# Patient Record
Sex: Female | Born: 1940 | Race: White | Hispanic: No | Smoking: Former smoker
Health system: Southern US, Community
[De-identification: ages and names within clinical notes are randomized; demographics above are authoritative.]

## PROBLEM LIST (undated history)

## (undated) DIAGNOSIS — E119 Type 2 diabetes mellitus without complications: Secondary | ICD-10-CM

## (undated) DIAGNOSIS — S4290XA Fracture of unspecified shoulder girdle, part unspecified, initial encounter for closed fracture: Secondary | ICD-10-CM

## (undated) DIAGNOSIS — G20A1 Parkinson's disease without dyskinesia, without mention of fluctuations: Secondary | ICD-10-CM

## (undated) DIAGNOSIS — J189 Pneumonia, unspecified organism: Secondary | ICD-10-CM

## (undated) DIAGNOSIS — D649 Anemia, unspecified: Secondary | ICD-10-CM

## (undated) DIAGNOSIS — N3281 Overactive bladder: Secondary | ICD-10-CM

## (undated) DIAGNOSIS — F32A Depression, unspecified: Secondary | ICD-10-CM

## (undated) DIAGNOSIS — R29898 Other symptoms and signs involving the musculoskeletal system: Secondary | ICD-10-CM

## (undated) DIAGNOSIS — G20 Parkinson's disease: Secondary | ICD-10-CM

## (undated) DIAGNOSIS — F329 Major depressive disorder, single episode, unspecified: Secondary | ICD-10-CM

## (undated) DIAGNOSIS — E785 Hyperlipidemia, unspecified: Secondary | ICD-10-CM

## (undated) DIAGNOSIS — R2681 Unsteadiness on feet: Secondary | ICD-10-CM

## (undated) DIAGNOSIS — E049 Nontoxic goiter, unspecified: Secondary | ICD-10-CM

## (undated) DIAGNOSIS — U071 COVID-19: Secondary | ICD-10-CM

## (undated) DIAGNOSIS — M1990 Unspecified osteoarthritis, unspecified site: Secondary | ICD-10-CM

## (undated) HISTORY — DX: Parkinson's disease without dyskinesia, without mention of fluctuations: G20.A1

## (undated) HISTORY — DX: Overactive bladder: N32.81

## (undated) HISTORY — DX: Parkinson's disease: G20

## (undated) HISTORY — PX: NECK SURGERY: SHX720

## (undated) HISTORY — DX: Unsteadiness on feet: R26.81

## (undated) HISTORY — DX: Nontoxic goiter, unspecified: E04.9

## (undated) HISTORY — DX: COVID-19: U07.1

## (undated) HISTORY — PX: BLADDER REPAIR: SHX76

## (undated) HISTORY — DX: Depression, unspecified: F32.A

## (undated) HISTORY — DX: Fracture of unspecified shoulder girdle, part unspecified, initial encounter for closed fracture: S42.90XA

## (undated) HISTORY — DX: Major depressive disorder, single episode, unspecified: F32.9

## (undated) HISTORY — DX: Hyperlipidemia, unspecified: E78.5

## (undated) HISTORY — PX: ABDOMINAL HYSTERECTOMY: SHX81

---

## 1978-12-10 HISTORY — PX: ABDOMINAL HYSTERECTOMY: SHX81

## 1978-12-10 HISTORY — PX: BLADDER REPAIR: SHX76

## 2017-03-15 LAB — HM MAMMOGRAPHY

## 2018-11-21 ENCOUNTER — Other Ambulatory Visit: Payer: Self-pay

## 2018-11-21 ENCOUNTER — Emergency Department
Admission: EM | Admit: 2018-11-21 | Discharge: 2018-11-21 | Disposition: A | Discharge status: Home / Self Care | Payer: Medicare Other | Attending: Emergency Medicine | Admitting: Emergency Medicine

## 2018-11-21 ENCOUNTER — Emergency Department: Payer: Medicare Other

## 2018-11-21 DIAGNOSIS — E119 Type 2 diabetes mellitus without complications: Secondary | ICD-10-CM | POA: Diagnosis not present

## 2018-11-21 DIAGNOSIS — Y9389 Activity, other specified: Secondary | ICD-10-CM | POA: Insufficient documentation

## 2018-11-21 DIAGNOSIS — F17210 Nicotine dependence, cigarettes, uncomplicated: Secondary | ICD-10-CM | POA: Insufficient documentation

## 2018-11-21 DIAGNOSIS — S42202A Unspecified fracture of upper end of left humerus, initial encounter for closed fracture: Secondary | ICD-10-CM | POA: Diagnosis not present

## 2018-11-21 DIAGNOSIS — W01198A Fall on same level from slipping, tripping and stumbling with subsequent striking against other object, initial encounter: Secondary | ICD-10-CM | POA: Diagnosis not present

## 2018-11-21 DIAGNOSIS — Y92002 Bathroom of unspecified non-institutional (private) residence single-family (private) house as the place of occurrence of the external cause: Secondary | ICD-10-CM | POA: Diagnosis not present

## 2018-11-21 DIAGNOSIS — Y999 Unspecified external cause status: Secondary | ICD-10-CM | POA: Diagnosis not present

## 2018-11-21 DIAGNOSIS — W19XXXA Unspecified fall, initial encounter: Secondary | ICD-10-CM

## 2018-11-21 DIAGNOSIS — S4992XA Unspecified injury of left shoulder and upper arm, initial encounter: Secondary | ICD-10-CM | POA: Diagnosis present

## 2018-11-21 HISTORY — DX: Unspecified osteoarthritis, unspecified site: M19.90

## 2018-11-21 HISTORY — DX: Type 2 diabetes mellitus without complications: E11.9

## 2018-11-21 LAB — CBC WITH DIFFERENTIAL/PLATELET
Abs Immature Granulocytes: 0.03 10*3/uL (ref 0.00–0.07)
Basophils Absolute: 0 10*3/uL (ref 0.0–0.1)
Basophils Relative: 1 %
Eosinophils Absolute: 0 10*3/uL (ref 0.0–0.5)
Eosinophils Relative: 0 %
HCT: 39.8 % (ref 36.0–46.0)
Hemoglobin: 12.9 g/dL (ref 12.0–15.0)
Immature Granulocytes: 1 %
Lymphocytes Relative: 10 %
Lymphs Abs: 0.7 10*3/uL (ref 0.7–4.0)
MCH: 27.6 pg (ref 26.0–34.0)
MCHC: 32.4 g/dL (ref 30.0–36.0)
MCV: 85 fL (ref 80.0–100.0)
Monocytes Absolute: 0.3 10*3/uL (ref 0.1–1.0)
Monocytes Relative: 4 %
Neutro Abs: 5.4 10*3/uL (ref 1.7–7.7)
Neutrophils Relative %: 84 %
Platelets: 130 10*3/uL — ABNORMAL LOW (ref 150–400)
RBC: 4.68 MIL/uL (ref 3.87–5.11)
RDW: 14.7 % (ref 11.5–15.5)
WBC: 6.4 10*3/uL (ref 4.0–10.5)
nRBC: 0 % (ref 0.0–0.2)

## 2018-11-21 LAB — BASIC METABOLIC PANEL
Anion gap: 8 (ref 5–15)
BUN: 12 mg/dL (ref 8–23)
CO2: 25 mmol/L (ref 22–32)
Calcium: 9 mg/dL (ref 8.9–10.3)
Chloride: 104 mmol/L (ref 98–111)
Creatinine, Ser: 0.59 mg/dL (ref 0.44–1.00)
GFR calc Af Amer: >60 mL/min (ref >60)
GFR calc non Af Amer: >60 mL/min (ref >60)
Glucose, Bld: 146 mg/dL — ABNORMAL HIGH (ref 70–99)
Potassium: 4 mmol/L (ref 3.5–5.1)
Sodium: 137 mmol/L (ref 135–145)

## 2018-11-21 LAB — CK: Total CK: 49 U/L (ref 38–234)

## 2018-11-21 LAB — TROPONIN I: Troponin I: <0.03 ng/mL (ref <0.03)

## 2018-11-21 MED ORDER — MORPHINE SULFATE (PF) 4 MG/ML IV SOLN
4.0000 mg | Freq: Once | INTRAVENOUS | Status: AC
  Administered 2018-11-21: 4 mg via INTRAVENOUS
  Filled 2018-11-21: qty 1

## 2018-11-21 MED ORDER — OXYCODONE-ACETAMINOPHEN 5-325 MG PO TABS: 1.0000 | ORAL_TABLET | ORAL | 0 refills | Status: DC | PRN

## 2018-11-21 MED ORDER — DOCUSATE SODIUM 100 MG PO CAPS: 100.0000 mg | ORAL_CAPSULE | Freq: Every day | ORAL | 2 refills | Status: DC | PRN

## 2018-11-21 NOTE — Discharge Instructions (Addendum)
You have a fracture of your humerus bone, wear the splint and follow-up please with orthopedic surgeon first thing on Monday.  Take the pain medications as needed.  Do not drink or drive on pain medications be where pain medications can make you woozy.  We have offered to try to help establish home health care for you but you would prefer not to have that at this time which is certainly your choice.  We would recommend that you stay with family.  Please return to the emergency room for any new or worrisome symptoms.  Follow closely with primary care as well if you do not have one please call for the primary care number listed above

## 2018-11-21 NOTE — ED Triage Notes (Signed)
Pt from via ems for fall. Pt states she has problem walking like she is "running forward" and this morning "couldn't stop" herself.  Pain in left shoulder, 50 mcg fentanyl from ems. No obvious deformity per EMS. Pt aox4.  Pt denies blood thinner/hitting head during fall.

## 2018-11-21 NOTE — ED Notes (Signed)
Patient transported to X-ray 

## 2018-11-21 NOTE — ED Provider Notes (Addendum)
[facility] Emergency Department Provider Note  ____________________________________________   I have reviewed the triage vital signs and the nursing notes. Where available I have reviewed prior notes and, if possible and indicated, outside hospital notes.    HISTORY  Chief Complaint Fall    HPI Whitney [name] is a 77 y.o. female who presents today complaining of having fallen at home.  She has she states poor balance at baseline, she tripped and fell landing on her left shoulder.  She did not hit her head she did not pass out this was not a syncopal event she has no chest pain or shortness of breath no nausea no vomiting.  She has trouble with generalized weakness and lives by herself and was able to get off the floor, she was on the floor around 3 AM.  She denies any other injury, aside from her left shoulder which is painful.  Patient has received fentanyl from EMS.  No other complaints.  The pain is worse when she moves her arm, worse when she touches it nothing else makes it better or worse no other alleviating or aggravating factors no prior treatment no other complaints    Past Medical History:  Diagnosis Date  . Arthritis   . Diabetes mellitus without complication (HCC)     There are no active problems to display for this patient.   Past Surgical History:  Procedure Laterality Date  . ABDOMINAL HYSTERECTOMY    . NECK SURGERY      Prior to Admission medications   Not on File    Allergies Patient has no known allergies.  No family history on file.  Social History Social History   Tobacco Use  . Smoking status: Current Some Day Smoker    Packs/day: 0.10    Types: Cigarettes  . Smokeless tobacco: Never Used  Substance Use Topics  . Alcohol use: Never    Frequency: Never  . Drug use: Never    Review of Systems Constitutional: No fever/chills Eyes: No visual changes. ENT: No sore throat. No stiff neck no neck pain Cardiovascular:  Denies chest pain. Respiratory: Denies shortness of breath. Gastrointestinal:   no vomiting.  No diarrhea.  No constipation. Genitourinary: Negative for dysuria. Musculoskeletal: Negative lower extremity swelling Skin: Negative for rash. Neurological: Negative for severe headaches, focal weakness or numbness.   ____________________________________________   PHYSICAL EXAM:  VITAL SIGNS: ED Triage Vitals  Enc Vitals Group     BP 11/21/18 0948 (!) 152/96     Pulse Rate 11/21/18 0948 85     Resp 11/21/18 0948 20     Temp 11/21/18 0940 98 F (36.7 C)     Temp Source 11/21/18 0940 Oral     SpO2 11/21/18 0937 95 %     Weight 11/21/18 0940 165 lb (74.8 kg)     Height 11/21/18 0940 5\' 2"  (1.575 m)     Head Circumference --      Peak Flow --      Pain Score 11/21/18 0940 8     Pain Loc --      Pain Edu? --      Excl. in GC? --     Constitutional: Alert and oriented. Well appearing and in no acute distress. Eyes: Conjunctivae are normal Head: Atraumatic HEENT: No congestion/rhinnorhea. Mucous membranes are moist.  Oropharynx non-erythematous Neck:   Nontender with no meningismus, no masses, no stridor Cardiovascular: Normal rate, regular rhythm. Grossly normal heart sounds.  Good peripheral circulation. Respiratory:  Normal respiratory effort.  No retractions. Lungs CTAB. Abdominal: Soft and nontender. No distention. No guarding no rebound Back:  There is no focal tenderness or step off.  there is no midline tenderness there are no lesions noted. there is no CVA tenderness Musculoskeletal: No lower extremity tenderness, no deformity noted, to the left elbow or wrist or shoulder however it is tender to palpate on the humeral head.  There is no evidence of dislocation.  I can range it but it hurts.  She has good pulses compartments are soft, I do not detect any clavicular pain.  However, the pain that she describes is very reproducible with palpation.  No joint effusions, no DVT signs  strong distal pulses no edema Neurologic:  Normal speech and language. No gross focal neurologic deficits are appreciated.  Skin:  Skin is warm, dry and intact. No rash noted. Psychiatric: Mood and affect are normal. Speech and behavior are normal.  ____________________________________________   LABS (all labs ordered are listed, but only abnormal results are displayed)  Labs Reviewed  BASIC METABOLIC PANEL  CBC WITH DIFFERENTIAL/PLATELET  TROPONIN I  CK    Pertinent labs  results that were available during my care of the patient were reviewed by me and considered in my medical decision making (see chart for details). ____________________________________________  EKG  I personally interpreted any EKGs ordered by me or triage Sinus rhythm rate 77 bpm no acute ST elevation or depression, no no acute ischemic changes ____________________________________________  RADIOLOGY  Pertinent labs & imaging results that were available during my care of the patient were reviewed by me and considered in my medical decision making (see chart for details). If possible, patient and/or family made aware of any abnormal findings.  No results found. ____________________________________________    PROCEDURES  Procedure(s) performed: None  Procedures  Critical Care performed: None  ____________________________________________   INITIAL IMPRESSION / ASSESSMENT AND PLAN / ED COURSE  Pertinent labs & imaging results that were available during my care of the patient were reviewed by me and considered in my medical decision making (see chart for details).  Patient here with a non-syncopal fall with shoulder pain thereafter because she was on the ground for a while we will check a total CK, do not think this versus referred cardiac pain, and certainly is quite reproducible and associated with a joint we will obtain a x-ray of the shoulder and EKG as a precaution.  We will give her pain  medications, patient is in no acute distress otherwise and at her baseline according to family.  There is no indication that she is hit her head 7 hours ago, there is no evidence that she has a neck injury, I will avoid CT scan in this patient who is not on blood thinners  ----------------------------------------- 11:16 AM on 11/21/2018 -----------------------------------------  Patient is feeling much better after pain medication, she does have a proximal humerus fracture which is not likely going to require surgical intervention, will have her follow-up with orthopedic surgery, we are placing her in a sling.  No other injury noted no evidence of rhabdomyolysis, patient awake and alert.    ____________________________________________   FINAL CLINICAL IMPRESSION(S) / ED DIAGNOSES  Final diagnoses:  None      This chart was dictated using voice recognition software.  Despite best efforts to proofread,  errors can occur which can change meaning.      Jeanmarie PlantMcShane, Perris Conwell A, MD 11/21/18 1014    Jeanmarie PlantMcShane, Jolynne Spurgin A, MD 11/21/18  1117  

## 2019-01-13 ENCOUNTER — Encounter: Payer: Self-pay | Admitting: Family Medicine

## 2019-01-13 ENCOUNTER — Ambulatory Visit (INDEPENDENT_AMBULATORY_CARE_PROVIDER_SITE_OTHER): Payer: Medicare Other | Admitting: Family Medicine

## 2019-01-13 VITALS — BP 130/85 | HR 81 | Temp 98.2°F | Resp 16 | Ht 59.5 in | Wt 165.2 lb

## 2019-01-13 DIAGNOSIS — N3281 Overactive bladder: Secondary | ICD-10-CM | POA: Diagnosis not present

## 2019-01-13 DIAGNOSIS — M7989 Other specified soft tissue disorders: Secondary | ICD-10-CM | POA: Diagnosis not present

## 2019-01-13 DIAGNOSIS — E1169 Type 2 diabetes mellitus with other specified complication: Secondary | ICD-10-CM | POA: Insufficient documentation

## 2019-01-13 DIAGNOSIS — E669 Obesity, unspecified: Secondary | ICD-10-CM | POA: Insufficient documentation

## 2019-01-13 DIAGNOSIS — Z5181 Encounter for therapeutic drug level monitoring: Secondary | ICD-10-CM

## 2019-01-13 DIAGNOSIS — R269 Unspecified abnormalities of gait and mobility: Secondary | ICD-10-CM

## 2019-01-13 DIAGNOSIS — E049 Nontoxic goiter, unspecified: Secondary | ICD-10-CM | POA: Diagnosis not present

## 2019-01-13 DIAGNOSIS — H5789 Other specified disorders of eye and adnexa: Secondary | ICD-10-CM

## 2019-01-13 DIAGNOSIS — H00012 Hordeolum externum right lower eyelid: Secondary | ICD-10-CM

## 2019-01-13 DIAGNOSIS — R350 Frequency of micturition: Secondary | ICD-10-CM

## 2019-01-13 MED ORDER — BUPROPION HCL ER (XL) 150 MG PO TB24: 150.0000 mg | ORAL_TABLET | Freq: Every day | ORAL | 1 refills | Status: DC

## 2019-01-13 NOTE — Assessment & Plan Note (Addendum)
I am here to help her lose in the future, but explained not our focus today on first visit

## 2019-01-13 NOTE — Assessment & Plan Note (Signed)
Foot exam by MD today; check A1c, urine microalb:Cr; refer to eye doctor

## 2019-01-13 NOTE — Patient Instructions (Addendum)
We'll get an ultrasound, we'll get labs We'll have you see the bladder specialist and goiter specialist If you have not heard anything from my staff in a week about any orders/referrals/studies from today, please contact us here to follow-up [phone]  Check out the information at [email or web address] entitled "Nutrition for Weight Loss: What You Need to Know about Fad Diets" Try to lose between 1-2 pounds per week by taking in fewer calories and burning off more calories You can succeed by limiting portions, limiting foods dense in calories and fat, becoming more active, and drinking 8 glasses of water a day (64 ounces) Don't skip meals, especially breakfast, as skipping meals may alter your metabolism Do not use over-the-counter weight loss pills or gimmicks that claim rapid weight loss A healthy BMI (or body mass index) is between 18.5 and 24.9 You can calculate your ideal BMI at the NIH website JobE[employer]ics.hu   Obesity, Adult Obesity is the condition of having too much total body fat. Being overweight or obese means that your weight is greater than what is considered healthy for your body size. Obesity is determined by a measurement called BMI. BMI is an estimate of body fat and is calculated from height and weight. For adults, a BMI of 30 or higher is considered obese. Obesity can eventually lead to other health concerns and major illnesses, including:  Stroke.  Coronary artery disease (CAD).  Type 2 diabetes.  Some types of cancer, including cancers of the colon, breast, uterus, and gallbladder.  Osteoarthritis.  High blood pressure (hypertension).  High cholesterol.  Sleep apnea.  Gallbladder stones.  Infertility problems. What are the causes? The main cause of obesity is taking in (consuming) more calories than your body uses for energy. Other factors that contribute to this condition may include:  Being born with  genes that make you more likely to become obese.  Having a medical condition that causes obesity. These conditions include: ? Hypothyroidism. ? Polycystic ovarian syndrome (PCOS). ? Binge-eating disorder. ? Cushing syndrome.  Taking certain medicines, such as steroids, antidepressants, and seizure medicines.  Not being physically active (sedentary lifestyle).  Living where there are limited places to exercise safely or buy healthy foods.  Not getting enough sleep. What increases the risk? The following factors may increase your risk of this condition:  Having a family history of obesity.  Being a woman of African-American descent.  Being a man of Hispanic descent. What are the signs or symptoms? Having excessive body fat is the main symptom of this condition. How is this diagnosed? This condition may be diagnosed based on:  Your symptoms.  Your medical history.  A physical exam. Your health care provider may measure: ? Your BMI. If you are an adult with a BMI between 25 and less than 30, you are considered overweight. If you are an adult with a BMI of 30 or higher, you are considered obese. ? The distances around your hips and your waist (circumferences). These may be compared to each other to help diagnose your condition. ? Your skinfold thickness. Your health care provider may gently pinch a fold of your skin and measure it. How is this treated? Treatment for this condition often includes changing your lifestyle. Treatment may include some or all of the following:  Dietary changes. Work with your health care provider and a dietitian to set a weight-loss goal that is healthy and reasonable for you. Dietary changes may include eating: ? Smaller portions. A portion size is the  amount of a particular food that is healthy for you to eat at one time. This varies from person to person. ? Low-calorie or low-fat options. ? More whole grains, fruits, and vegetables.  Regular  physical activity. This may include aerobic activity (cardio) and strength training.  Medicine to help you lose weight. Your health care provider may prescribe medicine if you are unable to lose 1 pound a week after 6 weeks of eating more healthily and doing more physical activity.  Surgery. Surgical options may include gastric banding and gastric bypass. Surgery may be done if: ? Other treatments have not helped to improve your condition. ? You have a BMI of 40 or higher. ? You have life-threatening health problems related to obesity. Follow these instructions at home:  Eating and drinking   Follow recommendations from your health care provider about what you eat and drink. Your health care provider may advise you to: ? Limit fast foods, sweets, and processed snack foods. ? Choose low-fat options, such as low-fat milk instead of whole milk. ? Eat 5 or more servings of fruits or vegetables every day. ? Eat at home more often. This gives you more control over what you eat. ? Choose healthy foods when you eat out. ? Learn what a healthy portion size is. ? Keep low-fat snacks on hand. ? Avoid sugary drinks, such as soda, fruit juice, iced tea sweetened with sugar, and flavored milk. ? Eat a healthy breakfast.  Drink enough water to keep your urine clear or pale yellow.  Do not go without eating for long periods of time (do not fast) or follow a fad diet. Fasting and fad diets can be unhealthy and even dangerous. Physical Activity  Exercise regularly, as told by your health care provider. Ask your health care provider what types of exercise are safe for you and how often you should exercise.  Warm up and stretch before being active.  Cool down and stretch after being active.  Rest between periods of activity. Lifestyle  Limit the time that you spend in front of your TV, computer, or video game system.  Find ways to reward yourself that do not involve food.  Limit alcohol intake  to no more than 1 drink a day for nonpregnant women and 2 drinks a day for men. One drink equals 12 oz of beer, 5 oz of wine, or 1 oz of hard liquor. General instructions  Keep a weight loss journal to keep track of the food you eat and how much you exercise you get.  Take over-the-counter and prescription medicines only as told by your health care provider.  Take vitamins and supplements only as told by your health care provider.  Consider joining a support group. Your health care provider may be able to recommend a support group.  Keep all follow-up visits as told by your health care provider. This is important. Contact a health care provider if:  You are unable to meet your weight loss goal after 6 weeks of dietary and lifestyle changes. This information is not intended to replace advice given to you by your health care provider. Make sure you discuss any questions you have with your health care provider. Document Released: 01/03/2005 Document Revised: 04/30/2016 Document Reviewed: 09/14/2015 Elsevier Interactive Patient Education  2019 Elsevier Inc.  Preventing Unhealthy Kinder Morgan EnergyWeight Gain, Adult Staying at a healthy weight is important to your overall health. When fat builds up in your body, you may become overweight or obese. Being overweight or  obese increases your risk of developing certain health problems, such as heart disease, diabetes, sleeping problems, joint problems, and some types of cancer. Unhealthy weight gain is often the result of making unhealthy food choices or not getting enough exercise. You can make changes to your lifestyle to prevent obesity and stay as healthy as possible. What nutrition changes can be made?   Eat only as much as your body needs. To do this: ? Pay attention to signs that you are hungry or full. Stop eating as soon as you feel full. ? If you feel hungry, try drinking water first before eating. Drink enough water so your urine is clear or pale  yellow. ? Eat smaller portions. Pay attention to portion sizes when eating out. ? Look at serving sizes on food labels. Most foods contain more than one serving per container. ? Eat the recommended number of calories for your gender and activity level. For most active people, a daily total of 2,000 calories is appropriate. If you are trying to lose weight or are not very active, you may need to eat fewer calories. Talk with your health care provider or a diet and nutrition specialist (dietitian) about how many calories you need each day.  Choose healthy foods, such as: ? Fruits and vegetables. At each meal, try to fill at least half of your plate with fruits and vegetables. ? Whole grains, such as whole-wheat bread, brown rice, and quinoa. ? Lean meats, such as chicken or fish. ? Other healthy proteins, such as beans, eggs, or tofu. ? Healthy fats, such as nuts, seeds, fatty fish, and olive oil. ? Low-fat or fat-free dairy products.  Check food labels, and avoid food and drinks that: ? Are high in calories. ? Have added sugar. ? Are high in sodium. ? Have saturated fats or trans fats.  Cook foods in healthier ways, such as by baking, broiling, or grilling.  Make a meal plan for the week, and shop with a grocery list to help you stay on track with your purchases. Try to avoid going to the grocery store when you are hungry.  When grocery shopping, try to shop around the outside of the store first, where the fresh foods are. Doing this helps you to avoid prepackaged foods, which can be high in sugar, salt (sodium), and fat. What lifestyle changes can be made?   Exercise for 30 or more minutes on 5 or more days each week. Exercising may include brisk walking, yard work, biking, running, swimming, and team sports like basketball and soccer. Ask your health care provider which exercises are safe for you.  Do muscle-strengthening activities, such as lifting weights or using resistance bands, on  2 or more days a week.  Do not use any products that contain nicotine or tobacco, such as cigarettes and e-cigarettes. If you need help quitting, ask your health care provider.  Limit alcohol intake to no more than 1 drink a day for nonpregnant women and 2 drinks a day for men. One drink equals 12 oz of beer, 5 oz of wine, or 1 oz of hard liquor.  Try to get 7-9 hours of sleep each night. What other changes can be made?  Keep a food and activity journal to keep track of: ? What you ate and how many calories you had. Remember to count the calories in sauces, dressings, and side dishes. ? Whether you were active, and what exercises you did. ? Your calorie, weight, and activity goals.  Check your weight regularly. Track any changes. If you notice you have gained weight, make changes to your diet or activity routine.  Avoid taking weight-loss medicines or supplements. Talk to your health care provider before starting any new medicine or supplement.  Talk to your health care provider before trying any new diet or exercise plan. Why are these changes important? Eating healthy, staying active, and having healthy habits can help you to prevent obesity. Those changes also:  Help you manage stress and emotions.  Help you connect with friends and family.  Improve your self-esteem.  Improve your sleep.  Prevent long-term health problems. What can happen if changes are not made? Being obese or overweight can cause you to develop joint or bone problems, which can make it hard for you to stay active or do activities you enjoy. Being obese or overweight also puts stress on your heart and lungs and can lead to health problems like diabetes, heart disease, and some cancers. Where to find more information Talk with your health care provider or a dietitian about healthy eating and healthy lifestyle choices. You may also find information from:  U.[place]. Department of Agriculture, MyPlate:  https://ball-collins.biz/  American Heart Association: www.heart.org  Centers for Disease Control and Prevention: FootballExhibition.com.br Summary  Staying at a healthy weight is important to your overall health. It helps you to prevent certain diseases and health problems, such as heart disease, diabetes, joint problems, sleep disorders, and some types of cancer.  Being obese or overweight can cause you to develop joint or bone problems, which can make it hard for you to stay active or do activities you enjoy.  You can prevent unhealthy weight gain by eating a healthy diet, exercising regularly, not smoking, limiting alcohol, and getting enough sleep.  Talk with your health care provider or a dietitian for guidance about healthy eating and healthy lifestyle choices. This information is not intended to replace advice given to you by your health care provider. Make sure you discuss any questions you have with your health care provider. Document Released: 11/27/2016 Document Revised: 09/06/2017 Document Reviewed: 01/02/2017 Elsevier Interactive Patient Education  2019 [employer].

## 2019-01-13 NOTE — Progress Notes (Signed)
BP 130/85   Pulse 81   Temp 98.2 F (36.8 C) (Oral)   Resp 16   Ht 4' 11.5" (1.511 m)   Wt 165 lb 3.2 oz (74.9 kg)   SpO2 99%   BMI 32.81 kg/m    Subjective:    Patient ID: Whitney [name], female    DOB: August 17, 1941, 78 y.o.   MRN: [id number]  HPI: Whitney [name] is a 78 y.o. female  Chief Complaint  Patient presents with  . New Patient (Initial Visit)  . Medication Refill  . Urinary Frequency    states current urine pill not working    HPI Patient is here to establish care; she moved here recently from [facility]; I do not have her outside records; her sister is my patient  Type 2 diabetes; controlled; had it for 15 years; father and sisters with diabetes Neuropathy; is not aware of any kidney damage or eye damage from her diabetes "Slobber from goiter" but no dry mouth; on metformin  Goiter; saw specialist, was going to do surgery but husband was sick at the time; no pain; slobbering some; some trouble swallowing; no weight gain, no trouble with BM; no blood in the urine; colonoscopy UTD  Overactive bladder; has not been to urologist; urinary frequency and incontinence; not sure the medicine is helping  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 Ernest Pine  Depression screen [facility] 2/9 01/13/2019  Decreased Interest 3  Down, Depressed, Hopeless 2  PHQ - 2 Score 5  Altered sleeping 0  Tired, decreased energy 3    Change in appetite 0  Feeling bad or failure about yourself  0  Trouble concentrating 0  Moving slowly or fidgety/restless 0  Suicidal thoughts 0  PHQ-9 Score 8  Difficult doing work/chores Not difficult at all   Fall Risk  01/13/2019  Falls in the past year? 1  Number falls in past yr: 1  Injury with Fall? 1    Relevant past medical, surgical, family and social history reviewed Past Medical History:  Diagnosis Date  . Arthritis   . Depression   . Diabetes mellitus without complication (HCC)   . Goiter   . Overactive bladder   . Unsteady gait    Past Surgical History:  Procedure Laterality Date  . ABDOMINAL HYSTERECTOMY    . BLADDER REPAIR    . NECK SURGERY     Family History  Problem Relation Age of Onset  . Stroke Mother   . Hypertension Mother   . Diabetes Father   . Heart disease Father   . Lung cancer Father   . Colon cancer Sister   . Diabetes Sister   . Kidney disease Daughter   . Kidney cancer Daughter   . Pulmonary embolism Sister   . Diabetes Sister   . Kidney disease Sister   . Heart disease Sister   .  Atrial fibrillation Sister   . Diabetes Brother   . Heart disease Brother   . AAA (abdominal aortic aneurysm) Brother   . Cancer Brother        skin   Social History   Tobacco Use  . Smoking status: Current Some Day Smoker    Types: Cigarettes  . Smokeless tobacco: Never Used  . Tobacco comment: less than 1 a day  Substance Use Topics  . Alcohol use: Never    Frequency: Never  . Drug use: Never     Office Visit from 01/13/2019 in [facility]  AUDIT-C Score  0      Interim medical history since last visit reviewed. Allergies and medications reviewed  Review of Systems  Constitutional: Negative for unexpected weight change.  Gastrointestinal: Negative for blood in stool.  Genitourinary: Negative for hematuria.       Strong urine odor; incontinence   Per HPI unless specifically indicated above     Objective:     BP 130/85   Pulse 81   Temp 98.2 F (36.8 C) (Oral)   Resp 16   Ht 4' 11.5" (1.511 m)   Wt 165 lb 3.2 oz (74.9 kg)   SpO2 99%   BMI 32.81 kg/m   Wt Readings from Last 3 Encounters:  01/13/19 165 lb 3.2 oz (74.9 kg)  11/21/18 165 lb (74.8 kg)    Physical Exam Constitutional:      General: She is not in acute distress.    Appearance: She is well-developed. She is obese. She is not diaphoretic.  HENT:     Head: Normocephalic and atraumatic.  Eyes:     General: No scleral icterus. Neck:     Thyroid: Thyromegaly (extensive, left side) present.   Cardiovascular:     Rate and Rhythm: Normal rate and regular rhythm.     Heart sounds: Normal heart sounds. No murmur.  Pulmonary:     Effort: Pulmonary effort is normal. No respiratory distress.     Breath sounds: Normal breath sounds. No wheezing.  Abdominal:     General: Bowel sounds are normal. There is no distension.     Palpations: Abdomen is soft.  Musculoskeletal:     Left upper arm: She exhibits edema (mild). She exhibits no deformity.  Feet:     Right foot:     Protective Sensation: 5 sites tested. 3 sites sensed.     Left foot:     Protective Sensation: 5 sites tested. 4 sites sensed.  Skin:    General: Skin is warm and dry.     Coloration: Skin is not pale.          Comments: Soft mass upper LEFT side back, smaller than examiner's palm  Neurological:     Mental Status: She is alert.  Psychiatric:        Behavior: Behavior normal.        Thought Content: Thought content normal.        Judgment: Judgment normal.     Comments: Good eye contact with examiner but little spontaneous facial expression     Results for orders placed or performed during the hospital encounter of 11/21/18  Basic metabolic panel  Result Value Ref Range   Sodium 137 135 - 145 mmol/L   Potassium 4.0 3.5 - 5.1 mmol/L   Chloride 104 98 - 111 mmol/L   CO2 25 22 - 32 mmol/L   Glucose, Bld 146 (H) 70 - 99 mg/dL  BUN 12 8 - 23 mg/dL    Creatinine, Ser 9.810.59 0.44 - 1.00 mg/dL   Calcium 9.0 8.9 - 19.110.3 mg/dL   GFR calc non Af Amer >60 >60 mL/min   GFR calc Af Amer >60 >60 mL/min   Anion gap 8 5 - 15  CBC with Differential  Result Value Ref Range   WBC 6.4 4.0 - 10.5 K/uL   RBC 4.68 3.87 - 5.11 MIL/uL   Hemoglobin 12.9 12.0 - 15.0 g/dL   HCT 47.839.8 29.536.0 - 62.146.0 %   MCV 85.0 80.0 - 100.0 fL   MCH 27.6 26.0 - 34.0 pg   MCHC 32.4 30.0 - 36.0 g/dL   RDW 30.814.7 65.711.5 - 84.615.5 %   Platelets 130 (L) 150 - 400 K/uL   nRBC 0.0 0.0 - 0.2 %   Neutrophils Relative % 84 %   Neutro Abs 5.4 1.7 - 7.7 K/uL   Lymphocytes Relative 10 %   Lymphs Abs 0.7 0.7 - 4.0 K/uL   Monocytes Relative 4 %   Monocytes Absolute 0.3 0.1 - 1.0 K/uL   Eosinophils Relative 0 %   Eosinophils Absolute 0.0 0.0 - 0.5 K/uL   Basophils Relative 1 %   Basophils Absolute 0.0 0.0 - 0.1 K/uL   Smear Review HIDE    Immature Granulocytes 1 %   Abs Immature Granulocytes 0.03 0.00 - 0.07 K/uL  Troponin I - Once  Result Value Ref Range   Troponin I <0.03 <0.03 ng/mL  CK  Result Value Ref Range   Total CK 49 38 - 234 U/L      Assessment & Plan:   Problem List Items Addressed This Visit      Endocrine   Goiter (Chronic)    Refer to ENT      Relevant Orders   Ambulatory referral to ENT   TSH   Diabetes mellitus type 2 in obese (HCC) (Chronic)    Foot exam by MD today; check A1c, urine microalb:Cr; refer to eye doctor      Relevant Orders   Ambulatory referral to Podiatry   Lipid panel   Hemoglobin A1c   COMPLETE METABOLIC PANEL WITH GFR   Microalbumin / creatinine urine ratio   Ambulatory referral to Ophthalmology     Genitourinary   Overactive bladder - Primary    Refer to urologist; check urine today; not sure if spinal cord involvement      Relevant Orders   Ambulatory referral to Urology     Other   Obesity (BMI 30.0-34.9) (Chronic)    I am here to help her lose in the future, but explained not our focus today on first visit       Medication monitoring encounter    Check liver and kidneys      Relevant Orders   COMPLETE METABOLIC PANEL WITH GFR   Gait abnormality    Check B12, order MRI lumbar spine to r/o spinal stenosis; they don't want to go now, after arm gets better      Relevant Orders   Vitamin B12    Other Visit Diagnoses    Soft tissue mass       probably a lipoma; getting US   Relevant Orders   US Soft Tissue Head/Neck   Urinary frequency       Relevant Orders   Urinalysis w microscopic + reflex cultur   Hordeolum externum of right lower eyelid       Eye irritation  Relevant Orders   Ambulatory referral to Ophthalmology       Follow up plan: Return in about 1 month (around 02/11/2019) for follow-up visit with Dr. Sherie DonLada: medicare wellness when [facility]  An after-visit summary was printed and given to the patient at check-out.  Please see the patient instructions which may contain other information and recommendations beyond what is mentioned above in the assessment and plan.  Meds ordered this encounter  Medications  . buPROPion (WELLBUTRIN XL) 150 MG 24 hr tablet    Sig: Take 1 tablet (150 mg total) by mouth daily.    Dispense:  30 tablet    Refill:  1    Orders Placed This Encounter  Procedures  . US Soft Tissue Head/Neck  . Vitamin B12  . Lipid panel  . Hemoglobin A1c  . COMPLETE METABOLIC PANEL WITH GFR  . Microalbumin / creatinine urine ratio  . TSH  . Urinalysis w microscopic + reflex cultur  . Ambulatory referral to Urology  . Ambulatory referral to Podiatry  . Ambulatory referral to ENT  . Ambulatory referral to Ophthalmology

## 2019-01-13 NOTE — Assessment & Plan Note (Signed)
Refer to urologist; check urine today; not sure if spinal cord involvement

## 2019-01-13 NOTE — Assessment & Plan Note (Signed)
Check liver and kidneys 

## 2019-01-13 NOTE — Assessment & Plan Note (Signed)
Refer to ENT

## 2019-01-13 NOTE — Assessment & Plan Note (Signed)
Check B12, order MRI lumbar spine to r/o spinal stenosis; they don't want to go now, after arm gets better

## 2019-01-14 ENCOUNTER — Other Ambulatory Visit: Payer: Self-pay | Admitting: Family Medicine

## 2019-01-14 DIAGNOSIS — E782 Mixed hyperlipidemia: Secondary | ICD-10-CM

## 2019-01-14 MED ORDER — ATORVASTATIN CALCIUM 40 MG PO TABS: 40.0000 mg | ORAL_TABLET | Freq: Every day | ORAL | 1 refills | Status: DC

## 2019-01-14 NOTE — Progress Notes (Signed)
Start high intensity statin Check lipids in 6 weeks

## 2019-01-14 NOTE — Progress Notes (Signed)
Whitney [name], please let the patient know that her vitamin B12 is normal Her cholesterol is very high and her risk of having a heart attack is almost 50% in the next 10 years, so we really need to bring her cholesterol down; start 40 mg atorvastatin and recheck lipids in 6 weeks (I'll order) Diabetes is controlled; work on weight loss to help keep this from progressively getting worse Urine culture pending  The 10-year ASCVD risk score Denman George DC Montez Hageman., et al., 2013) is: 46.6%   Values used to calculate the score:     Age: 78 years     Sex: Female     Is Non-Hispanic African American: No     Diabetic: Yes     Tobacco smoker: Yes     Systolic Blood Pressure: 130 mmHg     Is BP treated: No     HDL Cholesterol: 65 mg/dL     Total Cholesterol: 209 mg/dL

## 2019-01-15 DIAGNOSIS — E785 Hyperlipidemia, unspecified: Secondary | ICD-10-CM | POA: Insufficient documentation

## 2019-01-15 DIAGNOSIS — G20 Parkinson's disease: Secondary | ICD-10-CM | POA: Insufficient documentation

## 2019-01-15 DIAGNOSIS — R5383 Other fatigue: Secondary | ICD-10-CM | POA: Insufficient documentation

## 2019-01-15 LAB — LIPID PANEL
Cholesterol: 209 mg/dL — ABNORMAL HIGH (ref <200)
HDL: 65 mg/dL (ref >=50)
LDL Cholesterol (Calc): 123 mg/dL (calc) — ABNORMAL HIGH
Non-HDL Cholesterol (Calc): 144 mg/dL (calc) — ABNORMAL HIGH (ref <130)
Total CHOL/HDL Ratio: 3.2 (calc) (ref <5.0)
Triglycerides: 104 mg/dL (ref <150)

## 2019-01-15 LAB — COMPLETE METABOLIC PANEL WITH GFR
AG Ratio: 1.5 (calc) (ref 1.0–2.5)
ALT: 25 U/L (ref 6–29)
AST: 28 U/L (ref 10–35)
Albumin: 4 g/dL (ref 3.6–5.1)
Alkaline phosphatase (APISO): 111 U/L (ref 37–153)
BUN: 14 mg/dL (ref 7–25)
CO2: 30 mmol/L (ref 20–32)
Calcium: 9.3 mg/dL (ref 8.6–10.4)
Chloride: 103 mmol/L (ref 98–110)
Creat: 0.64 mg/dL (ref 0.60–0.93)
GFR, Est African American: 100 mL/min/{1.73_m2} (ref >=60)
GFR, Est Non African American: 86 mL/min/{1.73_m2} (ref >=60)
Globulin: 2.7 g/dL (calc) (ref 1.9–3.7)
Glucose, Bld: 146 mg/dL — ABNORMAL HIGH (ref 65–99)
Potassium: 4.1 mmol/L (ref 3.5–5.3)
Sodium: 141 mmol/L (ref 135–146)
Total Bilirubin: 0.8 mg/dL (ref 0.2–1.2)
Total Protein: 6.7 g/dL (ref 6.1–8.1)

## 2019-01-15 LAB — URINE CULTURE
MICRO NUMBER:: 154042
Result:: NO GROWTH
SPECIMEN QUALITY:: ADEQUATE

## 2019-01-15 LAB — MICROALBUMIN / CREATININE URINE RATIO
Creatinine, Urine: 99 mg/dL (ref 20–275)
Microalb Creat Ratio: 22 mcg/mg creat (ref <30)
Microalb, Ur: 2.2 mg/dL

## 2019-01-15 LAB — URINALYSIS W MICROSCOPIC + REFLEX CULTURE
Bacteria, UA: NONE SEEN /HPF
Bilirubin Urine: NEGATIVE
Glucose, UA: NEGATIVE
Hgb urine dipstick: NEGATIVE
Hyaline Cast: NONE SEEN /LPF
Ketones, ur: NEGATIVE
Nitrites, Initial: NEGATIVE
Protein, ur: NEGATIVE
Specific Gravity, Urine: 1.018 (ref 1.001–1.03)
pH: <=5 (ref 5.0–8.0)

## 2019-01-15 LAB — HEMOGLOBIN A1C
Hgb A1c MFr Bld: 6.7 % of total Hgb — ABNORMAL HIGH (ref <5.7)
Mean Plasma Glucose: 146 (calc)
eAG (mmol/L): 8.1 (calc)

## 2019-01-15 LAB — CULTURE INDICATED

## 2019-01-15 LAB — VITAMIN B12: Vitamin B-12: 674 pg/mL (ref 200–1100)

## 2019-01-15 LAB — TSH: TSH: 1.27 mIU/L (ref 0.40–4.50)

## 2019-01-15 NOTE — Progress Notes (Signed)
Whitney [name], please let the patient know that her urine culture did not grow out anything; please see all of the other results as well

## 2019-01-19 ENCOUNTER — Telehealth: Payer: Self-pay | Admitting: Family Medicine

## 2019-01-19 NOTE — Telephone Encounter (Signed)
Pt. Given results and instructions. Verbalizes understanding.Will start medication and return in 6 weeks for lab work.

## 2019-01-20 ENCOUNTER — Ambulatory Visit
Admission: RE | Admit: 2019-01-20 | Discharge: 2019-01-20 | Disposition: A | Discharge status: Home / Self Care | Payer: Medicare Other | Source: Ambulatory Visit | Attending: Family Medicine | Admitting: Family Medicine

## 2019-01-20 DIAGNOSIS — M7989 Other specified soft tissue disorders: Secondary | ICD-10-CM

## 2019-01-21 ENCOUNTER — Encounter: Payer: Self-pay | Admitting: Family Medicine

## 2019-01-21 DIAGNOSIS — R222 Localized swelling, mass and lump, trunk: Secondary | ICD-10-CM | POA: Insufficient documentation

## 2019-01-21 NOTE — Progress Notes (Signed)
Whitney [name], please let the patient know that the ultrasound did not show any obvious cyst; the muscles in this area though look thickened; the next step is to get an MRI of the area to look closer; if she doesn't have a contraindiction, please ORDER MRI with contrast, soft tissue back

## 2019-01-29 ENCOUNTER — Other Ambulatory Visit: Payer: Self-pay | Admitting: Otolaryngology

## 2019-01-29 ENCOUNTER — Other Ambulatory Visit (HOSPITAL_COMMUNITY): Payer: Self-pay | Admitting: Otolaryngology

## 2019-01-29 DIAGNOSIS — E049 Nontoxic goiter, unspecified: Secondary | ICD-10-CM

## 2019-02-03 ENCOUNTER — Ambulatory Visit
Admission: RE | Admit: 2019-02-03 | Discharge: 2019-02-03 | Disposition: A | Discharge status: Home / Self Care | Payer: Medicare Other | Source: Ambulatory Visit | Attending: Otolaryngology | Admitting: Otolaryngology

## 2019-02-03 DIAGNOSIS — E049 Nontoxic goiter, unspecified: Secondary | ICD-10-CM | POA: Diagnosis not present

## 2019-02-06 ENCOUNTER — Other Ambulatory Visit: Payer: Self-pay | Admitting: Otolaryngology

## 2019-02-06 DIAGNOSIS — R221 Localized swelling, mass and lump, neck: Secondary | ICD-10-CM

## 2019-02-10 ENCOUNTER — Ambulatory Visit (INDEPENDENT_AMBULATORY_CARE_PROVIDER_SITE_OTHER): Payer: Medicare Other | Admitting: Family Medicine

## 2019-02-10 ENCOUNTER — Encounter: Payer: Self-pay | Admitting: Family Medicine

## 2019-02-10 VITALS — BP 118/70 | HR 71 | Temp 98.1°F | Ht 60.0 in | Wt 165.6 lb

## 2019-02-10 DIAGNOSIS — R269 Unspecified abnormalities of gait and mobility: Secondary | ICD-10-CM

## 2019-02-10 DIAGNOSIS — E1169 Type 2 diabetes mellitus with other specified complication: Secondary | ICD-10-CM

## 2019-02-10 DIAGNOSIS — R2232 Localized swelling, mass and lump, left upper limb: Secondary | ICD-10-CM | POA: Diagnosis not present

## 2019-02-10 DIAGNOSIS — E669 Obesity, unspecified: Secondary | ICD-10-CM

## 2019-02-10 DIAGNOSIS — R2681 Unsteadiness on feet: Secondary | ICD-10-CM

## 2019-02-10 DIAGNOSIS — E66811 Obesity, class 1: Secondary | ICD-10-CM

## 2019-02-10 DIAGNOSIS — R29898 Other symptoms and signs involving the musculoskeletal system: Secondary | ICD-10-CM

## 2019-02-10 DIAGNOSIS — E782 Mixed hyperlipidemia: Secondary | ICD-10-CM

## 2019-02-10 DIAGNOSIS — G20A1 Parkinson's disease without dyskinesia, without mention of fluctuations: Secondary | ICD-10-CM

## 2019-02-10 DIAGNOSIS — R222 Localized swelling, mass and lump, trunk: Secondary | ICD-10-CM

## 2019-02-10 DIAGNOSIS — E049 Nontoxic goiter, unspecified: Secondary | ICD-10-CM

## 2019-02-10 DIAGNOSIS — G20 Parkinson's disease: Secondary | ICD-10-CM

## 2019-02-10 DIAGNOSIS — Z720 Tobacco use: Secondary | ICD-10-CM

## 2019-02-10 MED ORDER — ASPIRIN EC 81 MG PO TBEC: 81.0000 mg | DELAYED_RELEASE_TABLET | Freq: Every day | ORAL | Status: DC

## 2019-02-10 NOTE — Progress Notes (Signed)
BP 118/70   Pulse 71   Temp 98.1 F (36.7 C)   Ht 5' (1.524 m)   Wt 165 lb 9.6 oz (75.1 kg)   SpO2 96%   BMI 32.34 kg/m    Subjective:    Patient ID: Whitney [name], female    DOB: [birth year], 78 y.o.   MRN: [id number]  HPI: Whitney [name] is a 78 y.o. female  Chief Complaint  Patient presents with  . Follow-up    HPI Here for f/u; she established care with me at the last visit in February  Type 2 diabetes; she is supposed to be seeing podiatrist but has not heard anything yet (referral entered Feb 4th); she has numbness in both feet, started on the left foot 6 months ago and now involving the right; not checking feet; has a hand mirror that she could use She checks FSBS once in a while, all in the 120s She is going to see eye doctor very soon; she will schedule appt she says; referral entered Feb 4th Lab Results  Component Value Date   HGBA1C 6.7 (H) 01/13/2019    Bladder issue; going to see urologist soon; urine has an odor; last urine showed no bacteria and no growth on culture  Obesity; limited mobility; seeing physical therapy, before moving here for arm, not leg; using walker  High cholesterol; on statin; limiting fried foods; cereal with whole milk; will not drink skim but willing to switch 2%; not much cheese; had a Whopper with cheese the other day; does eat bologna, used to eat daily  She has an US of the thyroid was done on Feb 25th and she is scheduled for FNA biopsy with her Ear Nose Throat doctor soon  She had an US of the mass on her upper back/shoulder on February 11th; an MRI was recommended by radiology; the patient says she has not heard anything about the MRI yet  Depression screen [facility] 2/9 02/10/2019 01/13/2019  Decreased Interest 2 3  Down, Depressed, Hopeless 1 2  PHQ - 2 Score 3 5  Altered sleeping 1 0  Tired, decreased energy 1 3  Change in appetite 0 0  Feeling bad or failure about yourself  1 0  Trouble concentrating 0 0  Moving slowly or  fidgety/restless 0 0  Suicidal thoughts 0 0  PHQ-9 Score 6 8  Difficult doing work/chores Somewhat difficult Not difficult at all   Fall Risk  02/10/2019 01/13/2019  Falls in the past year? 1 1  Number falls in past yr: 1 1  Injury with Fall? 1 1    Relevant past medical, surgical, family and social history reviewed Past Medical History:  Diagnosis Date  . Arthritis   . Depression   . Diabetes mellitus without complication (HCC)   . Goiter   . Overactive bladder   . Unsteady gait    Past Surgical History:  Procedure Laterality Date  . ABDOMINAL HYSTERECTOMY    . BLADDER REPAIR    . NECK SURGERY     Family History  Problem Relation Age of Onset  . Stroke Mother   . Hypertension Mother   . Diabetes Father   . Heart disease Father   . Lung cancer Father   . Colon cancer Sister   . Diabetes Sister   . Kidney disease Daughter   . Kidney cancer Daughter   . Pulmonary embolism Sister   . Diabetes Sister   . Kidney disease Sister   . Heart disease  Sister   . Atrial fibrillation Sister   . Diabetes Brother   . Heart disease Brother   . AAA (abdominal aortic aneurysm) Brother   . Cancer Brother        skin   Social History   Tobacco Use  . Smoking status: Current Some Day Smoker    Types: Cigarettes  . Smokeless tobacco: Never Used  . Tobacco comment: less than 1 a day  Substance Use Topics  . Alcohol use: Never    Frequency: Never  . Drug use: Never     Office Visit from 02/10/2019 in [facility]  AUDIT-C Score  0      Interim medical history since last visit reviewed. Allergies and medications reviewed  Review of Systems Per HPI unless specifically indicated above     Objective:    BP 118/70   Pulse 71   Temp 98.1 F (36.7 C)   Ht 5' (1.524 m)   Wt 165 lb 9.6 oz (75.1 kg)   SpO2 96%   BMI 32.34 kg/m   Wt Readings from Last 3 Encounters:  02/10/19 165 lb 9.6 oz (75.1 kg)  01/13/19 165 lb 3.2 oz (74.9 kg)  11/21/18 165 lb  (74.8 kg)    Physical Exam Constitutional:      General: She is not in acute distress.    Appearance: She is well-developed. She is not diaphoretic.  HENT:     Head: Normocephalic and atraumatic.  Eyes:     General: No scleral icterus. Neck:     Thyroid: No thyromegaly.  Cardiovascular:     Rate and Rhythm: Normal rate and regular rhythm.     Heart sounds: Normal heart sounds. No murmur.  Pulmonary:     Effort: Pulmonary effort is normal. No respiratory distress.     Breath sounds: Normal breath sounds. No wheezing.  Abdominal:     General: Bowel sounds are normal. There is no distension.     Palpations: Abdomen is soft.  Musculoskeletal:       Back:     Comments: Visible and palpable soft tissue mass over the upper LEFT side back; about 8-10 cm across in greatest dimension (estimated); soft; nontender; no overlying skin changes  Skin:    General: Skin is warm and dry.     Coloration: Skin is not pale.  Neurological:     Mental Status: She is alert.     Comments: Left weakness, 4+/5 on the LEFT lower extremity; 5/5 on the right; limited facial expression, not quite "masked" yet; paucity of spontaneous movement  Psychiatric:        Mood and Affect: Mood is not anxious or depressed.        Behavior: Behavior normal.        Thought Content: Thought content normal.        Judgment: Judgment normal.     Results for orders placed or performed in visit on 01/15/19  HM MAMMOGRAPHY  Result Value Ref Range   HM Mammogram 0-4 Bi-Rad 0-4 Bi-Rad, Self Reported Normal      Assessment & Plan:   Problem List Items Addressed This Visit      Endocrine   Goiter (Chronic)    Due for FNA biopsy with Ear Nose Throat soon (Dr. Willeen Cass)      Diabetes mellitus type 2 in obese (HCC) (Chronic)    Last A1c one month ago showed adequate control (6.7); encouraged healthy eating, weight loss; eye exam due and  patient will contact ophtho to schedule the appt, referral placed Feb 4th; foot exam  by MD again today; since not on insulin, appropriate to check FSBS only if desired or symptomatic based on [facility] Best Practices      Relevant Medications   aspirin EC 81 MG tablet     Nervous and Auditory   Parkinson disease (HCC)    Chronic, stable; however patient is not seeing a neurologist and I think this would be of benefit; referral placed      Relevant Orders   Ambulatory referral to Neurology     Other   Tobacco abuse    I advised patient to quit smoking; see AVS      Obesity (BMI 30.0-34.9) (Chronic)    Encouraged modest weight loss      Mass on back    MRI ordered to further evaluate the visible and palpable mass on her back; closest appropriate order was neck soft tissue, could not find trunk soft tissue; notation in order of exact location and order can be changed to another IMG code if radiology staff suggests another more appropriate; see pictorial documentation in body of note for location      Hyperlipidemia    Praise given for steps to improving her diet (limiting bologna, e.g.); continue statin; goal LDL less than 70      Relevant Medications   aspirin EC 81 MG tablet   Gait abnormality    Refer to physical therapy; I'm going to put in a referral to neurology as well      Relevant Orders   Ambulatory referral to Neurology    Other Visit Diagnoses    Leg weakness, bilateral    -  Primary   Relevant Orders   Ambulatory referral to Physical Therapy   Ambulatory referral to Neurology   Gait instability       Relevant Orders   Ambulatory referral to Physical Therapy   Ambulatory referral to Neurology   Localized swelling, mass, or lump of left upper extremity       Relevant Orders   MR NECK SOFT TISSUE W CONTRAST       Follow up plan: Return in about 1 month (around 03/13/2019).  An after-visit summary was printed and given to the patient at check-out.  Please see the patient instructions which may contain other information and recommendations  beyond what is mentioned above in the assessment and plan.  Meds ordered this encounter  Medications  . aspirin EC 81 MG tablet    Sig: Take 1 tablet (81 mg total) by mouth daily. (take at least one hour prior to any ibuprofen)    Orders Placed This Encounter  Procedures  . MR NECK SOFT TISSUE W CONTRAST  . Ambulatory referral to Physical Therapy  . Ambulatory referral to Neurology

## 2019-02-10 NOTE — Patient Instructions (Addendum)
Return on or just after March 18th for non-fasting labs for your cholesterol  We'll get an MRI of the thoracic region  I do encourage you to quit smoking Call [phone] to sign up for smoking cessation classes You can call 1-800-QUIT-NOW to talk with a smoking cessation coach  Please do see your eye doctor regularly, and have your eyes examined every year (or more often per his or her recommendation) Check your feet every night and let me know right away of any sores, infections, numbness, etc. Try to limit sweets, white bread, white rice, white potatoes It is okay with me for you to not check your fingerstick blood sugars unless you are interested and feel it would be helpful for you   Coping with Quitting Smoking  Quitting smoking is a physical and mental challenge. You will face cravings, withdrawal symptoms, and temptation. Before quitting, work with your health care provider to make a plan that can help you cope. Preparation can help you quit and keep you from giving in. How can I cope with cravings? Cravings usually last for 5-10 minutes. If you get through it, the craving will pass. Consider taking the following actions to help you cope with cravings:  Keep your mouth busy: ? Chew sugar-free gum. ? Suck on hard candies or a straw. ? Brush your teeth.  Keep your hands and body busy: ? Immediately change to a different activity when you feel a craving. ? Squeeze or play with a ball. ? Do an activity or a hobby, like making bead jewelry, practicing needlepoint, or working with wood. ? Mix up your normal routine. ? Take a short exercise break. Go for a quick walk or run up and down stairs. ? Spend time in public places where smoking is not allowed.  Focus on doing something kind or helpful for someone else.  Call a friend or family member to talk during a craving.  Join a support group.  Call a quit line, such as 1-800-QUIT-NOW.  Talk with your health care provider  about medicines that might help you cope with cravings and make quitting easier for you. How can I deal with withdrawal symptoms? Your body may experience negative effects as it tries to get used to not having nicotine in the system. These effects are called withdrawal symptoms. They may include:  Feeling hungrier than normal.  Trouble concentrating.  Irritability.  Trouble sleeping.  Feeling depressed.  Restlessness and agitation.  Craving a cigarette. To manage withdrawal symptoms:  Avoid places, people, and activities that trigger your cravings.  Remember why you want to quit.  Get plenty of sleep.  Avoid coffee and other caffeinated drinks. These may worsen some of your symptoms. How can I handle social situations? Social situations can be difficult when you are quitting smoking, especially in the first few weeks. To manage this, you can:  Avoid parties, bars, and other social situations where people might be smoking.  Avoid alcohol.  Leave right away if you have the urge to smoke.  Explain to your family and friends that you are quitting smoking. Ask for understanding and support.  Plan activities with friends or family where smoking is not an option. What are some ways I can cope with stress? Wanting to smoke may cause stress, and stress can make you want to smoke. Find ways to manage your stress. Relaxation techniques can help. For example:  Breathe slowly and deeply, in through your nose and out through your mouth.  Listen  to soothing, relaxing music.  Talk with a family member or friend about your stress.  Light a candle.  Soak in a bath or take a shower.  Think about a peaceful place. What are some ways I can prevent weight gain? Be aware that many people gain weight after they quit smoking. However, not everyone does. To keep from gaining weight, have a plan in place before you quit and stick to the plan after you quit. Your plan should  include:  Having healthy snacks. When you have a craving, it may help to: ? Eat plain popcorn, crunchy carrots, celery, or other cut vegetables. ? Chew sugar-free gum.  Changing how you eat: ? Eat small portion sizes at meals. ? Eat 4-6 small meals throughout the day instead of 1-2 large meals a day. ? Be mindful when you eat. Do not watch television or do other things that might distract you as you eat.  Exercising regularly: ? Make time to exercise each day. If you do not have time for a long workout, do short bouts of exercise for 5-10 minutes several times a day. ? Do some form of strengthening exercise, like weight lifting, and some form of aerobic exercise, like running or swimming.  Drinking plenty of water or other low-calorie or no-calorie drinks. Drink 6-8 glasses of water daily, or as much as instructed by your health care provider. Summary  Quitting smoking is a physical and mental challenge. You will face cravings, withdrawal symptoms, and temptation to smoke again. Preparation can help you as you go through these challenges.  You can cope with cravings by keeping your mouth busy (such as by chewing gum), keeping your body and hands busy, and making calls to family, friends, or a helpline for people who want to quit smoking.  You can cope with withdrawal symptoms by avoiding places where people smoke, avoiding drinks with caffeine, and getting plenty of rest.  Ask your health care provider about the different ways to prevent weight gain, avoid stress, and handle social situations. This information is not intended to replace advice given to you by your health care provider. Make sure you discuss any questions you have with your health care provider. Document Released: 11/23/2016 Document Revised: 11/23/2016 Document Reviewed: 11/23/2016 Elsevier Interactive Patient Education  2019 [employer].

## 2019-02-12 ENCOUNTER — Telehealth: Payer: Self-pay | Admitting: Family Medicine

## 2019-02-12 DIAGNOSIS — Z720 Tobacco use: Secondary | ICD-10-CM | POA: Insufficient documentation

## 2019-02-12 NOTE — Assessment & Plan Note (Addendum)
Chronic, stable; however patient is not seeing a neurologist and I think this would be of benefit; referral placed

## 2019-02-12 NOTE — Assessment & Plan Note (Signed)
Encouraged modest weight loss 

## 2019-02-12 NOTE — Assessment & Plan Note (Addendum)
MRI ordered to further evaluate the visible and palpable mass on her back; closest appropriate order was neck soft tissue, could not find trunk soft tissue; notation in order of exact location and order can be changed to another IMG code if radiology staff suggests another more appropriate; see pictorial documentation in body of note for location

## 2019-02-12 NOTE — Telephone Encounter (Signed)
Left detailed voicemail

## 2019-02-12 NOTE — Assessment & Plan Note (Signed)
Last A1c one month ago showed adequate control (6.7); encouraged healthy eating, weight loss; eye exam due and patient will contact ophtho to schedule the appt, referral placed Feb 4th; foot exam by MD again today; since not on insulin, appropriate to check FSBS only if desired or symptomatic based on [facility] Best Practices

## 2019-02-12 NOTE — Assessment & Plan Note (Signed)
Praise given for steps to improving her diet (limiting bologna, e.g.); continue statin; goal LDL less than 70

## 2019-02-12 NOTE — Assessment & Plan Note (Signed)
Due for FNA biopsy with Ear Nose Throat soon (Dr. Willeen Cass)

## 2019-02-12 NOTE — Telephone Encounter (Signed)
Please let the patient know that I've entered a referral for her to see a neurologist We're still getting to know each other and as I reviewed her chart, I don't see that she is established with a neurologist I've put that referral in and she should expect a call about an appointment soon I've also asked that they evaluate her gait issue and leg weakness

## 2019-02-12 NOTE — Assessment & Plan Note (Signed)
Refer to physical therapy; I'm going to put in a referral to neurology as well

## 2019-02-12 NOTE — Assessment & Plan Note (Signed)
I advised patient to quit smoking; see AVS

## 2019-02-13 ENCOUNTER — Ambulatory Visit: Payer: Medicare Other | Admitting: Urology

## 2019-02-16 ENCOUNTER — Ambulatory Visit
Admission: RE | Admit: 2019-02-16 | Discharge: 2019-02-16 | Disposition: A | Discharge status: Home / Self Care | Payer: Medicare Other | Source: Ambulatory Visit | Attending: Otolaryngology | Admitting: Otolaryngology

## 2019-02-16 ENCOUNTER — Other Ambulatory Visit: Payer: Self-pay

## 2019-02-16 DIAGNOSIS — E041 Nontoxic single thyroid nodule: Secondary | ICD-10-CM | POA: Diagnosis not present

## 2019-02-16 DIAGNOSIS — R221 Localized swelling, mass and lump, neck: Secondary | ICD-10-CM | POA: Insufficient documentation

## 2019-02-16 MED ORDER — HYDROCODONE-ACETAMINOPHEN 5-325 MG PO TABS: 1.0000 | ORAL_TABLET | ORAL | Status: DC | PRN

## 2019-02-16 NOTE — Procedures (Signed)
  Procedure: [place] FNA L thyroid nodule x7 25g EBL:   minimal Complications:  none immediate  See full dictation in [employer].  Thora Lance MD Main # [phone] Pager  [phone]

## 2019-02-16 NOTE — Discharge Instructions (Signed)
Post Operative Instructions for Thyroid Biopsy  1. Keep pressure bandage over site of biopsy for 3-4 hours after leaving office.  2. Normal activity is permitted as long as it does not involve anything strenuous (i.e., jogging, heavy lifting, etc.).  3. Some minor pain may be experienced when the local anesthesia wears off, and should be relieved with Tylenol, Advil, or ibuprofen.  4. You may experience some bruising and/or swelling at the site of the biopsy.  5. If you should develop severe pain, difficulty swallowing or difficulty breathing, please call the office or [facility] at [phone].  6. Apply ice pack for 20 minutes this evening.      

## 2019-02-17 ENCOUNTER — Telehealth: Payer: Self-pay | Admitting: Family Medicine

## 2019-02-17 DIAGNOSIS — R2232 Localized swelling, mass and lump, left upper limb: Secondary | ICD-10-CM

## 2019-02-17 LAB — CYTOLOGY - NON PAP

## 2019-02-17 NOTE — Telephone Encounter (Signed)
It will not let me discontinue because it says already scheduled

## 2019-02-17 NOTE — Telephone Encounter (Signed)
New order signed Please cancel last one

## 2019-02-17 NOTE — Telephone Encounter (Signed)
Copied from CRM [id number]. Topic: General - Other >> Feb 17, 2019 10:58 AM Maia Petties wrote: Reason for CRM: Clydie Braun called stating order is incorrect. MR NECK SOFT TISSUE W CONTRAST needs to be changed.  This should be with and without contrast.

## 2019-02-23 ENCOUNTER — Ambulatory Visit: Admission: RE | Admit: 2019-02-23 | Payer: Medicare Other | Source: Ambulatory Visit

## 2019-02-25 ENCOUNTER — Ambulatory Visit (INDEPENDENT_AMBULATORY_CARE_PROVIDER_SITE_OTHER): Payer: Medicare Other | Admitting: Urology

## 2019-02-25 ENCOUNTER — Other Ambulatory Visit: Payer: Self-pay

## 2019-02-25 ENCOUNTER — Encounter: Payer: Self-pay | Admitting: Urology

## 2019-02-25 VITALS — BP 131/77 | HR 92 | Ht 60.0 in | Wt 163.0 lb

## 2019-02-25 DIAGNOSIS — N3281 Overactive bladder: Secondary | ICD-10-CM

## 2019-02-25 DIAGNOSIS — E782 Mixed hyperlipidemia: Secondary | ICD-10-CM

## 2019-02-25 DIAGNOSIS — N3941 Urge incontinence: Secondary | ICD-10-CM | POA: Diagnosis not present

## 2019-02-25 LAB — BLADDER SCAN AMB NON-IMAGING

## 2019-02-25 MED ORDER — SOLIFENACIN SUCCINATE 5 MG PO TABS: 5.0000 mg | ORAL_TABLET | Freq: Every day | ORAL | 11 refills | Status: DC

## 2019-02-25 NOTE — Progress Notes (Signed)
02/25/2019 9:59 AM   Whitney [name] 12-17-1940 [id number]  Referring provider: Kerman Passey, MD [address] [place], [place] [address]  Chief Complaint  Patient presents with  . Overactive bladder    HPI:  Whitney [name] was referred for urinary frequency and urgency.  She has urgency with running water and standing. She has some urge incontinence. She doesn't need pads every day. She has been on Myrbetriq 50 mg. She voids with a good stream. No gross hematuria. She had a cystotomy in 1980 during Hx that was repaired. Bowels are regular.   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  She has had some recent gait abnormalities and Dr. Sherie Don considered MRI lumbar spine but pt derferred as she's dealing with a left shoulder/arm issue.    Her urinalysis was normal last month.  Her postvoid today is 0.   PMH: Past Medical History:  Diagnosis Date  . Arthritis   . Depression   . Diabetes mellitus without complication (HCC)   . Goiter   . Overactive bladder   . Unsteady gait     Surgical History: Past Surgical History:  Procedure Laterality Date  . ABDOMINAL HYSTERECTOMY    . BLADDER REPAIR    . NECK SURGERY      Home Medications:  Allergies as of 02/25/2019   No Known Allergies     Medication List       Accurate as of February 25, 2019  9:59 AM. Always use your most recent med list.        aspirin EC 81 MG tablet Take 1 tablet (81 mg total) by mouth daily. (take at least one hour prior to any ibuprofen)   atorvastatin 40 MG tablet Commonly known as:  LIPITOR Take 1 tablet (40 mg total) by mouth at bedtime.   buPROPion 150 MG 24 hr tablet Commonly known as:  WELLBUTRIN XL Take 1 tablet (150 mg total) by mouth daily.   diphenhydrAMINE 25 MG tablet Commonly known as:  BENADRYL Take 25 mg by mouth every 12 (twelve) hours.   ibuprofen 200 MG tablet Commonly known as:  ADVIL,MOTRIN Take 200 mg by mouth every 6 (six) hours as  needed.   metFORMIN 500 MG 24 hr tablet Commonly known as:  GLUCOPHAGE-XR Take 1,000 mg by mouth 2 (two) times daily.   Myrbetriq 50 MG Tb24 tablet Generic drug:  mirabegron ER Take 50 mg by mouth daily.   vitamin B-12 100 MCG tablet Commonly known as:  CYANOCOBALAMIN Take 100 mcg by mouth daily.       Allergies: No Known Allergies  Family History: Family History  Problem Relation Age of Onset  . Stroke Mother   . Hypertension Mother   . Diabetes Father   . Heart disease Father   . Lung cancer Father   . Colon cancer Sister   . Diabetes Sister   . Kidney disease Daughter   . Kidney cancer Daughter   . Pulmonary embolism Sister   . Diabetes Sister   . Kidney disease Sister   . Heart disease Sister   . Atrial fibrillation Sister   . Diabetes Brother   . Heart disease Brother   . AAA (abdominal aortic aneurysm) Brother   . Cancer Brother        skin    Social History:  reports that she has been smoking cigarettes. She has never used smokeless tobacco. She reports that she does not drink alcohol or use drugs.  ROS: UROLOGY Frequent Urination?: Yes Hard to postpone urination?: Yes Burning/pain with urination?: No Get up at night to urinate?: Yes Leakage of urine?: No Urine stream starts and stops?: No Trouble starting stream?: No Do you have to strain to urinate?: No Blood in urine?: No Urinary tract infection?: No Sexually transmitted disease?: No Injury to kidneys or bladder?: No Painful intercourse?: No Weak stream?: No Currently pregnant?: No Vaginal bleeding?: No Last menstrual period?: n  Gastrointestinal Nausea?: No Vomiting?: No Indigestion/heartburn?: No Diarrhea?: No Constipation?: No  Constitutional Fever: No Night sweats?: No Weight loss?: No Fatigue?: Yes  Skin Skin rash/lesions?: No Itching?: No  Eyes Blurred vision?: No Double vision?: No  Ears/Nose/Throat Sore throat?: No Sinus problems?: No  Hematologic/Lymphatic  Swollen glands?: No Easy bruising?: No  Cardiovascular Leg swelling?: No Chest pain?: No  Respiratory Cough?: No Shortness of breath?: No  Endocrine Excessive thirst?: No  Musculoskeletal Back pain?: No Joint pain?: No  Neurological Headaches?: No Dizziness?: No  Psychologic Depression?: No Anxiety?: No  Physical Exam: BP 131/77   Pulse 92   Ht 5' (1.524 m)   Wt 73.9 kg   BMI 31.83 kg/m   Constitutional:  Alert and oriented, No acute distress. HEENT: [place] AT, moist mucus membranes.  Trachea midline, no masses. Cardiovascular: No clubbing, cyanosis, or edema. Respiratory: Normal respiratory effort, no increased work of breathing. GI: Abdomen is soft, nontender, nondistended, no abdominal masses GU: No CVA tenderness Skin: No rashes, bruises or suspicious lesions. Neurologic: Grossly intact, no focal deficits, moving all 4 extremities. Psychiatric: Normal mood and affect.  Laboratory Data: Lab Results  Component Value Date   WBC 6.4 11/21/2018   HGB 12.9 11/21/2018   HCT 39.8 11/21/2018   MCV 85.0 11/21/2018   PLT 130 (L) 11/21/2018    Lab Results  Component Value Date   CREATININE 0.64 01/13/2019    No results found for: PSA  No results found for: TESTOSTERONE  Lab Results  Component Value Date   HGBA1C 6.7 (H) 01/13/2019    Urinalysis    Component Value Date/Time   COLORURINE YELLOW 01/13/2019 1213   APPEARANCEUR CLEAR 01/13/2019 1213   LABSPEC 1.018 01/13/2019 1213   PHURINE < OR = 5.0 01/13/2019 1213   GLUCOSEU NEGATIVE 01/13/2019 1213   HGBUR NEGATIVE 01/13/2019 1213   KETONESUR NEGATIVE 01/13/2019 1213   PROTEINUR NEGATIVE 01/13/2019 1213    Lab Results  Component Value Date   BACTERIA NONE SEEN 01/13/2019    Pertinent Imaging: n/a No results found for this or any previous visit. No results found for this or any previous visit. No results found for this or any previous visit. No results found for this or any previous visit.  No results found for this or any previous visit. No results found for this or any previous visit. No results found for this or any previous visit. No results found for this or any previous visit.  Assessment & Plan:    1. Overactive bladder Her UA is clear. No worrisome symptoms and PVR nl. Discussed some options like adding anticholinergic to Myrbetriq, PT and PTNS. She will start solifenacin or equivalent.  - Urinalysis, Complete - Bladder Scan (Post Void Residual) in office   No follow-ups on file.  Jerilee Field, MD  [facility] [address], [address] [place], [place] [address] [phone]

## 2019-02-25 NOTE — Patient Instructions (Signed)

## 2019-02-26 ENCOUNTER — Other Ambulatory Visit: Payer: Self-pay | Admitting: Family Medicine

## 2019-02-26 LAB — LIPID PANEL
Cholesterol: 132 mg/dL (ref <200)
HDL: 59 mg/dL (ref >=50)
LDL Cholesterol (Calc): 54 mg/dL (calc)
Non-HDL Cholesterol (Calc): 73 mg/dL (calc) (ref <130)
Total CHOL/HDL Ratio: 2.2 (calc) (ref <5.0)
Triglycerides: 102 mg/dL (ref <150)

## 2019-02-26 MED ORDER — ATORVASTATIN CALCIUM 40 MG PO TABS: 40.0000 mg | ORAL_TABLET | Freq: Every day | ORAL | 1 refills | Status: DC

## 2019-02-26 NOTE — Progress Notes (Signed)
Whitney [name], please let the patient know that she has really brought her cholesterol down! We are so happy with her results. Her total cholesterol came down 77 points, and her LDL cholesterol came down 69 points. That is great! Please encourage her to keep up whatever healthy eating habits she is doing, try to lose weight, and we'll continue the medicine. I just sent refills to [facility]. Thank you

## 2019-03-05 ENCOUNTER — Telehealth: Payer: Self-pay | Admitting: Family Medicine

## 2019-03-05 NOTE — Telephone Encounter (Signed)
Copied from CRM [id number]. Topic: General - Other >> Mar 05, 2019 11:02 AM Fanny Bien wrote: Reason for CRM: Kathlene November calling with [facility] called and stated that he has sent over 2 faxes regarding the patients continual need for nursing and PT. He is asking that we look at the fax. Please advise

## 2019-03-05 NOTE — Telephone Encounter (Signed)
Please approve ongoing PT and nursing care until patient is able to see Dr. Sherie Don for follow up.

## 2019-03-06 ENCOUNTER — Telehealth: Payer: Self-pay

## 2019-03-06 NOTE — Telephone Encounter (Signed)
Faxes were received. Glennon Hamilton with [facility] back and per Maurice Small, NP, verbal orders were given. Kathlene November stated that he was unable to accept verbal orders but would have someone from [facility] call back to accept.  Copied from CRM [id number]. Topic: General - Other >> Mar 05, 2019 11:02 AM Fanny Bien wrote: Reason for CRM: Kathlene November calling with [facility] called and stated that he has sent over 2 faxes regarding the patients continual need for nursing and PT. He is asking that we look at the fax. Please advise

## 2019-03-11 ENCOUNTER — Ambulatory Visit: Admit: 2019-03-11 | Admitting: Otolaryngology

## 2019-03-11 SURGERY — THYROIDECTOMY
Anesthesia: General | Site: Neck | Laterality: Left

## 2019-03-12 ENCOUNTER — Encounter: Payer: Self-pay | Admitting: Family Medicine

## 2019-03-12 ENCOUNTER — Ambulatory Visit (INDEPENDENT_AMBULATORY_CARE_PROVIDER_SITE_OTHER): Payer: Medicare Other | Admitting: Family Medicine

## 2019-03-12 ENCOUNTER — Ambulatory Visit: Payer: Medicare Other

## 2019-03-12 DIAGNOSIS — N3281 Overactive bladder: Secondary | ICD-10-CM

## 2019-03-12 DIAGNOSIS — E1169 Type 2 diabetes mellitus with other specified complication: Secondary | ICD-10-CM

## 2019-03-12 DIAGNOSIS — E669 Obesity, unspecified: Secondary | ICD-10-CM

## 2019-03-12 DIAGNOSIS — E782 Mixed hyperlipidemia: Secondary | ICD-10-CM

## 2019-03-12 DIAGNOSIS — Z720 Tobacco use: Secondary | ICD-10-CM

## 2019-03-12 DIAGNOSIS — G20 Parkinson's disease: Secondary | ICD-10-CM

## 2019-03-12 DIAGNOSIS — F17210 Nicotine dependence, cigarettes, uncomplicated: Secondary | ICD-10-CM

## 2019-03-12 MED ORDER — BUPROPION HCL ER (XL) 150 MG PO TB24: 150.0000 mg | ORAL_TABLET | Freq: Every day | ORAL | 1 refills | Status: DC

## 2019-03-12 NOTE — Assessment & Plan Note (Signed)
Managed now by neurologist; continue carbidopa/levadopa

## 2019-03-12 NOTE — Assessment & Plan Note (Signed)
She will call her eye doctor to schedule an appt; continu emetofrmin

## 2019-03-12 NOTE — Assessment & Plan Note (Signed)
So glad she is doing so much better with medicine from the urologist

## 2019-03-12 NOTE — Progress Notes (Signed)
There were no vitals taken for this visit.   Subjective:    Patient ID: Whitney [name], female    DOB: Sep 09, 1941, 78 y.o.   MRN: [id number]  HPI: Whitney [name] is a 78 y.o. female  Chief Complaint  Patient presents with  . Follow-up  . Medication Refill    HPI Virtual Visit via Telephone Note   I connected with Whitney [name] on March 12, 2019 at 10:53 am EDT by telephone and verified that I am speaking with the correct person using two identifiers.   Staff discussed the limitations, risks, security, and privacy concerns of performing an evaluation and management service by telephone and the availability of in-person appointments. Staff discussed with the patient that he/she may be responsible for charges related to this service. The patient expressed understanding and agreed to proceed.   Additional participants: none  Patient location: home Provider location: home  Call started: 10:53 am Call terminated: 11:08 am Total length of call: 15 minutes  She saw the urologist and he started her on a new pill and she is sleeping through the night; that medicine is working great; "it's unreal"; she is sleeping much better  She saw the orthopaedist; they put a shot in the left shoulder and she could tell a difference, feels like a new woman  High cholesterol; on statin; no side effects; not eating a lot; cooking for herself now; she used to go out a lot and get fast food, but now cooking at home; meat and two vegetables, cereal in the morning or two small pancakes  Lab Results  Component Value Date   CHOL 132 02/25/2019   HDL 59 02/25/2019   LDLCALC 54 02/25/2019   TRIG 102 02/25/2019   CHOLHDL 2.2 02/25/2019    Diabetes; taking metformin; she drinks mostly water; no added sugar; not checking sugars; eye exam was postponed because of family member visiting; some blurred vision on the left; she will call to schedule eye visit  Lab Results  Component Value Date   HGBA1C 6.7  (H) 01/13/2019   Tobacco abuse; only one cigarette all week  Obesity; she weighs 163 pounds at home  Parkinson's disease; just saw neurologist and doing better on new medicine, Carbidopa/levodopa  Depression screen [facility] 2/9 03/12/2019 02/10/2019 01/13/2019  Decreased Interest 0 2 3  Down, Depressed, Hopeless 0 1 2  PHQ - 2 Score 0 3 5  Altered sleeping 0 1 0  Tired, decreased energy 0 1 3  Change in appetite 0 0 0  Feeling bad or failure about yourself  0 1 0  Trouble concentrating 0 0 0  Moving slowly or fidgety/restless 0 0 0  Suicidal thoughts 0 0 0  PHQ-9 Score 0 6 8  Difficult doing work/chores Not difficult at all Somewhat difficult Not difficult at all   Fall Risk  03/12/2019 02/10/2019 01/13/2019  Falls in the past year? 1 1 1   Number falls in past yr: 0 1 1  Injury with Fall? 0 1 1    Relevant past medical, surgical, family and social history reviewed Past Medical History:  Diagnosis Date  . Arthritis   . Depression   . Diabetes mellitus without complication (HCC)   . Goiter   . Overactive bladder   . Unsteady gait    Past Surgical History:  Procedure Laterality Date  . ABDOMINAL HYSTERECTOMY    . BLADDER REPAIR    . NECK SURGERY     Family History  Problem Relation Age  of Onset  . Stroke Mother   . Hypertension Mother   . Diabetes Father   . Heart disease Father   . Lung cancer Father   . Colon cancer Sister   . Diabetes Sister   . Kidney disease Daughter   . Kidney cancer Daughter   . Pulmonary embolism Sister   . Diabetes Sister   . Kidney disease Sister   . Heart disease Sister   . Atrial fibrillation Sister   . Diabetes Brother   . Heart disease Brother   . AAA (abdominal aortic aneurysm) Brother   . Cancer Brother        skin   Social History   Tobacco Use  . Smoking status: Current Some Day Smoker    Types: Cigarettes  . Smokeless tobacco: Never Used  . Tobacco comment: less than 1 a day  Substance Use Topics  . Alcohol use: Never     Frequency: Never  . Drug use: Never     Office Visit from 03/12/2019 in [facility]  AUDIT-C Score  0      Interim medical history since last visit reviewed. Allergies and medications reviewed  Review of Systems Per HPI unless specifically indicated above     Objective:    There were no vitals taken for this visit.  Wt Readings from Last 3 Encounters:  02/25/19 163 lb (73.9 kg)  02/10/19 165 lb 9.6 oz (75.1 kg)  01/13/19 165 lb 3.2 oz (74.9 kg)    Physical Exam Pulmonary:     Effort: No respiratory distress.  Neurological:     Mental Status: She is alert.     Cranial Nerves: No dysarthria.  Psychiatric:        Speech: Speech is not rapid and pressured, delayed or slurred.     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)    She will call her eye doctor to schedule an appt; continu emetofrmin        Nervous and Auditory   Parkinson disease (HCC)    Managed now by neurologist; continue carbidopa/levadopa      Relevant Medications   carbidopa-levodopa (SINEMET) 25-100 MG tablet     Genitourinary   Overactive bladder    So glad she is doing so much better with medicine from the urologist        Other   Tobacco abuse    So proud of her for cutting back; encouragement given      Hyperlipidemia    Glad she is cooking at home; no side effects from statin          Follow up plan: Return in about 4 months (around 07/15/2019) for follow-up visit with Dr. Sherie Don on or after.  An after-visit summary was printed and given to the patient at  check-out.  Please see the patient instructions which may contain other information and recommendations beyond what is mentioned above in the assessment and plan.  Meds ordered this  encounter  Medications  . buPROPion (WELLBUTRIN XL) 150 MG 24 hr tablet    Sig: Take 1 tablet (150 mg total) by mouth daily.    Dispense:  90 tablet    Refill:  1    No orders of the defined types were placed in this encounter.

## 2019-03-12 NOTE — Assessment & Plan Note (Signed)
Glad she is cooking at home; no side effects from statin

## 2019-03-12 NOTE — Assessment & Plan Note (Signed)
So proud of her for cutting back; encouragement given

## 2019-03-31 ENCOUNTER — Telehealth: Payer: Self-pay | Admitting: Family Medicine

## 2019-03-31 ENCOUNTER — Other Ambulatory Visit: Payer: Self-pay | Admitting: Family Medicine

## 2019-03-31 NOTE — Telephone Encounter (Signed)
I do not prescribe her Myrbetriq She needs to contact her prescriber for this please

## 2019-03-31 NOTE — Telephone Encounter (Signed)
Pt.notified

## 2019-03-31 NOTE — Telephone Encounter (Signed)
Patient called and left a voicemail for her medication Myrbetriq to be refilled. Per chart it states the patient was informed of denial at 1:44pm today. Please call patient to explain the confusion to her thank you

## 2019-04-01 ENCOUNTER — Other Ambulatory Visit: Payer: Self-pay

## 2019-04-01 NOTE — Telephone Encounter (Signed)
Pt stated her previous primary doctor was taking care of her myrbetriq and wanted to know if you can take care of it she she has moved here and you are her primary doctor. Please advise and notify Asher Muir as I will be out of the office until next week.

## 2019-04-01 NOTE — Telephone Encounter (Signed)
Pt notified of refusal reason per Dr. Sherie Don, message sent to Dr. Sherie Don with patients questions.

## 2019-04-01 NOTE — Telephone Encounter (Signed)
She just saw urologist, Dr. Mena Goes for overactive bladder I'm going to ask her to please get this medicine from her specialist It's always safest to have one provider prescribing in case he changes a dose or stops and starts something else I'll forward this to urologist

## 2019-04-02 MED ORDER — MIRABEGRON ER 50 MG PO TB24: 50.0000 mg | ORAL_TABLET | Freq: Every day | ORAL | 3 refills | Status: DC

## 2019-04-20 ENCOUNTER — Other Ambulatory Visit: Payer: Self-pay | Admitting: Nurse Practitioner

## 2019-04-20 ENCOUNTER — Ambulatory Visit: Admitting: Diagnostic Neuroimaging

## 2019-04-22 ENCOUNTER — Telehealth: Payer: Self-pay | Admitting: Urology

## 2019-04-22 DIAGNOSIS — N3941 Urge incontinence: Secondary | ICD-10-CM

## 2019-04-22 DIAGNOSIS — N3281 Overactive bladder: Secondary | ICD-10-CM

## 2019-04-22 MED ORDER — MIRABEGRON ER 50 MG PO TB24: 50.0000 mg | ORAL_TABLET | Freq: Every day | ORAL | 3 refills | Status: DC

## 2019-04-22 NOTE — Telephone Encounter (Signed)
Called pt no answer. LM informing pt that she should be taking myrbetriq in addition to vesicare. RX sent to [facility] for 90 day supply. Advised pt to call back for questions or concerns.

## 2019-04-22 NOTE — Telephone Encounter (Signed)
Patient called the office today to inquire if she needs to take the Vesicare and the Myrbetriq prescribed by Dr. Mena Goes.   If she is to continue to take the [facility], she needs [place] to cancel the prescription with [facility] and send the prescription to the [facility] in [place] for a 90 day supply.

## 2019-05-08 ENCOUNTER — Inpatient Hospital Stay: Admission: RE | Admit: 2019-05-08 | Source: Ambulatory Visit

## 2019-05-13 ENCOUNTER — Ambulatory Visit: Admit: 2019-05-13 | Admitting: Otolaryngology

## 2019-05-13 SURGERY — THYROIDECTOMY
Anesthesia: General | Laterality: Left

## 2019-05-22 ENCOUNTER — Other Ambulatory Visit: Payer: Self-pay | Admitting: Neurology

## 2019-05-22 DIAGNOSIS — R269 Unspecified abnormalities of gait and mobility: Secondary | ICD-10-CM

## 2019-05-26 ENCOUNTER — Ambulatory Visit (INDEPENDENT_AMBULATORY_CARE_PROVIDER_SITE_OTHER): Payer: Medicare Other

## 2019-05-26 VITALS — Ht 60.0 in | Wt 165.0 lb

## 2019-05-26 DIAGNOSIS — Z0000 Encounter for general adult medical examination without abnormal findings: Secondary | ICD-10-CM | POA: Diagnosis not present

## 2019-05-26 NOTE — Patient Instructions (Signed)
[name] , Thank you for taking time to come for your Medicare Wellness Visit. I appreciate your ongoing commitment to your health goals. Please review the following plan we discussed and let me know if I can assist you in the future.   Screening recommendations/referrals: Colonoscopy: no longer required Mammogram: postponed Bone Density: postponed Recommended yearly ophthalmology/optometry visit for glaucoma screening and checkup Recommended yearly dental visit for hygiene and checkup  Vaccinations: Influenza vaccine: postponed Pneumococcal vaccine: postponed Tdap vaccine: due - please contact us if you get a cut or scrape Shingles vaccine: Shingrix discussed. Please contact your pharmacy for coverage information.   Advanced directives: Advance directive discussed with you today. I have mailed a copy for you to complete at home and have notarized. Once this is complete please bring a copy in to our office so we can scan it into your chart.  Conditions/risks identified: recommend completing diabetic eye exam  Next appointment: Please follow up in one year for your Medicare Annual Wellness visit.    Preventive Care 78 Years and Older, Female Preventive care refers to lifestyle choices and visits with your health care provider that can promote health and wellness. What does preventive care include?  A yearly physical exam. This is also called an annual well check.  Dental exams once or twice a year.  Routine eye exams. Ask your health care provider how often you should have your eyes checked.  Personal lifestyle choices, including:  Daily care of your teeth and gums.  Regular physical activity.  Eating a healthy diet.  Avoiding tobacco and drug use.  Limiting alcohol use.  Practicing safe sex.  Taking low-dose aspirin every day.  Taking vitamin and mineral supplements as recommended by your health care provider. What happens during an annual well check? The services  and screenings done by your health care provider during your annual well check will depend on your age, overall health, lifestyle risk factors, and family history of disease. Counseling  Your health care provider may ask you questions about your:  Alcohol use.  Tobacco use.  Drug use.  Emotional well-being.  Home and relationship well-being.  Sexual activity.  Eating habits.  History of falls.  Memory and ability to understand (cognition).  Work and work Statistician.  Reproductive health. Screening  You may have the following tests or measurements:  Height, weight, and BMI.  Blood pressure.  Lipid and cholesterol levels. These may be checked every 5 years, or more frequently if you are over 76 years old.  Skin check.  Lung cancer screening. You may have this screening every year starting at age 20 if you have a 30-pack-year history of smoking and currently smoke or have quit within the past 15 years.  Fecal occult blood test (FOBT) of the stool. You may have this test every year starting at age 68.  Flexible sigmoidoscopy or colonoscopy. You may have a sigmoidoscopy every 5 years or a colonoscopy every 10 years starting at age 19.  Hepatitis C blood test.  Hepatitis B blood test.  Sexually transmitted disease (STD) testing.  Diabetes screening. This is done by checking your blood sugar (glucose) after you have not eaten for a while (fasting). You may have this done every 1-3 years.  Bone density scan. This is done to screen for osteoporosis. You may have this done starting at age 16.  Mammogram. This may be done every 1-2 years. Talk to your health care provider about how often you should have regular mammograms.  Talk with your health care provider about your test results, treatment options, and if necessary, the need for more tests. Vaccines  Your health care provider may recommend certain vaccines, such as:  Influenza vaccine. This is recommended every year.   Tetanus, diphtheria, and acellular pertussis (Tdap, Td) vaccine. You may need a Td booster every 10 years.  Zoster vaccine. You may need this after age 47.  Pneumococcal 13-valent conjugate (PCV13) vaccine. One dose is recommended after age 45.  Pneumococcal polysaccharide (PPSV23) vaccine. One dose is recommended after age 56. Talk to your health care provider about which screenings and vaccines you need and how often you need them. This information is not intended to replace advice given to you by your health care provider. Make sure you discuss any questions you have with your health care provider. Document Released: 12/23/2015 Document Revised: 08/15/2016 Document Reviewed: 09/27/2015 Elsevier Interactive Patient Education  2017 [place] Prevention in the Home Falls can cause injuries. They can happen to people of all ages. There are many things you can do to make your home safe and to help prevent falls. What can I do on the outside of my home?  Regularly fix the edges of walkways and driveways and fix any cracks.  Remove anything that might make you trip as you walk through a door, such as a raised step or threshold.  Trim any bushes or trees on the path to your home.  Use bright outdoor lighting.  Clear any walking paths of anything that might make someone trip, such as rocks or tools.  Regularly check to see if handrails are loose or broken. Make sure that both sides of any steps have handrails.  Any raised decks and porches should have guardrails on the edges.  Have any leaves, snow, or ice cleared regularly.  Use sand or salt on walking paths during winter.  Clean up any spills in your garage right away. This includes oil or grease spills. What can I do in the bathroom?  Use night lights.  Install grab bars by the toilet and in the tub and shower. Do not use towel bars as grab bars.  Use non-skid mats or decals in the tub or shower.  If you need to  sit down in the shower, use a plastic, non-slip stool.  Keep the floor dry. Clean up any water that spills on the floor as soon as it happens.  Remove soap buildup in the tub or shower regularly.  Attach bath mats securely with double-sided non-slip rug tape.  Do not have throw rugs and other things on the floor that can make you trip. What can I do in the bedroom?  Use night lights.  Make sure that you have a light by your bed that is easy to reach.  Do not use any sheets or blankets that are too big for your bed. They should not hang down onto the floor.  Have a firm chair that has side arms. You can use this for support while you get dressed.  Do not have throw rugs and other things on the floor that can make you trip. What can I do in the kitchen?  Clean up any spills right away.  Avoid walking on wet floors.  Keep items that you use a lot in easy-to-reach places.  If you need to reach something above you, use a strong step stool that has a grab bar.  Keep electrical cords out of the way.  Do not use floor polish or wax that makes floors slippery. If you must use wax, use non-skid floor wax.  Do not have throw rugs and other things on the floor that can make you trip. What can I do with my stairs?  Do not leave any items on the stairs.  Make sure that there are handrails on both sides of the stairs and use them. Fix handrails that are broken or loose. Make sure that handrails are as long as the stairways.  Check any carpeting to make sure that it is firmly attached to the stairs. Fix any carpet that is loose or worn.  Avoid having throw rugs at the top or bottom of the stairs. If you do have throw rugs, attach them to the floor with carpet tape.  Make sure that you have a light switch at the top of the stairs and the bottom of the stairs. If you do not have them, ask someone to add them for you. What else can I do to help prevent falls?  Wear shoes that:  Do not  have high heels.  Have rubber bottoms.  Are comfortable and fit you well.  Are closed at the toe. Do not wear sandals.  If you use a stepladder:  Make sure that it is fully opened. Do not climb a closed stepladder.  Make sure that both sides of the stepladder are locked into place.  Ask someone to hold it for you, if possible.  Clearly mark and make sure that you can see:  Any grab bars or handrails.  First and last steps.  Where the edge of each step is.  Use tools that help you move around (mobility aids) if they are needed. These include:  Canes.  Walkers.  Scooters.  Crutches.  Turn on the lights when you go into a dark area. Replace any light bulbs as soon as they burn out.  Set up your furniture so you have a clear path. Avoid moving your furniture around.  If any of your floors are uneven, fix them.  If there are any pets around you, be aware of where they are.  Review your medicines with your doctor. Some medicines can make you feel dizzy. This can increase your chance of falling. Ask your doctor what other things that you can do to help prevent falls. This information is not intended to replace advice given to you by your health care provider. Make sure you discuss any questions you have with your health care provider. Document Released: 09/22/2009 Document Revised: 05/03/2016 Document Reviewed: 12/31/2014 Elsevier Interactive Patient Education  2017 [employer].

## 2019-05-26 NOTE — Progress Notes (Signed)
Subjective:   Whitney DavidsonMarjorie [name] is a 78 y.o. female who presents for an Initial Medicare Annual Wellness Visit.  Virtual Visit via Telephone Note  I connected with Whitney DavidsonMarjorie [name] on 05/26/19 at 10:20 AM EDT by telephone and verified that I am speaking with the correct person using two identifiers.  Medicare Annual Wellness visit completed telephonically due to Covid-19 pandemic.   Location: Patient: home Provider: office   I discussed the limitations, risks, security and privacy concerns of performing an evaluation and management service by telephone and the availability of in person appointments. The patient expressed understanding and agreed to proceed.  Some vital signs may be absent or patient reported. Pt states she does not have an at home blood pressure monitor.    Reather LittlerKasey Shivangi Lutz, LPN  Review of Systems     Cardiac Risk Factors include: diabetes mellitus;hypertension;dyslipidemia;obesity (BMI >30kg/m2);advanced age (>6755men, 53>65 women);smoking/ tobacco exposure     Objective:    Today's Vitals   05/26/19 1021  Weight: 165 lb (74.8 kg)  Height: 5' (1.524 m)   Body mass index is 32.22 kg/m.  Advanced Directives 05/26/2019 11/21/2018  Does Patient Have a Medical Advance Directive? No Yes  Type of Advance Directive - Living will;Healthcare Power of Attorney  Copy of Healthcare Power of Attorney in Chart? - No - copy requested  Would patient like information on creating a medical advance directive? Yes (MAU/Ambulatory/Procedural Areas - Information given) -    Current Medications (verified) Outpatient Encounter Medications as of 05/26/2019  Medication Sig  . aspirin EC 81 MG tablet Take 1 tablet (81 mg total) by mouth daily. (take at least one hour prior to any ibuprofen)  . atorvastatin (LIPITOR) 40 MG tablet Take 1 tablet (40 mg total) by mouth at bedtime.  Marland Kitchen. buPROPion (WELLBUTRIN XL) 150 MG 24 hr tablet Take 1 tablet (150 mg total) by mouth daily.  . carbidopa-levodopa  (SINEMET IR) 25-100 MG tablet Take 1.5 pills three times a day for one week then increase to 2 pills three times a day  . metFORMIN (GLUCOPHAGE-XR) 500 MG 24 hr tablet Take 1,000 mg by mouth 2 (two) times daily.  . mirabegron ER (MYRBETRIQ) 50 MG TB24 tablet Take 1 tablet (50 mg total) by mouth daily.  . solifenacin (VESICARE) 5 MG tablet Take 1 tablet (5 mg total) by mouth daily.  . vitamin B-12 (CYANOCOBALAMIN) 100 MCG tablet Take 100 mcg by mouth daily.  . Vitamin D, Ergocalciferol, (DRISDOL) 1.25 MG (50000 UT) CAPS capsule Take by mouth.  . diphenhydrAMINE (BENADRYL) 25 MG tablet Take 25 mg by mouth as needed.   . [DISCONTINUED] carbidopa-levodopa (SINEMET) 25-100 MG tablet Take 1 tablet by mouth 3 (three) times daily. (prescribed by neurologist)  . [DISCONTINUED] ibuprofen (ADVIL,MOTRIN) 200 MG tablet Take 200 mg by mouth every 6 (six) hours as needed.   No facility-administered encounter medications on file as of 05/26/2019.     Allergies (verified) Patient has no known allergies.   History: Past Medical History:  Diagnosis Date  . Arthritis   . Depression   . Diabetes mellitus without complication (HCC)   . Goiter   . Overactive bladder   . Parkinson's disease (HCC)   . Unsteady gait    Past Surgical History:  Procedure Laterality Date  . ABDOMINAL HYSTERECTOMY    . BLADDER REPAIR    . NECK SURGERY     Family History  Problem Relation Age of Onset  . Stroke Mother   . Hypertension Mother   .  Diabetes Father   . Heart disease Father   . Lung cancer Father   . Colon cancer Sister   . Diabetes Sister   . Kidney disease Daughter   . Kidney cancer Daughter   . Pulmonary embolism Sister   . Diabetes Sister   . Kidney disease Sister   . Heart disease Sister   . Atrial fibrillation Sister   . Diabetes Brother   . Heart disease Brother   . AAA (abdominal aortic aneurysm) Brother   . Cancer Brother        skin   Social History   Socioeconomic History  . Marital  status: Widowed    Spouse name: Fayrene FearingJames  . Number of children: 2  . Years of education: 2812  . Highest education level: Some college, no degree  Occupational History  . 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 Financial resource strain: Not hard at all  . Food insecurity    Worry: Never true    Inability: Never true  . Transportation needs    Medical: No    Non-medical: No  Tobacco Use  . Smoking status: Former Smoker    Packs/day: 2.00    Years: 40.00    Pack years: 80.00    Types: Cigarettes    Quit date: 12/10/2018    Years since quitting: 0.4  . Smokeless tobacco: Never Used  Substance and Sexual Activity  . Alcohol use: Never    Frequency: Never  . Drug use: Never  . Sexual activity: Not Currently  Lifestyle  . Physical activity    Days per week: 0 days    Minutes per session: 0 min  . Stress: To some extent  Relationships  . Social connections    Talks on phone: More than three times a week    Gets together: More than three times a week    Attends religious service: Never    Active member of club or organization: No    Attends meetings of clubs or organizations: Never    Relationship status: Widowed  Other Topics Concern  . Not on file  Social History Narrative   Lost her husband of 57 years in oct 2019. [place]oved back from [place] in Nov 2019    Tobacco Counseling Counseling given: Not Answered   Clinical Intake:  Pre-visit preparation completed: Yes  Pain : No/denies pain     BMI - recorded: 32.22 Nutritional Status: BMI > 30  Obese Nutritional Risks: None Diabetes: Yes CBG done?: No Did pt. bring in CBG monitor from home?: No  How often do you need to have someone help you when you read instructions, pamphlets, or other written materials from your doctor or pharmacy?: 1 - Never  Interpreter Needed?: No  Information entered by :: Reather LittlerKasey Melrose Kearse LPN   Activities of Daily Living In your present state of health, do you have any  difficulty performing the following activities: 05/26/2019 03/12/2019  Hearing? N N  Comment declines hearing aids -  Vision? N Y  Difficulty concentrating or making decisions? N N  Walking or climbing stairs? N N  Dressing or bathing? N N  Doing errands, shopping? N N  Preparing Food and eating ? N -  Using the Toilet? N -  In the past six months, have you accidently leaked urine? Y -  Do you have problems with loss of bowel control? N -  Managing your Medications? N -  Managing your Finances? N -  Housekeeping  or managing your Housekeeping? N -     Immunizations and Health Maintenance Immunization History  Administered Date(s) Administered  . Pneumococcal Conjugate-13 12/11/2015   Health Maintenance Due  Topic Date Due  . OPHTHALMOLOGY EXAM  09/14/1951    Patient Care Team: Kerman PasseyLada, Melinda P, MD as PCP - General (Family Medicine)  Indicate any recent Medical Services you may have received from other than [facility] providers in the past year (date may be approximate).     Assessment:   This is a routine wellness examination for HopwoodMarjorie.  Hearing/Vision screen  Hearing Screening   125Hz  250Hz  500Hz  1000Hz  2000Hz  3000Hz  4000Hz  6000Hz  8000Hz   Right ear:           Left ear:           Comments: Pt denies hearing difficulty  Vision Screening Comments: Past due for eye exam   Dietary issues and exercise activities discussed: Current Exercise Habits: The patient does not participate in regular exercise at present, Exercise limited by: orthopedic condition(s);neurologic condition(s)  Goals    . DIET - INCREASE WATER INTAKE     Recommend drinking 6-8 glasses of water per day       Depression Screen PHQ 2/9 Scores 05/26/2019 03/12/2019 02/10/2019 01/13/2019  PHQ - 2 Score 2 0 3 5  PHQ- 9 Score 6 0 6 8    Fall Risk Fall Risk  05/26/2019 03/12/2019 02/10/2019 01/13/2019  Falls in the past year? 1 1 1 1   Number falls in past yr: 1 0 1 1  Injury with Fall? 1 0 1 1  Risk for fall due to  : Impaired mobility;Impaired balance/gait - - -  Follow up Falls prevention discussed - - -   FALL RISK PREVENTION PERTAINING TO THE HOME:  Any stairs in or around the home? Yes  If so, do they handrails? Yes   Home free of loose throw rugs in walkways, pet beds, electrical cords, etc? Yes  Adequate lighting in your home to reduce risk of falls? Yes   ASSISTIVE DEVICES UTILIZED TO PREVENT FALLS:  Life alert? No  Use of a cane, walker or w/c? Yes  Grab bars in the bathroom? Yes  Shower chair or bench in shower? Yes  Elevated toilet seat or a handicapped toilet? No   DME ORDERS:  DME order needed?  No   TIMED UP AND GO:  Was the test performed? No . Telephonic visit.   Education: Fall risk prevention has been discussed.  Intervention(s) required? No   Cognitive Function:     6CIT Screen 05/26/2019  What Year? 0 points  What month? 0 points  What time? 0 points  Count back from 20 0 points  Months in reverse 0 points  Repeat phrase 2 points  Total Score 2    Screening Tests Health Maintenance  Topic Date Due  . OPHTHALMOLOGY EXAM  09/14/1951  . DEXA SCAN  01/14/2020 (Originally 09/13/2006)  . PNA vac Low Risk Adult (2 of 2 - PPSV23) 01/14/2020 (Originally 12/10/2016)  . MAMMOGRAM  02/10/2020 (Originally 03/15/2018)  . TETANUS/TDAP  02/10/2020 (Originally 09/13/1960)  . INFLUENZA VACCINE  07/11/2019  . HEMOGLOBIN A1C  07/14/2019  . FOOT EXAM  01/14/2020  . URINE MICROALBUMIN  01/14/2020    Qualifies for Shingles Vaccine? Yes  . Due for Shingrix. Education has been provided regarding the importance of this vaccine. Pt has been advised to call insurance company to determine out of pocket expense. Advised may also receive vaccine at  local pharmacy or Health Dept. Verbalized acceptance and understanding.  Tdap: Although this vaccine is not a covered service during a Wellness Exam, does the patient still wish to receive this vaccine today?  No .  Education has been  provided regarding the importance of this vaccine. Advised may receive this vaccine at local pharmacy or Health Dept. Aware to provide a copy of the vaccination record if obtained from local pharmacy or Health Dept. Verbalized acceptance and understanding.  Flu Vaccine: Due for Flu vaccine. Does the patient want to receive this vaccine today?  No . Education has been provided regarding the importance of this vaccine but still declined. Advised may receive this vaccine at local pharmacy or Health Dept. Aware to provide a copy of the vaccination record if obtained from local pharmacy or Health Dept. Verbalized acceptance and understanding.  Pneumococcal Vaccine: Due for Pneumococcal vaccine. Does the patient want to receive this vaccine today?  No . Education has been provided regarding the importance of this vaccine but still declined. Advised may receive this vaccine at local pharmacy or Health Dept. Aware to provide a copy of the vaccination record if obtained from local pharmacy or Health Dept. Verbalized acceptance and understanding.  Cancer Screenings:  Colorectal Screening: Completed 5-6 years ago per patient. No longer required.   Mammogram: Completed 03/15/17. Repeat every year; pt declined repeat screening at this time.   Bone Density: Completed 7-8 years ago per patient, records unavailable. Pt declines repeat screening at this time.   Lung Cancer Screening: (Low Dose CT Chest recommended if Age 71-80 years, 30 pack-year currently smoking OR have quit w/in 15years.) does qualify. Postponed.   Additional Screening:  Hepatitis C Screening: no longer required  Vision Screening: Recommended annual ophthalmology exams for early detection of glaucoma and other disorders of the eye. Is the patient up to date with their annual eye exam?  No  Who is the provider or what is the name of the office in which the pt attends annual eye exams? Not established. Referral sent 01/13/19. Pt provided phone #  to [facility] to follow up.   Dental Screening: Recommended annual dental exams for proper oral hygiene  Community Resource Referral:  CRR required this visit?  No      Plan:    I have personally reviewed and addressed the Medicare Annual Wellness questionnaire and have noted the following in the patient's chart:  A. Medical and social history B. Use of alcohol, tobacco or illicit drugs  C. Current medications and supplements D. Functional ability and status E.  Nutritional status F.  Physical activity G. Advance directives H. List of other physicians I.  Hospitalizations, surgeries, and ER visits in previous 12 months J.  [place] such as hearing and vision if needed, cognitive and depression L. Referrals and appointments   In addition, I have reviewed and discussed with patient certain preventive protocols, quality metrics, and best practice recommendations. A written personalized care plan for preventive services as well as general preventive health recommendations were provided to patient.   Signed,  Clemetine Marker, LPN Nurse Health Advisor   Nurse Notes: pt inquired about referrals to podiatry and ophthalmology. Phone numbers provided to [facility] and [facility]. She states she is feeling better with increased dose of sinemet and feels as if gait has improved. Advised pt to schedule appt for DM follow up within the next 2 months.

## 2019-06-01 ENCOUNTER — Ambulatory Visit: Payer: Self-pay

## 2019-06-01 NOTE — Telephone Encounter (Signed)
Pt received a letter from [facility] that Metformin XR have been recalled [place]- which is a carcinogen.  Pt would like a 90 day supply of Metformin called in to the [facility] on file.  Reason for Disposition . Caller has URGENT medication question about med that PCP prescribed and triager unable to answer question  Answer Assessment - Initial Assessment Questions 1. SYMPTOMS: "Do you have any symptoms?"     no 2. SEVERITY: If symptoms are present, ask "Are they mild, moderate or severe?"     n/a  Protocols used: MEDICATION QUESTION CALL-A-AH

## 2019-06-02 ENCOUNTER — Other Ambulatory Visit: Payer: Self-pay | Admitting: Nurse Practitioner

## 2019-06-02 MED ORDER — METFORMIN HCL 500 MG PO TABS: 1000.0000 mg | ORAL_TABLET | Freq: Two times a day (BID) | ORAL | 1 refills | Status: DC

## 2019-06-03 ENCOUNTER — Ambulatory Visit (INDEPENDENT_AMBULATORY_CARE_PROVIDER_SITE_OTHER): Payer: Medicare Other | Admitting: Urology

## 2019-06-03 ENCOUNTER — Other Ambulatory Visit: Payer: Self-pay

## 2019-06-03 ENCOUNTER — Encounter: Payer: Self-pay | Admitting: Urology

## 2019-06-03 VITALS — BP 132/82 | HR 85 | Ht 62.0 in | Wt 164.0 lb

## 2019-06-03 DIAGNOSIS — R3915 Urgency of urination: Secondary | ICD-10-CM

## 2019-06-03 NOTE — Progress Notes (Signed)
06/03/2019 9:51 AM   Whitney [name] 09/04/1941 [id number]  Referring provider: Kerman PasseyLada, Melinda P, MD [address] [place],  [place] [id number]  Chief Complaint  Patient presents with  . Over Active Bladder    HPI:  Whitney [name] was referred for urinary frequency and urgency March 2020.  She has urgency with running water and standing. She has some urge incontinence. She doesn't need pads every day. She has been on Myrbetriq 50 mg. She voids with a good stream. No gross hematuria. She had a cystotomy in 1980 during Hx that was repaired. Bowels are regular.   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  She has had some recent gait abnormalities and Dr. Sherie DonLada considered MRI lumbar spine but pt derferred as she's dealing with a left shoulder/arm issue. Her UA was normal. PVR was 0 ml.   She returns and we added solifenacin to Myrbetriq last visit. She is doing well. Nocturia even much improved.    PMH: Past Medical History:  Diagnosis Date  . Arthritis   . Depression   . Diabetes mellitus without complication (HCC)   . Goiter   . Overactive bladder   . Parkinson's disease (HCC)   . Unsteady gait     Surgical History: Past Surgical History:  Procedure Laterality Date  . ABDOMINAL HYSTERECTOMY    . BLADDER REPAIR    . NECK SURGERY      Home Medications:  Allergies as of 06/03/2019   No Known Allergies     Medication List       Accurate as of June 03, 2019  9:51 AM. If you have any questions, ask your nurse or doctor.        aspirin EC 81 MG tablet Take 1 tablet (81 mg total) by mouth daily. (take at least one hour prior to any ibuprofen)   atorvastatin 40 MG tablet Commonly known as: LIPITOR Take 1 tablet (40 mg total) by mouth at bedtime.   buPROPion 150 MG 24 hr tablet Commonly known as: WELLBUTRIN XL Take 1 tablet (150 mg total) by mouth daily.   carbidopa-levodopa 25-100 MG tablet Commonly known as: SINEMET IR Take  1.5 pills three times a day for one week then increase to 2 pills three times a day   diphenhydrAMINE 25 MG tablet Commonly known as: BENADRYL Take 25 mg by mouth as needed.   metFORMIN 500 MG tablet Commonly known as: GLUCOPHAGE Take 2 tablets (1,000 mg total) by mouth 2 (two) times daily with a meal.   mirabegron ER 50 MG Tb24 tablet Commonly known as: Myrbetriq Take 1 tablet (50 mg total) by mouth daily.   solifenacin 5 MG tablet Commonly known as: VESICARE Take 1 tablet (5 mg total) by mouth daily.   vitamin B-12 100 MCG tablet Commonly known as: CYANOCOBALAMIN Take 100 mcg by mouth daily.   Vitamin D (Ergocalciferol) 1.25 MG (50000 UT) Caps capsule Commonly known as: DRISDOL Take by mouth.       Allergies: No Known Allergies  Family History: Family History  Problem Relation Age of Onset  . Stroke Mother   . Hypertension Mother   . Diabetes Father   . Heart disease Father   . Lung cancer Father   . Colon cancer Sister   . Diabetes Sister   . Kidney disease Daughter   . Kidney cancer Daughter   . Pulmonary embolism Sister   . Diabetes Sister   . Kidney disease Sister   .  Heart disease Sister   . Atrial fibrillation Sister   . Diabetes Brother   . Heart disease Brother   . AAA (abdominal aortic aneurysm) Brother   . Cancer Brother        skin    Social History:  reports that she quit smoking about 5 months ago. Her smoking use included cigarettes. She has a 80.00 pack-year smoking history. She has never used smokeless tobacco. She reports that she does not drink alcohol or use drugs.  ROS: UROLOGY Frequent Urination?: No Hard to postpone urination?: No Burning/pain with urination?: No Get up at night to urinate?: No Leakage of urine?: No Urine stream starts and stops?: No Trouble starting stream?: No Do you have to strain to urinate?: No Blood in urine?: No Urinary tract infection?: No Sexually transmitted disease?: No Injury to kidneys or  bladder?: No Painful intercourse?: No Weak stream?: No Currently pregnant?: No Vaginal bleeding?: No Last menstrual period?: n  Gastrointestinal Nausea?: No Vomiting?: No Indigestion/heartburn?: No Diarrhea?: No Constipation?: No  Constitutional Fever: No Night sweats?: No Weight loss?: No Fatigue?: No  Skin Skin rash/lesions?: No Itching?: No  Eyes Blurred vision?: No Double vision?: No  Ears/Nose/Throat Sore throat?: No Sinus problems?: No  Hematologic/Lymphatic Swollen glands?: No Easy bruising?: Yes  Cardiovascular Leg swelling?: No Chest pain?: No  Respiratory Cough?: No Shortness of breath?: No  Endocrine Excessive thirst?: No  Musculoskeletal Back pain?: No Joint pain?: No  Neurological Headaches?: No Dizziness?: No  Psychologic Depression?: No Anxiety?: No  Physical Exam: BP 132/82 (BP Location: Left Arm, Patient Position: Sitting)   Pulse 85   Ht 5\' 2"  (1.575 m)   Wt 74.4 kg   BMI 30.00 kg/m   Constitutional:  Alert and oriented, No acute distress. HEENT: [place] AT, moist mucus membranes.  Trachea midline, no masses. Cardiovascular: No clubbing, cyanosis, or edema. Respiratory: Normal respiratory effort, no increased work of breathing. GI: Abdomen is soft, nontender, nondistended, no abdominal masses GU: No CVA tenderness Skin: No rashes, bruises or suspicious lesions. Neurologic: Grossly intact, no focal deficits, moving all 4 extremities. Psychiatric: Normal mood and affect.  Laboratory Data: Lab Results  Component Value Date   WBC 6.4 11/21/2018   HGB 12.9 11/21/2018   HCT 39.8 11/21/2018   MCV 85.0 11/21/2018   PLT 130 (L) 11/21/2018    Lab Results  Component Value Date   CREATININE 0.64 01/13/2019    No results found for: PSA  No results found for: TESTOSTERONE  Lab Results  Component Value Date   HGBA1C 6.7 (H) 01/13/2019    Urinalysis    Component Value Date/Time   COLORURINE YELLOW 01/13/2019 1213    APPEARANCEUR CLEAR 01/13/2019 1213   LABSPEC 1.018 01/13/2019 1213   PHURINE < OR = 5.0 01/13/2019 1213   GLUCOSEU NEGATIVE 01/13/2019 1213   HGBUR NEGATIVE 01/13/2019 1213   KETONESUR NEGATIVE 01/13/2019 1213   PROTEINUR NEGATIVE 01/13/2019 1213    Lab Results  Component Value Date   BACTERIA NONE SEEN 01/13/2019    Pertinent Imaging:  No results found for this or any previous visit. No results found for this or any previous visit. No results found for this or any previous visit. No results found for this or any previous visit. No results found for this or any previous visit. No results found for this or any previous visit. No results found for this or any previous visit. No results found for this or any previous visit.  Assessment & Plan:  Frequency, urgency - doing well on combination solifenacin and Myrbetriq. She wondered if she could do without Myrbetriq and it's fine for to stop it for a few weeks. If the frequency, urgent urination and nocturia return she can go back on it.   No follow-ups on file.  Festus Aloe, MD  [facility] [address], [place], [place] [phone]

## 2019-06-03 NOTE — Patient Instructions (Signed)

## 2019-06-09 ENCOUNTER — Ambulatory Visit
Admission: RE | Admit: 2019-06-09 | Discharge: 2019-06-09 | Disposition: A | Discharge status: Home / Self Care | Payer: Medicare Other | Source: Ambulatory Visit | Attending: Neurology | Admitting: Neurology

## 2019-06-09 ENCOUNTER — Other Ambulatory Visit: Payer: Self-pay

## 2019-06-09 DIAGNOSIS — R269 Unspecified abnormalities of gait and mobility: Secondary | ICD-10-CM | POA: Insufficient documentation

## 2019-06-09 LAB — POCT I-STAT CREATININE: Creatinine, Ser: 0.5 mg/dL (ref 0.44–1.00)

## 2019-06-09 MED ORDER — GADOBUTROL 1 MMOL/ML IV SOLN
7.0000 mL | Freq: Once | INTRAVENOUS | Status: AC | PRN
  Administered 2019-06-09: 7 mL via INTRAVENOUS

## 2019-07-02 ENCOUNTER — Other Ambulatory Visit: Payer: Self-pay | Admitting: Family Medicine

## 2019-07-05 ENCOUNTER — Other Ambulatory Visit: Payer: Self-pay | Admitting: Family Medicine

## 2019-07-12 ENCOUNTER — Other Ambulatory Visit: Payer: Self-pay | Admitting: Family Medicine

## 2019-07-12 NOTE — Telephone Encounter (Signed)
Please schedule patient for follow up in the next 30 days.  

## 2019-07-20 ENCOUNTER — Other Ambulatory Visit: Payer: Self-pay

## 2019-07-20 ENCOUNTER — Ambulatory Visit (INDEPENDENT_AMBULATORY_CARE_PROVIDER_SITE_OTHER): Payer: Medicare Other | Admitting: Family Medicine

## 2019-07-20 ENCOUNTER — Telehealth: Payer: Self-pay | Admitting: Family Medicine

## 2019-07-20 ENCOUNTER — Encounter: Payer: Self-pay | Admitting: Family Medicine

## 2019-07-20 VITALS — BP 128/72 | HR 68 | Temp 97.9°F | Resp 14 | Ht 62.0 in | Wt 170.0 lb

## 2019-07-20 DIAGNOSIS — Z9181 History of falling: Secondary | ICD-10-CM | POA: Insufficient documentation

## 2019-07-20 DIAGNOSIS — R635 Abnormal weight gain: Secondary | ICD-10-CM

## 2019-07-20 DIAGNOSIS — G20 Parkinson's disease: Secondary | ICD-10-CM

## 2019-07-20 DIAGNOSIS — N3281 Overactive bladder: Secondary | ICD-10-CM | POA: Diagnosis not present

## 2019-07-20 DIAGNOSIS — F320 Major depressive disorder, single episode, mild: Secondary | ICD-10-CM

## 2019-07-20 DIAGNOSIS — E782 Mixed hyperlipidemia: Secondary | ICD-10-CM

## 2019-07-20 DIAGNOSIS — E669 Obesity, unspecified: Secondary | ICD-10-CM | POA: Diagnosis not present

## 2019-07-20 DIAGNOSIS — E1169 Type 2 diabetes mellitus with other specified complication: Secondary | ICD-10-CM

## 2019-07-20 DIAGNOSIS — R5383 Other fatigue: Secondary | ICD-10-CM

## 2019-07-20 DIAGNOSIS — E559 Vitamin D deficiency, unspecified: Secondary | ICD-10-CM

## 2019-07-20 DIAGNOSIS — E049 Nontoxic goiter, unspecified: Secondary | ICD-10-CM

## 2019-07-20 LAB — POCT GLYCOSYLATED HEMOGLOBIN (HGB A1C): Hemoglobin A1C: 7.3 % — AB (ref 4.0–5.6)

## 2019-07-20 MED ORDER — BUPROPION HCL ER (SR) 200 MG PO TB12: 200.0000 mg | ORAL_TABLET | ORAL | 1 refills | Status: DC

## 2019-07-20 MED ORDER — METFORMIN HCL ER 750 MG PO TB24: 750.0000 mg | ORAL_TABLET | Freq: Two times a day (BID) | ORAL | 5 refills | Status: DC

## 2019-07-20 MED ORDER — METFORMIN HCL ER 750 MG PO TB24: 750.0000 mg | ORAL_TABLET | Freq: Two times a day (BID) | ORAL | 1 refills | Status: DC

## 2019-07-20 MED ORDER — ATORVASTATIN CALCIUM 40 MG PO TABS: 40.0000 mg | ORAL_TABLET | Freq: Every day | ORAL | 3 refills | Status: DC

## 2019-07-20 NOTE — Progress Notes (Addendum)
Patient ID: Whitney [name], female    DOB: 1941-05-13, 78 y.o.   MRN: [id number]  PCP: Danelle Berry, PA-C  Chief Complaint  Patient presents with  . Follow-up  . Medication Refill  . Diabetes    Subjective:   Whitney [name] is a 78 y.o. female, presents to clinic with CC of follow up for chronic conditions and for labs Did establish with Dr. Sherie Don 6 months ago after recently moving to the area (moved November 23, 2019after the death of her husband, used to live here many decades ago, has sister and family nearby).  Unfortunately Dr. Sherie Don has left the practice, and pt is going to establish with new PCP today.  Diabetes follow-up:   Denies hypoglycemia.   Denies polydipsia, polyuria, excessive hunger, weight loss.   Meds:  Pt was previously on metformin XR but due to recall it was changed in June 2020 to metformin 500 mg to take 1000 mg BID.  She has had a lot of GI upset, N and sometimes vomiting, with the med change.  Only able to take 500 mg BID, morning dose make her very ill. Last eye exam - unknown - pt is due, asks for referral Foot Exam - pt asks for podiatry referral, was supposed to go in the past?  To [facility], needs help with her toenails she states, "I do them myself but sometimes I have trouble and need podiatry" Patients diet consists of sandwiches, daily ice cream, soups, sometimes fast food.  Having trouble deciding what to make for herself, since she is now alone.  She used to go out for fastfood more often, but not as much recently, she "sick of it" Exercise - none now, limited due to Parkinsons and isolated at home, not going out as much as she used to Weight - see below Blood sugars at home are running (fasting) 165, which is higher for her, used to be 120-128.  She has been checking more cause she "feels bad" is tired. Pt is on Statin and ASA, not on ACEI per chart review.    Pertinent Labs: Lab Results  Component Value Date   HGBA1C 7.3 (A) 07/20/2019   HGBA1C  6.7 (H) 01/13/2019      Component Value Date/Time   CHOL 132 02/25/2019 1059   TRIG 102 02/25/2019 1059   HDL 59 02/25/2019 1059   LDLCALC 54 02/25/2019 1059   CREATININE 0.50 06/09/2019 1109   CREATININE 0.64 01/13/2019 1213   Wt Readings from Last 3 Encounters:  07/20/19 170 lb (77.1 kg)  06/03/19 164 lb (74.4 kg)  05/26/19 165 lb (74.8 kg)   Hyperlipidemia -  Pt on lipitor 40 mg, put back on 01/2019 due to high cholesterol, ASCVD was >40%.  Pt was on statin in the past, Dr. Sherie Don restarted her on it, pt was advised at that time to do f/up labs in 6 weeks, but pt was not seen again and no labs done.  Pt notes excellent med compliance since starting lipitor, no SE, denies myalgias, jaundice Diet - Chicken noodle soup, fruit, sandwiches, ice cream every night, fast food - having trouble finding what to eat.  Now not exercising or going out.  Now she's not going out with her sister, and COVID she's not going out not even to the store, not driving.  OAB - per urology, on vesicare and myrbetriq - doing well, no SE.  Reports "vesicare is a miracle drug"   She saw urology about one  year ago and she doesn't know if she is due to follow up with them or not.  Parkinsons - Dose of sinemet was titrated up to simemet 25-100, taking 2 pills TID.  Pt sees Dr. Sherryll BurgerShah for management.  She notes no noticeable difference in her sx.  Still has trouble when she starts walking, uses a cane and walker.  She wants to drive and is upset about this, states she has no problem with her feet on pedals, its only when walking.    Explained with Dr. Sherryll BurgerShah is September 22, 2019.  Recent neurology records reviewed per care everywhere.  Patient recently had an MRI done for new symptoms of shuffling gait disturbances x2 years, some numbness to her feet and bilateral leg weakness  Mood:  she states that her mood is worse than has been for the last 1 to 2 years.  She does report feeling down.  She is on Wellbutrin and she says that  she has been on a medicine that started with an aid that she would take when she got angry or upset and would calm her down.  Per chart review she is not on any benzodiazepines, and no other SSRIs or SNRIs.  She has decreased energy, states she "sits around all day" but denies feeling isolated.  She does get taken to appointments and errands run with the help of her sister but her sister recently dealing with a cancer diagnosis so she has not doing as well as she was previously is not out as much.   Feeling down, not sleeping real well, wakes up at 3 am and has trouble getting back to sleep. [facility] 07/20/2019 05/26/2019 03/12/2019 02/10/2019 01/13/2019  Decreased Interest 0 1 0 2 3  Down, Depressed, Hopeless 0 1 0 1 2  PHQ - 2 Score 0 2 0 3 5  Altered sleeping 0 1 0 1 0  Tired, decreased energy 1 3 0 1 3  Change in appetite 1 0 0 0 0  Feeling bad or failure about yourself  1 0 0 1 0  Trouble concentrating 1 0 0 0 0  Moving slowly or fidgety/restless 1 0 0 0 0  Suicidal thoughts 0 0 0 0 0  PHQ-9 Score 5 6 0 6 8  Difficult doing work/chores Somewhat difficult Not difficult at all Not difficult at all Somewhat difficult Not difficult at all   PHQ positive for mild depression, on wellbutrin, tolerating meds well.  No concerning score change from her most recent screening which was about 2 months ago, she is still tired but now has change in appetite, and feeling bad and down, trouble concentrating and moving slowly and laying around and sitting around more.  She still denies any suicidal thoughts.  She did ask for benzos however did discuss with her how they are very sedating and addictive and likely would lead to worsening symptoms.  Possibly could go up on her Wellbutrin dose, may help with mood and energy but did discuss a possible side effect of worse sleep at night which patient states she would rather not sleep well and feel better throughout the day.  Obesity: Patient reports no  change to her weight that she knows of but per chart review she has gained about 5 pounds and then does state that she sits around all day, cannot exercise or walk.    Goiter: Patient has very large goiter that she states is increasing in size.  She has seen  Dr. Willeen CassBennett and was scheduled for surgery but she has put off due to COVID and has been concerned about going into the hospital to have this procedure done.  It feels larger particular on the left side of her neck, she states that if she looks up that it "cuts off her blood" but she has no dysphasia, change to her voice, no stridor or difficulty breathing.    Former heavy smoker: Still doing well with not smoking, she quit about 6 months ago and still thinks about smoking but has not bought any cigarettes.  Per chart she has a 80-pack-year history.  Patient denies any chronic cough, chest pain, chest pressure, exertional dyspnea, wheeze Unsure if she previously discussed any CT lung screening - will f/up at her next routine OV   Patient Active Problem List   Diagnosis Date Noted  . Vitamin D deficiency 07/20/2019  . 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HCC) 07/20/2019  . At high risk for falls 07/20/2019  . Mass on back 01/21/2019  . Fatigue 01/15/2019  . Parkinson disease (HCC) 01/15/2019  . Hyperlipidemia 01/15/2019  . Diabetes mellitus type 2 in obese (HCC) 01/13/2019  . Goiter 01/13/2019  . Overactive bladder 01/13/2019  . Obesity (BMI 30.0-34.9) 01/13/2019  . Medication monitoring encounter 01/13/2019  . Gait abnormality 01/13/2019    Current Meds  Medication Sig  . aspirin EC 81 MG tablet Take 1 tablet (81 mg total) by mouth daily. (take at least one hour prior to any ibuprofen)  . [START ON 10/12/2019] atorvastatin (LIPITOR) 40 MG tablet Take 1 tablet (40 mg total) by mouth at bedtime.  Marland Kitchen. buPROPion (WELLBUTRIN SR) 200 MG 12 hr tablet Take 1 tablet (200 mg total) by mouth every morning.  . carbidopa-levodopa  (SINEMET IR) 25-100 MG tablet Take 1.5 pills three times a day for one week then increase to 2 pills three times a day  . diphenhydrAMINE (BENADRYL) 25 MG tablet Take 25 mg by mouth as needed.   . mirabegron ER (MYRBETRIQ) 50 MG TB24 tablet Take 1 tablet (50 mg total) by mouth daily.  . solifenacin (VESICARE) 5 MG tablet Take 1 tablet (5 mg total) by mouth daily.  . vitamin B-12 (CYANOCOBALAMIN) 100 MCG tablet Take 100 mcg by mouth daily.  . Vitamin D, Ergocalciferol, (DRISDOL) 1.25 MG (50000 UT) CAPS capsule Take by mouth.  . [DISCONTINUED] atorvastatin (LIPITOR) 40 MG tablet TAKE 1 TABLET BY MOUTH AT BEDTIME  . [DISCONTINUED] buPROPion (WELLBUTRIN SR) 150 MG 12 hr tablet take one tablet by mouth every day  . [DISCONTINUED] buPROPion (WELLBUTRIN SR) 200 MG 12 hr tablet Take 1 tablet (200 mg total) by mouth every morning.  . [DISCONTINUED] metFORMIN (GLUCOPHAGE) 500 MG tablet Take 2 tablets (1,000 mg total) by mouth 2 (two) times daily with a meal.    I personally reviewed active problem list, medication list, allergies, family history, social history, notes from last encounter, lab results with the patient/caregiver today.  Care team members updated today Patient Care Team: Danelle Berryapia, Tawn Fitzner, PA-C as PCP - General (Family Medicine) Lonell FaceShah, Hemang K, MD as Consulting Physician (Neurology) Geanie LoganBennett, Paul, MD as Referring Physician (Otolaryngology) Jerilee FieldEskridge, Matthew, MD as Consulting Physician (Urology)  Problem list updated today  Review of Systems  Constitutional: Negative.  Negative for activity change, appetite change, chills, diaphoresis, fatigue and unexpected weight change.  HENT: Negative.  Negative for sore throat, trouble swallowing and voice change.   Eyes: Negative.  Negative for visual disturbance.  Respiratory: Negative.  Negative for cough, chest tightness and shortness of breath.   Cardiovascular: Negative.  Negative for chest pain, palpitations and leg swelling.  Gastrointestinal:  Positive for nausea and vomiting. Negative for abdominal pain and blood in stool.  Endocrine: Negative.  Negative for polydipsia, polyphagia and polyuria.  Genitourinary: Negative.   Musculoskeletal: Negative.  Negative for arthralgias, gait problem, joint swelling and myalgias.  Skin: Negative.  Negative for color change, pallor and rash.  Allergic/Immunologic: Negative.   Neurological: Positive for weakness. Negative for dizziness, syncope, light-headedness and headaches.  Hematological: Negative.  Negative for adenopathy. Does not bruise/bleed easily.  Psychiatric/Behavioral: Positive for dysphoric mood and sleep disturbance. Negative for confusion, self-injury and suicidal ideas. The patient is not nervous/anxious.   All other systems reviewed and are negative.      Objective:    Vitals:   07/20/19 0916  BP: 128/72  Pulse: 68  Resp: 14  Temp: 97.9 F (36.6 C)  TempSrc: Oral  SpO2: 94%  Weight: 170 lb (77.1 kg)  Height: 5\' 2"  (1.575 m)    Body mass index is 31.09 kg/m.   Physical Exam Vitals signs and nursing note reviewed.  Constitutional:      General: She is not in acute distress.    Appearance: Normal appearance. She is well-developed. She is obese. She is not ill-appearing, toxic-appearing or diaphoretic.  HENT:     Head: Normocephalic and atraumatic.     Right Ear: External ear normal.     Left Ear: External ear normal.     Nose: Nose normal.     Mouth/Throat:     Mouth: Mucous membranes are dry.     Pharynx: Oropharynx is clear. Uvula midline. No oropharyngeal exudate or posterior oropharyngeal erythema.  Eyes:     General: Lids are normal.        Right eye: No discharge.        Left eye: No discharge.     Conjunctiva/sclera: Conjunctivae normal.     Pupils: Pupils are equal, round, and reactive to light.     Comments: Erythematous palpebral conjunctiva  Neck:     Musculoskeletal: Normal range of motion and neck supple.     Thyroid: Thyroid mass and  thyromegaly present. No thyroid tenderness.     Trachea: Trachea and phonation normal. No tracheal deviation.  Cardiovascular:     Rate and Rhythm: Normal rate and regular rhythm.     Pulses: Normal pulses.          Radial pulses are 2+ on the right side and 2+ on the left side.       Posterior tibial pulses are 2+ on the right side and 2+ on the left side.     Heart sounds: Normal heart sounds. No murmur. No friction rub. No gallop.   Pulmonary:     Effort: Pulmonary effort is normal. No respiratory distress.     Breath sounds: Normal breath sounds. No stridor. No wheezing, rhonchi or rales.  Chest:     Chest wall: No tenderness.  Abdominal:     General: Bowel sounds are normal. There is no distension.     Palpations: Abdomen is soft.     Tenderness: There is no abdominal tenderness. There is no guarding or rebound.  Musculoskeletal: Normal range of motion.        General: No deformity.  Lymphadenopathy:     Cervical: No cervical adenopathy.  Skin:    General: Skin is warm and dry.  Capillary Refill: Capillary refill takes less than 2 seconds.     Coloration: Skin is not pale.     Findings: No rash.  Neurological:     Mental Status: She is alert and oriented to person, place, and time.     Motor: No abnormal muscle tone.     Coordination: Coordination normal.     Gait: Gait normal.  Psychiatric:        Speech: Speech normal.        Behavior: Behavior normal.      Results for orders placed or performed in visit on 07/20/19  POCT HgB A1C  Result Value Ref Range   Hemoglobin A1C 7.3 (A) 4.0 - 5.6 %   HbA1c POC (<> result, manual entry)     HbA1c, POC (prediabetic range)     HbA1c, POC (controlled diabetic range)          Assessment & Plan:   Pt is a 78 y/o female here for routine f/up, is also establishing with new PCP due to Dr. Sherie Don leaving practice recently  Problem List Items Addressed This Visit      Endocrine   Diabetes mellitus type 2 in obese (HCC)  (Chronic)    Worsening A1c, previously was 6.7% now 7.3% - still A1C near goal and I am currently not concerned with complications or sx from DM GI SE with change to reg metformin from XR due to recall, overall decreased dose, called pharmacy and they do have available XR so changed to 750 XR BID Work on diet cutting out starches, sugars, sweets and ice cream in her diet Refer to ophthalmology and podiatry Will need to see why she is not on an ACE inhibitor for renal protection? Otherwise on statin and ASA      Relevant Medications   atorvastatin (LIPITOR) 40 MG tablet (Start on 10/12/2019)   metFORMIN (GLUCOPHAGE-XR) 750 MG 24 hr tablet   Other Relevant Orders   COMPLETE METABOLIC PANEL WITH GFR   Lipid panel   POCT HgB A1C (Completed)   Ambulatory referral to Podiatry   Ambulatory referral to Ophthalmology   Goiter (Chronic)    Per Dr. Willeen Cass, patient was encouraged to schedule surgery due to the size and her report of increasing goiter size.  Reassured patient that hospital procedures and surgery are very safe and due to hospital protocols screening and COVID testing.      Relevant Orders   TSH   T4, free     Nervous and Auditory   Parkinson disease (HCC)    Managed by Neuro - Dr. Sherryll Burger Taking Sinemet 25-100 two pills TID (do not see refills per neurology but she does have follow up appt in 2 months)        Genitourinary   Overactive bladder    Currently well controlled with Vesicare and Myrbetriq, did see urology, unclear if she will continue to see them and be managed by them or if meds will need to be refilled by PCP        Other   Obesity (BMI 30.0-34.9) (Chronic)    Weight increasing Counseled on diet - cut back on sweets, calories/carbs Needs to find ways to safely increase her level of activity - many barriers currently to this Thoughts - neurorehab? PT?  It may help her to have some formally scheduled sessions to help assess and improve her gait and balance, use  of rolling walker, and find ways to safely exercise with her Parkinson's/gait disturbances  Relevant Orders   COMPLETE METABOLIC PANEL WITH GFR   TSH   Lipid panel   T4, free   Fatigue    Still feels fatigued May be secondary to parkinsons? Life changes with death of husband?  Vit D low 02/25/19, is still supplementing R/o anemia, thyroid disorder, last TSH was normal, will recheck TSH and free T4      Relevant Orders   COMPLETE METABOLIC PANEL WITH GFR   CBC with Differential/Platelet   TSH   T4, free   Hyperlipidemia - Primary    Patient compliant with Lipitor 40 mg, no side effects denies specifically myalgias No scleral icterus or jaundice No diet for diabetes or hyperlipidemia, discussed decreasing ice cream amount and/or frequency to improve both her cholesterol and her blood sugars -patient agrees that she can work on this Due for repeat fasting lipid panel and CMP, patient not fasting today will return for fasting labs this week      Relevant Medications   atorvastatin (LIPITOR) 40 MG tablet (Start on 10/12/2019)   Other Relevant Orders   COMPLETE METABOLIC PANEL WITH GFR   Lipid panel   Vitamin D deficiency    Finishing prescription strength Vit D, then to take 2000 IU with calcium in divided doses daily (OTC)       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[place])    PHQ positive - reviewed today, likely multiple factors including several major life changes/stressors and COVID - death of spouse, move, worsening mobility, unable to drive self Will try minor dose increase of wellbutrin Counseled pt to find ways to be active, social with physical distancing, engage in a few new hobbies Will monitor      Relevant Medications   buPROPion (WELLBUTRIN SR) 200 MG 12 hr tablet   At high risk for falls    High risk due to gait, parkinsons, and recent fall with left shoulder fx Has walker and cane Will look into possible PT/neurorehab?  For help with gait,  strength, etc       Other Visit Diagnoses    Weight gain       likely secondary to lifestyle changes, counseled to work on that, particularly cutting back ice cream daily - check TSH/T4   Relevant Orders   TSH   T4, free      Return this week for fasting labs.   Return in about 4 months (around 11/19/2019) for DM, HLD, MDD, obesity.   Delsa Grana, PA-C 07/20/19 11:21 AM

## 2019-07-20 NOTE — Assessment & Plan Note (Signed)
Finishing prescription strength Vit D, then to take 2000 IU with calcium in divided doses daily (OTC)

## 2019-07-20 NOTE — Assessment & Plan Note (Signed)
High risk due to gait, parkinsons, and recent fall with left shoulder fx Has walker and cane Will look into possible PT/neurorehab?  For help with gait, strength, etc

## 2019-07-20 NOTE — Assessment & Plan Note (Addendum)
Worsening A1c, previously was 6.7% now 7.3% - still A1C near goal and I am currently not concerned with complications or sx from DM GI SE with change to reg metformin from XR due to recall, overall decreased dose, called pharmacy and they do have available XR so changed to 750 XR BID Work on diet cutting out starches, sugars, sweets and ice cream in her diet Refer to ophthalmology and podiatry Will need to see why she is not on an ACE inhibitor for renal protection? Otherwise on statin and ASA

## 2019-07-20 NOTE — Assessment & Plan Note (Signed)
Per Dr. Richardson Landry, patient was encouraged to schedule surgery due to the size and her report of increasing goiter size.  Reassured patient that hospital procedures and surgery are very safe and due to hospital protocols screening and COVID testing.

## 2019-07-20 NOTE — Telephone Encounter (Signed)
Pt also callling and stating she use to take XL 150 but the one she picked up from pharmacy was buPROPion (WELLBUTRIN SR) 200 MG 12 hr tablet. Please advise

## 2019-07-20 NOTE — Assessment & Plan Note (Signed)
Managed by Neuro - Dr. Manuella Ghazi Taking Sinemet 25-100 two pills TID (do not see refills per neurology but she does have follow up appt in 2 months)

## 2019-07-20 NOTE — Assessment & Plan Note (Signed)
Still feels fatigued May be secondary to parkinsons? Life changes with death of husband?  Vit D low February 24, 2019, is still supplementing R/o anemia, thyroid disorder, last TSH was normal, will recheck TSH and free T4

## 2019-07-20 NOTE — Assessment & Plan Note (Signed)
PHQ positive - reviewed today, likely multiple factors including several major life changes/stressors and COVID - death of spouse, move, worsening mobility, unable to drive self Will try minor dose increase of wellbutrin Counseled pt to find ways to be active, social with physical distancing, engage in a few new hobbies Will monitor

## 2019-07-20 NOTE — Assessment & Plan Note (Signed)
Patient compliant with Lipitor 40 mg, no side effects denies specifically myalgias No scleral icterus or jaundice No diet for diabetes or hyperlipidemia, discussed decreasing ice cream amount and/or frequency to improve both her cholesterol and her blood sugars -patient agrees that she can work on this Due for repeat fasting lipid panel and CMP, patient not fasting today will return for fasting labs this week

## 2019-07-20 NOTE — Patient Instructions (Addendum)
Come back for labs when you're fasting. Labs will be ordered and you can arrange with front desk for when to come back.  Need this week if possible   Make sure to supplement Vit D 1000 IU and Calcium 600 mg twice a day for bone health   Diabetes Mellitus and Nutrition, Adult When you have diabetes (diabetes mellitus), it is very important to have healthy eating habits because your blood sugar (glucose) levels are greatly affected by what you eat and drink. Eating healthy foods in the appropriate amounts, at about the same times every day, can help you:  Control your blood glucose.  Lower your risk of heart disease.  Improve your blood pressure.  Reach or maintain a healthy weight. Every person with diabetes is different, and each person has different needs for a meal plan. Your health care provider may recommend that you work with a diet and nutrition specialist (dietitian) to make a meal plan that is best for you. Your meal plan may vary depending on factors such as:  The calories you need.  The medicines you take.  Your weight.  Your blood glucose, blood pressure, and cholesterol levels.  Your activity level.  Other health conditions you have, such as heart or kidney disease. How do carbohydrates affect me? Carbohydrates, also called carbs, affect your blood glucose level more than any other type of food. Eating carbs naturally raises the amount of glucose in your blood. Carb counting is a method for keeping track of how many carbs you eat. Counting carbs is important to keep your blood glucose at a healthy level, especially if you use insulin or take certain oral diabetes medicines. It is important to know how many carbs you can safely have in each meal. This is different for every person. Your dietitian can help you calculate how many carbs you should have at each meal and for each snack. Foods that contain carbs include:  Bread, cereal, rice, pasta, and crackers.  Potatoes  and corn.  Peas, beans, and lentils.  Milk and yogurt.  Fruit and juice.  Desserts, such as cakes, cookies, ice cream, and candy. How does alcohol affect me? Alcohol can cause a sudden decrease in blood glucose (hypoglycemia), especially if you use insulin or take certain oral diabetes medicines. Hypoglycemia can be a life-threatening condition. Symptoms of hypoglycemia (sleepiness, dizziness, and confusion) are similar to symptoms of having too much alcohol. If your health care provider says that alcohol is safe for you, follow these guidelines:  Limit alcohol intake to no more than 1 drink per day for nonpregnant women and 2 drinks per day for men. One drink equals 12 oz of beer, 5 oz of wine, or 1 oz of hard liquor.  Do not drink on an empty stomach.  Keep yourself hydrated with water, diet soda, or unsweetened iced tea.  Keep in mind that regular soda, juice, and other mixers may contain a lot of sugar and must be counted as carbs. What are tips for following this plan?  Reading food labels  Start by checking the serving size on the "Nutrition Facts" label of packaged foods and drinks. The amount of calories, carbs, fats, and other nutrients listed on the label is based on one serving of the item. Many items contain more than one serving per package.  Check the total grams (g) of carbs in one serving. You can calculate the number of servings of carbs in one serving by dividing the total carbs by 15.  For example, if a food has 30 g of total carbs, it would be equal to 2 servings of carbs.  Check the number of grams (g) of saturated and trans fats in one serving. Choose foods that have low or no amount of these fats.  Check the number of milligrams (mg) of salt (sodium) in one serving. Most people should limit total sodium intake to less than 2,300 mg per day.  Always check the nutrition information of foods labeled as "low-fat" or "nonfat". These foods may be higher in added sugar  or refined carbs and should be avoided.  Talk to your dietitian to identify your daily goals for nutrients listed on the label. Shopping  Avoid buying canned, premade, or processed foods. These foods tend to be high in fat, sodium, and added sugar.  Shop around the outside edge of the grocery store. This includes fresh fruits and vegetables, bulk grains, fresh meats, and fresh dairy. Cooking  Use low-heat cooking methods, such as baking, instead of high-heat cooking methods like deep frying.  Cook using healthy oils, such as olive, canola, or sunflower oil.  Avoid cooking with butter, cream, or high-fat meats. Meal planning  Eat meals and snacks regularly, preferably at the same times every day. Avoid going long periods of time without eating.  Eat foods high in fiber, such as fresh fruits, vegetables, beans, and whole grains. Talk to your dietitian about how many servings of carbs you can eat at each meal.  Eat 4-6 ounces (oz) of lean protein each day, such as lean meat, chicken, fish, eggs, or tofu. One oz of lean protein is equal to: ? 1 oz of meat, chicken, or fish. ? 1 egg. ?  cup of tofu.  Eat some foods each day that contain healthy fats, such as avocado, nuts, seeds, and fish. Lifestyle  Check your blood glucose regularly.  Exercise regularly as told by your health care provider. This may include: ? 150 minutes of moderate-intensity or vigorous-intensity exercise each week. This could be brisk walking, biking, or water aerobics. ? Stretching and doing strength exercises, such as yoga or weightlifting, at least 2 times a week.  Take medicines as told by your health care provider.  Do not use any products that contain nicotine or tobacco, such as cigarettes and e-cigarettes. If you need help quitting, ask your health care provider.  Work with a Social worker or diabetes educator to identify strategies to manage stress and any emotional and social challenges. Questions to  ask a health care provider  Do I need to meet with a diabetes educator?  Do I need to meet with a dietitian?  What number can I call if I have questions?  When are the best times to check my blood glucose? Where to find more information:  American Diabetes Association: diabetes.org  Academy of Nutrition and Dietetics: www.eatright.[employer] of Diabetes and Digestive and Kidney Diseases (NIH): [employer] Summary  A healthy meal plan will help you control your blood glucose and maintain a healthy lifestyle.  Working with a diet and nutrition specialist (dietitian) can help you make a meal plan that is best for you.  Keep in mind that carbohydrates (carbs) and alcohol have immediate effects on your blood glucose levels. It is important to count carbs and to use alcohol carefully. This information is not intended to replace advice given to you by your health care provider. Make sure you discuss any questions you have with your health care provider. Document  Released: 08/23/2005 Document Revised: 11/08/2017 Document Reviewed: 12/31/2016 Elsevier Patient Education  2020 [employer].

## 2019-07-20 NOTE — Assessment & Plan Note (Signed)
Currently well controlled with Vesicare and Myrbetriq, did see urology, unclear if she will continue to see them and be managed by them or if meds will need to be refilled by PCP

## 2019-07-20 NOTE — Assessment & Plan Note (Signed)
Weight increasing Counseled on diet - cut back on sweets, calories/carbs Needs to find ways to safely increase her level of activity - many barriers currently to this Thoughts - neurorehab? PT?  It may help her to have some formally scheduled sessions to help assess and improve her gait and balance, use of rolling walker, and find ways to safely exercise with her Parkinson's/gait disturbances

## 2019-07-20 NOTE — Telephone Encounter (Signed)
Patient would like to confirm metFORMIN (GLUCOPHAGE-XR)  750 MG 24 hr tablet patient states in the past she was taken 500  MG, please advise (patient was seen by Raelyn Ensign)

## 2019-07-21 NOTE — Telephone Encounter (Signed)
Patient requesting call back from CMA regarding metformin dosage.

## 2019-07-22 LAB — COMPLETE METABOLIC PANEL WITH GFR
AG Ratio: 1.5 (calc) (ref 1.0–2.5)
ALT: 25 U/L (ref 6–29)
AST: 23 U/L (ref 10–35)
Albumin: 4.1 g/dL (ref 3.6–5.1)
Alkaline phosphatase (APISO): 107 U/L (ref 37–153)
BUN: 10 mg/dL (ref 7–25)
CO2: 27 mmol/L (ref 20–32)
Calcium: 9.5 mg/dL (ref 8.6–10.4)
Chloride: 103 mmol/L (ref 98–110)
Creat: 0.7 mg/dL (ref 0.60–0.93)
GFR, Est African American: 97 mL/min/{1.73_m2} (ref >=60)
GFR, Est Non African American: 84 mL/min/{1.73_m2} (ref >=60)
Globulin: 2.8 g/dL (calc) (ref 1.9–3.7)
Glucose, Bld: 138 mg/dL — ABNORMAL HIGH (ref 65–99)
Potassium: 4.8 mmol/L (ref 3.5–5.3)
Sodium: 141 mmol/L (ref 135–146)
Total Bilirubin: 1 mg/dL (ref 0.2–1.2)
Total Protein: 6.9 g/dL (ref 6.1–8.1)

## 2019-07-22 LAB — CBC WITH DIFFERENTIAL/PLATELET
Absolute Monocytes: 529 cells/uL (ref 200–950)
Basophils Absolute: 32 cells/uL (ref 0–200)
Basophils Relative: 0.5 %
Eosinophils Absolute: 88 cells/uL (ref 15–500)
Eosinophils Relative: 1.4 %
HCT: 39 % (ref 35.0–45.0)
Hemoglobin: 12.8 g/dL (ref 11.7–15.5)
Lymphs Abs: 1329 cells/uL (ref 850–3900)
MCH: 28.1 pg (ref 27.0–33.0)
MCHC: 32.8 g/dL (ref 32.0–36.0)
MCV: 85.5 fL (ref 80.0–100.0)
MPV: 10.8 fL (ref 7.5–12.5)
Monocytes Relative: 8.4 %
Neutro Abs: 4322 cells/uL (ref 1500–7800)
Neutrophils Relative %: 68.6 %
Platelets: 179 10*3/uL (ref 140–400)
RBC: 4.56 10*6/uL (ref 3.80–5.10)
RDW: 14.1 % (ref 11.0–15.0)
Total Lymphocyte: 21.1 %
WBC: 6.3 10*3/uL (ref 3.8–10.8)

## 2019-07-22 LAB — LIPID PANEL
Cholesterol: 128 mg/dL (ref <200)
HDL: 54 mg/dL (ref >=50)
LDL Cholesterol (Calc): 58 mg/dL (calc)
Non-HDL Cholesterol (Calc): 74 mg/dL (calc) (ref <130)
Total CHOL/HDL Ratio: 2.4 (calc) (ref <5.0)
Triglycerides: 75 mg/dL (ref <150)

## 2019-07-22 LAB — TSH: TSH: 1.39 mIU/L (ref 0.40–4.50)

## 2019-07-22 LAB — T4, FREE: Free T4: 1.2 ng/dL (ref 0.8–1.8)

## 2019-07-22 NOTE — Telephone Encounter (Signed)
Spoke to [place] PA and she changed dosage of medication and patient stated she understood the new directions

## 2019-07-22 NOTE — Telephone Encounter (Signed)
Need dosage changes

## 2019-07-24 ENCOUNTER — Other Ambulatory Visit: Payer: Self-pay | Admitting: Family Medicine

## 2019-07-24 DIAGNOSIS — G20 Parkinson's disease: Secondary | ICD-10-CM

## 2019-07-24 DIAGNOSIS — R269 Unspecified abnormalities of gait and mobility: Secondary | ICD-10-CM

## 2019-07-24 DIAGNOSIS — Z9181 History of falling: Secondary | ICD-10-CM

## 2019-07-25 LAB — URINALYSIS W MICROSCOPIC + REFLEX CULTURE
Bacteria, UA: NONE SEEN /HPF
Bilirubin Urine: NEGATIVE
Glucose, UA: NEGATIVE
Hgb urine dipstick: NEGATIVE
Hyaline Cast: NONE SEEN /LPF
Ketones, ur: NEGATIVE
Nitrites, Initial: NEGATIVE
Protein, ur: NEGATIVE
RBC / HPF: NONE SEEN /HPF (ref 0–2)
Specific Gravity, Urine: 1.019 (ref 1.001–1.03)
Squamous Epithelial / HPF: NONE SEEN /HPF (ref <=5)
pH: <=5 (ref 5.0–8.0)

## 2019-07-25 LAB — URINE CULTURE
MICRO NUMBER:: 768086
SPECIMEN QUALITY:: ADEQUATE

## 2019-07-25 LAB — CULTURE INDICATED

## 2019-08-12 ENCOUNTER — Ambulatory Visit: Payer: Medicare Other | Attending: Family Medicine | Admitting: Physical Therapy

## 2019-08-12 ENCOUNTER — Encounter: Payer: Self-pay | Admitting: Physical Therapy

## 2019-08-12 ENCOUNTER — Other Ambulatory Visit: Payer: Self-pay

## 2019-08-12 DIAGNOSIS — M6281 Muscle weakness (generalized): Secondary | ICD-10-CM

## 2019-08-12 DIAGNOSIS — R2681 Unsteadiness on feet: Secondary | ICD-10-CM | POA: Diagnosis present

## 2019-08-12 NOTE — Therapy (Signed)
[facility] MAIN [facility] SERVICES [address] [place], [place], [address] Phone: [phone]   Fax:  [phone]  Physical Therapy Evaluation  Patient Details  Name: Whitney [name] MRN: [id number] Date of Birth: March 25, 1941 Referring Provider (PT): Danelle Berry PA   Encounter Date: 08/12/2019  PT End of Session - 08/12/19 1206    Visit Number  1    Number of Visits  17    Date for PT Re-Evaluation  10/07/19    PT Start Time  1001    PT Stop Time  1100    PT Time Calculation (min)  59 min    Equipment Utilized During Treatment  Gait belt    Activity Tolerance  Patient tolerated treatment well    Behavior During Therapy  [facility] for tasks assessed/performed       Past Medical History:  Diagnosis Date  . Arthritis   . Depression   . Diabetes mellitus without complication (HCC)   . Goiter   . Overactive bladder   . Parkinson's disease (HCC)   . Unsteady gait     Past Surgical History:  Procedure Laterality Date  . ABDOMINAL HYSTERECTOMY    . BLADDER REPAIR    . NECK SURGERY      There were no vitals filed for this visit.   Subjective Assessment - 08/12/19 1001    Subjective  "I just can't walk well."    Pertinent History  78 yo Female reports having difficulty walking with increased shuffling feet especially in crowded environments over last 2 years. She reports approximately 3 falls in last 6 months. She reports when she was leaning over she just kept going and fell forward; PMH significant for neuropathy with numbness in plantar surface of feet to toes (over last year); She has also been diagnosed with Parkinson's Disease although some physicians say that she doesn't have it. She has been taking Sinemet with no change in symptoms (4 months); She is following up with neurologist in 3 months;    Limitations  Standing;Walking    How long can you sit comfortably?  NA    How long can you stand comfortably?  Patient often leans forward on  counter/buggy with prolonged standing due to fatigue; denies any back pain;    How long can you walk comfortably?  about 200-300 feet; has difficulty changing surfaces/narrow environments    Diagnostic tests  MRI in June 2020- no acute abnormality, mild-mod chronic small vessel disease noted;    Patient Stated Goals  be able to walk better; improve speed/distance; reduce fall risk;    Currently in Pain?  No/denies    Multiple Pain Sites  No         [facility] PT Assessment - 08/12/19 0001      Assessment   Medical Diagnosis  Gait abnormality/Parkinson's Disease    Referring Provider (PT)  Danelle Berry PA    Onset Date/Surgical Date  --   2 years   Hand Dominance  Left    Next MD Visit  October 2020    Prior Therapy  denies any PT for this condition;       Precautions   Precautions  Fall      Restrictions   Weight Bearing Restrictions  No      Balance Screen   Has the patient fallen in the past 6 months  Yes    How many times?  3    Has the patient had a decrease in  activity level because of a fear of falling?   Yes    Is the patient reluctant to leave their home because of a fear of falling?   Yes      Home Environment   Additional Comments  Lives in single story home with 4 steps to enter with one rail assist (negotiates one step at a time); lives alone; mod I for self care ADLs; does do light cooking; not cleaning home;       Prior Function   Level of Independence  Independent with basic ADLs    Vocation  Retired    Leisure  nothing      MicrobiologistCognition   Memory  --   age related changes     Observation/Other Assessments   Activities of Balance Confidence Scale (ABC Scale)   21.25%      Sensation   Light Touch  Appears Intact   slight numbness plantar surface of foot   Proprioception  Appears Intact      Coordination   Gross Motor Movements are Fluid and Coordinated  Yes    Fine Motor Movements are Fluid and Coordinated  Yes    Finger Nose Finger Test  accurate  bilaterally      Posture/Postural Control   Posture Comments  sits with mild slumped posture      AROM   Overall AROM Comments  [facility]      Strength   Right Hip Flexion  4/5    Right Hip Extension  3/5    Right Hip ABduction  4/5    Left Hip Flexion  4/5    Left Hip Extension  3/5    Left Hip ABduction  4/5    Right Knee Flexion  4+/5    Right Knee Extension  5/5    Left Knee Flexion  4+/5    Left Knee Extension  5/5    Right Ankle Dorsiflexion  5/5    Left Ankle Dorsiflexion  5/5      Transfers   Comments  able to transfer sit<>Stand from chair without pushing on arm rests; however she does have difficulty from low seated surfaces      Ambulation/Gait   Gait Comments  ambulates with reciprocal gait pattern, decreased heel strike, narrow base of support; exhibits decreased foot clearance and increased shuffled steps with dual tasks or narrow/crowded environment;       Standardized Balance Assessment   Five times sit to stand comments   14.76 sec without HHA (<15 sec indicates low fall risk)    10 Meter Walk  0.83 m/s without AD (limited community ambulator)      Timed Up and Go Test   Normal TUG (seconds)  19.5    Manual TUG (seconds)  17.6    Cognitive TUG (seconds)  23.85    TUG Comments  all without AD, Increased risk for falls      Functional Gait  Assessment   Gait Level Surface  Walks 20 ft in less than 7 sec but greater than 5.5 sec, uses assistive device, slower speed, mild gait deviations, or deviates 6-10 in outside of the 12 in walkway width.    Change in Gait Speed  Able to smoothly change walking speed without loss of balance or gait deviation. Deviate no more than 6 in outside of the 12 in walkway width.    Gait with Horizontal Head Turns  Performs head turns smoothly with slight change in gait velocity (  eg, minor disruption to smooth gait path), deviates 6-10 in outside 12 in walkway width, or uses an assistive device.    Gait with Vertical Head  Turns  Performs task with slight change in gait velocity (eg, minor disruption to smooth gait path), deviates 6 - 10 in outside 12 in walkway width or uses assistive device    Gait and Pivot Turn  Turns slowly, requires verbal cueing, or requires several small steps to catch balance following turn and stop    Step Over Obstacle  Is able to step over one shoe box (4.5 in total height) without changing gait speed. No evidence of imbalance.    Gait with Narrow Base of Support  Ambulates 4-7 steps.    Gait with Eyes Closed  Walks 20 ft, slow speed, abnormal gait pattern, evidence for imbalance, deviates 10-15 in outside 12 in walkway width. Requires more than 9 sec to ambulate 20 ft.    Ambulating Backwards  Cannot walk 20 ft without assistance, severe gait deviations or imbalance, deviates greater than 15 in outside 12 in walkway width or will not attempt task.    Steps  Two feet to a stair, must use rail.    Total Score  15    FGA comment:  high fall risk                Objective measurements completed on examination: See above findings.   TREATMENT: Initiated HEP: Pt tied red band around BLE: Hip abduction x10 bilaterally; Hip extension x10 bilaterally Hip flexion x10 bilaterally; Patient required min-moderate verbal/tactile cues for correct exercise technique including cues to avoid trunk lean and improve weight shift for better hip strengthening; Provided written HEP for better adherence; Patient able to remove band independently;            PT Education - 08/12/19 1205    Education Details  HEP/plan of care    Person(s) Educated  Patient    Methods  Explanation;Verbal cues;Handout    Comprehension  Verbalized understanding;Returned demonstration;Verbal cues required;Need further instruction       PT Short Term Goals - 08/12/19 1211      PT SHORT TERM GOAL #1   Title  Patient will be adherent to HEP at least 3x a week to improve functional strength and balance  for better safety at home.    Time  4    Period  Weeks    Status  New    Target Date  09/09/19      PT SHORT TERM GOAL #2   Title  Patient will increase BLE gross strength to 4+/5 as to improve functional strength for independent gait, increased standing tolerance and increased ADL ability.    Time  4    Period  Weeks    Status  New    Target Date  09/09/19        PT Long Term Goals - 08/12/19 1211      PT LONG TERM GOAL #1   Title  Patient will increase Functional Gait Assessment score to >20/30 as to reduce fall risk and improve dynamic gait safety with community ambulation.    Time  8    Period  Weeks    Status  New    Target Date  10/07/19      PT LONG TERM GOAL #2   Title  Patient will increase 10 meter walk test to >1.45m/s as to improve gait speed for better community ambulation and to reduce  fall risk.    Time  8    Period  Weeks    Status  New    Target Date  10/07/19      PT LONG TERM GOAL #3   Title  Patient will reduce timed up and go to <11 seconds to reduce fall risk and demonstrate improved transfer/gait ability.    Time  8    Period  Weeks    Status  New    Target Date  10/07/19      PT LONG TERM GOAL #4   Title  Patient will ascend/descend 4 stairs with 1 rail assist independently without loss of balance to improve ability to get in/out of home.    Time  8    Period  Weeks    Status  New    Target Date  10/07/19             Plan - 08/12/19 1206    Clinical Impression Statement  78 yo Female reports gait abnormality over last several years. Patient was diagnosed with parkinson's disease by neurologist and was given sinemet for treatment. She denies any improvement with medication. Patient is able to walk without assistive device for short distances with decreased step length, slower gait speed and decreased foot clearance. She exhibits significant change in gait pattern with short shuffled steps when turning, walking in narrow environment, or with  dual task. Patient tested as a high fall risk. She does exhibit slight weakness in BLE. She would benefit from skilled PT intervention to improve balance and gait safety.    Personal Factors and Comorbidities  Age;Comorbidity 3+;Time since onset of injury/illness/exacerbation;Transportation    Comorbidities  Parkinson's, diabetes, depression, arthritis    Examination-Activity Limitations  Locomotion Level;Stairs;Stand;Transfers    Examination-Participation Restrictions  Cleaning;Community Activity;Driving;Shop;Volunteer;Yard Work    Conservation officer, historic buildingstability/Clinical Decision Making  Evolving/Moderate complexity    Clinical Decision Making  Moderate    Rehab Potential  Fair    PT Frequency  2x / week    PT Duration  8 weeks    PT Treatment/Interventions  Moist Heat;Cryotherapy;Gait training;Stair training;Functional mobility training;Therapeutic activities;Therapeutic exercise;Balance training;Neuromuscular re-education;Patient/family education;Passive range of motion;Energy conservation    PT Next Visit Plan  work on balance/HEP    PT Home Exercise Plan  initiated    Consulted and Agree with Plan of Care  Patient       Patient will benefit from skilled therapeutic intervention in order to improve the following deficits and impairments:  Abnormal gait, Decreased balance, Decreased endurance, Decreased mobility, Difficulty walking, Decreased strength, Decreased safety awareness, Decreased activity tolerance  Visit Diagnosis: Muscle weakness (generalized)  Unsteadiness on feet     Problem List Patient Active Problem List   Diagnosis Date Noted  . Vitamin D deficiency 07/20/2019  . 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HCC) 07/20/2019  . At high risk for falls 07/20/2019  . Mass on back 01/21/2019  . Fatigue 01/15/2019  . Parkinson disease (HCC) 01/15/2019  . Hyperlipidemia 01/15/2019  . Diabetes mellitus type 2 in obese (HCC) 01/13/2019  . Goiter 01/13/2019  . Overactive bladder  01/13/2019  . Obesity (BMI 30.0-34.9) 01/13/2019  . Medication monitoring encounter 01/13/2019  . Gait abnormality 01/13/2019    Jemimah Cressy PT, DPT 08/12/2019, 12:13 PM  [facility] [address] [place], [place], [address] Phone: [phone]   Fax:  [phone]  Name: Whitney DavidsonMarjorie [name] MRN: [id number] Date of Birth: 12/09/1941

## 2019-08-12 NOTE — Patient Instructions (Signed)
Balance, Proprioception: Hip Abduction With Tubing   With tubing attached to both ankles, Standing holding onto counter, kick one leg out to side and then Return.  Repeat _10___ times  On each side.  Do ___2_ sessions per day.  http://cc.exer.[email or web address]    Balance, Proprioception: Hip Extension With Tubing   With tubing tied around both legs, holding onto kitchen counter, swing leg back. Return. Repeat _10___ times . Do __2__ sessions per day.  http://cc.exer.[email or web address]   Copyright  VHI. All rights reserved.  Balance, Proprioception: Hip Flexion With Tubing   With tubing attached to both ankles, swing leg forward. Return. Repeat _10___ times. Do __2__ sessions per day.  http://cc.exer.[email or web address]   Copyright  VHI. All rights reserved.  Band Walk: Side Stepping   Tie band around legs, AROUND ANKLES. Step _10__ feet to one side, then step back to start. Repeat _2-3__ feet per session. Note: Small towel between band and skin eases rubbing.  http://plyo.exer.[email or web address]   Copyright  VHI. All rights reserved.    

## 2019-08-19 ENCOUNTER — Other Ambulatory Visit: Payer: Self-pay

## 2019-08-19 DIAGNOSIS — N3941 Urge incontinence: Secondary | ICD-10-CM

## 2019-08-19 DIAGNOSIS — N3281 Overactive bladder: Secondary | ICD-10-CM

## 2019-08-19 MED ORDER — SOLIFENACIN SUCCINATE 5 MG PO TABS: 5.0000 mg | ORAL_TABLET | Freq: Every day | ORAL | 11 refills | Status: DC

## 2019-08-21 ENCOUNTER — Other Ambulatory Visit: Payer: Self-pay

## 2019-08-21 ENCOUNTER — Ambulatory Visit: Payer: Medicare Other

## 2019-08-21 DIAGNOSIS — M6281 Muscle weakness (generalized): Secondary | ICD-10-CM | POA: Diagnosis not present

## 2019-08-21 DIAGNOSIS — R2681 Unsteadiness on feet: Secondary | ICD-10-CM

## 2019-08-21 NOTE — Therapy (Signed)
[facility] [address] [place], [place], [address] Phone: [phone]   Fax:  [phone]  Physical Therapy Treatment  Patient Details  Name: Whitney DavidsonMarjorie [name] MRN: [id number] Date of Birth: 01/09/1941 Referring Provider (PT): Danelle Berryapia, Leisa PA   Encounter Date: 08/21/2019  PT End of Session - 08/21/19 1128    Visit Number  2    Number of Visits  17    Date for PT Re-Evaluation  10/07/19    PT Start Time  1032    PT Stop Time  1114    PT Time Calculation (min)  42 min    Equipment Utilized During Treatment  Gait belt    Activity Tolerance  Patient tolerated treatment well    Behavior During Therapy  [facility] for tasks assessed/performed       Past Medical History:  Diagnosis Date  . Arthritis   . Depression   . Diabetes mellitus without complication (HCC)   . Goiter   . Overactive bladder   . Parkinson's disease (HCC)   . Unsteady gait     Past Surgical History:  Procedure Laterality Date  . ABDOMINAL HYSTERECTOMY    . BLADDER REPAIR    . NECK SURGERY      There were no vitals filed for this visit.  Subjective Assessment - 08/21/19 1128    Subjective  Patient reports compliance with HEP, no falls or LOB since last session. Is going on a vacation in two weeks and wants to be able to walk without losing her balance.    Pertinent History  78 yo Female reports having difficulty walking with increased shuffling feet especially in crowded environments over last 2 years. She reports approximately 3 falls in last 6 months. She reports when she was leaning over she just kept going and fell forward; PMH significant for neuropathy with numbness in plantar surface of feet to toes (over last year); She has also been diagnosed with Parkinson's Disease although some physicians say that she doesn't have it. She has been taking Sinemet with no change in symptoms (4 months); She is following up with neurologist in 3 months;    Limitations  Standing;Walking    How long can you sit comfortably?  NA    How long can you stand comfortably?  Patient often leans forward on counter/buggy with prolonged standing due to fatigue; denies any back pain;    How long can you walk comfortably?  about 200-300 feet; has difficulty changing surfaces/narrow environments    Diagnostic tests  MRI in June 2020- no acute abnormality, mild-mod chronic small vessel disease noted;    Patient Stated Goals  be able to walk better; improve speed/distance; reduce fall risk;    Currently in Pain?  No/denies          Treatment:  Neuro Re-ed: Patient requires CGA during all standing interventions due to limited stability and patient fear of LOB. Cueing for reduction of UE support required as well as postural alignment for optimal stability within COM  In // bars  airex pad: horizontal head turns 30 seconds x 2 trials  airex pad one foot on 6" step : more challenged with RLE back  hedgehog taps with PT cuieng leg and color to tap; challenging for cross body tap however remained upright with occasional UE support. Challenged with differentiating between L and RLE occasionally.   Step over orange hurdle; one leg at a time 10x each LE: reduction of UE support  to no UE support however requires additional encouragement to reduce velocity of movement for improved stability.   Side step over orange hurdle, one leg at a time 12x each LE, reducing UE support to SUE support  Speed ladder: one foot in each box to increase step length and promote visual cueing for foot clearance x 12 length of // bars with increasing step length and foot clearance with reduction of UE support each trial indicating carryover and learning.   rocker step df/pf to amplify initial contact and preswing phases of ambulation x 10 each LE, progress to perform 3 then step perform 3 then step x 4 lengths of // bars. Ambulated 2 length of // bars with no episodes of shuffling  indicating carryover of task  Reaching inside/outside BOS tapping balloon without LOB for coordination and reaction timing x 2 minutes  In hallway: Ambulate with horizontal head turns reading playing cards on wall, excessive shuffling requiring use of CGA and one episode of near LOB 86 ft  Ambulate with focus on heel strike and hip flexion for improved foot clearance, cueing of overexaggeration resulted in improved gait mechanics x 86 ft x 2 trials.                      PT Education - 08/21/19 1128    Education Details  exercise technique, stability, gait mechanics    Person(s) Educated  Patient    Methods  Explanation;Demonstration;Tactile cues;Verbal cues    Comprehension  Verbalized understanding;Returned demonstration;Verbal cues required;Tactile cues required       PT Short Term Goals - 08/12/19 1211      PT SHORT TERM GOAL #1   Title  Patient will be adherent to HEP at least 3x a week to improve functional strength and balance for better safety at home.    Time  4    Period  Weeks    Status  New    Target Date  09/09/19      PT SHORT TERM GOAL #2   Title  Patient will increase BLE gross strength to 4+/5 as to improve functional strength for independent gait, increased standing tolerance and increased ADL ability.    Time  4    Period  Weeks    Status  New    Target Date  09/09/19        PT Long Term Goals - 08/12/19 1211      PT LONG TERM GOAL #1   Title  Patient will increase Functional Gait Assessment score to >20/30 as to reduce fall risk and improve dynamic gait safety with community ambulation.    Time  8    Period  Weeks    Status  New    Target Date  10/07/19      PT LONG TERM GOAL #2   Title  Patient will increase 10 meter walk test to >1.27m/s as to improve gait speed for better community ambulation and to reduce fall risk.    Time  8    Period  Weeks    Status  New    Target Date  10/07/19      PT LONG TERM GOAL #3   Title   Patient will reduce timed up and go to <11 seconds to reduce fall risk and demonstrate improved transfer/gait ability.    Time  8    Period  Weeks    Status  New    Target Date  10/07/19  PT LONG TERM GOAL #4   Title  Patient will ascend/descend 4 stairs with 1 rail assist independently without loss of balance to improve ability to get in/out of home.    Time  8    Period  Weeks    Status  New    Target Date  10/07/19            Plan - 08/21/19 1130    Clinical Impression Statement  Patient presents to physical therapy with good motivation. She demonstrates carryover from interventions in // bars to her gait mechanics with decreased episodes of shuffling within the session.Turning is challenging to patient and results in return to shuffle step pattern. She would benefit from skilled PT intervention to improve balance and gait safety.    Personal Factors and Comorbidities  Age;Comorbidity 3+;Time since onset of injury/illness/exacerbation;Transportation    Comorbidities  Parkinson's, diabetes, depression, arthritis    Examination-Activity Limitations  Locomotion Level;Stairs;Stand;Transfers    Examination-Participation Restrictions  Cleaning;Community Activity;Driving;Shop;Volunteer;Yard Work    Conservation officer, historic buildings  Evolving/Moderate complexity    Rehab Potential  Fair    PT Frequency  2x / week    PT Duration  8 weeks    PT Treatment/Interventions  Moist Heat;Cryotherapy;Gait training;Stair training;Functional mobility training;Therapeutic activities;Therapeutic exercise;Balance training;Neuromuscular re-education;Patient/family education;Passive range of motion;Energy conservation    PT Next Visit Plan  work on balance/HEP    PT Home Exercise Plan  initiated    Consulted and Agree with Plan of Care  Patient       Patient will benefit from skilled therapeutic intervention in order to improve the following deficits and impairments:  Abnormal gait, Decreased  balance, Decreased endurance, Decreased mobility, Difficulty walking, Decreased strength, Decreased safety awareness, Decreased activity tolerance  Visit Diagnosis: Muscle weakness (generalized)  Unsteadiness on feet     Problem List Patient Active Problem List   Diagnosis Date Noted  . Vitamin D deficiency 07/20/2019  . 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HCC) 07/20/2019  . At high risk for falls 07/20/2019  . Mass on back 01/21/2019  . Fatigue 01/15/2019  . Parkinson disease (HCC) 01/15/2019  . Hyperlipidemia 01/15/2019  . Diabetes mellitus type 2 in obese (HCC) 01/13/2019  . Goiter 01/13/2019  . Overactive bladder 01/13/2019  . Obesity (BMI 30.0-34.9) 01/13/2019  . Medication monitoring encounter 01/13/2019  . Gait abnormality 01/13/2019   Precious Bard, PT, DPT   08/21/2019, 11:31 AM  [facility] MAIN [facility] SERVICES [address] [place], [place], [address] Phone: [phone]   Fax:  [phone]  Name: Whitney [name] MRN: [id number] Date of Birth: [birth year]

## 2019-08-26 ENCOUNTER — Encounter: Payer: Self-pay | Admitting: Physical Therapy

## 2019-08-26 ENCOUNTER — Ambulatory Visit: Payer: Medicare Other | Admitting: Physical Therapy

## 2019-08-26 ENCOUNTER — Other Ambulatory Visit: Payer: Self-pay

## 2019-08-26 DIAGNOSIS — M6281 Muscle weakness (generalized): Secondary | ICD-10-CM | POA: Diagnosis not present

## 2019-08-26 DIAGNOSIS — R2681 Unsteadiness on feet: Secondary | ICD-10-CM

## 2019-08-26 NOTE — Patient Instructions (Signed)
Access Code: [email or web address]  URL: [email or web address]  Date: 08/26/2019  Prepared by: Blanche East   Exercises  Backward Walking with Counter Support - 5 reps - 2 sets - 1x daily - 7x weekly  Standing Quarter Turn with Counter Support - 10 reps - 2 sets - 1x daily - 7x weekly

## 2019-08-26 NOTE — Therapy (Signed)
[facility] [address] [place], [place], [address] Phone: [phone]   Fax:  [phone]  Physical Therapy Treatment  Patient Details  Name: Whitney DavidsonMarjorie [name] MRN: [id number] Date of Birth: 01/31/1941 Referring Provider (PT): Danelle Berryapia, Leisa PA   Encounter Date: 08/26/2019  PT End of Session - 08/26/19 1025    Visit Number  3    Number of Visits  17    Date for PT Re-Evaluation  10/07/19    PT Start Time  1018    PT Stop Time  1100    PT Time Calculation (min)  42 min    Equipment Utilized During Treatment  Gait belt    Activity Tolerance  Patient tolerated treatment well    Behavior During Therapy  [facility] for tasks assessed/performed       Past Medical History:  Diagnosis Date  . Arthritis   . Depression   . Diabetes mellitus without complication (HCC)   . Goiter   . Overactive bladder   . Parkinson's disease (HCC)   . Unsteady gait     Past Surgical History:  Procedure Laterality Date  . ABDOMINAL HYSTERECTOMY    . BLADDER REPAIR    . NECK SURGERY      There were no vitals filed for this visit.  Subjective Assessment - 08/26/19 1023    Subjective  Patient reports doing well; She reports walking better and has been able to do her HEP daily; She reports intermittent knee pain (left). Reports that she fell last week while negotiating stairs at entrance of house, denies that she hurt herself/received an injury.    Pertinent History  78 yo Female reports having difficulty walking with increased shuffling feet especially in crowded environments over last 2 years. She reports approximately 3 falls in last 6 months. She reports when she was leaning over she just kept going and fell forward; PMH significant for neuropathy with numbness in plantar surface of feet to toes (over last year); She has also been diagnosed with Parkinson's Disease although some physicians say that she doesn't have it. She has been taking Sinemet  with no change in symptoms (4 months); She is following up with neurologist in 3 months;    Limitations  Standing;Walking    How long can you sit comfortably?  NA    How long can you stand comfortably?  Patient often leans forward on counter/buggy with prolonged standing due to fatigue; denies any back pain;    How long can you walk comfortably?  about 200-300 feet; has difficulty changing surfaces/narrow environments    Diagnostic tests  MRI in June 2020- no acute abnormality, mild-mod chronic small vessel disease noted;    Patient Stated Goals  be able to walk better; improve speed/distance; reduce fall risk;    Currently in Pain?  Yes    Pain Score  2     Pain Location  Knee    Pain Orientation  Left    Pain Descriptors / Indicators  Aching;Sore    Pain Type  Acute pain    Pain Onset  In the past 7 days    Pain Frequency  Intermittent    Aggravating Factors   standing/walking    Pain Relieving Factors  rest    Effect of Pain on Daily Activities  decreased activity tolerance;    Multiple Pain Sites  No         TREATMENT: NMR:  Patient instructed in advanced balance exercise  Standing in parallel bars:  Standing on airex foam: -Feet together unsupported 30 sec hold with minimal sway noted; -feet together head turns side/side x10 reps each direction with min VCs for objects to look at for visual tracking; close supervision required;  -Alternate march with 0 rail assist with min Vcs to increase hip flexion and slow down LE movement for better challenge to SLS x1 min, CGA to close supervision required;   Patient ascend/descend 4 steps with B rail assist x3 sets , forward non-reciprocal, progressing to negotiating 4 steps with step-to gait pattern without rail assist x2 sets; Patient does require CGA especially when descending steps with cues for weight shift and sequencing for better step safety; instructed patient to utilize step-to gait pattern for descending steps rather than  step-through to foster greater support and minimize fall risk;   Instructed patient in multiple directional stepping: 4 square stepping in various directions x2-3 min with 2 feet in each square, CGA required with cues for foot placement and sequencing; Patient able to take a good step length length when stepping to side or forward but does have difficulty with stepping backwards or with diagonal stepping; Progressed to 90 degree turning with 4 square stepping in various directions forward/backward x10 turns progressing distance to bigger 90 degrees turns with less shuffled steps;  Standing in kitchen:  90 degree turning at kitchen counter with cues to increase step length for better turning negotiation; Initially patient exhibits shuffled steps with turning requiring min VCs to increase step length for better turn negotiation; patient able to exhibit improved foot clearance and step length following cues/instruction;  Side stepping along counter x10 feet x2 rep each direction  Forward/backward walking x10 feet x5 rep with cues to increase step length for reciprocal walking especially when going backwards; patient has more difficulty with RLE SLS, causing a step-to gait pattern when walking backwards, corrected with min VC;  Standing on firm surface: SLS on firm surface with 1 rail assist x30 sec each LE with cues for weight shift and erect posture for better stance control;    Gait outside in hallway: Forward walking with head turns  Side/side calling out cards x80 feet x4 laps with mod VCs to increase step length and improve gait speed to challenge dual task; Also required cues for better heel strike at initial contact to improve foot clearance and reduce fall risk;    Exercise: Side stepping x  8  Steps each LE (6 inch step) with cues to increase step length for better foot clearance; Utilizes bilateral hand hold assist on rail for safety, CGA required for safety;  Pt also required min VCs for  sequencing as at one point she exhibited a mis step crossing feet to step up;  Forward step up from 6 inch step with 2 rail assist x15 reps each LE with cues for forward weight shift and to slow down step up for better strengthening and motor control; Patient does fatigue with increased repetition;  Progressed to forward step up without rail assist x6 reps with CGA for safety with good sequencing and positioning;     Response to treatment:  Patient performed well with therapy, noticeable differences in gait pattern and step negotiation by the end of the session. Patient continues to have difficulty with dual-task ambulation, notably head turns, cognitive tasks, and speaking. Shuffle-gait becomes more prominent when environment is "closing in" on patient, such as end of hallway during laps, eases with verbal cuing and demonstration of heel-strike/roll  step. Patient claims she feels more confident with her walking, stair negotiation, and activities that involve turns/pivots. Patient would benefit from continued skilled PT to improve LE strength and endurance, facilitate functional gait pattern, and decrease fall risk with ADLs and IADLs.                  PT Education - 08/26/19 1024    Education Details  exercise technique/balance; HEP    Person(s) Educated  Patient    Methods  Explanation;Verbal cues    Comprehension  Verbalized understanding;Returned demonstration;Verbal cues required;Need further instruction       PT Short Term Goals - 08/12/19 1211      PT SHORT TERM GOAL #1   Title  Patient will be adherent to HEP at least 3x a week to improve functional strength and balance for better safety at home.    Time  4    Period  Weeks    Status  New    Target Date  09/09/19      PT SHORT TERM GOAL #2   Title  Patient will increase BLE gross strength to 4+/5 as to improve functional strength for independent gait, increased standing tolerance and increased ADL ability.    Time   4    Period  Weeks    Status  New    Target Date  09/09/19        PT Long Term Goals - 08/12/19 1211      PT LONG TERM GOAL #1   Title  Patient will increase Functional Gait Assessment score to >20/30 as to reduce fall risk and improve dynamic gait safety with community ambulation.    Time  8    Period  Weeks    Status  New    Target Date  10/07/19      PT LONG TERM GOAL #2   Title  Patient will increase 10 meter walk test to >1.48m/s as to improve gait speed for better community ambulation and to reduce fall risk.    Time  8    Period  Weeks    Status  New    Target Date  10/07/19      PT LONG TERM GOAL #3   Title  Patient will reduce timed up and go to <11 seconds to reduce fall risk and demonstrate improved transfer/gait ability.    Time  8    Period  Weeks    Status  New    Target Date  10/07/19      PT LONG TERM GOAL #4   Title  Patient will ascend/descend 4 stairs with 1 rail assist independently without loss of balance to improve ability to get in/out of home.    Time  8    Period  Weeks    Status  New    Target Date  10/07/19            Plan - 08/26/19 1125    Clinical Impression Statement  Patient presents to physical therapy with good motivation. Initially demonstrated concordant signs/symptoms of loss of balance and shuffling gait that improved with exercises that challenged stepping strategies, stair negotiation, and dual-tasking.  Patient would benefit from continued skilled PT to improve LE strength and endurance, facilitate functional gait pattern, and decrease fall risk with ADLs and IADLs.    Personal Factors and Comorbidities  Age;Comorbidity 3+;Time since onset of injury/illness/exacerbation;Transportation    Comorbidities  Parkinson's, diabetes, depression, arthritis    Examination-Activity Limitations  Locomotion Level;Stairs;Stand;Transfers    Examination-Participation Restrictions  Cleaning;Community Activity;Driving;Shop;Volunteer;Yard Work     Conservation officer, historic buildingstability/Clinical Decision Making  Evolving/Moderate complexity    Rehab Potential  Fair    PT Frequency  2x / week    PT Duration  8 weeks    PT Treatment/Interventions  Moist Heat;Cryotherapy;Gait training;Stair training;Functional mobility training;Therapeutic activities;Therapeutic exercise;Balance training;Neuromuscular re-education;Patient/family education;Passive range of motion;Energy conservation    PT Next Visit Plan  work on balance/HEP    PT Home Exercise Plan  initiated    Consulted and Agree with Plan of Care  Patient       Patient will benefit from skilled therapeutic intervention in order to improve the following deficits and impairments:  Abnormal gait, Decreased balance, Decreased endurance, Decreased mobility, Difficulty walking, Decreased strength, Decreased safety awareness, Decreased activity tolerance  Visit Diagnosis: Muscle weakness (generalized)  Unsteadiness on feet     Problem List Patient Active Problem List   Diagnosis Date Noted  . Vitamin D deficiency 07/20/2019  . 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HCC) 07/20/2019  . At high risk for falls 07/20/2019  . Mass on back 01/21/2019  . Fatigue 01/15/2019  . Parkinson disease (HCC) 01/15/2019  . Hyperlipidemia 01/15/2019  . Diabetes mellitus type 2 in obese (HCC) 01/13/2019  . Goiter 01/13/2019  . Overactive bladder 01/13/2019  . Obesity (BMI 30.0-34.9) 01/13/2019  . Medication monitoring encounter 01/13/2019  . Gait abnormality 01/13/2019    , PT, DPT 08/26/2019, 1:14 PM  [facility] [address] [place], [place], [address] Phone: [phone]   Fax:  [phone]  Name: Whitney DavidsonMarjorie [name] MRN: [id number] Date of Birth: 11/10/1941

## 2019-08-27 ENCOUNTER — Ambulatory Visit: Payer: Self-pay | Admitting: Family Medicine

## 2019-08-27 NOTE — Telephone Encounter (Signed)
I would decrease metformin and have her call us back if nausea is still bad.

## 2019-08-27 NOTE — Telephone Encounter (Signed)
Pt reports her metformin and Wellbutrin dosages where increased 07/20/2019 "And I have been nauseated ever since. " States only nauseated in mornings, until after lunch. Also reports vomiting "About 2 times a week since then."  Reports she went back on her Metformin dose of 500 mg last week. Is continuing the Wellbutrin dose as ordered.  Reports BS has been running 128-130 past 2 weeks; this AM 146 but checked after eating. Denies any other symptoms, states is staying hydrated , denies diarrhea. Pt questioning of nausea is from wellbutrin dose increase. Also questioning why metformin was increased.   Please advise: [phone]  Reason for Disposition . [1] Caller has NON-URGENT medication question about med that PCP prescribed AND [2] triager unable to answer question  Answer Assessment - Initial Assessment Questions 1.   NAME of MEDICATION: "What medicine are you calling about?"     Metformin and Wellbutrin. 2.   QUESTION: "What is your question?"     Causing vomiting with increased dosages 3.   PRESCRIBING HCP: "Who prescribed it?" Reason: if prescribed by specialist, call should be referred to that group.      4. SYMPTOMS: "Do you have any symptoms?"    Vomiting 2 x weekly 5. SEVERITY: If symptoms are present, ask "Are they mild, moderate or severe?"    Nausea mild 6.  PREGNANCY:  "Is there any chance that you are pregnant?" "When was your last menstrual period?"     *No Answer*  Protocols used: MEDICATION QUESTION CALL-A-AH

## 2019-08-28 ENCOUNTER — Ambulatory Visit: Payer: Medicare Other

## 2019-08-28 ENCOUNTER — Telehealth: Payer: Self-pay

## 2019-08-28 ENCOUNTER — Other Ambulatory Visit: Payer: Self-pay

## 2019-08-28 DIAGNOSIS — R2681 Unsteadiness on feet: Secondary | ICD-10-CM

## 2019-08-28 DIAGNOSIS — M6281 Muscle weakness (generalized): Secondary | ICD-10-CM

## 2019-08-28 MED ORDER — BUPROPION HCL ER (XL) 150 MG PO TB24: 150.0000 mg | ORAL_TABLET | Freq: Every day | ORAL | 1 refills | Status: DC

## 2019-08-28 NOTE — Telephone Encounter (Signed)
Pt states she does not think it is coming from the metformin.  She states her Wellbutrin was doubled and she would like to cut it back down?

## 2019-08-28 NOTE — Addendum Note (Signed)
Addended by: Delsa Grana on: 08/28/2019 04:50 PM   Modules accepted: Orders

## 2019-08-28 NOTE — Telephone Encounter (Signed)
Lft vm 

## 2019-08-28 NOTE — Telephone Encounter (Signed)
Copied from [place] [phone]. Topic: General - Other >> Aug 28, 2019  1:13 PM Leward Quan A wrote: Reason for CRM: Patient called to speak to the nurse whom called her on 06/26/2019 states that she did not take the buPROPion (WELLBUTRIN SR) 200 MG 12 hr tablet this morning because she think that is what is making her sick. She states that she is going out of town on Sunday so asking for a call back at Ph#  [phone]

## 2019-08-28 NOTE — Therapy (Signed)
[facility] [place] [address] [place], [place], [address] Phone: [phone]   Fax:  [phone]  Physical Therapy Treatment  Patient Details  Name: Whitney [name] MRN: [id number] Date of Birth: 08/14/1941 Referring Provider (PT): Danelle Berryapia, Leisa PA   Encounter Date: 08/28/2019  PT End of Session - 08/28/19 1043    Visit Number  4    Number of Visits  17    Date for PT Re-Evaluation  10/07/19    PT Start Time  0958    PT Stop Time  1042    PT Time Calculation (min)  44 min    Equipment Utilized During Treatment  Gait belt    Activity Tolerance  Patient tolerated treatment well    Behavior During Therapy  [facility] for tasks assessed/performed       Past Medical History:  Diagnosis Date  . Arthritis   . Depression   . Diabetes mellitus without complication (HCC)   . Goiter   . Overactive bladder   . Parkinson's disease (HCC)   . Unsteady gait     Past Surgical History:  Procedure Laterality Date  . ABDOMINAL HYSTERECTOMY    . BLADDER REPAIR    . NECK SURGERY      There were no vitals filed for this visit.  Subjective Assessment - 08/28/19 1042    Subjective  Patient reports she hasn't had any falls since her last session. Is leaving Sunday for a short trip. Has been doing her HEP.    Pertinent History  78 yo Female reports having difficulty walking with increased shuffling feet especially in crowded environments over last 2 years. She reports approximately 3 falls in last 6 months. She reports when she was leaning over she just kept going and fell forward; PMH significant for neuropathy with numbness in plantar surface of feet to toes (over last year); She has also been diagnosed with Parkinson's Disease although some physicians say that she doesn't have it. She has been taking Sinemet with no change in symptoms (4 months); She is following up with neurologist in 3 months;    Limitations  Standing;Walking    How long can you  sit comfortably?  NA    How long can you stand comfortably?  Patient often leans forward on counter/buggy with prolonged standing due to fatigue; denies any back pain;    How long can you walk comfortably?  about 200-300 feet; has difficulty changing surfaces/narrow environments    Diagnostic tests  MRI in June 2020- no acute abnormality, mild-mod chronic small vessel disease noted;    Patient Stated Goals  be able to walk better; improve speed/distance; reduce fall risk;    Currently in Pain?  Yes    Pain Score  2     Pain Location  Knee    Pain Orientation  Left    Pain Descriptors / Indicators  Aching    Pain Type  Acute pain    Pain Onset  In the past 7 days    Pain Frequency  Intermittent        Treatment:  Neuro Re-ed: Patient requires CGA during all standing interventions due to limited stability and patient fear of LOB. Cueing for reduction of UE support required as well as postural alignment for optimal stability within COM  In // bars  airex pad one foot on 6" step : more challenged with RLE back  Step over orange hurdle; one leg at a time 10x each  LE: reduction of UE support to no UE support however requires additional encouragement to reduce velocity of movement for improved stability.   Side step over orange hurdle, one leg at a time 12x each LE, reducing UE support to SUE support  6" step eccentric step down cueing for feet width for stability; 10x each LE, requires BUE support ; very fatiguing.   Speed ladder: one foot in each box to increase step length and promote visual cueing for foot clearance x 12 length of // bars with increasing step length and foot clearance with reduction of UE support each trial indicating carryover and learning.   rocker step df/pf to amplify initial contact and preswing phases of ambulation x 10 each LE, progress to perform 3 then step perform 3 then step x 4 lengths of // bars. Ambulated 2 length of // bars with no episodes of  shuffling indicating carryover of task  Reaching inside/outside BOS tapping balloon without LOB for coordination and reaction timing x 2 minutes  Single knee to ball for hip flexion; cueing for decreased velocity 10x each LE, 2 sets; BUE support  Single knee to railing with 3 second eccentric/concentreic phase with SUE support 10x each LE, very challenging.   [place]vine in // bars BUE support max verbal and visual cueing (demonstration) required, very challenging with coordination/sequencing 4x length of // bars  In hallway:  Ambulate with focus on heel strike and hip flexion for improved foot clearance, cueing of overexaggeration resulted in improved gait mechanics x 86 ft x 4 trials.     TherEx: cueing for sequence and body mechanics for proper muscle activation  Adduction ball squeezes 10x 3 second holds  2lb ankle weights  - marching seated 12x each LE; cueing for feet position for neutral alignment/muscle recruitment  -standing hip extension/step back 10x each LE, BUE support for upright posture  -hip abduction/side step in standing, 10x each LE, BUE-SUE support   -seated LAQ 10x each LE.       Pt educated throughout session about proper posture and technique with exercises. Improved exercise technique, movement at target joints, use of target muscles after min to mod verbal, visual, tactile cues.                           PT Education - 08/28/19 1043    Education Details  exercise technique, gait stability, body mechanics    Person(s) Educated  Patient    Methods  Explanation;Demonstration;Tactile cues;Verbal cues    Comprehension  Verbalized understanding;Returned demonstration;Verbal cues required;Tactile cues required       PT Short Term Goals - 08/12/19 1211      PT SHORT TERM GOAL #1   Title  Patient will be adherent to HEP at least 3x a week to improve functional strength and balance for better safety at home.    Time  4    Period   Weeks    Status  New    Target Date  09/09/19      PT SHORT TERM GOAL #2   Title  Patient will increase BLE gross strength to 4+/5 as to improve functional strength for independent gait, increased standing tolerance and increased ADL ability.    Time  4    Period  Weeks    Status  New    Target Date  09/09/19        PT Long Term Goals - 08/12/19 1211  PT LONG TERM GOAL #1   Title  Patient will increase Functional Gait Assessment score to >20/30 as to reduce fall risk and improve dynamic gait safety with community ambulation.    Time  8    Period  Weeks    Status  New    Target Date  10/07/19      PT LONG TERM GOAL #2   Title  Patient will increase 10 meter walk test to >1.68m/s as to improve gait speed for better community ambulation and to reduce fall risk.    Time  8    Period  Weeks    Status  New    Target Date  10/07/19      PT LONG TERM GOAL #3   Title  Patient will reduce timed up and go to <11 seconds to reduce fall risk and demonstrate improved transfer/gait ability.    Time  8    Period  Weeks    Status  New    Target Date  10/07/19      PT LONG TERM GOAL #4   Title  Patient will ascend/descend 4 stairs with 1 rail assist independently without loss of balance to improve ability to get in/out of home.    Time  8    Period  Weeks    Status  New    Target Date  10/07/19            Plan - 08/28/19 1044    Clinical Impression Statement  Patient presents with good motivation to decrease her shuffle step during ambulation due to upcoming trip to [facility]. Patient fatigues quickly with standing interventions resulting in regression to shuffle pattern of ambulation when tired. Focus on strengthening and stability performed with patient demonstrating good carryover for short term ambulation prior to fatigue. Patient would benefit from continued skilled PT to improve LE strength and endurance, facilitate functional gait pattern, and decrease fall risk with ADLs  and IADLs.    Personal Factors and Comorbidities  Age;Comorbidity 3+;Time since onset of injury/illness/exacerbation;Transportation    Comorbidities  Parkinson's, diabetes, depression, arthritis    Examination-Activity Limitations  Locomotion Level;Stairs;Stand;Transfers    Examination-Participation Restrictions  Cleaning;Community Activity;Driving;Shop;Volunteer;Yard Work    Conservation officer, historic buildings  Evolving/Moderate complexity    Rehab Potential  Fair    PT Frequency  2x / week    PT Duration  8 weeks    PT Treatment/Interventions  Moist Heat;Cryotherapy;Gait training;Stair training;Functional mobility training;Therapeutic activities;Therapeutic exercise;Balance training;Neuromuscular re-education;Patient/family education;Passive range of motion;Energy conservation    PT Next Visit Plan  work on balance/HEP    PT Home Exercise Plan  initiated    Consulted and Agree with Plan of Care  Patient       Patient will benefit from skilled therapeutic intervention in order to improve the following deficits and impairments:  Abnormal gait, Decreased balance, Decreased endurance, Decreased mobility, Difficulty walking, Decreased strength, Decreased safety awareness, Decreased activity tolerance  Visit Diagnosis: Muscle weakness (generalized)  Unsteadiness on feet     Problem List Patient Active Problem List   Diagnosis Date Noted  . Vitamin D deficiency 07/20/2019  . 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HCC) 07/20/2019  . At high risk for falls 07/20/2019  . Mass on back 01/21/2019  . Fatigue 01/15/2019  . Parkinson disease (HCC) 01/15/2019  . Hyperlipidemia 01/15/2019  . Diabetes mellitus type 2 in obese (HCC) 01/13/2019  . Goiter 01/13/2019  . Overactive bladder 01/13/2019  . Obesity (BMI 30.0-34.9) 01/13/2019  .  Medication monitoring encounter 01/13/2019  . Gait abnormality 01/13/2019   Precious BardMarina Carmel Garfield, PT, DPT   08/28/2019, 10:45 AM  [facility] [address] [place], [place], [address] Phone: [phone]   Fax:  [phone]  Name: Whitney [name] MRN: [id number] Date of Birth: 10/27/1941

## 2019-08-31 ENCOUNTER — Telehealth: Payer: Self-pay

## 2019-08-31 MED ORDER — METFORMIN HCL ER 500 MG PO TB24: 500.0000 mg | ORAL_TABLET | Freq: Two times a day (BID) | ORAL | 2 refills | Status: DC

## 2019-08-31 NOTE — Telephone Encounter (Signed)
Copied from [place] [id number]. Topic: Quick Communication - See Telephone Encounter >> Aug 28, 2019  6:29 PM Blase Mess A wrote: CRM for notification. See Telephone encounter for: 08/28/19.  Patient is calling York Cerise back regarding the wellbutrin. Please advise

## 2019-08-31 NOTE — Telephone Encounter (Signed)
Patient want to know if you could send in the Metformin 500 mg to her pharmacy also since you were backing both of them down.

## 2019-09-01 ENCOUNTER — Ambulatory Visit: Admitting: Physical Therapy

## 2019-09-04 ENCOUNTER — Other Ambulatory Visit: Payer: Self-pay

## 2019-09-04 ENCOUNTER — Ambulatory Visit: Payer: Medicare Other

## 2019-09-04 VITALS — BP 131/67 | HR 84

## 2019-09-04 DIAGNOSIS — M6281 Muscle weakness (generalized): Secondary | ICD-10-CM | POA: Diagnosis not present

## 2019-09-04 DIAGNOSIS — R2681 Unsteadiness on feet: Secondary | ICD-10-CM

## 2019-09-04 NOTE — Therapy (Signed)
[facility] MAIN [facility] SERVICES [address] [place], [place], [address] Phone: [phone]   Fax:  [phone]  Physical Therapy Treatment  Patient Details  Name: Whitney [name] MRN: [id number] Date of Birth: [birth year] Referring Provider (PT): Danelle Berry PA   Encounter Date: 09/04/2019  PT End of Session - 09/04/19 0959    Visit Number  5    Number of Visits  17    Date for PT Re-Evaluation  10/07/19    PT Start Time  1000    PT Stop Time  1045    PT Time Calculation (min)  45 min    Equipment Utilized During Treatment  Gait belt    Activity Tolerance  Patient tolerated treatment well    Behavior During Therapy  [facility] for tasks assessed/performed       Past Medical History:  Diagnosis Date  . Arthritis   . Depression   . Diabetes mellitus without complication (HCC)   . Goiter   . Overactive bladder   . Parkinson's disease (HCC)   . Unsteady gait     Past Surgical History:  Procedure Laterality Date  . ABDOMINAL HYSTERECTOMY    . BLADDER REPAIR    . NECK SURGERY      Vitals:   09/04/19 1004  BP: 131/67  Pulse: 84  SpO2: 97%    Subjective Assessment - 09/04/19 1011    Subjective  Patient reports she had a fall in the bathroom while she was in [facility] last Monday night.  Her R hand is bruised and swollen over her 4th and 5th metacarpals.  Additonally, she hit her head and nose when she fell and is tender to palpation over her nose and on the L side of her forehead.    Pertinent History  78 yo Female reports having difficulty walking with increased shuffling feet especially in crowded environments over last 2 years. She reports approximately 3 falls in last 6 months. She reports when she was leaning over she just kept going and fell forward; PMH significant for neuropathy with numbness in plantar surface of feet to toes (over last year); She has also been diagnosed with Parkinson's Disease although some physicians say that she  doesn't have it. She has been taking Sinemet with no change in symptoms (4 months); She is following up with neurologist in 3 months;    Limitations  Standing;Walking    How long can you sit comfortably?  NA    How long can you stand comfortably?  Patient often leans forward on counter/buggy with prolonged standing due to fatigue; denies any back pain;    How long can you walk comfortably?  about 200-300 feet; has difficulty changing surfaces/narrow environments    Diagnostic tests  MRI in June 2020- no acute abnormality, mild-mod chronic small vessel disease noted;    Patient Stated Goals  be able to walk better; improve speed/distance; reduce fall risk;    Currently in Pain?  Yes    Pain Score  8     Pain Location  Hand    Pain Orientation  Right    Pain Descriptors / Indicators  Sharp    Pain Type  Acute pain    Pain Onset  In the past 7 days    Pain Frequency  Constant    Multiple Pain Sites  --       Treatment:  Neuro Re-ed: Patient requires CGA during all standing interventions due to limited stability and patient  fear of LOB. Cueing for reduction of UE support required as well as postural alignment for optimal stability within COM  NuStep warm up during history; LE only 5 min L1  In // bars  airex pad alternating step ups onto 6" step x10 bilaterally; intermittent use of SUE throughout and a few instances of decreased foot clearance.  CGA throughout  Step over orange hurdle; one leg at a time 2x10x each LE: more difficulty in R SLS; CGA throughout with intermittent SUE  Side step over orange hurdle, one leg at a time 12x each LE, reducing BUE support to SUE support  6" step eccentric step down cueing for feet width for stability; 10x each LE, requires BUE support; more difficulty with descent from RLE  Speed ladder: forward and retro gait with one foot in each boxto increase step length and promote visual cueing for foot clearance x5 length of // bars in each  direction.  In doorway:  x10 passes through doorway threshold with cues to take big steps to assist with freezing episodes.  Demonstrated less freezing as reps progressed.    In hallway:  Ambulate with focus on big steps and big arm swing with walking pole, cueing of overexaggeration resulted in improved gait mechanics 75'x6     Pt educated throughout session about proper posture and technique with exercises. Improved exercise technique, movement at target joints, use of target muscles after min to mod verbal, visual, tactile cues.    Pt demonstrates excellent motivation throughout today's session.  She demonstrates more difficulty with exercises involving R single leg stance, requiring up to minA for steadying for LOBs in // bars.  She mentioned that she has freezing episodes with crossing thresholds, so she was cued to internally tell herself to take a big step as she crossed the threshold to assist with the freezing.  She demonstrated improvement with increased reps, but would benefit from additional cueing and training for freezing episodes.  Additionally, she responded well to the use of the walking poles to assist with reciprocal arm swing and verbal cueing to take big steps with improvement noted in her heel strike, stride length, and gait speed. She denies any changes in her vision since her fall and denies being on a blood thinner. The bridge of her nose and her 4th and 5th MCP joints are slightly painful. Pt encouraged to notify her PCP and/or go to an urgent care for imaging.  Patient would benefit from continued skilled PT to improve LE strength and endurance, facilitate functional gait pattern, and decrease fall risk with ADLs and IADLs.                PT Short Term Goals - 08/12/19 1211      PT SHORT TERM GOAL #1   Title  Patient will be adherent to HEP at least 3x a week to improve functional strength and balance for better safety at home.    Time  4    Period   Weeks    Status  New    Target Date  09/09/19      PT SHORT TERM GOAL #2   Title  Patient will increase BLE gross strength to 4+/5 as to improve functional strength for independent gait, increased standing tolerance and increased ADL ability.    Time  4    Period  Weeks    Status  New    Target Date  09/09/19        PT Long Term  Goals - 08/12/19 1211      PT LONG TERM GOAL #1   Title  Patient will increase Functional Gait Assessment score to >20/30 as to reduce fall risk and improve dynamic gait safety with community ambulation.    Time  8    Period  Weeks    Status  New    Target Date  10/07/19      PT LONG TERM GOAL #2   Title  Patient will increase 10 meter walk test to >1.42m/s as to improve gait speed for better community ambulation and to reduce fall risk.    Time  8    Period  Weeks    Status  New    Target Date  10/07/19      PT LONG TERM GOAL #3   Title  Patient will reduce timed up and go to <11 seconds to reduce fall risk and demonstrate improved transfer/gait ability.    Time  8    Period  Weeks    Status  New    Target Date  10/07/19      PT LONG TERM GOAL #4   Title  Patient will ascend/descend 4 stairs with 1 rail assist independently without loss of balance to improve ability to get in/out of home.    Time  8    Period  Weeks    Status  New    Target Date  10/07/19            Plan - 09/04/19 1109    Clinical Impression Statement  Pt demonstrates excellent motivation throughout today's session.  She demonstrates more difficulty with exercises involving R single leg stance, requiring up to minA for steadying for LOBs in // bars.  She mentioned that she has freezing episodes with crossing thresholds, so she was cued to internally tell herself to take a big step as she crossed the threshold to assist with the freezing.  She demonstrated improvement with increased reps, but would benefit from additional cueing and training for freezing episodes.   Additionally, she responded well to the use of the walking poles to assist with reciprocal arm swing and verbal cueing to take big steps with improvement noted in her heel strike, stride length, and gait speed. She denies any changes in her vision since her fall and denies being on a blood thinner. The bridge of her nose and her 4th and 5th MCP joints are slightly painful. Pt encouraged to notify her PCP and/or go to an urgent care for imaging.  Patient would benefit from continued skilled PT to improve LE strength and endurance, facilitate functional gait pattern, and decrease fall risk with ADLs and IADLs.    Personal Factors and Comorbidities  Age;Comorbidity 3+;Time since onset of injury/illness/exacerbation;Transportation    Comorbidities  Parkinson's, diabetes, depression, arthritis    Examination-Activity Limitations  Locomotion Level;Stairs;Stand;Transfers    Examination-Participation Restrictions  Cleaning;Community Activity;Driving;Shop;Volunteer;Yard Work    Merchant navy officer  Evolving/Moderate complexity    Rehab Potential  Fair    PT Frequency  2x / week    PT Duration  8 weeks    PT Treatment/Interventions  Moist Heat;Cryotherapy;Gait training;Stair training;Functional mobility training;Therapeutic activities;Therapeutic exercise;Balance training;Neuromuscular re-education;Patient/family education;Passive range of motion;Energy conservation    PT Next Visit Plan  work on balance/HEP; freezing and FOG episode cueing    PT Home Exercise Plan  initiated    Consulted and Agree with Plan of Care  Patient       Patient will benefit  from skilled therapeutic intervention in order to improve the following deficits and impairments:  Abnormal gait, Decreased balance, Decreased endurance, Decreased mobility, Difficulty walking, Decreased strength, Decreased safety awareness, Decreased activity tolerance  Visit Diagnosis: Muscle weakness (generalized)  Unsteadiness on  feet     Problem List Patient Active Problem List   Diagnosis Date Noted  . Vitamin D deficiency 07/20/2019  . 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HCC) 07/20/2019  . At high risk for falls 07/20/2019  . Mass on back 01/21/2019  . Fatigue 01/15/2019  . Parkinson disease (HCC) 01/15/2019  . Hyperlipidemia 01/15/2019  . Diabetes mellitus type 2 in obese (HCC) 01/13/2019  . Goiter 01/13/2019  . Overactive bladder 01/13/2019  . Obesity (BMI 30.0-34.9) 01/13/2019  . Medication monitoring encounter 01/13/2019  . Gait abnormality 01/13/2019    This entire session was performed under direct supervision and direction of a licensed therapist/therapist assistant . I have personally read, edited and approve of the note as written.   Ronie Spiesracy Verlinda Slotnick, SPT Lynnea MaizesJason D Huprich PT, DPT, GCS  Huprich,Jason 09/04/2019, 12:26 PM  [facility] [address] [place], [place], [address] Phone: [phone]   Fax:  [phone]  Name: Whitney [name] MRN: [id number] Date of Birth: 07/09/1941

## 2019-09-08 ENCOUNTER — Ambulatory Visit: Admitting: Physical Therapy

## 2019-09-08 LAB — HM DIABETES EYE EXAM

## 2019-09-09 ENCOUNTER — Ambulatory Visit: Payer: Medicare Other | Admitting: Physical Therapy

## 2019-09-09 ENCOUNTER — Other Ambulatory Visit: Payer: Self-pay

## 2019-09-09 ENCOUNTER — Encounter: Payer: Self-pay | Admitting: Physical Therapy

## 2019-09-09 DIAGNOSIS — R2681 Unsteadiness on feet: Secondary | ICD-10-CM

## 2019-09-09 DIAGNOSIS — M6281 Muscle weakness (generalized): Secondary | ICD-10-CM | POA: Diagnosis not present

## 2019-09-09 NOTE — Therapy (Addendum)
[facility] MAIN [facility] [address] [place], [place], [address] Phone: [phone]   Fax:  [phone]  Physical Therapy Treatment  Patient Details  Name: Whitney DavidsonMarjorie [name] MRN: [id number] Date of Birth: 08/03/1941 Referring Provider (PT): Danelle Berryapia, Leisa PA   Encounter Date: 09/09/2019  PT End of Session - 09/09/19 1112    Visit Number  6    Number of Visits  17    Date for PT Re-Evaluation  10/07/19    PT Start Time  1103    PT Stop Time  1145    PT Time Calculation (min)  42 min    Equipment Utilized During Treatment  Gait belt    Activity Tolerance  Patient tolerated treatment well    Behavior During Therapy  [facility]WFL for tasks assessed/performed       Past Medical History:  Diagnosis Date  . Arthritis   . Depression   . Diabetes mellitus without complication (HCC)   . Goiter   . Overactive bladder   . Parkinson's disease (HCC)   . Unsteady gait     Past Surgical History:  Procedure Laterality Date  . ABDOMINAL HYSTERECTOMY    . BLADDER REPAIR    . NECK SURGERY      There were no vitals filed for this visit.  Subjective Assessment - 09/09/19 1106    Subjective  Patient states she is doing well today, reports that her hand and bridge of nose/forehead are doing better s/p fall over vacation.    Pertinent History  78 yo Female reports having difficulty walking with increased shuffling feet especially in crowded environments over last 2 years. She reports approximately 3 falls in last 6 months. She reports when she was leaning over she just kept going and fell forward; PMH significant for neuropathy with numbness in plantar surface of feet to toes (over last year); She has also been diagnosed with Parkinson's Disease although some physicians say that she doesn't have it. She has been taking Sinemet with no change in symptoms (4 months); She is following up with neurologist in 3 months;    Limitations  Standing;Walking    How long  can you sit comfortably?  NA    How long can you stand comfortably?  Patient often leans forward on counter/buggy with prolonged standing due to fatigue; denies any back pain;    How long can you walk comfortably?  about 200-300 feet; has difficulty changing surfaces/narrow environments    Diagnostic tests  MRI in June 2020- no acute abnormality, mild-mod chronic small vessel disease noted;    Patient Stated Goals  be able to walk better; improve speed/distance; reduce fall risk;    Currently in Pain?  No/denies    Pain Score  0-No pain    Pain Onset  In the past 7 days    Multiple Pain Sites  No        Treatment:   Neuro Re-ed: Patient requires CGA during all standing interventions due to limited stability and patient fear of LOB. Cueing for reduction of UE support required as well as postural alignment for optimal stability within COM   NuStep warm up during history; LE only 5 min L1    In // bars   Forward/backward step over orange hurdle 2x10, CGA without UE support; cues to bring knees to chest for improved foot clearance of obstacle; tendency for patient to swing RLE around hurdle instead of over towards end of set, improved with min  VC.  Side/side step over orange hurdle 2x10, CGA and without UE support; cues to take larger step to leave room for contralateral foot placement.   Speed ladder forward/backward walking x5 laps with one foot in each box, no UE support; cues to take large steps to avoid stepping on ladder rungs and improve foot clearance.    Standing on airex foam with one foot on 6" step, ball passes x10 with each foot forward; slight loss of balance when reaching outside center of balance;  Standing on airex foam with one foot on 6" step, head turns side/side x10 with each foot forward; more frequent loss of balance; cues to slow down head turns.   In doorway of OP waiting room:   x10 passes through doorway threshold with cues to take big steps to assist with  freezing episodes. Patient claims that change in carpet pattern makes it difficult to pass through doorway. Demonstrated less freezing as reps progressed.     In hallway:   Ambulate x160 feet with focus on big steps and big arm swing while looking forward; shuffle gait becomes more prominent when turning, improves with repetition.   Exercise: (5 min) Standing with red tband around BLE: Hip abduction 2x10 Hip extension 2x10 Patient required min-moderate verbal/tactile cues for correct exercise technique including to improve postural control and increase ROM to tolerance; Reinforced HEP with instruction to tighten theraband for better resistance.      Pt educated throughout session about proper posture and technique with exercises. Improved exercise technique, movement at target joints, use of target muscles after min to mod verbal, visual, tactile cues.      Pt demonstrates excellent motivation throughout today's session. Patient was able to perform activities in parallel bars with minimal rail assist, required min VC to improve foot clearance of obstacles. She continues to be challenged by freezing episodes with crossing thresholds and turning, which improved with repetition and cues to take large steps.           PT Education - 09/09/19 1111    Education Details  exercise technique, gait stability, body mechanics    Person(s) Educated  Patient    Methods  Explanation;Demonstration;Tactile cues;Verbal cues    Comprehension  Verbalized understanding;Returned demonstration;Verbal cues required;Tactile cues required       PT Short Term Goals - 08/12/19 1211      PT SHORT TERM GOAL #1   Title  Patient will be adherent to HEP at least 3x a week to improve functional strength and balance for better safety at home.    Time  4    Period  Weeks    Status  New    Target Date  09/09/19      PT SHORT TERM GOAL #2   Title  Patient will increase BLE gross strength to 4+/5 as to improve  functional strength for independent gait, increased standing tolerance and increased ADL ability.    Time  4    Period  Weeks    Status  New    Target Date  09/09/19        PT Long Term Goals - 08/12/19 1211      PT LONG TERM GOAL #1   Title  Patient will increase Functional Gait Assessment score to >20/30 as to reduce fall risk and improve dynamic gait safety with community ambulation.    Time  8    Period  Weeks    Status  New    Target Date  10/07/19      PT LONG TERM GOAL #2   Title  Patient will increase 10 meter walk test to >1.12m/s as to improve gait speed for better community ambulation and to reduce fall risk.    Time  8    Period  Weeks    Status  New    Target Date  10/07/19      PT LONG TERM GOAL #3   Title  Patient will reduce timed up and go to <11 seconds to reduce fall risk and demonstrate improved transfer/gait ability.    Time  8    Period  Weeks    Status  New    Target Date  10/07/19      PT LONG TERM GOAL #4   Title  Patient will ascend/descend 4 stairs with 1 rail assist independently without loss of balance to improve ability to get in/out of home.    Time  8    Period  Weeks    Status  New    Target Date  10/07/19            Plan - 09/09/19 1427    Clinical Impression Statement  Patient demonstrates excellent motivation during today's session. She required less utilization of rail assist during balance interventions in the parallel bars compared to previous sessions. Continues to be challenged by freezing when entering thresholds and turning/pivoting, which improved with cues and repetition. Patient would benefit from continued skilled PT to improve the deficits outlined in this note.    Personal Factors and Comorbidities  Age;Comorbidity 3+;Time since onset of injury/illness/exacerbation;Transportation    Comorbidities  Parkinson's, diabetes, depression, arthritis    Examination-Activity Limitations  Locomotion Level;Stairs;Stand;Transfers     Examination-Participation Restrictions  Cleaning;Community Activity;Driving;Shop;Volunteer;Yard Work    Conservation officer, historic buildings  Evolving/Moderate complexity    Rehab Potential  Fair    PT Frequency  2x / week    PT Duration  8 weeks    PT Treatment/Interventions  Moist Heat;Cryotherapy;Gait training;Stair training;Functional mobility training;Therapeutic activities;Therapeutic exercise;Balance training;Neuromuscular re-education;Patient/family education;Passive range of motion;Energy conservation    PT Next Visit Plan  work on balance/HEP; freezing and FOG episode cueing    PT Home Exercise Plan  initiated    Consulted and Agree with Plan of Care  Patient       Patient will benefit from skilled therapeutic intervention in order to improve the following deficits and impairments:  Abnormal gait, Decreased balance, Decreased endurance, Decreased mobility, Difficulty walking, Decreased strength, Decreased safety awareness, Decreased activity tolerance  Visit Diagnosis: Muscle weakness (generalized)  Unsteadiness on feet     Problem List Patient Active Problem List   Diagnosis Date Noted  . Vitamin D deficiency 07/20/2019  . 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HCC) 07/20/2019  . At high risk for falls 07/20/2019  . Mass on back 01/21/2019  . Fatigue 01/15/2019  . Parkinson disease (HCC) 01/15/2019  . Hyperlipidemia 01/15/2019  . Diabetes mellitus type 2 in obese (HCC) 01/13/2019  . Goiter 01/13/2019  . Overactive bladder 01/13/2019  . Obesity (BMI 30.0-34.9) 01/13/2019  . Medication monitoring encounter 01/13/2019  . Gait abnormality 01/13/2019   Fayrene Fearing A. Earlene Plater, SPT This entire session was performed under direct supervision and direction of a licensed therapist/therapist assistant . I have personally read, edited and approve of the note as written.  Trotter,Margaret PT, DPT 09/09/2019, 4:02 PM  [facility] MAIN  [facility] SERVICES [address] [place], [place], [address] Phone:  [phone]   Fax:  [phone]  Name: Valeria Boza MRN: [id number] Date of Birth: 04/13/1941

## 2019-09-10 ENCOUNTER — Encounter: Payer: Self-pay | Admitting: Physical Therapy

## 2019-09-10 ENCOUNTER — Other Ambulatory Visit: Payer: Self-pay

## 2019-09-10 ENCOUNTER — Ambulatory Visit: Payer: Medicare Other | Attending: Family Medicine | Admitting: Physical Therapy

## 2019-09-10 DIAGNOSIS — M6281 Muscle weakness (generalized): Secondary | ICD-10-CM | POA: Diagnosis present

## 2019-09-10 DIAGNOSIS — R2681 Unsteadiness on feet: Secondary | ICD-10-CM | POA: Insufficient documentation

## 2019-09-10 NOTE — Therapy (Signed)
[facility] [address] [place], [place], [address] Phone: [phone]   Fax:  [phone]  Physical Therapy Treatment  Patient Details  Name: Whitney DavidsonMarjorie [name] MRN: [id number] Date of Birth: 02/12/1941 Referring Provider (PT): Danelle Berryapia, Leisa PA   Encounter Date: 09/10/2019  PT End of Session - 09/10/19 1023    Visit Number  7    Number of Visits  17    Date for PT Re-Evaluation  10/07/19    PT Start Time  1018    PT Stop Time  1100    PT Time Calculation (min)  42 min    Equipment Utilized During Treatment  Gait belt    Activity Tolerance  Patient tolerated treatment well    Behavior During Therapy  [facility] for tasks assessed/performed       Past Medical History:  Diagnosis Date  . Arthritis   . Depression   . Diabetes mellitus without complication (HCC)   . Goiter   . Overactive bladder   . Parkinson's disease (HCC)   . Unsteady gait     Past Surgical History:  Procedure Laterality Date  . ABDOMINAL HYSTERECTOMY    . BLADDER REPAIR    . NECK SURGERY      There were no vitals filed for this visit.  Subjective Assessment - 09/10/19 1021    Subjective  Patient states that she is doing well today, no significant changes since last visit. Claims her walking feels slower this morning.    Pertinent History  78 yo Female reports having difficulty walking with increased shuffling feet especially in crowded environments over last 2 years. She reports approximately 3 falls in last 6 months. She reports when she was leaning over she just kept going and fell forward; PMH significant for neuropathy with numbness in plantar surface of feet to toes (over last year); She has also been diagnosed with Parkinson's Disease although some physicians say that she doesn't have it. She has been taking Sinemet with no change in symptoms (4 months); She is following up with neurologist in 3 months;    Limitations  Standing;Walking    How long  can you sit comfortably?  NA    How long can you stand comfortably?  Patient often leans forward on counter/buggy with prolonged standing due to fatigue; denies any back pain;    How long can you walk comfortably?  about 200-300 feet; has difficulty changing surfaces/narrow environments    Diagnostic tests  MRI in June 2020- no acute abnormality, mild-mod chronic small vessel disease noted;    Patient Stated Goals  be able to walk better; improve speed/distance; reduce fall risk;    Currently in Pain?  No/denies    Pain Score  0-No pain    Pain Onset  In the past 7 days    Multiple Pain Sites  No       Neuro Re-ed: Patient requires CGA during all standing interventions due to limited stability and patient fear of LOB. Cueing for reduction of UE support required as well as postural alignment for optimal stability within COM    NuStep warm up during history; LE only 5 min L1   In // bars   Standing on airex foam step ups to 6" step x15, CGA no UE support  Standing on airex foam with one foot on 6" step, trunk rotations x10 with each foot forward; slight loss of balance when reaching outside center of balance;  Outside //  bars  Forward/backward step over orange hurdle 2x10, CGA without UE support; cues to bring knees to chest for improved foot clearance of obstacle; tendency for patient to swing RLE around hurdle instead of over towards end of set, improved with min VC.  Side/side step over orange hurdle 2x10, CGA and without UE support; cues to take larger step to leave room for contralateral foot placement.   Speed ladder forward/backward walking x5 laps with one foot in each box, CGA no UE support; cues to take large steps to avoid stepping on ladder rungs and improve foot clearance.    Speed ladder forward/backward walking through doorway x5 laps with one foot in each box, CGA no UE support; cues to pivot foot in direction of turn to limit shuffling of gait and improve turning speed.    In doorway of OP waiting room:   x10 passes through doorway threshold with cues to take big steps to assist with freezing episodes. Patient claims that change in carpet pattern makes it difficult to pass through doorway. Demonstrated less freezing as reps progressed.     In hallway:  Ambulate x10 feet toward end of corridor x10 laps to practice turning in tight space; cues to take deliberate pivot steps to limit shuffle gait and to listen for her heels scraping the floor to indicate taking bigger steps.  Pt educated throughout session about proper posture and technique with exercises. Improved exercise technique, movement at target joints, use of target muscles after min to mod verbal, visual, tactile cues.   Patient demonstrates excellent motivation throughout today's session. Patient was able to perform standing balance exercises with min VC, required LUE rail assist for step-ups this date. She continues to be challenged by taking large steps and turns without visual cues, which improved with repetition and min VC.      PT Education - 09/10/19 1023    Education Details  exercise technique, gait stability, body mechanics    Person(s) Educated  Patient    Methods  Explanation;Tactile cues;Demonstration;Verbal cues    Comprehension  Verbalized understanding;Returned demonstration;Verbal cues required;Tactile cues required       PT Short Term Goals - 08/12/19 1211      PT SHORT TERM GOAL #1   Title  Patient will be adherent to HEP at least 3x a week to improve functional strength and balance for better safety at home.    Time  4    Period  Weeks    Status  New    Target Date  09/09/19      PT SHORT TERM GOAL #2   Title  Patient will increase BLE gross strength to 4+/5 as to improve functional strength for independent gait, increased standing tolerance and increased ADL ability.    Time  4    Period  Weeks    Status  New    Target Date  09/09/19        PT Long Term Goals -  08/12/19 1211      PT LONG TERM GOAL #1   Title  Patient will increase Functional Gait Assessment score to >20/30 as to reduce fall risk and improve dynamic gait safety with community ambulation.    Time  8    Period  Weeks    Status  New    Target Date  10/07/19      PT LONG TERM GOAL #2   Title  Patient will increase 10 meter walk test to >1.53m/s as to improve gait speed  for better community ambulation and to reduce fall risk.    Time  8    Period  Weeks    Status  New    Target Date  10/07/19      PT LONG TERM GOAL #3   Title  Patient will reduce timed up and go to <11 seconds to reduce fall risk and demonstrate improved transfer/gait ability.    Time  8    Period  Weeks    Status  New    Target Date  10/07/19      PT LONG TERM GOAL #4   Title  Patient will ascend/descend 4 stairs with 1 rail assist independently without loss of balance to improve ability to get in/out of home.    Time  8    Period  Weeks    Status  New    Target Date  10/07/19            Plan - 09/10/19 1058    Clinical Impression Statement  Patient demonstrates excellent motivation during today's session. She exhibited significant improvement with turning in closed spaces, requiring min VC for foot positioning. She also demonstrated improvements with entering thresholds with large steps with min to no VC after several repetitions. Patient would continue to benefit from skilled PT to address the deficits outlined in this note.    Personal Factors and Comorbidities  Age;Comorbidity 3+;Time since onset of injury/illness/exacerbation;Transportation    Comorbidities  Parkinson's, diabetes, depression, arthritis    Examination-Activity Limitations  Locomotion Level;Stairs;Stand;Transfers    Examination-Participation Restrictions  Cleaning;Community Activity;Driving;Shop;Volunteer;Yard Work    Merchant [employer] officer  Evolving/Moderate complexity    Rehab Potential  Fair    PT Frequency  2x /  week    PT Duration  8 weeks    PT Treatment/Interventions  Moist Heat;Cryotherapy;Gait training;Stair training;Functional mobility training;Therapeutic activities;Therapeutic exercise;Balance training;Neuromuscular re-education;Patient/family education;Passive range of motion;Energy conservation    PT Next Visit Plan  work on balance/HEP; freezing and FOG episode cueing    PT Home Exercise Plan  initiated    Consulted and Agree with Plan of Care  Patient       Patient will benefit from skilled therapeutic intervention in order to improve the following deficits and impairments:  Abnormal gait, Decreased balance, Decreased endurance, Decreased mobility, Difficulty walking, Decreased strength, Decreased safety awareness, Decreased activity tolerance  Visit Diagnosis: Muscle weakness (generalized)  Unsteadiness on feet     Problem List Patient Active Problem List   Diagnosis Date Noted  . Vitamin D deficiency 07/20/2019  . 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[place]) 07/20/2019  . At high risk for falls 07/20/2019  . Mass on back 01/21/2019  . Fatigue 01/15/2019  . Parkinson disease ([place]) 01/15/2019  . Hyperlipidemia 01/15/2019  . Diabetes mellitus type 2 in obese ([place]) 01/13/2019  . Goiter 01/13/2019  . Overactive bladder 01/13/2019  . Obesity (BMI 30.0-34.9) 01/13/2019  . Medication monitoring encounter 01/13/2019  . Gait abnormality 01/13/2019   Jeneen Rinks A. Rosana Hoes, SPT This entire session was performed under direct supervision and direction of a licensed therapist/therapist assistant . I have personally read, edited and approve of the note as written.  Trotter,Margaret PT, DPT 09/10/2019, 4:01 PM  [place] MAIN [facility] SERVICES [address] [place], [place], [address] Phone: [phone]   Fax:  [phone]  Name: Whitney [name] MRN: [id number] Date of Birth: 12/18/1940

## 2019-09-15 ENCOUNTER — Other Ambulatory Visit: Payer: Self-pay

## 2019-09-15 ENCOUNTER — Ambulatory Visit: Payer: Medicare Other | Admitting: Physical Therapy

## 2019-09-15 ENCOUNTER — Encounter: Payer: Self-pay | Admitting: Physical Therapy

## 2019-09-15 DIAGNOSIS — M6281 Muscle weakness (generalized): Secondary | ICD-10-CM | POA: Diagnosis not present

## 2019-09-15 DIAGNOSIS — R2681 Unsteadiness on feet: Secondary | ICD-10-CM

## 2019-09-15 NOTE — Therapy (Signed)
[place] MAIN [facility] SERVICES [address] [place], [place], [address] Phone: [phone]   Fax:  [phone]  Physical Therapy Treatment  Patient Details  Name: Whitney [name] MRN: [id number] Date of Birth: 1941-03-26 Referring Provider (PT): Delsa Grana PA   Encounter Date: 09/15/2019  PT End of Session - 09/15/19 1054    Visit Number  8    Number of Visits  17    Date for PT Re-Evaluation  10/07/19    PT Start Time  1100    PT Stop Time  1145    PT Time Calculation (min)  45 min    Equipment Utilized During Treatment  Gait belt    Activity Tolerance  Patient tolerated treatment well    Behavior During Therapy  [facility] for tasks assessed/performed       Past Medical History:  Diagnosis Date  . Arthritis   . Depression   . Diabetes mellitus without complication ([place])   . Goiter   . Overactive bladder   . Parkinson's disease ([place])   . Unsteady gait     Past Surgical History:  Procedure Laterality Date  . ABDOMINAL HYSTERECTOMY    . BLADDER REPAIR    . NECK SURGERY      There were no vitals filed for this visit.  Subjective Assessment - 09/15/19 1056    Subjective  Patient states that she is doing well today, no reports of pain this date. Claims that she is walking better today compared to previous session.    Pertinent History  78 yo Female reports having difficulty walking with increased shuffling feet especially in crowded environments over last 2 years. She reports approximately 3 falls in last 6 months. She reports when she was leaning over she just kept going and fell forward; PMH significant for neuropathy with numbness in plantar surface of feet to toes (over last year); She has also been diagnosed with Parkinson's Disease although some physicians say that she doesn't have it. She has been taking Sinemet with no change in symptoms (4 months); She is following up with neurologist in 3 months;    Limitations  Standing;Walking     How long can you sit comfortably?  NA    How long can you stand comfortably?  Patient often leans forward on counter/buggy with prolonged standing due to fatigue; denies any back pain;    How long can you walk comfortably?  about 200-300 feet; has difficulty changing surfaces/narrow environments    Diagnostic tests  MRI in June 2020- no acute abnormality, mild-mod chronic small vessel disease noted;    Patient Stated Goals  be able to walk better; improve speed/distance; reduce fall risk;    Currently in Pain?  No/denies    Pain Score  0-No pain    Pain Onset  In the past 7 days       Neuro Re-ed: Patient requires CGA during all standing interventions due to limited stability and patient fear of LOB. Cueing for reduction of UE support required as well as postural alignment for optimal stability within COM   -Octane warm up during history; 5 min L1   In // bars   -Standing on airex foam step ups to airex foam on 6" step x10; cues to raise foot higher for improved clearance   -Standing on airex foam with one foot on 6" step, diagonal trunk rotations with yellow weighted ball, each foot forward; slight loss of balance when reaching outside center of  balance; cues to slow down movement.   Outside // bars   -Forward/backward step over orange hurdle 2x10; cues to bring knees to chest for improved foot clearance of obstacle; tendency for patient to swing RLE around hurdle instead of over towards end of set, improved with min VC.   -Side/side step over orange hurdle 2x10; cues to take larger step to leave room for contralateral foot placement.    -Speed ladder forward walking through doorway x5 laps with one foot in each box, CGA no UE support; cues to pivot foot in direction of turn to limit shuffling of gait and improve turning speed.   In doorway between therapy gym and cardiac rehab gym:  -x10 passes through doorway threshold with cues to take big steps to assist with freezing episodes.  Patient claims that change in carpet pattern and unlit room with equipment "clutter" makes it difficult to pass through doorway. Demonstrated less freezing as reps progressed, required cues to remind of step length.    In hallway:   -Ambulate 4x80 feet with intermittent cues to stop and spin 180 degrees; cues to make deliberate pivot steps to limit shuffle gait during turn, and cues to increase step length for increased gait speed; demonstrated shuffling gait when turning back around to original direction, improved with repetition and min VC.   -Ambulate 4x80 feet with intermittent cues to turn head and read color of playing card on wall to challenge shuffling gait with multitasking; In first lap, patient got 6/10 cards correct with moderate decrease in step length, improved to 9/10 correct in second lap with improved step length requiring mod VC.    -Ambulate 4x80 feet with intermittent cues to turn head and read playing card details on wall to challenge shuffling gait with multitasking; In first lap, patient got 3/10 cards correct, with shuffling gait pattern returning with each head turn, improved to 6/10 correct in second lap; required mod VC to increase step length.   Pt educated throughout session about proper posture and technique with exercises. Improved exercise technique, movement at target joints, use of target muscles after min to mod verbal, visual, tactile cues.    Patient demonstrates excellent motivation throughout today's session. Patient was able to perform static and dynamic balance activities with good retention of previous session's progress, required min VC for sequencing. Patient continues to be challenged by entering thresholds and multitasking, which improved with min to mod VC to maintain step length throughout ambulation.      PT Education - 09/15/19 1053    Education Details  exercise technique, gait stability, body mechanics    Person(s) Educated  Patient    Methods   Explanation;Tactile cues;Verbal cues    Comprehension  Verbalized understanding;Returned demonstration;Verbal cues required;Tactile cues required       PT Short Term Goals - 08/12/19 1211      PT SHORT TERM GOAL #1   Title  Patient will be adherent to HEP at least 3x a week to improve functional strength and balance for better safety at home.    Time  4    Period  Weeks    Status  New    Target Date  09/09/19      PT SHORT TERM GOAL #2   Title  Patient will increase BLE gross strength to 4+/5 as to improve functional strength for independent gait, increased standing tolerance and increased ADL ability.    Time  4    Period  Weeks    Status  New    Target Date  09/09/19        PT Long Term Goals - 08/12/19 1211      PT LONG TERM GOAL #1   Title  Patient will increase Functional Gait Assessment score to >20/30 as to reduce fall risk and improve dynamic gait safety with community ambulation.    Time  8    Period  Weeks    Status  New    Target Date  10/07/19      PT LONG TERM GOAL #2   Title  Patient will increase 10 meter walk test to >1.75m/s as to improve gait speed for better community ambulation and to reduce fall risk.    Time  8    Period  Weeks    Status  New    Target Date  10/07/19      PT LONG TERM GOAL #3   Title  Patient will reduce timed up and go to <11 seconds to reduce fall risk and demonstrate improved transfer/gait ability.    Time  8    Period  Weeks    Status  New    Target Date  10/07/19      PT LONG TERM GOAL #4   Title  Patient will ascend/descend 4 stairs with 1 rail assist independently without loss of balance to improve ability to get in/out of home.    Time  8    Period  Weeks    Status  New    Target Date  10/07/19            Plan - 09/15/19 1242    Clinical Impression Statement  patient demonstrated excellent motivation during today's session. She continues to demonstrate improvements with pivot turns and entering thresholds with  significantly less shuffling of gait. She is challenged most by multitasking while walking, as evidenced by shuffling of gait and inaccurate reading of playing cards when walking down hallway, requiring min to mod verbal cues to maintain larger step length. She would continue to benefit from skilled PT to address the deficits outlined by this note.    Personal Factors and Comorbidities  Age;Comorbidity 3+;Time since onset of injury/illness/exacerbation;Transportation    Comorbidities  Parkinson's, diabetes, depression, arthritis    Examination-Activity Limitations  Locomotion Level;Stairs;Stand;Transfers    Examination-Participation Restrictions  Cleaning;Community Activity;Driving;Shop;Volunteer;Yard Work    Conservation officer, historic buildings  Evolving/Moderate complexity    Rehab Potential  Fair    PT Frequency  2x / week    PT Duration  8 weeks    PT Treatment/Interventions  Moist Heat;Cryotherapy;Gait training;Stair training;Functional mobility training;Therapeutic activities;Therapeutic exercise;Balance training;Neuromuscular re-education;Patient/family education;Passive range of motion;Energy conservation    PT Next Visit Plan  work on balance/HEP; freezing and FOG episode cueing    PT Home Exercise Plan  initiated    Consulted and Agree with Plan of Care  Patient       Patient will benefit from skilled therapeutic intervention in order to improve the following deficits and impairments:  Abnormal gait, Decreased balance, Decreased endurance, Decreased mobility, Difficulty walking, Decreased strength, Decreased safety awareness, Decreased activity tolerance  Visit Diagnosis: Muscle weakness (generalized)  Unsteadiness on feet     Problem List Patient Active Problem List   Diagnosis Date Noted  . Vitamin D deficiency 07/20/2019  . 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HCC) 07/20/2019  . At high risk for falls 07/20/2019  . Mass on back 01/21/2019  . Fatigue  01/15/2019  . Parkinson disease (HCC)  01/15/2019  . Hyperlipidemia 01/15/2019  . Diabetes mellitus type 2 in obese (HCC) 01/13/2019  . Goiter 01/13/2019  . Overactive bladder 01/13/2019  . Obesity (BMI 30.0-34.9) 01/13/2019  . Medication monitoring encounter 01/13/2019  . Gait abnormality 01/13/2019   Fayrene Fearing A. Earlene Plater, SPT This entire session was performed under direct supervision and direction of a licensed therapist/therapist assistant . I have personally read, edited and approve of the note as written.  Trotter,Margaret PT, DPT 09/15/2019, 2:58 PM  [place] [facility] MAIN [facility] SERVICES [address] [place], [place], [address] Phone: [phone]   Fax:  [phone]  Name: Bertrice Leder MRN: [id number] Date of Birth: December 18, 1940

## 2019-09-17 ENCOUNTER — Ambulatory Visit: Payer: Medicare Other | Admitting: Physical Therapy

## 2019-09-22 ENCOUNTER — Encounter: Payer: Self-pay | Admitting: Physical Therapy

## 2019-09-22 ENCOUNTER — Other Ambulatory Visit: Payer: Self-pay

## 2019-09-22 ENCOUNTER — Telehealth: Payer: Self-pay | Admitting: Family Medicine

## 2019-09-22 ENCOUNTER — Ambulatory Visit: Payer: Medicare Other | Admitting: Physical Therapy

## 2019-09-22 DIAGNOSIS — R2681 Unsteadiness on feet: Secondary | ICD-10-CM

## 2019-09-22 DIAGNOSIS — M6281 Muscle weakness (generalized): Secondary | ICD-10-CM

## 2019-09-22 NOTE — Telephone Encounter (Signed)
Pt has 2 doses and strengths of Metformin and wants to know which one she should be taking/ please advise

## 2019-09-22 NOTE — Telephone Encounter (Signed)
Pt notified of last one prescribed in chart

## 2019-09-22 NOTE — Therapy (Signed)
[facility] MAIN [facility] SERVICES [address] [place], [place], [address] Phone: [phone]   Fax:  [phone]  Physical Therapy Treatment  Patient Details  Name: Classie Weng MRN: [id number] Date of Birth: 1941-02-11 Referring Provider (PT): Danelle Berry PA   Encounter Date: 09/22/2019  PT End of Session - 09/22/19 1213    Visit Number  9    Number of Visits  17    Date for PT Re-Evaluation  10/07/19    PT Start Time  1105    PT Stop Time  1150    PT Time Calculation (min)  45 min    Equipment Utilized During Treatment  Gait belt    Activity Tolerance  Patient tolerated treatment well    Behavior During Therapy  [facility] for tasks assessed/performed       Past Medical History:  Diagnosis Date  . Arthritis   . Depression   . Diabetes mellitus without complication (HCC)   . Goiter   . Overactive bladder   . Parkinson's disease (HCC)   . Unsteady gait     Past Surgical History:  Procedure Laterality Date  . ABDOMINAL HYSTERECTOMY    . BLADDER REPAIR    . NECK SURGERY      There were no vitals filed for this visit.  Subjective Assessment - 09/22/19 1211    Subjective  Patient states that she is doing well today. Reports that she has been having continued difficulty with turning and entering threshholds like doorways since last visit. No new falls.    Pertinent History  78 yo Female reports having difficulty walking with increased shuffling feet especially in crowded environments over last 2 years. She reports approximately 3 falls in last 6 months. She reports when she was leaning over she just kept going and fell forward; PMH significant for neuropathy with numbness in plantar surface of feet to toes (over last year); She has also been diagnosed with Parkinson's Disease although some physicians say that she doesn't have it. She has been taking Sinemet with no change in symptoms (4 months); She is following up with neurologist in 3  months;    Limitations  Standing;Walking    How long can you sit comfortably?  NA    How long can you stand comfortably?  Patient often leans forward on counter/buggy with prolonged standing due to fatigue; denies any back pain;    How long can you walk comfortably?  about 200-300 feet; has difficulty changing surfaces/narrow environments    Diagnostic tests  MRI in June 2020- no acute abnormality, mild-mod chronic small vessel disease noted;    Patient Stated Goals  be able to walk better; improve speed/distance; reduce fall risk;    Currently in Pain?  No/denies    Pain Score  0-No pain    Pain Onset  In the past 7 days    Multiple Pain Sites  No       .Neuro Re-ed: Patient requires CGA during all standing interventions due to limited stability and patient fear of LOB. Cueing for reduction of UE support required as well as postural alignment for optimal stability within COM     In // bars   -Standing on airex foam step ups to airex foam on 6" step x20; cues to raise foot higher for improved clearance   -Standing on airex foam with one foot on 6" step, self-ball toss x20, each foot forward;   -Standing tandem stance on airex foam x10  head turns, each LE forward; Patient exhibits postural sway to direction of head turns, resulting in LOB and required rail assist for stability; improved with cues to slow down head turns and pause head turns until she feels stable.   -Forward step up and over 6" step x20 to facilitate improved curb negotiation;   -Side-step up and over 6" step x20;   In doorway between therapy gym and cardiac rehab gym:  -Speed ladder forward walking through doorway x5 laps with one foot in each box, CGA no UE support; cues to pivot foot in direction of turn to limit shuffling of gait and improve turning speed.     -x10 passes through doorway threshold with cues to take big steps to assist with freezing episodes. Patient claims that change in carpet pattern and unlit  room with equipment "clutter" makes it difficult to pass through doorway. Demonstrated less freezing as reps progressed, required cues to remind of step length.    In hallway:   Measure gait speed with normal gait x80 feet vs. walking x80 feet while multitasking:   -30.19 sec normal gait vs. 35.64 sec reading playing cards; frequent  errors in reading playing cards, however patient tends to pause and turn  body to read card.   -29.50 sec normal gait vs 39.49 sec counting down from 100 by 2's;  frequent shuffle gait consistent with numerical errors resulting in slower  gait speed.  -Ambulate 4x80 feet with intermittent cues to turn head and read playing card details on wall to challenge shuffling gait with multitasking; In first lap, patient got 2/6cards correct, with shuffling gait pattern returning with each head turn, improved to 3/6 correct in second lap; required mod VC to increase step length.   -Ambulate 4x80 feet with self-ball toss using rainbow ball to challenge motor multitasking and maintaining consistent gait speed; exhibited gait speed not significantly different to normal gait;  -Ambulate 4x80 feet with cognitive tasks: naming things that are blue, names that start with 'A', counting down from 100; patient exhibits slower gait speed and more frequent shuffle steps when a mistake is made or unable to think of a name, although patient was beginning to fatigue which could be a factor in gait speed deviation.   Pt educated throughout session about proper posture and technique with exercises. Improved exercise technique, movement at target joints, use of target muscles after min to mod verbal, visual, tactile cues.    Patient demonstrates excellent motivation throughout today's session. Patient was able to perform advanced balance exercises with good stability, required frequent rail assist when standing on compliant surface and turning head due to postural sway. Patient continues to be  challenged by shuffling gait and turns while walking, especially when performing cognitive task, which improved with min VC to increase step length and remind her to take fewer steps when turning. She also demonstrated consistent gait speed while performing motor task, which was not significantly different to her normal gait speed.    PT Education - 09/22/19 1212    Education Details  exercise technique, gait stability, body mechanics    Person(s) Educated  Patient    Methods  Explanation;Tactile cues;Verbal cues;Demonstration    Comprehension  Verbalized understanding;Returned demonstration;Verbal cues required;Need further instruction       PT Short Term Goals - 08/12/19 1211      PT SHORT TERM GOAL #1   Title  Patient will be adherent to HEP at least 3x a week to improve functional strength and  balance for better safety at home.    Time  4    Period  Weeks    Status  New    Target Date  09/09/19      PT SHORT TERM GOAL #2   Title  Patient will increase BLE gross strength to 4+/5 as to improve functional strength for independent gait, increased standing tolerance and increased ADL ability.    Time  4    Period  Weeks    Status  New    Target Date  09/09/19        PT Long Term Goals - 08/12/19 1211      PT LONG TERM GOAL #1   Title  Patient will increase Functional Gait Assessment score to >20/30 as to reduce fall risk and improve dynamic gait safety with community ambulation.    Time  8    Period  Weeks    Status  New    Target Date  10/07/19      PT LONG TERM GOAL #2   Title  Patient will increase 10 meter walk test to >1.4451m/s as to improve gait speed for better community ambulation and to reduce fall risk.    Time  8    Period  Weeks    Status  New    Target Date  10/07/19      PT LONG TERM GOAL #3   Title  Patient will reduce timed up and go to <11 seconds to reduce fall risk and demonstrate improved transfer/gait ability.    Time  8    Period  Weeks    Status   New    Target Date  10/07/19      PT LONG TERM GOAL #4   Title  Patient will ascend/descend 4 stairs with 1 rail assist independently without loss of balance to improve ability to get in/out of home.    Time  8    Period  Weeks    Status  New    Target Date  10/07/19            Plan - 09/22/19 1228    Clinical Impression Statement  patient exhibited excellent motivation during today's session. She continues to be challenged by maintaining consistent walking speed when performing cognitive tasks as well as difficulty with making smooth turns when walking, which improved with cues to make bigger steps and use fewer steps to turn. Generally, patient gait speed and step length have improved compared to previous visits, though she walks slower with fatigue and requires occasional cues for step length. Patient also dmeonstrates postural sway with head turns, which results in LOB when standing on compliant surface and required frequent rail assist for stability. Patient would continue to benefit from skilled PT to address the deficits outlined in this note.    Personal Factors and Comorbidities  Age;Comorbidity 3+;Time since onset of injury/illness/exacerbation;Transportation    Comorbidities  Parkinson's, diabetes, depression, arthritis    Examination-Activity Limitations  Locomotion Level;Stairs;Stand;Transfers    Examination-Participation Restrictions  Cleaning;Community Activity;Driving;Shop;Volunteer;Yard Work    Conservation officer, historic buildingstability/Clinical Decision Making  Evolving/Moderate complexity    Rehab Potential  Fair    PT Frequency  2x / week    PT Duration  8 weeks    PT Treatment/Interventions  Moist Heat;Cryotherapy;Gait training;Stair training;Functional mobility training;Therapeutic activities;Therapeutic exercise;Balance training;Neuromuscular re-education;Patient/family education;Passive range of motion;Energy conservation    PT Next Visit Plan  work on balance/HEP; freezing and FOG episode cueing     PT Home Exercise  Plan  initiated    Consulted and Agree with Plan of Care  Patient       Patient will benefit from skilled therapeutic intervention in order to improve the following deficits and impairments:  Abnormal gait, Decreased balance, Decreased endurance, Decreased mobility, Difficulty walking, Decreased strength, Decreased safety awareness, Decreased activity tolerance  Visit Diagnosis: Muscle weakness (generalized)  Unsteadiness on feet     Problem List Patient Active Problem List   Diagnosis Date Noted  . Vitamin D deficiency 07/20/2019  . 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HCC) 07/20/2019  . At high risk for falls 07/20/2019  . Mass on back 01/21/2019  . Fatigue 01/15/2019  . Parkinson disease (HCC) 01/15/2019  . Hyperlipidemia 01/15/2019  . Diabetes mellitus type 2 in obese (HCC) 01/13/2019  . Goiter 01/13/2019  . Overactive bladder 01/13/2019  . Obesity (BMI 30.0-34.9) 01/13/2019  . Medication monitoring encounter 01/13/2019  . Gait abnormality 01/13/2019   Fayrene Fearing A. Earlene Plater, SPT This entire session was performed under direct supervision and direction of a licensed therapist/therapist assistant . I have personally read, edited and approve of the note as written.  Trotter,Margaret PT, DPT 09/22/2019, 3:35 PM  [place] [facility] MAIN [facility] SERVICES [address] [place], [place], [address] Phone: [phone]   Fax:  [phone]  Name: Querida Beretta MRN: [id number] Date of Birth: 1941/11/25

## 2019-09-24 ENCOUNTER — Other Ambulatory Visit: Payer: Self-pay

## 2019-09-24 ENCOUNTER — Encounter: Payer: Self-pay | Admitting: Physical Therapy

## 2019-09-24 ENCOUNTER — Ambulatory Visit: Payer: Medicare Other | Admitting: Physical Therapy

## 2019-09-24 DIAGNOSIS — M6281 Muscle weakness (generalized): Secondary | ICD-10-CM

## 2019-09-24 DIAGNOSIS — R2681 Unsteadiness on feet: Secondary | ICD-10-CM

## 2019-09-24 NOTE — Therapy (Signed)
[place] MAIN [facility] SERVICES [address] [place], [place], [address] Phone: [phone]   Fax:  [phone]  Physical Therapy Progress Note   Dates of reporting period  08/12/2019   to   09/24/2019   Patient Details  Name: Whitney [name] MRN: [id number] Date of Birth: June 25, 1941 Referring Provider (PT): Delsa Grana PA   Encounter Date: 09/24/2019  PT End of Session - 09/24/19 1058    Visit Number  10    Number of Visits  17    Date for PT Re-Evaluation  10/07/19    PT Start Time  1100    PT Stop Time  1145    PT Time Calculation (min)  45 min    Equipment Utilized During Treatment  Gait belt    Activity Tolerance  Patient tolerated treatment well    Behavior During Therapy  WFL for tasks assessed/performed       Past Medical History:  Diagnosis Date  . Arthritis   . Depression   . Diabetes mellitus without complication ([place])   . Goiter   . Overactive bladder   . Parkinson's disease ([place])   . Unsteady gait     Past Surgical History:  Procedure Laterality Date  . ABDOMINAL HYSTERECTOMY    . BLADDER REPAIR    . NECK SURGERY      There were no vitals filed for this visit.  Subjective Assessment - 09/24/19 1528    Subjective  Pt reports doing well; Denies any discomfort/pain; Denies any new falls;    Pertinent History  78 yo Female reports having difficulty walking with increased shuffling feet especially in crowded environments over last 2 years. She reports approximately 3 falls in last 6 months. She reports when she was leaning over she just kept going and fell forward; PMH significant for neuropathy with numbness in plantar surface of feet to toes (over last year); She has also been diagnosed with Parkinson's Disease although some physicians say that she doesn't have it. She has been taking Sinemet with no change in symptoms (4 months); She is following up with neurologist in 3 months;    Limitations  Standing;Walking     How long can you sit comfortably?  NA    How long can you stand comfortably?  Patient often leans forward on counter/buggy with prolonged standing due to fatigue; denies any back pain;    How long can you walk comfortably?  about 200-300 feet; has difficulty changing surfaces/narrow environments    Diagnostic tests  MRI in June 2020- no acute abnormality, mild-mod chronic small vessel disease noted;    Patient Stated Goals  be able to walk better; improve speed/distance; reduce fall risk;    Currently in Pain?  No/denies    Pain Onset  In the past 7 days     TREATMENT:  Patient instructed in the following outcome measures and goal assessments:  -HEP compliance: Patient is HEP compliant at least 3x/week.   -FGA: 20/30; significant improvement compared to previous assessment on 08/12/19 of 15/30; continues to be a high falls risk  -44mT: normal gait speed: 11.10 sec = 0.91 m/s; fastest gait speed: 8.96 sec = 1.1 m/s; Slight improvement compared to previous assessment on 08/12/19 of 0.83 m/s; indicates she is still a limited community ambulator.   -TUG:  10.32 sec, which is a significant improvement compared to previous assessment on 08/12/19 of 19.5 sec; indicates less risk of falls.    -Stair negotiation 4 steps  with 1 rail assist: uses 2 rails way down; goal is not achieved this date.   -BLE gross strength: STRENGTH:  Graded on a 0-5 scale Muscle Group Left Right                          Hip Flex 4 4+  Hip Abd 4- 4  Hip Add 3 3+  Hip Ext 4+ 5  Hip IR/ER 5 5  Knee Flex 5 4+  Knee Ext 5 5  Ankle DF 5 5  Ankle PF 5 5     NMRE:  -Speed ladder x6 laps with cognitive task: counting up by 5's from 0; backwards from 100 was too difficult for patient to perform.   -Speed ladder x6 laps with cognitive + motor task: balance ball on cone and identify things in categories (names of fruits, vegetables, articles of clothing); patient exhibited decreased step length and slower gait speed  when performing cognitive tasks, cues to not step on ladder rungs.   Neuro Re-ed: Patient requires CGA during all standing interventions due to limited stability and patient fear of LOB. Cueing for reduction of UE support required as well as postural alignment for optimal stability within COM    Pt educated throughout session about proper posture and technique with exercises. Improved exercise technique, movement at target joints, use of target muscles after min to mod verbal, visual, tactile cues.  Patient's condition has the potential to improve in response to therapy. Maximum improvement is yet to be obtained. The anticipated improvement is attainable and reasonable in a generally predictable time. Patient reports feeling more confident in her ability to walk and turn without shuffling her steps. Her outcome measures and goal assessments indicate improvements in her strength and functional mobility with non-significant change in her gait speed. She continues to have challenges with stair negotiation, specifically with descending stairs without UE assist, as well as functional gait activities like walking with eyes closed, walking backwards, and stepping over obstacles. Patient would continue to benefit from skilled PT to address deficits in patient's balance, walking speed, and strength to improve confidence and independence with ADLs/IADLs.     PT Education - 09/24/19 1057    Education Details  exercise technique, gait stability, body mechanics    Person(s) Educated  Patient    Methods  Explanation;Tactile cues;Demonstration    Comprehension  Verbalized understanding;Returned demonstration;Verbal cues required;Need further instruction       PT Short Term Goals - 09/24/19 1502      PT SHORT TERM GOAL #1   Title  Patient will be adherent to HEP at least 3x a week to improve functional strength and balance for better safety at home.    Time  4    Period  Weeks    Status  Achieved    Target  Date  09/09/19      PT SHORT TERM GOAL #2   Title  Patient will increase BLE gross strength to 4+/5 as to improve functional strength for independent gait, increased standing tolerance and increased ADL ability.    Time  4    Period  Weeks    Status  Partially Met    Target Date  10/07/19        PT Long Term Goals - 09/24/19 1503      PT LONG TERM GOAL #1   Title  Patient will increase Functional Gait Assessment score to >20/30 as to reduce fall risk and  improve dynamic gait safety with community ambulation.    Baseline  09/24/2019: 20/30, continues to be a falls risk;    Time  8    Period  Weeks    Status  Partially Met    Target Date  10/07/19      PT LONG TERM GOAL #2   Title  Patient will increase 10 meter walk test to >1.18ms as to improve gait speed for better community ambulation and to reduce fall risk.    Baseline  09/24/2019: patient selected gait speed 0.91 m/s, fastest 1.1 m/s;    Time  8    Period  Weeks    Status  Partially Met    Target Date  10/07/19      PT LONG TERM GOAL #3   Title  Patient will reduce timed up and go to <11 seconds to reduce fall risk and demonstrate improved transfer/gait ability.    Baseline  09/24/2019: 10.32 sec    Time  8    Period  Weeks    Status  Achieved      PT LONG TERM GOAL #4   Title  Patient will ascend/descend 4 stairs with 1 rail assist independently without loss of balance to improve ability to get in/out of home.    Baseline  09/24/2019: ascends with 0 UE support, descends with 2 UE support;    Time  8    Period  Weeks    Status  Partially Met    Target Date  10/07/19            Plan - 09/24/19 1506    Clinical Impression Statement  Patient reports feeling more confident in her ability to walk and turn without shuffling her steps. Her outcome measures and goal assessments indicate improvements in her strength and functional mobility with non-significant change in her gait speed. She continues to have challenges  with stair negotiation, specifically with descending stairs without UE assist, as well as functional gait activities like walking with eyes closed, walking backwards, and stepping over obstacles. Patient would continue to benefit from skilled PT to address deficits in patient's balance, walking speed, and strength to improve confidence and independence with ADLs/IADLs.    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/Moderate complexity    Rehab Potential  Fair    PT Frequency  2x / week    PT Duration  8 weeks    PT Treatment/Interventions  Moist Heat;Cryotherapy;Gait training;Stair training;Functional mobility training;Therapeutic activities;Therapeutic exercise;Balance training;Neuromuscular re-education;Patient/family education;Passive range of motion;Energy conservation    PT Next Visit Plan  work on balance/HEP; freezing and FOG episode cueing    PT Home Exercise Plan  initiated    Consulted and Agree with Plan of Care  Patient       Patient will benefit from skilled therapeutic intervention in order to improve the following deficits and impairments:  Abnormal gait, Decreased balance, Decreased endurance, Decreased mobility, Difficulty walking, Decreased strength, Decreased safety awareness, Decreased activity tolerance  Visit Diagnosis: Muscle weakness (generalized)  Unsteadiness on feet     Problem List Patient Active Problem List   Diagnosis Date Noted  . Vitamin D deficiency 07/20/2019  . 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[place] 07/20/2019  . At high risk for falls 07/20/2019  .  Mass on back 01/21/2019   . Fatigue 01/15/2019  . Parkinson disease ([place]) 01/15/2019  . Hyperlipidemia 01/15/2019  . Diabetes mellitus type 2 in obese ([place]) 01/13/2019  . Goiter 01/13/2019  . Overactive bladder 01/13/2019  . Obesity (BMI 30.0-34.9) 01/13/2019  . Medication monitoring encounter 01/13/2019  . Gait abnormality 01/13/2019   Jeneen Rinks A. Rosana Hoes, SPT This entire session was performed under direct supervision and direction of a licensed therapist/therapist assistant . I have personally read, edited and approve of the note as written.  Trotter,Margaret PT, DPT 09/24/2019, 3:29 PM  [place] MAIN [facility] SERVICES [address] [place], [place], [address] Phone: [phone]   Fax:  [phone]  Name: Carnelia Oscar MRN: [id number] Date of Birth: Dec 16, 1940

## 2019-09-28 ENCOUNTER — Other Ambulatory Visit: Payer: Self-pay

## 2019-09-28 ENCOUNTER — Encounter: Payer: Self-pay | Admitting: Physical Therapy

## 2019-09-28 ENCOUNTER — Ambulatory Visit: Payer: Medicare Other | Admitting: Physical Therapy

## 2019-09-28 DIAGNOSIS — M6281 Muscle weakness (generalized): Secondary | ICD-10-CM | POA: Diagnosis not present

## 2019-09-28 DIAGNOSIS — R2681 Unsteadiness on feet: Secondary | ICD-10-CM

## 2019-09-28 NOTE — Therapy (Signed)
[place] MAIN [facility] SERVICES [address] [place], [place], [address] Phone: [phone]   Fax:  [phone]  Physical Therapy Treatment  Patient Details  Name: Whitney [name] MRN: [id number] Date of Birth: 1941/05/20 Referring Provider (PT): Delsa Grana PA   Encounter Date: 09/28/2019  PT End of Session - 09/28/19 1104    Visit Number  11    Number of Visits  17    Date for PT Re-Evaluation  10/07/19    PT Start Time  1100    PT Stop Time  1140    PT Time Calculation (min)  40 min    Equipment Utilized During Treatment  Gait belt    Activity Tolerance  Patient tolerated treatment well    Behavior During Therapy  [facility] for tasks assessed/performed       Past Medical History:  Diagnosis Date  . Arthritis   . Depression   . Diabetes mellitus without complication ([facility])   . Goiter   . Overactive bladder   . Parkinson's disease ([place])   . Unsteady gait     Past Surgical History:  Procedure Laterality Date  . ABDOMINAL HYSTERECTOMY    . BLADDER REPAIR    . NECK SURGERY      There were no vitals filed for this visit.  Subjective Assessment - 09/28/19 1103    Subjective  Pt reports doing well; Denies any discomfort/pain; Denies any new falls;    Pertinent History  78 yo Female reports having difficulty walking with increased shuffling feet especially in crowded environments over last 2 years. She reports approximately 3 falls in last 6 months. She reports when she was leaning over she just kept going and fell forward; PMH significant for neuropathy with numbness in plantar surface of feet to toes (over last year); She has also been diagnosed with Parkinson's Disease although some physicians say that she doesn't have it. She has been taking Sinemet with no change in symptoms (4 months); She is following up with neurologist in 3 months;    Limitations  Standing;Walking    How long can you sit comfortably?  NA    How long can you stand  comfortably?  Patient often leans forward on counter/buggy with prolonged standing due to fatigue; denies any back pain;    How long can you walk comfortably?  about 200-300 feet; has difficulty changing surfaces/narrow environments    Diagnostic tests  MRI in June 2020- no acute abnormality, mild-mod chronic small vessel disease noted;    Patient Stated Goals  be able to walk better; improve speed/distance; reduce fall risk;    Currently in Pain?  No/denies    Pain Score  0-No pain    Pain Onset  In the past 7 days       Treatment: Nu-step x 5 mins  Side stepping in parallel bars x 10 x 3 Side stepping on blue balance foam in parallel bars x 10 x 3 High marching x 20 BLE with cues to get leg higher Standing on foam and tapping cones x 20 BLE Standing on foam and step ups to 6 inch stool x 20  Standing on 6 inch stool and stepping down to purple foam and then backwards step up towards the 6 inch stool x 20  Standing on foam and tapping cones x 10 BLE Ladder stepping fwd and side stepping in parallel bars x 3 laps  Patient improves with practice and her motor control is limited by  decreased dynamic standing balance deficit.  Patient needs CGA with exercises and cues for correct posture.                       PT Education - 09/28/19 1103    Education Details  HEP    Person(s) Educated  Patient    Methods  Explanation;Demonstration    Comprehension  Verbalized understanding;Returned demonstration;Need further instruction       PT Short Term Goals - 09/24/19 1502      PT SHORT TERM GOAL #1   Title  Patient will be adherent to HEP at least 3x a week to improve functional strength and balance for better safety at home.    Time  4    Period  Weeks    Status  Achieved    Target Date  09/09/19      PT SHORT TERM GOAL #2   Title  Patient will increase BLE gross strength to 4+/5 as to improve functional strength for independent gait, increased standing tolerance  and increased ADL ability.    Time  4    Period  Weeks    Status  Partially Met    Target Date  10/07/19        PT Long Term Goals - 09/24/19 1503      PT LONG TERM GOAL #1   Title  Patient will increase Functional Gait Assessment score to >20/30 as to reduce fall risk and improve dynamic gait safety with community ambulation.    Baseline  09/24/2019: 20/30, continues to be a falls risk;    Time  8    Period  Weeks    Status  Partially Met    Target Date  10/07/19      PT LONG TERM GOAL #2   Title  Patient will increase 10 meter walk test to >1.57ms as to improve gait speed for better community ambulation and to reduce fall risk.    Baseline  09/24/2019: patient selected gait speed 0.91 m/s, fastest 1.1 m/s;    Time  8    Period  Weeks    Status  Partially Met    Target Date  10/07/19      PT LONG TERM GOAL #3   Title  Patient will reduce timed up and go to <11 seconds to reduce fall risk and demonstrate improved transfer/gait ability.    Baseline  09/24/2019: 10.32 sec    Time  8    Period  Weeks    Status  Achieved      PT LONG TERM GOAL #4   Title  Patient will ascend/descend 4 stairs with 1 rail assist independently without loss of balance to improve ability to get in/out of home.    Baseline  09/24/2019: ascends with 0 UE support, descends with 2 UE support;    Time  8    Period  Weeks    Status  Partially Met    Target Date  10/07/19              Patient will benefit from skilled therapeutic intervention in order to improve the following deficits and impairments:     Visit Diagnosis: Unsteadiness on feet  Muscle weakness (generalized)     Problem List Patient Active Problem List   Diagnosis Date Noted  . Vitamin D deficiency 07/20/2019  . 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[place] 07/20/2019  . At high risk for falls 07/20/2019  . Mass  on back 01/21/2019  . Fatigue 01/15/2019  . Parkinson disease ([place]) 01/15/2019  .  Hyperlipidemia 01/15/2019  . Diabetes mellitus type 2 in obese ([place]) 01/13/2019  . Goiter 01/13/2019  . Overactive bladder 01/13/2019  . Obesity (BMI 30.0-34.9) 01/13/2019  . Medication monitoring encounter 01/13/2019  . Gait abnormality 01/13/2019    Alanson Puls, PT DPT 09/28/2019, 12:11 PM  [place] MAIN [facility] SERVICES [address] [place], [place], [address] Phone: [phone]   Fax:  [phone]  Name: Marium Ragan MRN: [id number] Date of Birth: 19-Jul-1941

## 2019-09-30 ENCOUNTER — Other Ambulatory Visit: Payer: Self-pay

## 2019-09-30 ENCOUNTER — Ambulatory Visit: Payer: Medicare Other

## 2019-09-30 DIAGNOSIS — R2681 Unsteadiness on feet: Secondary | ICD-10-CM

## 2019-09-30 DIAGNOSIS — M6281 Muscle weakness (generalized): Secondary | ICD-10-CM | POA: Diagnosis not present

## 2019-09-30 NOTE — Therapy (Signed)
[place] MAIN [facility] SERVICES [address] [place], [place], [address] Phone: [phone]   Fax:  [phone]  Physical Therapy Treatment  Patient Details  Name: Whitney [name] MRN: [id number] Date of Birth: Jan 26, 1941 Referring Provider (PT): Delsa Grana PA   Encounter Date: 09/30/2019  PT End of Session - 09/30/19 1122    Visit Number  12    Number of Visits  17    Date for PT Re-Evaluation  10/07/19    PT Start Time  1117    PT Stop Time  1159    PT Time Calculation (min)  42 min    Equipment Utilized During Treatment  Gait belt    Activity Tolerance  Patient tolerated treatment well    Behavior During Therapy  [facility] for tasks assessed/performed       Past Medical History:  Diagnosis Date  . Arthritis   . Depression   . Diabetes mellitus without complication ([place])   . Goiter   . Overactive bladder   . Parkinson's disease ([place])   . Unsteady gait     Past Surgical History:  Procedure Laterality Date  . ABDOMINAL HYSTERECTOMY    . BLADDER REPAIR    . NECK SURGERY      There were no vitals filed for this visit.  Subjective Assessment - 09/30/19 1121    Subjective  Patient reports elevators are challenging for her to perform. Patient reports compliance with HEP, no falls or LOB since last session.    Pertinent History  78 yo Female reports having difficulty walking with increased shuffling feet especially in crowded environments over last 2 years. She reports approximately 3 falls in last 6 months. She reports when she was leaning over she just kept going and fell forward; PMH significant for neuropathy with numbness in plantar surface of feet to toes (over last year); She has also been diagnosed with Parkinson's Disease although some physicians say that she doesn't have it. She has been taking Sinemet with no change in symptoms (4 months); She is following up with neurologist in 3 months;    Limitations  Standing;Walking    How  long can you sit comfortably?  NA    How long can you stand comfortably?  Patient often leans forward on counter/buggy with prolonged standing due to fatigue; denies any back pain;    How long can you walk comfortably?  about 200-300 feet; has difficulty changing surfaces/narrow environments    Diagnostic tests  MRI in June 2020- no acute abnormality, mild-mod chronic small vessel disease noted;    Patient Stated Goals  be able to walk better; improve speed/distance; reduce fall risk;    Currently in Pain?  No/denies          Treatment: Nu-step Lvl 3 4 minutes, RPM> 60 for cardiovascular challenge.   Airex pad : one foot on airex pad one foot on 7" step, 30 seconds each LE, 2 x each LE 7" step toe taps with BUE support.   Sit to stands 10x with arms crossed  Standing with # 2.5 ankle weight: CGA for stability  -Hip extension with bilateral upper extremity support, cueing for neutral hip alignment, upright posture for optimal muscle recruitment, and sequencing, 10x each LE,  -Hip abduction with bilateral upper extremity support, cueing for neutral foot alignment for correct muscle activation, 10x each LE -Hip flexion with bilateral upper extremity support, cueing for body mechanics, speed of muscle recruitment for optimal strengthening and stabilization  10x each LE   Seated with # 2.5  ankle weights  -Seated marches with upright posture, back away from back of chair for abdominal/trunk activation/stabilization, 10x each LE -Seated LAQ with 3 second holds, 10x each LE, cueing for muscle activation and sequencing for neutral alignment -Seated IR/ER with cueing for stabilizing knee placement with lateral foot movement for optimal muscle recruitment, 10x each LE  Seated opposite arm/leg raises 10x each LE  Walking in hallway: CGA with mod cueing for sequencing and task orientation. Mod A for return to COM when LOB.  Walking with dual task: naming family members and grocery list,  initially gait returns to compensatory mechanics. Red light green light 86 ft High knee marches 88 ft.   Patient improves with practice and her motor control is limited by decreased dynamic standing balance deficit.  Patient needs CGA with exercises and cues for correct posture.   Patient benefits from multimodal cueing for body mechanics and sequencing. Patient is progressing with stability on unstable surfaces however is limited by dynamic movement requiring single limb support at this time. Cross body and dual focus tasks are very challenging to patient at this time. Patient would continue to benefit from skilled PT to address deficits in patient's balance, walking speed, and strength to improve confidence and independence with ADLs/IADLs                   PT Education - 09/30/19 1122    Education Details  exercise technique, body mechanics    Person(s) Educated  Patient    Methods  Explanation;Demonstration;Tactile cues;Verbal cues    Comprehension  Verbalized understanding;Returned demonstration;Verbal cues required;Tactile cues required       PT Short Term Goals - 09/24/19 1502      PT SHORT TERM GOAL #1   Title  Patient will be adherent to HEP at least 3x a week to improve functional strength and balance for better safety at home.    Time  4    Period  Weeks    Status  Achieved    Target Date  09/09/19      PT SHORT TERM GOAL #2   Title  Patient will increase BLE gross strength to 4+/5 as to improve functional strength for independent gait, increased standing tolerance and increased ADL ability.    Time  4    Period  Weeks    Status  Partially Met    Target Date  10/07/19        PT Long Term Goals - 09/24/19 1503      PT LONG TERM GOAL #1   Title  Patient will increase Functional Gait Assessment score to >20/30 as to reduce fall risk and improve dynamic gait safety with community ambulation.    Baseline  09/24/2019: 20/30, continues to be a falls risk;     Time  8    Period  Weeks    Status  Partially Met    Target Date  10/07/19      PT LONG TERM GOAL #2   Title  Patient will increase 10 meter walk test to >1.27ms as to improve gait speed for better community ambulation and to reduce fall risk.    Baseline  09/24/2019: patient selected gait speed 0.91 m/s, fastest 1.1 m/s;    Time  8    Period  Weeks    Status  Partially Met    Target Date  10/07/19      PT LONG TERM GOAL #  3   Title  Patient will reduce timed up and go to <11 seconds to reduce fall risk and demonstrate improved transfer/gait ability.    Baseline  09/24/2019: 10.32 sec    Time  8    Period  Weeks    Status  Achieved      PT LONG TERM GOAL #4   Title  Patient will ascend/descend 4 stairs with 1 rail assist independently without loss of balance to improve ability to get in/out of home.    Baseline  09/24/2019: ascends with 0 UE support, descends with 2 UE support;    Time  8    Period  Weeks    Status  Partially Met    Target Date  10/07/19              Patient will benefit from skilled therapeutic intervention in order to improve the following deficits and impairments:     Visit Diagnosis: No diagnosis found.     Problem List Patient Active Problem List   Diagnosis Date Noted  . Vitamin D deficiency 07/20/2019  . 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[place]) 07/20/2019  . At high risk for falls 07/20/2019  . Mass on back 01/21/2019  . Fatigue 01/15/2019  . Parkinson disease ([place]) 01/15/2019  . Hyperlipidemia 01/15/2019  . Diabetes mellitus type 2 in obese ([place]) 01/13/2019  . Goiter 01/13/2019  . Overactive bladder 01/13/2019  . Obesity (BMI 30.0-34.9) 01/13/2019  . Medication monitoring encounter 01/13/2019  . Gait abnormality 01/13/2019   Janna Arch, PT, DPT   09/30/2019, 11:23 AM  [place] MAIN [facility] SERVICES [address] [place], [place], [address] Phone: [phone]   Fax:   [phone]  Name: Laurna Shetley MRN: [id number] Date of Birth: [birth year]

## 2019-10-05 ENCOUNTER — Encounter: Payer: Self-pay | Admitting: Physical Therapy

## 2019-10-05 ENCOUNTER — Ambulatory Visit: Payer: Medicare Other | Admitting: Physical Therapy

## 2019-10-05 ENCOUNTER — Other Ambulatory Visit: Payer: Self-pay

## 2019-10-05 DIAGNOSIS — R2681 Unsteadiness on feet: Secondary | ICD-10-CM

## 2019-10-05 DIAGNOSIS — M6281 Muscle weakness (generalized): Secondary | ICD-10-CM | POA: Diagnosis not present

## 2019-10-05 NOTE — Therapy (Signed)
[place] MAIN [facility] SERVICES [address] [place], [place], [address] Phone: [phone]   Fax:  [phone]  Physical Therapy Treatment  Patient Details  Name: Whitney [name] MRN: [id number] Date of Birth: 05-23-1941 Referring Provider (PT): Delsa Grana PA   Encounter Date: 10/05/2019  PT End of Session - 10/05/19 1104    Visit Number  13    Number of Visits  17    Date for PT Re-Evaluation  10/07/19    PT Start Time  1101    PT Stop Time  1139    PT Time Calculation (min)  38 min    Equipment Utilized During Treatment  Gait belt    Activity Tolerance  Patient tolerated treatment well    Behavior During Therapy  [facility] for tasks assessed/performed       Past Medical History:  Diagnosis Date  . Arthritis   . Depression   . Diabetes mellitus without complication ([place])   . Goiter   . Overactive bladder   . Parkinson's disease ([place])   . Unsteady gait     Past Surgical History:  Procedure Laterality Date  . ABDOMINAL HYSTERECTOMY    . BLADDER REPAIR    . NECK SURGERY      There were no vitals filed for this visit.  Subjective Assessment - 10/05/19 1103    Subjective  Patient reports compliance with HEP, no falls or LOB since last session.    Pertinent History  78 yo Female reports having difficulty walking with increased shuffling feet especially in crowded environments over last 2 years. She reports approximately 3 falls in last 6 months. She reports when she was leaning over she just kept going and fell forward; PMH significant for neuropathy with numbness in plantar surface of feet to toes (over last year); She has also been diagnosed with Parkinson's Disease although some physicians say that she doesn't have it. She has been taking Sinemet with no change in symptoms (4 months); She is following up with neurologist in 3 months;    Limitations  Standing;Walking    How long can you sit comfortably?  NA    How long can you stand  comfortably?  Patient often leans forward on counter/buggy with prolonged standing due to fatigue; denies any back pain;    How long can you walk comfortably?  about 200-300 feet; has difficulty changing surfaces/narrow environments    Diagnostic tests  MRI in June 2020- no acute abnormality, mild-mod chronic small vessel disease noted;    Patient Stated Goals  be able to walk better; improve speed/distance; reduce fall risk;    Currently in Pain?  No/denies    Pain Score  0-No pain    Pain Onset  In the past 7 days       Neuromuscular Re-education  Matirx x 22. 5 lbs fwd/bwd, side to side x 5  Rocker board fwd/bwd, side to side x 20 each direction Heel/toe raises without UE support 3s hold x 10 each 1/2 foam roll balance with flat side up 30s x 2 reps 1/2 foam roll balance with flat side down 30s x 2 reps 1/2 foam roll tandem balance alternating forward LE 30s x 2 each LE forward  Pt educated throughout session about proper posture and technique with exercises. Improved exercise technique, movement at target joints, use of target muscles after min to mod verbal, visual, tactile cues. CGA and Min to mod verbal cues used throughout with increased in postural  sway and LOB most seen with narrow base of support and while on uneven surfaces. Continues to have balance deficits typical with diagnosis. Patient performs intermediate level exercises without pain behaviors and needs verbal cuing for postural alignment and head positioning Tactile cues and assistance needed to keep lower leg and knee in neutral to avoid compensations with ankle motions.                        PT Education - 10/05/19 1104    Education Details  HEP    Person(s) Educated  Patient    Methods  Explanation;Demonstration    Comprehension  Verbalized understanding;Returned demonstration;Need further instruction       PT Short Term Goals - 09/24/19 1502      PT SHORT TERM GOAL #1   Title  Patient  will be adherent to HEP at least 3x a week to improve functional strength and balance for better safety at home.    Time  4    Period  Weeks    Status  Achieved    Target Date  09/09/19      PT SHORT TERM GOAL #2   Title  Patient will increase BLE gross strength to 4+/5 as to improve functional strength for independent gait, increased standing tolerance and increased ADL ability.    Time  4    Period  Weeks    Status  Partially Met    Target Date  10/07/19        PT Long Term Goals - 09/24/19 1503      PT LONG TERM GOAL #1   Title  Patient will increase Functional Gait Assessment score to >20/30 as to reduce fall risk and improve dynamic gait safety with community ambulation.    Baseline  09/24/2019: 20/30, continues to be a falls risk;    Time  8    Period  Weeks    Status  Partially Met    Target Date  10/07/19      PT LONG TERM GOAL #2   Title  Patient will increase 10 meter walk test to >1.74ms as to improve gait speed for better community ambulation and to reduce fall risk.    Baseline  09/24/2019: patient selected gait speed 0.91 m/s, fastest 1.1 m/s;    Time  8    Period  Weeks    Status  Partially Met    Target Date  10/07/19      PT LONG TERM GOAL #3   Title  Patient will reduce timed up and go to <11 seconds to reduce fall risk and demonstrate improved transfer/gait ability.    Baseline  09/24/2019: 10.32 sec    Time  8    Period  Weeks    Status  Achieved      PT LONG TERM GOAL #4   Title  Patient will ascend/descend 4 stairs with 1 rail assist independently without loss of balance to improve ability to get in/out of home.    Baseline  09/24/2019: ascends with 0 UE support, descends with 2 UE support;    Time  8    Period  Weeks    Status  Partially Met    Target Date  10/07/19            Plan - 10/05/19 1105    Clinical Impression Statement  Pt was able to perform all exercises today with CGA..Marland KitchenPt was able to perform all balance and  strength  exercises, demonstrating improvements in LE strength and stability.  Pt was able to complete dynamic balance exercises, showing ability to stand on even surfaces with min assist and improve postural reactions to correct self during activities.  Pt requires verbal, visual and tactile cues during exercise in order to complete tasks with proper form and technique, as well as to stay on task.  Pt would continue to benefit from skilled PT services in order to further strengthen LE's, improve static and dynamic balance, and improve coordination in order to increase functional mobility and decrease risk of falls   Personal Factors and Comorbidities  Age;Comorbidity 3+;Time since onset of injury/illness/exacerbation;Transportation    Comorbidities  Parkinson's, diabetes, depression, arthritis    Examination-Activity Limitations  Locomotion Level;Stairs;Stand;Transfers    Examination-Participation Restrictions  Cleaning;Community Activity;Driving;Shop;Volunteer;Yard Work    [employer]  Evolving/Moderate complexity    Rehab Potential  Fair    PT Frequency  2x / week    PT Duration  8 weeks    PT Treatment/Interventions  Moist Heat;Cryotherapy;Gait training;Stair training;Functional mobility training;Therapeutic activities;Therapeutic exercise;Balance training;Neuromuscular re-education;Patient/family education;Passive range of motion;Energy conservation    PT Next Visit Plan  work on balance/HEP; freezing and FOG episode cueing    PT Home Exercise Plan  initiated    Consulted and Agree with Plan of Care  Patient       Patient will benefit from skilled therapeutic intervention in order to improve the following deficits and impairments:  Abnormal gait, Decreased balance, Decreased endurance, Decreased mobility, Difficulty walking, Decreased strength, Decreased safety awareness, Decreased activity tolerance  Visit Diagnosis: Unsteadiness on feet  Muscle weakness  (generalized)     Problem List Patient Active Problem List   Diagnosis Date Noted  . Vitamin D deficiency 07/20/2019  . 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[place]) 07/20/2019  . At high risk for falls 07/20/2019  . Mass on back 01/21/2019  . Fatigue 01/15/2019  . Parkinson disease ([place]) 01/15/2019  . Hyperlipidemia 01/15/2019  . Diabetes mellitus type 2 in obese ([place]) 01/13/2019  . Goiter 01/13/2019  . Overactive bladder 01/13/2019  . Obesity (BMI 30.0-34.9) 01/13/2019  . Medication monitoring encounter 01/13/2019  . Gait abnormality 01/13/2019    Arelia Sneddon S the patient DPT 10/05/2019, 11:05 AM  [place] MAIN [facility] SERVICES [address] [place], [place], [address] Phone: [phone]   Fax:  [phone]  Name: Hanifah Royse MRN: [id number] Date of Birth: 1941/02/14

## 2019-10-07 ENCOUNTER — Ambulatory Visit: Payer: Medicare Other | Admitting: Physical Therapy

## 2019-10-07 ENCOUNTER — Other Ambulatory Visit: Payer: Self-pay

## 2019-10-07 ENCOUNTER — Encounter: Payer: Self-pay | Admitting: Physical Therapy

## 2019-10-07 DIAGNOSIS — M6281 Muscle weakness (generalized): Secondary | ICD-10-CM

## 2019-10-07 DIAGNOSIS — R2681 Unsteadiness on feet: Secondary | ICD-10-CM

## 2019-10-07 NOTE — Therapy (Signed)
[place] MAIN [facility] SERVICES [address] [place], [place], [address] Phone: [phone]   Fax:  [phone]  Physical Therapy Treatment  Patient Details  Name: Whitney [name] MRN: [id number] Date of Birth: 19-Jun-1941 Referring Provider (PT): Delsa Grana PA   Encounter Date: 10/07/2019  PT End of Session - 10/07/19 1244    Visit Number  14    Number of Visits  17    Date for PT Re-Evaluation  10/07/19    PT Start Time  4193    PT Stop Time  1230    PT Time Calculation (min)  32 min    Equipment Utilized During Treatment  Gait belt    Activity Tolerance  Patient tolerated treatment well    Behavior During Therapy  [facility] for tasks assessed/performed       Past Medical History:  Diagnosis Date  . Arthritis   . Depression   . Diabetes mellitus without complication ([place])   . Goiter   . Overactive bladder   . Parkinson's disease ([place])   . Unsteady gait     Past Surgical History:  Procedure Laterality Date  . ABDOMINAL HYSTERECTOMY    . BLADDER REPAIR    . NECK SURGERY      There were no vitals filed for this visit.  Subjective Assessment - 10/07/19 1158    Subjective  Patient states she is feeling sore in her hips today from last session. Otherwise she is doing fine today.    Pertinent History  78 yo Female reports having difficulty walking with increased shuffling feet especially in crowded environments over last 2 years. She reports approximately 3 falls in last 6 months. She reports when she was leaning over she just kept going and fell forward; PMH significant for neuropathy with numbness in plantar surface of feet to toes (over last year); She has also been diagnosed with Parkinson's Disease although some physicians say that she doesn't have it. She has been taking Sinemet with no change in symptoms (4 months); She is following up with neurologist in 3 months;    Limitations  Standing;Walking    How long can you sit  comfortably?  NA    How long can you stand comfortably?  Patient often leans forward on counter/buggy with prolonged standing due to fatigue; denies any back pain;    How long can you walk comfortably?  about 200-300 feet; has difficulty changing surfaces/narrow environments    Diagnostic tests  MRI in June 2020- no acute abnormality, mild-mod chronic small vessel disease noted;    Patient Stated Goals  be able to walk better; improve speed/distance; reduce fall risk;    Pain Score  3     Pain Location  Hip    Pain Orientation  Right;Left    Pain Descriptors / Indicators  Sore    Pain Onset  In the past 7 days    Pain Frequency  Constant    Multiple Pain Sites  No        TREATMENT:  NMRE:  Matrix walkouts x22.5# fwd/bwd, bwd/fwd x3 laps each direction; cues to take bigger steps when ambulating bwd.  Alternating toe taps to 6" step from airex foam x20  Alternating step ups to 6" step from airex foam x10  Stair negotiation x4 steps x3 sets up/down with 1 rail assist, followed by 1 set without rail assist; able to complete with 1 foot per step using 1 rail assist up and down, but has  step-to pattern when ascending/descending without rail assist.   Speed ladder x1 lap of // bars fwd/bwd without mental task followed by x3 laps of // bars fwd/bwd while making grocery list; able to make full steps with 1 foot per square without multitask, but begins to take shorter steps and step on rungs when making list.   Speed ladder x1 laps // bars side/side without mental task followed by x3 laps of // bars side/side while counting up by 5's; able to avoid rungs, but makes shorter steps with multitasking.   1/2 foam roll tandem balance alternating forward LE 30s x 2 each LE forward  Therex: High marches with 4# ankle weights x3 laps of // bars; cues to lift feet high High sidesteps with 4# ankle weights x3 laps of // bars Standing hip ext with 4# ankle weights 2x10 BLE; cues to keep knee extended,  required demonstration  Pt educated throughout session about proper posture and technique with exercises. Improved exercise technique, movement at target joints, use of target muscles after min to mod verbal, visual, tactile cues.   Patient demonstrates excellent motivation throughout today's session. Patient was able to reproduce the qualitative aspects of balance interventions, required min VC for sequencing and step length. Patient continues to be challenged by shuffling gait, especially when holding a conversation or performing cognitive task, which improved with mod VC for step length and repetition. Patient would continue to benefit from skilled PT to address the deficits outlined in this note.       PT Education - 10/07/19 1243    Education Details  HEP, stair negotiation, strengthening    Person(s) Educated  Patient    Methods  Explanation;Demonstration    Comprehension  Returned demonstration;Verbal cues required;Need further instruction;Verbalized understanding       PT Short Term Goals - 10/08/19 1108      PT SHORT TERM GOAL #1   Title  Patient will be adherent to HEP at least 3x a week to improve functional strength and balance for better safety at home.    Time  4    Period  Weeks    Status  Achieved    Target Date  09/09/19      PT SHORT TERM GOAL #2   Title  Patient will increase BLE gross strength to 4+/5 as to improve functional strength for independent gait, increased standing tolerance and increased ADL ability.    Time  4    Period  Weeks    Status  Partially Met    Target Date  11/05/19        PT Long Term Goals - 10/08/19 1108      PT LONG TERM GOAL #1   Title  Patient will increase Functional Gait Assessment score to >20/30 as to reduce fall risk and improve dynamic gait safety with community ambulation.    Baseline  09/24/2019: 20/30, continues to be a falls risk;    Time  4    Period  Weeks    Status  Partially Met    Target Date  11/05/19       PT LONG TERM GOAL #2   Title  Patient will increase 10 meter walk test to >1.43ms as to improve gait speed for better community ambulation and to reduce fall risk.    Baseline  09/24/2019: patient selected gait speed 0.91 m/s, fastest 1.1 m/s;    Time  4    Period  Weeks    Status  Partially Met  Target Date  11/05/19      PT LONG TERM GOAL #3   Title  Patient will reduce timed up and go to <11 seconds to reduce fall risk and demonstrate improved transfer/gait ability.    Baseline  09/24/2019: 10.32 sec    Time  8    Period  Weeks    Status  Achieved      PT LONG TERM GOAL #4   Title  Patient will ascend/descend 4 stairs with 1 rail assist independently without loss of balance to improve ability to get in/out of home.    Baseline  09/24/2019: ascends with 0 UE support, descends with 2 UE support;    Time  4    Period  Weeks    Status  Partially Met    Target Date  11/05/19            Plan - 10/07/19 1243    Clinical Impression Statement  Pt late to session; Patient demonstrates excellent motivation throughout today's session. Patient was able to reproduce the qualitative aspects of balance interventions, required min VC for sequencing and step length. Patient continues to be challenged by shuffling gait, especially when holding a conversation or performing cognitive task, which improved with mod VC for step length and repetition. Patient would continue to benefit from skilled PT to address the deficits outlined in this note.    Personal Factors and Comorbidities  Age;Comorbidity 3+;Time since onset of injury/illness/exacerbation;Transportation    Comorbidities  Parkinson's, diabetes, depression, arthritis    Examination-Activity Limitations  Locomotion Level;Stairs;Stand;Transfers    Examination-Participation Restrictions  Cleaning;Community Activity;Driving;Shop;Volunteer;Yard Work    [employer] officer  Evolving/Moderate complexity    Rehab Potential  Fair     PT Frequency  2x / week    PT Duration  4 weeks    PT Treatment/Interventions  Moist Heat;Cryotherapy;Gait training;Stair training;Functional mobility training;Therapeutic activities;Therapeutic exercise;Balance training;Neuromuscular re-education;Patient/family education;Passive range of motion;Energy conservation    PT Next Visit Plan  work on balance/HEP; freezing and FOG episode cueing, cognitive multitasking    PT Home Exercise Plan  initiated    Consulted and Agree with Plan of Care  Patient       Patient will benefit from skilled therapeutic intervention in order to improve the following deficits and impairments:  Abnormal gait, Decreased balance, Decreased endurance, Decreased mobility, Difficulty walking, Decreased strength, Decreased safety awareness, Decreased activity tolerance  Visit Diagnosis: Unsteadiness on feet  Muscle weakness (generalized)     Problem List Patient Active Problem List   Diagnosis Date Noted  . Vitamin D deficiency 07/20/2019  . 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[place]) 07/20/2019  . At high risk for falls 07/20/2019  . Mass on back 01/21/2019  . Fatigue 01/15/2019  . Parkinson disease ([place]) 01/15/2019  . Hyperlipidemia 01/15/2019  . Diabetes mellitus type 2 in obese ([place]) 01/13/2019  . Goiter 01/13/2019  . Overactive bladder 01/13/2019  . Obesity (BMI 30.0-34.9) 01/13/2019  . Medication monitoring encounter 01/13/2019  . Gait abnormality 01/13/2019   Jeneen Rinks A. Rosana Hoes, SPT This entire session was performed under direct supervision and direction of a licensed therapist/therapist assistant . I have personally read, edited and approve of the note as written.  Trotter,Margaret PT, DPT 10/08/2019, 11:10 AM  [place] MAIN [facility] SERVICES [address] [place], [place], [address] Phone: [phone]   Fax:  [phone]  Name: Whitney [name] MRN: [id number] Date of Birth: 28-Jul-1941

## 2019-10-10 ENCOUNTER — Ambulatory Visit: Admit: 2019-10-10 | Admitting: Otolaryngology

## 2019-10-10 SURGERY — THYROIDECTOMY
Anesthesia: General | Laterality: Left

## 2019-10-12 ENCOUNTER — Other Ambulatory Visit: Payer: Self-pay | Admitting: Family Medicine

## 2019-10-13 ENCOUNTER — Ambulatory Visit: Payer: Medicare Other | Attending: Family Medicine | Admitting: Physical Therapy

## 2019-10-13 ENCOUNTER — Encounter: Payer: Self-pay | Admitting: Physical Therapy

## 2019-10-13 ENCOUNTER — Other Ambulatory Visit: Payer: Self-pay

## 2019-10-13 DIAGNOSIS — M6281 Muscle weakness (generalized): Secondary | ICD-10-CM | POA: Insufficient documentation

## 2019-10-13 DIAGNOSIS — R2681 Unsteadiness on feet: Secondary | ICD-10-CM | POA: Insufficient documentation

## 2019-10-13 NOTE — Therapy (Signed)
[place] MAIN [facility] SERVICES [address] [place], [place], [address] Phone: [phone]   Fax:  [phone]  Physical Therapy Treatment  Patient Details  Name: Whitney [name] MRN: [id number] Date of Birth: December 21, 1940 Referring Provider (PT): Delsa Grana PA   Encounter Date: 10/13/2019  PT End of Session - 10/13/19 1106    Visit Number  15    Number of Visits  25    Date for PT Re-Evaluation  11/05/19    PT Start Time  1105    PT Stop Time  1148    PT Time Calculation (min)  43 min    Equipment Utilized During Treatment  Gait belt    Activity Tolerance  Patient tolerated treatment well    Behavior During Therapy  [facility] for tasks assessed/performed       Past Medical History:  Diagnosis Date  . Arthritis   . Depression   . Diabetes mellitus without complication ([place])   . Goiter   . Overactive bladder   . Parkinson's disease ([place])   . Unsteady gait     Past Surgical History:  Procedure Laterality Date  . ABDOMINAL HYSTERECTOMY    . BLADDER REPAIR    . NECK SURGERY      There were no vitals filed for this visit.  Subjective Assessment - 10/13/19 1111    Subjective  Patient reports doing well; Denies any soreness; Reports still working on HEP well with good tolerance and challenge; Denies any new falls;    Pertinent History  78 yo Female reports having difficulty walking with increased shuffling feet especially in crowded environments over last 2 years. She reports approximately 3 falls in last 6 months. She reports when she was leaning over she just kept going and fell forward; PMH significant for neuropathy with numbness in plantar surface of feet to toes (over last year); She has also been diagnosed with Parkinson's Disease although some physicians say that she doesn't have it. She has been taking Sinemet with no change in symptoms (4 months); She is following up with neurologist in 3 months;    Limitations  Standing;Walking     How long can you sit comfortably?  NA    How long can you stand comfortably?  Patient often leans forward on counter/buggy with prolonged standing due to fatigue; denies any back pain;    How long can you walk comfortably?  about 200-300 feet; has difficulty changing surfaces/narrow environments    Diagnostic tests  MRI in June 2020- no acute abnormality, mild-mod chronic small vessel disease noted;    Patient Stated Goals  be able to walk better; improve speed/distance; reduce fall risk;    Currently in Pain?  No/denies    Pain Onset  In the past 7 days    Multiple Pain Sites  No           TREATMENT: Warm up on Crosstrainer level 2 x4 min (unbilled);  Exercise:  Standing on 6 inch step, eccentric lower sideways with B rail assist x10 reps each LE with min A for safety and cues for weight shift and control;  Sit<>Stand from regular chair with yellow weighted ball overhead lift x10 reps with cues for forward weight shift for better transfer ability;   Patient complained of increased knee discomfort following exercise; Instructed pt in seated using rolling stick x1 min each LE quad to help reduce stiffness/soreness. Patient tolerated well reporting significant reduction in discomfort;   NMR: Stepping over  orange hurdles reciprocally #2, x5 sets unsupported, supervision with cues to increase step length;  Side step over orange hurdles #2, x5 laps each direction with cues to increase step length for better foot clearance and safety; Pt fatigues with increased repetition;   Weaving around cones #6 x4 laps unsupported with cues to increase step length and improve foot clearance for better dynamic balance safety;  Standing on firm surface: Alternate toe taps to cone x10 reps each LE CGA for safety; Pt able to exhibit good weight shift and stance control;   Speed ladder: Forward walking reciprocally x2 laps with patient looking down at feet with good foot clearance; Forward walking  reciprocally with min VCs to improve erect head/looking up x2 laps with increased challenge clearing foot often catching heel on rung; Forward walking reciprocally with small ball pass side/side x4 laps with cues for dual task coordination to reach for ball while stepping; Forward walking reciprocally while holding ball and raising arms up/down x2 laps;   Side stepping in ladder x2 laps each direction with cues to improve erect posture and to avoid looking down to challenge dynamic balance control; Patient exhibits shorter step length and decreased foot clearance when looking up compared to looking down;   Pt educated throughout session about proper posture and technique with exercises. Improved exercise technique, movement at target joints, use of target muscles after min to mod verbal, visual, tactile cues.  Patient demonstrates excellent motivation throughout today's session.She does exhibit increased difficulty with stepping and decreased foot clearance when cued to look up and not look down at feet. Reinforced HEP with good understanding; Reports less knee discomfort following rolling stick with better standing and walking tolerance;                      PT Education - 10/13/19 1111    Education Details  LE strength, balance, HEP    Person(s) Educated  Patient    Methods  Explanation;Verbal cues    Comprehension  Verbalized understanding;Returned demonstration;Verbal cues required;Need further instruction       PT Short Term Goals - 10/08/19 1108      PT SHORT TERM GOAL #1   Title  Patient will be adherent to HEP at least 3x a week to improve functional strength and balance for better safety at home.    Time  4    Period  Weeks    Status  Achieved    Target Date  09/09/19      PT SHORT TERM GOAL #2   Title  Patient will increase BLE gross strength to 4+/5 as to improve functional strength for independent gait, increased standing tolerance and increased ADL  ability.    Time  4    Period  Weeks    Status  Partially Met    Target Date  11/05/19        PT Long Term Goals - 10/08/19 1108      PT LONG TERM GOAL #1   Title  Patient will increase Functional Gait Assessment score to >20/30 as to reduce fall risk and improve dynamic gait safety with community ambulation.    Baseline  09/24/2019: 20/30, continues to be a falls risk;    Time  4    Period  Weeks    Status  Partially Met    Target Date  11/05/19      PT LONG TERM GOAL #2   Title  Patient will increase 10 meter walk  test to >1.41ms as to improve gait speed for better community ambulation and to reduce fall risk.    Baseline  09/24/2019: patient selected gait speed 0.91 m/s, fastest 1.1 m/s;    Time  4    Period  Weeks    Status  Partially Met    Target Date  11/05/19      PT LONG TERM GOAL #3   Title  Patient will reduce timed up and go to <11 seconds to reduce fall risk and demonstrate improved transfer/gait ability.    Baseline  09/24/2019: 10.32 sec    Time  8    Period  Weeks    Status  Achieved      PT LONG TERM GOAL #4   Title  Patient will ascend/descend 4 stairs with 1 rail assist independently without loss of balance to improve ability to get in/out of home.    Baseline  09/24/2019: ascends with 0 UE support, descends with 2 UE support;    Time  4    Period  Weeks    Status  Partially Met    Target Date  11/05/19            Plan - 10/13/19 1158    Clinical Impression Statement  Patient motivated and participated well within session. instructed patient in advanced balance tasks challenging dynamic balance control with cues to reduce looking down at feet. She does exhibit increased shuffle/foot drag when looking down at feet; She requires cues for proper positioning and to increase step length when negotiating obstacles to improve foot clearance. 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    Rehab Potential  Fair    PT Frequency  2x / week    PT Duration  4 weeks    PT Treatment/Interventions  Moist Heat;Cryotherapy;Gait training;Stair training;Functional mobility training;Therapeutic activities;Therapeutic exercise;Balance training;Neuromuscular re-education;Patient/family education;Passive range of motion;Energy conservation    PT Next Visit Plan  work on balance/HEP; freezing and FOG episode cueing, cognitive multitasking    PT Home Exercise Plan  initiated    Consulted and Agree with Plan of Care  Patient       Patient will benefit from skilled therapeutic intervention in order to improve the following deficits and impairments:  Abnormal gait, Decreased balance, Decreased endurance, Decreased mobility, Difficulty walking, Decreased strength, Decreased safety awareness, Decreased activity tolerance  Visit Diagnosis: Unsteadiness on feet  Muscle weakness (generalized)     Problem List Patient Active Problem List   Diagnosis Date Noted  . Vitamin D deficiency 07/20/2019  . 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[place] 07/20/2019  . At high risk for falls 07/20/2019  . Mass on back 01/21/2019  . Fatigue 01/15/2019  . Parkinson disease ([place] 01/15/2019  . Hyperlipidemia 01/15/2019  . Diabetes mellitus type 2 in obese ([place] 01/13/2019  . Goiter 01/13/2019  . Overactive bladder 01/13/2019  . Obesity (BMI 30.0-34.9) 01/13/2019  . Medication monitoring encounter 01/13/2019  . Gait abnormality 01/13/2019    Whitney [name] PT,  DPT 10/13/2019, 2:41 PM  [place] [facility] [address] [place] [address]Phone: [phone]  Fax:  [phone]  Name: Whitney [name] MRN: [id number] Date of Birth: October 19, 1941

## 2019-10-15 ENCOUNTER — Other Ambulatory Visit: Payer: Self-pay

## 2019-10-15 ENCOUNTER — Encounter: Payer: Self-pay | Admitting: Physical Therapy

## 2019-10-15 ENCOUNTER — Ambulatory Visit: Payer: Medicare Other | Admitting: Physical Therapy

## 2019-10-15 DIAGNOSIS — M6281 Muscle weakness (generalized): Secondary | ICD-10-CM

## 2019-10-15 DIAGNOSIS — R2681 Unsteadiness on feet: Secondary | ICD-10-CM

## 2019-10-15 NOTE — Therapy (Signed)
[place] MAIN [facility] SERVICES [address] [place], [place], [address] Phone: [phone]   Fax:  [phone]  Physical Therapy Treatment  Patient Details  Name: Whitney [name] MRN: [id number] Date of Birth: 1941-12-01 Referring Provider (PT): Delsa Grana PA   Encounter Date: 10/15/2019  PT End of Session - 10/15/19 1110    Visit Number  16    Number of Visits  25    Date for PT Re-Evaluation  11/05/19    PT Start Time  1104    PT Stop Time  1145    PT Time Calculation (min)  41 min    Equipment Utilized During Treatment  Gait belt    Activity Tolerance  Patient tolerated treatment well;No increased pain    Behavior During Therapy  WFL for tasks assessed/performed       Past Medical History:  Diagnosis Date  . Arthritis   . Depression   . Diabetes mellitus without complication ([place])   . Goiter   . Overactive bladder   . Parkinson's disease ([place])   . Unsteady gait     Past Surgical History:  Procedure Laterality Date  . ABDOMINAL HYSTERECTOMY    . BLADDER REPAIR    . NECK SURGERY      There were no vitals filed for this visit.  Subjective Assessment - 10/15/19 1109    Subjective  Patient reports doing well; She states that her legs are better and her balance has been doing a lot better;    Pertinent History  78 yo Female reports having difficulty walking with increased shuffling feet especially in crowded environments over last 2 years. She reports approximately 3 falls in last 6 months. She reports when she was leaning over she just kept going and fell forward; PMH significant for neuropathy with numbness in plantar surface of feet to toes (over last year); She has also been diagnosed with Parkinson's Disease although some physicians say that she doesn't have it. She has been taking Sinemet with no change in symptoms (4 months); She is following up with neurologist in 3 months;    Limitations  Standing;Walking    How long can  you sit comfortably?  NA    How long can you stand comfortably?  Patient often leans forward on counter/buggy with prolonged standing due to fatigue; denies any back pain;    How long can you walk comfortably?  about 200-300 feet; has difficulty changing surfaces/narrow environments    Diagnostic tests  MRI in June 2020- no acute abnormality, mild-mod chronic small vessel disease noted;    Patient Stated Goals  be able to walk better; improve speed/distance; reduce fall risk;    Currently in Pain?  No/denies    Pain Onset  In the past 7 days    Multiple Pain Sites  No           TREATMENT: Warm up on Crosstrainer level 2 x4 min (unbilled);  Exercise:    Standing on 6 inch step, eccentric lower sideways with B rail assist x10 reps each LE with min A for safety and cues for weight shift and control;   Sit<>Stand from regular chair with yellow weighted ball overhead lift x10 reps with cues for forward weight shift for better transfer ability;      NMR: Stepping over orange hurdles reciprocally 2# ankle weights, #2, x5 sets unsupported, supervision with cues to increase step length;  Side step over orange hurdles, 2# ankle weights,  #2,  x5 laps each direction with cues to increase step length for better foot clearance and safety; Pt fatigues with increased repetition;      Standing on  bolster (flat side up): Heel/toe raises x15 reps with rail assist for safety to challenge ankle ROM and control; Feet in neutral, BUE ball up/down x10 reps with min A for safety and cues to improve hip/ankle strategies for better dynamic balance control; Tandem stance:  Unsupported 15 sec hold with CGA for safety;  Unsupported with head turns side/side x5 reps with CGA for safety     Speed ladder: Forward walking reciprocally x2 laps with patient looking down at feet with good foot clearance; Forward walking reciprocally with min VCs to improve erect head/looking up x 2 laps with increased challenge  clearing foot often catching heel on rung; Forward walking reciprocally turning head side/side calling out cards on wall x4 laps  Required cues to increase step length and improve speed with dual task with patient having significant difficulty improving foot clearance while not looking down at feet.   Side stepping in ladder x2 laps each direction with cues to improve erect posture and to avoid looking down to challenge dynamic balance control; Patient exhibits shorter step length and decreased foot clearance when looking up compared to looking down;   4 square stepping clockwise/counterclockwise x3 laps each direction unsupported with cues to look forward and avoid looking down at feet, CGA required for safety;    Pt educated throughout session about proper posture and technique with exercises. Improved exercise technique, movement at target joints, use of target muscles after min to mod verbal, visual, tactile cues.    Patient demonstrates excellent motivation throughout today's session.She does exhibit increased difficulty with stepping and decreased foot clearance when cued to look up and not look down at feet. Reinforced HEP with good understanding; Reports less knee discomfort this session and less fatigue;                      PT Education - 10/15/19 1110    Education Details  LE strength, balance, HEP    Person(s) Educated  Patient    Methods  Explanation;Verbal cues    Comprehension  Verbalized understanding;Returned demonstration;Verbal cues required;Need further instruction       PT Short Term Goals - 10/08/19 1108      PT SHORT TERM GOAL #1   Title  Patient will be adherent to HEP at least 3x a week to improve functional strength and balance for better safety at home.    Time  4    Period  Weeks    Status  Achieved    Target Date  09/09/19      PT SHORT TERM GOAL #2   Title  Patient will increase BLE gross strength to 4+/5 as to improve functional strength  for independent gait, increased standing tolerance and increased ADL ability.    Time  4    Period  Weeks    Status  Partially Met    Target Date  11/05/19        PT Long Term Goals - 10/08/19 1108      PT LONG TERM GOAL #1   Title  Patient will increase Functional Gait Assessment score to >20/30 as to reduce fall risk and improve dynamic gait safety with community ambulation.    Baseline  09/24/2019: 20/30, continues to be a falls risk;    Time  4    Period  Weeks    Status  Partially Met    Target Date  11/05/19      PT LONG TERM GOAL #2   Title  Patient will increase 10 meter walk test to >1.16ms as to improve gait speed for better community ambulation and to reduce fall risk.    Baseline  09/24/2019: patient selected gait speed 0.91 m/s, fastest 1.1 m/s;    Time  4    Period  Weeks    Status  Partially Met    Target Date  11/05/19      PT LONG TERM GOAL #3   Title  Patient will reduce timed up and go to <11 seconds to reduce fall risk and demonstrate improved transfer/gait ability.    Baseline  09/24/2019: 10.32 sec    Time  8    Period  Weeks    Status  Achieved      PT LONG TERM GOAL #4   Title  Patient will ascend/descend 4 stairs with 1 rail assist independently without loss of balance to improve ability to get in/out of home.    Baseline  09/24/2019: ascends with 0 UE support, descends with 2 UE support;    Time  4    Period  Weeks    Status  Partially Met    Target Date  11/05/19            Plan - 10/15/19 1144    Clinical Impression Statement  Patient motivated and tolerated session well. She continues to have increased foot drag when looking up or with dual task challenges. Patient reuqires min VCs for proper exercise technique and positioning. Patient did have increased difficulty stepping over hurdles with ankle weight due to weakness and difficulty with dynamic balance. 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    Rehab Potential  Fair    PT Frequency  2x / week    PT Duration  4 weeks    PT Treatment/Interventions  Moist Heat;Cryotherapy;Gait training;Stair training;Functional mobility training;Therapeutic activities;Therapeutic exercise;Balance training;Neuromuscular re-education;Patient/family education;Passive range of motion;Energy conservation    PT Next Visit Plan  work on balance/HEP; freezing and FOG episode cueing, cognitive multitasking    PT Home Exercise Plan  initiated    Consulted and Agree with Plan of Care  Patient       Patient will benefit from skilled therapeutic intervention in order to improve the following deficits and impairments:  Abnormal gait, Decreased balance, Decreased endurance, Decreased mobility, Difficulty walking, Decreased strength, Decreased safety awareness, Decreased activity tolerance  Visit Diagnosis: Unsteadiness on feet  Muscle weakness (generalized)     Problem List Patient Active Problem List   Diagnosis Date Noted  . Vitamin D deficiency 07/20/2019  . 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[place] 07/20/2019  . At high risk for falls 07/20/2019  . Mass on back 01/21/2019  . Fatigue 01/15/2019  . Parkinson disease ([place] 01/15/2019  . Hyperlipidemia 01/15/2019  . Diabetes mellitus type 2 in obese ([place] 01/13/2019  . Goiter 01/13/2019  . Overactive bladder 01/13/2019  . Obesity (BMI 30.0-34.9) 01/13/2019  . Medication monitoring encounter 01/13/2019  . Gait abnormality 01/13/2019  Jalesa Thien PT, DPT 10/15/2019, 12:51 PM  [place] MAIN [facility] SERVICES [address] [place], [place], [address] Phone: [phone]   Fax:  [phone]  Name: Tashiya Souders MRN: [id number] Date of Birth: 1941-02-16

## 2019-10-20 ENCOUNTER — Other Ambulatory Visit: Payer: Self-pay

## 2019-10-20 ENCOUNTER — Ambulatory Visit: Payer: Medicare Other

## 2019-10-20 DIAGNOSIS — M6281 Muscle weakness (generalized): Secondary | ICD-10-CM

## 2019-10-20 DIAGNOSIS — R2681 Unsteadiness on feet: Secondary | ICD-10-CM | POA: Diagnosis not present

## 2019-10-20 NOTE — Therapy (Signed)
[place] MAIN [facility] SERVICES [address] [place], [place], [address] Phone: [phone]   Fax:  [phone]  Physical Therapy Treatment  Patient Details  Name: Whitney [name] MRN: [id number] Date of Birth: 1941-09-11 Referring Provider (PT): Delsa Grana PA   Encounter Date: 10/20/2019  PT End of Session - 10/20/19 1206    Visit Number  17    Number of Visits  25    Date for PT Re-Evaluation  11/05/19    PT Start Time  1100    PT Stop Time  1144    PT Time Calculation (min)  44 min    Equipment Utilized During Treatment  Gait belt    Activity Tolerance  Patient tolerated treatment well;No increased pain    Behavior During Therapy  WFL for tasks assessed/performed       Past Medical History:  Diagnosis Date  . Arthritis   . Depression   . Diabetes mellitus without complication ([place])   . Goiter   . Overactive bladder   . Parkinson's disease ([place])   . Unsteady gait     Past Surgical History:  Procedure Laterality Date  . ABDOMINAL HYSTERECTOMY    . BLADDER REPAIR    . NECK SURGERY      There were no vitals filed for this visit.  Subjective Assessment - 10/20/19 1103    Subjective  Patient reports compliance with HEP. Reports balance is improving with no falls or LOB since last session.    Pertinent History  78 yo Female reports having difficulty walking with increased shuffling feet especially in crowded environments over last 2 years. She reports approximately 3 falls in last 6 months. She reports when she was leaning over she just kept going and fell forward; PMH significant for neuropathy with numbness in plantar surface of feet to toes (over last year); She has also been diagnosed with Parkinson's Disease although some physicians say that she doesn't have it. She has been taking Sinemet with no change in symptoms (4 months); She is following up with neurologist in 3 months;    Limitations  Standing;Walking    How long can  you sit comfortably?  NA    How long can you stand comfortably?  Patient often leans forward on counter/buggy with prolonged standing due to fatigue; denies any back pain;    How long can you walk comfortably?  about 200-300 feet; has difficulty changing surfaces/narrow environments    Diagnostic tests  MRI in June 2020- no acute abnormality, mild-mod chronic small vessel disease noted;    Patient Stated Goals  be able to walk better; improve speed/distance; reduce fall risk;    Currently in Pain?  No/denies         Nustep Lvl 3 RPM> 60 for cardiovascular challenge 3 minutes   Treatment:  Ambulate in hallway with patient dual tasking by naming family members x 86 ft, cueing required to reduce shuffle step pattern. Ambulate with dual task of naming cities visited, improved gait mechanics with second trial of 86 ft.   ambulate in hallway with ball toss vertically in air 2x 86 ft; CGA with challenges to stability with fatigue  Ambulate in hallway with ball bounce, very challenging 2x 86 ft, two near LOB  airex pad: one foot on airex pad one foot on 7" step , hold 30 seconds each LE. x2 trials  Sit to stands with airex pad under feet 10x.; hands on knees.  RTB around ankles standing  at side of treadmill :cues for body mechanics, sequencing, and stability with CGA -Hip extension 12x each LE, cueing for upright posture -hip abduction 12x each LE -hip flexion 12x each LE  Walk backwards/forwards 30 ft. Cueing for widening BOS, CGA with PT behind patient guiding for safety. Improved stability with fluidity of movement with repetition x 3 sets.     Standing toe taps 7" step with BUE support 30 second   seated toe taps 4" step 30 second holds; challenging for patient's coordination with degradation of fluidity with repetition .    seated RTB hamstring curls 12x each LE  Pt educated throughout session about proper posture and technique with exercises. Improved exercise technique, movement  at target joints, use of target muscles after min to mod verbal, visual, tactile cues.                 PT Education - 10/20/19 1205    Education Details  exercise technique, dual tasking, widening BOS    Person(s) Educated  Patient    Methods  Explanation;Demonstration;Tactile cues;Verbal cues    Comprehension  Verbalized understanding;Returned demonstration;Verbal cues required;Tactile cues required       PT Short Term Goals - 10/08/19 1108      PT SHORT TERM GOAL #1   Title  Patient will be adherent to HEP at least 3x a week to improve functional strength and balance for better safety at home.    Time  4    Period  Weeks    Status  Achieved    Target Date  09/09/19      PT SHORT TERM GOAL #2   Title  Patient will increase BLE gross strength to 4+/5 as to improve functional strength for independent gait, increased standing tolerance and increased ADL ability.    Time  4    Period  Weeks    Status  Partially Met    Target Date  11/05/19        PT Long Term Goals - 10/08/19 1108      PT LONG TERM GOAL #1   Title  Patient will increase Functional Gait Assessment score to >20/30 as to reduce fall risk and improve dynamic gait safety with community ambulation.    Baseline  09/24/2019: 20/30, continues to be a falls risk;    Time  4    Period  Weeks    Status  Partially Met    Target Date  11/05/19      PT LONG TERM GOAL #2   Title  Patient will increase 10 meter walk test to >1.84ms as to improve gait speed for better community ambulation and to reduce fall risk.    Baseline  09/24/2019: patient selected gait speed 0.91 m/s, fastest 1.1 m/s;    Time  4    Period  Weeks    Status  Partially Met    Target Date  11/05/19      PT LONG TERM GOAL #3   Title  Patient will reduce timed up and go to <11 seconds to reduce fall risk and demonstrate improved transfer/gait ability.    Baseline  09/24/2019: 10.32 sec    Time  8    Period  Weeks    Status  Achieved       PT LONG TERM GOAL #4   Title  Patient will ascend/descend 4 stairs with 1 rail assist independently without loss of balance to improve ability to get in/out of home.  Baseline  09/24/2019: ascends with 0 UE support, descends with 2 UE support;    Time  4    Period  Weeks    Status  Partially Met    Target Date  11/05/19            Plan - 10/20/19 1206    Clinical Impression Statement  Patient presents with good motivation throughout session. She is challenged with dual task interventions as well as coordination tasks with degradation with increased task challenge and/or fatigue. She is improving with static and dynamic stability with single focus tasks as well as ability to ambulate backwards with cueing for widening BOS. She would benefit from additional skilled PT intervention to improve strength, balance and gait safety;    Personal Factors and Comorbidities  Age;Comorbidity 3+;Time since onset of injury/illness/exacerbation;Transportation    Comorbidities  Parkinson's, diabetes, depression, arthritis    Examination-Activity Limitations  Locomotion Level;Stairs;Stand;Transfers    Examination-Participation Restrictions  Cleaning;Community Activity;Driving;Shop;Volunteer;Yard Work    [employer] officer  Evolving/Moderate complexity    Rehab Potential  Fair    PT Frequency  2x / week    PT Duration  4 weeks    PT Treatment/Interventions  Moist Heat;Cryotherapy;Gait training;Stair training;Functional mobility training;Therapeutic activities;Therapeutic exercise;Balance training;Neuromuscular re-education;Patient/family education;Passive range of motion;Energy conservation    PT Next Visit Plan  work on balance/HEP; freezing and FOG episode cueing, cognitive multitasking    PT Home Exercise Plan  initiated    Consulted and Agree with Plan of Care  Patient       Patient will benefit from skilled therapeutic intervention in order to improve the following deficits and  impairments:  Abnormal gait, Decreased balance, Decreased endurance, Decreased mobility, Difficulty walking, Decreased strength, Decreased safety awareness, Decreased activity tolerance  Visit Diagnosis: Unsteadiness on feet  Muscle weakness (generalized)     Problem List Patient Active Problem List   Diagnosis Date Noted  . Vitamin D deficiency 07/20/2019  . 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[place]) 07/20/2019  . At high risk for falls 07/20/2019  . Mass on back 01/21/2019  . Fatigue 01/15/2019  . Parkinson disease ([place]) 01/15/2019  . Hyperlipidemia 01/15/2019  . Diabetes mellitus type 2 in obese ([place]) 01/13/2019  . Goiter 01/13/2019  . Overactive bladder 01/13/2019  . Obesity (BMI 30.0-34.9) 01/13/2019  . Medication monitoring encounter 01/13/2019  . Gait abnormality 01/13/2019    Janna Arch, PT, DPT   10/20/2019, 12:08 PM  [place] MAIN [facility] SERVICES [address] [place], [place], [address] Phone: [phone]   Fax:  [phone]  Name: Deshonda Cryderman MRN: [id number] Date of Birth: January 15, 1941

## 2019-10-22 ENCOUNTER — Ambulatory Visit: Payer: Medicare Other | Admitting: Physical Therapy

## 2019-10-22 ENCOUNTER — Other Ambulatory Visit: Payer: Self-pay

## 2019-10-22 ENCOUNTER — Encounter: Payer: Self-pay | Admitting: Physical Therapy

## 2019-10-22 DIAGNOSIS — M6281 Muscle weakness (generalized): Secondary | ICD-10-CM

## 2019-10-22 DIAGNOSIS — R2681 Unsteadiness on feet: Secondary | ICD-10-CM | POA: Diagnosis not present

## 2019-10-22 NOTE — Therapy (Signed)
[place] MAIN [facility] SERVICES [address] [place], [place], [address] Phone: [phone]   Fax:  [phone]  Physical Therapy Treatment  Patient Details  Name: Whitney [name] MRN: [id number] Date of Birth: February 17, 1941 Referring Provider (PT): Delsa Grana PA   Encounter Date: 10/22/2019  PT End of Session - 10/22/19 1108    Visit Number  18    Number of Visits  25    Date for PT Re-Evaluation  11/05/19    PT Start Time  1102    PT Stop Time  1145    PT Time Calculation (min)  43 min    Equipment Utilized During Treatment  Gait belt    Activity Tolerance  Patient tolerated treatment well;No increased pain    Behavior During Therapy  WFL for tasks assessed/performed       Past Medical History:  Diagnosis Date  . Arthritis   . Depression   . Diabetes mellitus without complication ([place])   . Goiter   . Overactive bladder   . Parkinson's disease ([place])   . Unsteady gait     Past Surgical History:  Procedure Laterality Date  . ABDOMINAL HYSTERECTOMY    . BLADDER REPAIR    . NECK SURGERY      There were no vitals filed for this visit.  Subjective Assessment - 10/22/19 1107    Subjective  Patient reports doing well; reports having some soreness yesterday but no pain today. Reports adherence with HEP;    Pertinent History  78 yo Female reports having difficulty walking with increased shuffling feet especially in crowded environments over last 2 years. She reports approximately 3 falls in last 6 months. She reports when she was leaning over she just kept going and fell forward; PMH significant for neuropathy with numbness in plantar surface of feet to toes (over last year); She has also been diagnosed with Parkinson's Disease although some physicians say that she doesn't have it. She has been taking Sinemet with no change in symptoms (4 months); She is following up with neurologist in 3 months;    Limitations  Standing;Walking    How  long can you sit comfortably?  NA    How long can you stand comfortably?  Patient often leans forward on counter/buggy with prolonged standing due to fatigue; denies any back pain;    How long can you walk comfortably?  about 200-300 feet; has difficulty changing surfaces/narrow environments    Diagnostic tests  MRI in June 2020- no acute abnormality, mild-mod chronic small vessel disease noted;    Patient Stated Goals  be able to walk better; improve speed/distance; reduce fall risk;    Currently in Pain?  No/denies    Multiple Pain Sites  No            TREATMENT: Warm up on Nustep level 2 x4 min (unbilled);  NMR: Gait in hallway: Forward walking with head turn side/side x200 feet calling out cards Forward walking with head nods up/down x200 feet Forward/backward directional change walking x100 feet Forward walk with high knee march and alternate UE lift x50 feet with cues for coordination and sequencing; Required close supervision and mod VCs to increase step length for better reciprocal gait pattern with dynamic dual tasks. Exhibits increased foot drag when turning head or doing dual task;   Stepping over orange hurdles reciprocally 2# ankle weights, #2, x5 sets unsupported, supervision with cues to increase step length;  Side step over orange hurdles, 2#  ankle weights,  #2, x5 laps each direction with cues to increase step length for better foot clearance and safety; Pt fatigues with increased repetition;   Sit<>Stand from regular chair with arms by side x10 reps   Advanced HEP: Exercises  Heel rises with counter support - 10 reps - 1 sets - 3 sec hold  Lunge with Counter Support - 10 reps Standing March with Alternating Med [facility] - 10 reps  Half Tandem Stance Balance with Head Rotation - 10 reps each foot in front Half Tandem Stance Balance with Head Nods - 10 reps each foot in front Required mod VCs for correct exercise technique and to utilize counter support as  needed for balance;   Pt educated throughout session about proper posture and technique with exercises. Improved exercise technique, movement at target joints, use of target muscles after min to mod verbal, visual, tactile cues.  Patient demonstratesexcellentmotivation throughout today's session.She does exhibit increased difficulty with stepping and decreased foot clearance when cued to look up and not look down at feet. Reinforced HEP with good understanding;Reports less knee discomfort this session and less fatigue;                       PT Education - 10/22/19 1108    Education Details  exercise technique, dual task, HEP,    Person(s) Educated  Patient    Methods  Explanation;Verbal cues    Comprehension  Verbalized understanding;Returned demonstration;Verbal cues required;Need further instruction       PT Short Term Goals - 10/08/19 1108      PT SHORT TERM GOAL #1   Title  Patient will be adherent to HEP at least 3x a week to improve functional strength and balance for better safety at home.    Time  4    Period  Weeks    Status  Achieved    Target Date  09/09/19      PT SHORT TERM GOAL #2   Title  Patient will increase BLE gross strength to 4+/5 as to improve functional strength for independent gait, increased standing tolerance and increased ADL ability.    Time  4    Period  Weeks    Status  Partially Met    Target Date  11/05/19        PT Long Term Goals - 10/08/19 1108      PT LONG TERM GOAL #1   Title  Patient will increase Functional Gait Assessment score to >20/30 as to reduce fall risk and improve dynamic gait safety with community ambulation.    Baseline  09/24/2019: 20/30, continues to be a falls risk;    Time  4    Period  Weeks    Status  Partially Met    Target Date  11/05/19      PT LONG TERM GOAL #2   Title  Patient will increase 10 meter walk test to >1.13ms as to improve gait speed for better community ambulation and to  reduce fall risk.    Baseline  09/24/2019: patient selected gait speed 0.91 m/s, fastest 1.1 m/s;    Time  4    Period  Weeks    Status  Partially Met    Target Date  11/05/19      PT LONG TERM GOAL #3   Title  Patient will reduce timed up and go to <11 seconds to reduce fall risk and demonstrate improved transfer/gait ability.    Baseline  09/24/2019: 10.32 sec    Time  8    Period  Weeks    Status  Achieved      PT LONG TERM GOAL #4   Title  Patient will ascend/descend 4 stairs with 1 rail assist independently without loss of balance to improve ability to get in/out of home.    Baseline  09/24/2019: ascends with 0 UE support, descends with 2 UE support;    Time  4    Period  Weeks    Status  Partially Met    Target Date  11/05/19            Plan - 10/22/19 1336    Clinical Impression Statement  Patient motivated and tolerated session well. Patient instructed in advanced balance/strength exercise as part of HEP. Patient requires min VCs for correct exercise technique and positioning for optimal muscle activation and positioning. She had significant difficulty with dual task alternate UE/LE lift with uneven cadence and imbalance. Patient would benefit from additioanl skilled PT intervention to improve strength, balance and gait safety;    Personal Factors and Comorbidities  Age;Comorbidity 3+;Time since onset of injury/illness/exacerbation;Transportation    Comorbidities  Parkinson's, diabetes, depression, arthritis    Examination-Activity Limitations  Locomotion Level;Stairs;Stand;Transfers    Examination-Participation Restrictions  Cleaning;Community Activity;Driving;Shop;Volunteer;Yard Work    [employer] officer  Evolving/Moderate complexity    Rehab Potential  Fair    PT Frequency  2x / week    PT Duration  4 weeks    PT Treatment/Interventions  Moist Heat;Cryotherapy;Gait training;Stair training;Functional mobility training;Therapeutic  activities;Therapeutic exercise;Balance training;Neuromuscular re-education;Patient/family education;Passive range of motion;Energy conservation    PT Next Visit Plan  work on balance/HEP; freezing and FOG episode cueing, cognitive multitasking    PT Home Exercise Plan  initiated    Consulted and Agree with Plan of Care  Patient       Patient will benefit from skilled therapeutic intervention in order to improve the following deficits and impairments:  Abnormal gait, Decreased balance, Decreased endurance, Decreased mobility, Difficulty walking, Decreased strength, Decreased safety awareness, Decreased activity tolerance  Visit Diagnosis: Unsteadiness on feet  Muscle weakness (generalized)     Problem List Patient Active Problem List   Diagnosis Date Noted  . Vitamin D deficiency 07/20/2019  . 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[place]) 07/20/2019  . At high risk for falls 07/20/2019  . Mass on back 01/21/2019  . Fatigue 01/15/2019  . Parkinson disease ([place]) 01/15/2019  . Hyperlipidemia 01/15/2019  . Diabetes mellitus type 2 in obese ([place]) 01/13/2019  . Goiter 01/13/2019  . Overactive bladder 01/13/2019  . Obesity (BMI 30.0-34.9) 01/13/2019  . Medication monitoring encounter 01/13/2019  . Gait abnormality 01/13/2019    Trotter,Margaret PT, DPT 10/22/2019, 1:40 PM  [place] MAIN [facility] SERVICES [address] [place], [place], [address] Phone: [phone]   Fax:  [phone]  Name: Whitney [name] MRN: [id number] Date of Birth: 1941/04/28

## 2019-10-22 NOTE — Patient Instructions (Signed)
Access Code: [email or web address]  URL: [email or web address]  Date: 10/22/2019  Prepared by: Blanche East   Exercises  Heel rises with counter support - 10-15 reps - 1 sets - 3 sec hold - 1x daily - 7x weekly  Lunge with Counter Support - 10 reps - 2 sets - 1x daily - 7x weekly  Standing March with Alternating Med [facility] - 10 reps - 1 sets - 1x daily - 7x weekly  Half Tandem Stance Balance with Head Rotation - 10 reps - 1 sets - 1x daily - 7x weekly  Half Tandem Stance Balance with Head Nods - 10 reps - 1 sets - 1x daily - 7x weekly

## 2019-10-27 ENCOUNTER — Other Ambulatory Visit: Payer: Self-pay

## 2019-10-27 ENCOUNTER — Ambulatory Visit: Payer: Medicare Other | Admitting: Physical Therapy

## 2019-10-27 ENCOUNTER — Encounter: Payer: Self-pay | Admitting: Physical Therapy

## 2019-10-27 DIAGNOSIS — R2681 Unsteadiness on feet: Secondary | ICD-10-CM

## 2019-10-27 DIAGNOSIS — M6281 Muscle weakness (generalized): Secondary | ICD-10-CM

## 2019-10-27 NOTE — Therapy (Signed)
[place] MAIN [facility] SERVICES [address] [place], [place], [address] Phone: [phone]   Fax:  [phone]  Physical Therapy Treatment  Patient Details  Name: Whitney [name] MRN: [id number] Date of Birth: March 17, 1941 Referring Provider (PT): Delsa Grana PA   Encounter Date: 10/27/2019  PT End of Session - 10/27/19 1010    Visit Number  19    Number of Visits  25    Date for PT Re-Evaluation  11/05/19    PT Start Time  1010    PT Stop Time  1050    PT Time Calculation (min)  40 min    Equipment Utilized During Treatment  Gait belt    Activity Tolerance  Patient tolerated treatment well;No increased pain    Behavior During Therapy  WFL for tasks assessed/performed       Past Medical History:  Diagnosis Date  . Arthritis   . Depression   . Diabetes mellitus without complication ([place])   . Goiter   . Overactive bladder   . Parkinson's disease ([place])   . Unsteady gait     Past Surgical History:  Procedure Laterality Date  . ABDOMINAL HYSTERECTOMY    . BLADDER REPAIR    . NECK SURGERY      There were no vitals filed for this visit.  Subjective Assessment - 10/27/19 1009    Subjective  Patient reports no pain and no new concerns.    Pertinent History  78 yo Female reports having difficulty walking with increased shuffling feet especially in crowded environments over last 2 years. She reports approximately 3 falls in last 6 months. She reports when she was leaning over she just kept going and fell forward; PMH significant for neuropathy with numbness in plantar surface of feet to toes (over last year); She has also been diagnosed with Parkinson's Disease although some physicians say that she doesn't have it. She has been taking Sinemet with no change in symptoms (4 months); She is following up with neurologist in 3 months;    Limitations  Standing;Walking    How long can you sit comfortably?  NA    How long can you stand  comfortably?  Patient often leans forward on counter/buggy with prolonged standing due to fatigue; denies any back pain;    How long can you walk comfortably?  about 200-300 feet; has difficulty changing surfaces/narrow environments    Diagnostic tests  MRI in June 2020- no acute abnormality, mild-mod chronic small vessel disease noted;    Patient Stated Goals  be able to walk better; improve speed/distance; reduce fall risk;    Currently in Pain?  No/denies    Pain Score  0-No pain    Pain Onset  In the past 7 days       Neuromuscular Re-education  Rocker board fwd/bwd, side to side x 20 each direction Tandem gait on 2"x4" without UE support x 2 lengths Side stepping on 2"x4" without UE support x 2 lengths Heel/toe raises without UE support 3s hold x 10 each 1/2 foam roll balance with flat side up 30s x 2 reps 1/2 foam roll balance with flat side down 30s x 2 reps 1/2 foam roll tandem balance alternating forward LE 30s x 2 each LE forward Lateral side steps from foam to 6 inch stool left and right x 15 Backwards stepping from foam to 6 inch stool x 15  [place] fwd/bwd, side to side , diagonal x 10 ,cues for posture  and stepping strategies, occasional LOB Star stepping left and right x 10       Pt educated throughout session about proper posture and technique with exercises. Improved exercise technique, movement at target joints, use of target muscles after min to mod verbal, visual, tactile cues. CGA and Min to mod verbal cues used throughout with increased in postural sway and LOB most seen with narrow base of support and while on uneven surfaces. Continues to have balance deficits typical with diagnosis. Patient performs intermediate level exercises without pain behaviors and needs verbal cuing for postural alignment and head positioning Tactile cues and assistance needed to keep lower leg and knee in neutral to avoid compensations with ankle  motions.                         PT Education - 10/27/19 1009    Education Details  HEP    Person(s) Educated  Patient    Methods  Explanation    Comprehension  Verbalized understanding;Returned demonstration;Need further instruction       PT Short Term Goals - 10/08/19 1108      PT SHORT TERM GOAL #1   Title  Patient will be adherent to HEP at least 3x a week to improve functional strength and balance for better safety at home.    Time  4    Period  Weeks    Status  Achieved    Target Date  09/09/19      PT SHORT TERM GOAL #2   Title  Patient will increase BLE gross strength to 4+/5 as to improve functional strength for independent gait, increased standing tolerance and increased ADL ability.    Time  4    Period  Weeks    Status  Partially Met    Target Date  11/05/19        PT Long Term Goals - 10/08/19 1108      PT LONG TERM GOAL #1   Title  Patient will increase Functional Gait Assessment score to >20/30 as to reduce fall risk and improve dynamic gait safety with community ambulation.    Baseline  09/24/2019: 20/30, continues to be a falls risk;    Time  4    Period  Weeks    Status  Partially Met    Target Date  11/05/19      PT LONG TERM GOAL #2   Title  Patient will increase 10 meter walk test to >1.61ms as to improve gait speed for better community ambulation and to reduce fall risk.    Baseline  09/24/2019: patient selected gait speed 0.91 m/s, fastest 1.1 m/s;    Time  4    Period  Weeks    Status  Partially Met    Target Date  11/05/19      PT LONG TERM GOAL #3   Title  Patient will reduce timed up and go to <11 seconds to reduce fall risk and demonstrate improved transfer/gait ability.    Baseline  09/24/2019: 10.32 sec    Time  8    Period  Weeks    Status  Achieved      PT LONG TERM GOAL #4   Title  Patient will ascend/descend 4 stairs with 1 rail assist independently without loss of balance to improve ability to get in/out  of home.    Baseline  09/24/2019: ascends with 0 UE support, descends with 2 UE support;  Time  4    Period  Weeks    Status  Partially Met    Target Date  11/05/19            Plan - 10/27/19 1010    Clinical Impression Statement  Patient demonstrating increased coordination of muscle activation with smoother movements during strengthening interventions in close chained and dynamic balance interventions.  Decreased fatigue at end of set implicates increased strength additionally. Patient continues to be challenged by dynamic surfaces and single limb stance. Patient will continue to benefit from skilled physical therapy to improve gait mechanics, strength, and balance for return to PLOF   Personal Factors and Comorbidities  Age;Comorbidity 3+;Time since onset of injury/illness/exacerbation;Transportation    Comorbidities  Parkinson's, diabetes, depression, arthritis    Examination-Activity Limitations  Locomotion Level;Stairs;Stand;Transfers    Examination-Participation Restrictions  Cleaning;Community Activity;Driving;Shop;Volunteer;Yard Work    [employer] officer  Evolving/Moderate complexity    Rehab Potential  Fair    PT Frequency  2x / week    PT Duration  4 weeks    PT Treatment/Interventions  Moist Heat;Cryotherapy;Gait training;Stair training;Functional mobility training;Therapeutic activities;Therapeutic exercise;Balance training;Neuromuscular re-education;Patient/family education;Passive range of motion;Energy conservation    PT Next Visit Plan  work on balance/HEP; freezing and FOG episode cueing, cognitive multitasking    PT Home Exercise Plan  initiated    Consulted and Agree with Plan of Care  Patient       Patient will benefit from skilled therapeutic intervention in order to improve the following deficits and impairments:  Abnormal gait, Decreased balance, Decreased endurance, Decreased mobility, Difficulty walking, Decreased strength, Decreased safety  awareness, Decreased activity tolerance  Visit Diagnosis: Unsteadiness on feet  Muscle weakness (generalized)     Problem List Patient Active Problem List   Diagnosis Date Noted  . Vitamin D deficiency 07/20/2019  . 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[place]) 07/20/2019  . At high risk for falls 07/20/2019  . Mass on back 01/21/2019  . Fatigue 01/15/2019  . Parkinson disease ([place]) 01/15/2019  . Hyperlipidemia 01/15/2019  . Diabetes mellitus type 2 in obese ([place]) 01/13/2019  . Goiter 01/13/2019  . Overactive bladder 01/13/2019  . Obesity (BMI 30.0-34.9) 01/13/2019  . Medication monitoring encounter 01/13/2019  . Gait abnormality 01/13/2019    Alanson Puls, PT DPT 10/27/2019, 10:12 AM  [place] MAIN [facility] SERVICES [address] [place], [place], [address] Phone: [phone]   Fax:  [phone]  Name: Whitney [name] MRN: [id number] Date of Birth: 1941-03-11

## 2019-10-29 ENCOUNTER — Ambulatory Visit: Payer: Medicare Other | Admitting: Physical Therapy

## 2019-10-29 ENCOUNTER — Encounter: Payer: Self-pay | Admitting: Physical Therapy

## 2019-10-29 ENCOUNTER — Other Ambulatory Visit: Payer: Self-pay

## 2019-10-29 DIAGNOSIS — R2681 Unsteadiness on feet: Secondary | ICD-10-CM | POA: Diagnosis not present

## 2019-10-29 DIAGNOSIS — M6281 Muscle weakness (generalized): Secondary | ICD-10-CM

## 2019-10-29 NOTE — Therapy (Signed)
[place] MAIN [facility] SERVICES [address] [place], [place], [address] Phone: [phone]   Fax:  [phone]  Physical Therapy Treatment Physical Therapy Progress Note Discharge summary   Dates of reporting period  09/10/19   to   10/29/19 Patient Details  Name: Whitney [name] MRN: [id number] Date of Birth: 1941-08-01 Referring Provider (PT): Delsa Grana PA   Encounter Date: 10/29/2019  PT End of Session - 10/29/19 1013    Visit Number  20    Number of Visits  25    Date for PT Re-Evaluation  11/05/19    PT Start Time  0175    PT Stop Time  1055    PT Time Calculation (min)  40 min    Equipment Utilized During Treatment  Gait belt    Activity Tolerance  Patient tolerated treatment well;No increased pain    Behavior During Therapy  WFL for tasks assessed/performed       Past Medical History:  Diagnosis Date  . Arthritis   . Depression   . Diabetes mellitus without complication ([place])   . Goiter   . Overactive bladder   . Parkinson's disease ([place])   . Unsteady gait     Past Surgical History:  Procedure Laterality Date  . ABDOMINAL HYSTERECTOMY    . BLADDER REPAIR    . NECK SURGERY      There were no vitals filed for this visit.  Subjective Assessment - 10/29/19 1012    Subjective  Patient reports no pain and no new concerns.    Pertinent History  78 yo Female reports having difficulty walking with increased shuffling feet especially in crowded environments over last 2 years. She reports approximately 3 falls in last 6 months. She reports when she was leaning over she just kept going and fell forward; PMH significant for neuropathy with numbness in plantar surface of feet to toes (over last year); She has also been diagnosed with Parkinson's Disease although some physicians say that she doesn't have it. She has been taking Sinemet with no change in symptoms (4 months); She is following up with neurologist in 3 months;    Limitations  Standing;Walking    How long can you sit comfortably?  NA    How long can you stand comfortably?  Patient often leans forward on counter/buggy with prolonged standing due to fatigue; denies any back pain;    How long can you walk comfortably?  about 200-300 feet; has difficulty changing surfaces/narrow environments    Diagnostic tests  MRI in June 2020- no acute abnormality, mild-mod chronic small vessel disease noted;    Patient Stated Goals  be able to walk better; improve speed/distance; reduce fall risk;    Pain Onset  In the past 7 days       Treatment: Therapeutic activities: Patient performs DGI, 10 MW, TUG and, ascending / descending steps with  goals addressed    Neuromuscular Re-education  Rocker board fwd/bwd, side to side x 20 each direction Tandem gait on 2"x4" without UE support x 2 lengths Side stepping on 2"x4" without UE support x 2 lengths Heel/toe raises without UE support 3s hold x 10 each 1/2 foam roll balance with flat side up 30s x 2 reps 1/2 foam roll balance with flat side down 30s x 2 reps 1/2 foam roll tandem balance alternating forward LE 30s x 2 each LE forward  Patient performed with instruction, verbal cues, tactile cues of therapist: goal: increase tissue extensibility,  promote proper posture, improve mobility     Pt educated throughout session about proper posture and technique with exercises. Improved exercise technique, movement at target joints, use of target muscles after min to mod verbal, visual, tactile cues. CGA and Min to mod verbal cues used throughout with increased in postural sway and LOB most seen with narrow base of support and while on uneven surfaces. Continues to have balance deficits typical with diagnosis. Patient performs intermediate level exercises without pain behaviors and needs verbal cuing for postural alignment and head positioning                       PT Education - 10/29/19 1012    Education  Details  HEP    Person(s) Educated  Patient    Methods  Explanation    Comprehension  Verbalized understanding;Returned demonstration;Need further instruction;Tactile cues required;Verbal cues required       PT Short Term Goals - 10/08/19 1108      PT SHORT TERM GOAL #1   Title  Patient will be adherent to HEP at least 3x a week to improve functional strength and balance for better safety at home.    Time  4    Period  Weeks    Status  Achieved    Target Date  09/09/19      PT SHORT TERM GOAL #2   Title  Patient will increase BLE gross strength to 4+/5 as to improve functional strength for independent gait, increased standing tolerance and increased ADL ability.    Time  4    Period  Weeks    Status  Partially Met    Target Date  11/05/19        PT Long Term Goals - 10/08/19 1108      PT LONG TERM GOAL #1   Title  Patient will increase Functional Gait Assessment score to >20/30 as to reduce fall risk and improve dynamic gait safety with community ambulation.    Baseline  09/24/2019: 20/30, continues to be a falls risk;    Time  4    Period  Weeks    Status  Partially Met    Target Date  11/05/19      PT LONG TERM GOAL #2   Title  Patient will increase 10 meter walk test to >1.60ms as to improve gait speed for better community ambulation and to reduce fall risk.    Baseline  09/24/2019: patient selected gait speed 0.91 m/s, fastest 1.1 m/s;    Time  4    Period  Weeks    Status  Partially Met    Target Date  11/05/19      PT LONG TERM GOAL #3   Title  Patient will reduce timed up and go to <11 seconds to reduce fall risk and demonstrate improved transfer/gait ability.    Baseline  09/24/2019: 10.32 sec    Time  8    Period  Weeks    Status  Achieved      PT LONG TERM GOAL #4   Title  Patient will ascend/descend 4 stairs with 1 rail assist independently without loss of balance to improve ability to get in/out of home.    Baseline  09/24/2019: ascends with 0 UE  support, descends with 2 UE support;    Time  4    Period  Weeks    Status  Partially Met    Target Date  11/05/19  Plan - 10/29/19 1013    Clinical Impression Statement  Patient's condition has  improvde in response to therapy. Goals have been be obtained.   Patient reports feeling more at ease and balanced with her walking.Pt demonstrates decreased postural sway when standing on uneven surface.Reviewed HEP.  Patient will continue to benefit from skilled PT for improved balance and strength She has reached her goals and will be DC form skilled PT .    Personal Factors and Comorbidities  Age;Comorbidity 3+;Time since onset of injury/illness/exacerbation;Transportation    Comorbidities  Parkinson's, diabetes, depression, arthritis    Examination-Activity Limitations  Locomotion Level;Stairs;Stand;Transfers    Examination-Participation Restrictions  Cleaning;Community Activity;Driving;Shop;Volunteer;Yard Work    [employer]  Evolving/Moderate complexity    Rehab Potential  Fair    PT Frequency  2x / week    PT Duration  4 weeks    PT Treatment/Interventions  Moist Heat;Cryotherapy;Gait training;Stair training;Functional mobility training;Therapeutic activities;Therapeutic exercise;Balance training;Neuromuscular re-education;Patient/family education;Passive range of motion;Energy conservation    PT Next Visit Plan  work on balance/HEP; freezing and FOG episode cueing, cognitive multitasking    PT Home Exercise Plan  initiated    Consulted and Agree with Plan of Care  Patient       Patient will benefit from skilled therapeutic intervention in order to improve the following deficits and impairments:  Abnormal gait, Decreased balance, Decreased endurance, Decreased mobility, Difficulty walking, Decreased strength, Decreased safety awareness, Decreased activity tolerance  Visit Diagnosis: Unsteadiness on feet  Muscle weakness (generalized)     Problem  List Patient Active Problem List   Diagnosis Date Noted  . Vitamin D deficiency 07/20/2019  . 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 At high risk for falls 07/20/2019  . Mass on back 01/21/2019  . Fatigue 01/15/2019  . Parkinson disease ([place]) 01/15/2019  . Hyperlipidemia 01/15/2019  . Diabetes mellitus type 2 in obese ([place]) 01/13/2019  . Goiter 01/13/2019  . Overactive bladder 01/13/2019  . Obesity (BMI 30.0-34.9) 01/13/2019  . Medication monitoring encounter 01/13/2019  . Gait abnormality 01/13/2019    Alanson Puls, PT DPT 10/29/2019, 10:14 AM  [place] MAIN [facility] SERVICES [address] [place], [place], [address] Phone: [phone]   Fax:  [phone]  Name: Whitney [name] MRN: [id number] Date of Birth: November 16, 1941

## 2019-11-03 ENCOUNTER — Ambulatory Visit: Payer: Medicare Other | Admitting: Physical Therapy

## 2019-11-08 ENCOUNTER — Other Ambulatory Visit: Payer: Self-pay | Admitting: Family Medicine

## 2019-11-10 ENCOUNTER — Encounter: Payer: Medicare Other | Admitting: Physical Therapy

## 2019-11-12 ENCOUNTER — Ambulatory Visit: Payer: Medicare Other | Admitting: Physical Therapy

## 2019-11-16 ENCOUNTER — Ambulatory Visit: Payer: Medicare Other | Admitting: Physical Therapy

## 2019-11-17 ENCOUNTER — Emergency Department: Payer: Medicare Other

## 2019-11-17 ENCOUNTER — Emergency Department
Admission: EM | Admit: 2019-11-17 | Discharge: 2019-11-18 | Disposition: A | Discharge status: Home / Self Care | Payer: Medicare Other | Attending: Emergency Medicine | Admitting: Emergency Medicine

## 2019-11-17 DIAGNOSIS — E119 Type 2 diabetes mellitus without complications: Secondary | ICD-10-CM | POA: Diagnosis not present

## 2019-11-17 DIAGNOSIS — Z7984 Long term (current) use of oral hypoglycemic drugs: Secondary | ICD-10-CM | POA: Diagnosis not present

## 2019-11-17 DIAGNOSIS — S42201A Unspecified fracture of upper end of right humerus, initial encounter for closed fracture: Secondary | ICD-10-CM | POA: Insufficient documentation

## 2019-11-17 DIAGNOSIS — Z79899 Other long term (current) drug therapy: Secondary | ICD-10-CM | POA: Insufficient documentation

## 2019-11-17 DIAGNOSIS — Z7982 Long term (current) use of aspirin: Secondary | ICD-10-CM | POA: Diagnosis not present

## 2019-11-17 DIAGNOSIS — Y92009 Unspecified place in unspecified non-institutional (private) residence as the place of occurrence of the external cause: Secondary | ICD-10-CM | POA: Insufficient documentation

## 2019-11-17 DIAGNOSIS — Y939 Activity, unspecified: Secondary | ICD-10-CM | POA: Diagnosis not present

## 2019-11-17 DIAGNOSIS — W19XXXA Unspecified fall, initial encounter: Secondary | ICD-10-CM | POA: Diagnosis not present

## 2019-11-17 DIAGNOSIS — M25511 Pain in right shoulder: Secondary | ICD-10-CM

## 2019-11-17 DIAGNOSIS — S4991XA Unspecified injury of right shoulder and upper arm, initial encounter: Secondary | ICD-10-CM | POA: Diagnosis present

## 2019-11-17 DIAGNOSIS — Y999 Unspecified external cause status: Secondary | ICD-10-CM | POA: Insufficient documentation

## 2019-11-17 DIAGNOSIS — Z87891 Personal history of nicotine dependence: Secondary | ICD-10-CM | POA: Diagnosis not present

## 2019-11-17 LAB — CBC WITH DIFFERENTIAL/PLATELET
Abs Immature Granulocytes: 0.03 10*3/uL (ref 0.00–0.07)
Basophils Absolute: 0 10*3/uL (ref 0.0–0.1)
Basophils Relative: 0 %
Eosinophils Absolute: 0 10*3/uL (ref 0.0–0.5)
Eosinophils Relative: 0 %
HCT: 36.2 % (ref 36.0–46.0)
Hemoglobin: 11.6 g/dL — ABNORMAL LOW (ref 12.0–15.0)
Immature Granulocytes: 0 %
Lymphocytes Relative: 6 %
Lymphs Abs: 0.5 10*3/uL — ABNORMAL LOW (ref 0.7–4.0)
MCH: 27.3 pg (ref 26.0–34.0)
MCHC: 32 g/dL (ref 30.0–36.0)
MCV: 85.2 fL (ref 80.0–100.0)
Monocytes Absolute: 0.4 10*3/uL (ref 0.1–1.0)
Monocytes Relative: 4 %
Neutro Abs: 8.3 10*3/uL — ABNORMAL HIGH (ref 1.7–7.7)
Neutrophils Relative %: 90 %
Platelets: 155 10*3/uL (ref 150–400)
RBC: 4.25 MIL/uL (ref 3.87–5.11)
RDW: 14.9 % (ref 11.5–15.5)
WBC: 9.3 10*3/uL (ref 4.0–10.5)
nRBC: 0 % (ref 0.0–0.2)

## 2019-11-17 MED ORDER — FENTANYL CITRATE (PF) 100 MCG/2ML IJ SOLN
25.0000 ug | Freq: Once | INTRAMUSCULAR | Status: AC
  Administered 2019-11-17: 25 ug via INTRAVENOUS
  Filled 2019-11-17: qty 2

## 2019-11-17 MED ORDER — ONDANSETRON HCL 4 MG/2ML IJ SOLN
4.0000 mg | Freq: Once | INTRAMUSCULAR | Status: AC
  Administered 2019-11-17: 4 mg via INTRAVENOUS
  Filled 2019-11-17: qty 2

## 2019-11-17 MED ORDER — SODIUM CHLORIDE 0.9 % IV BOLUS
1000.0000 mL | Freq: Once | INTRAVENOUS | Status: AC
  Administered 2019-11-17: 1000 mL via INTRAVENOUS

## 2019-11-17 NOTE — ED Provider Notes (Signed)
[facility] Emergency Department Provider Note   ____________________________________________   First MD Initiated Contact with Patient 11/17/19 2331     (approximate)  I have reviewed the triage vital signs and the nursing notes.   HISTORY  Chief Complaint Fall, right shoulder pain   HPI Whitney [name] is a 78 y.o. female brought to the ED from home via EMS status post fall with right shoulder pain.  Around 6 PM, patient was leaning forward to pick something up from the floor when she lost her balance and fell, striking her right shoulder.  Denies striking head or LOC.  She was unable to get to a telephone until prior to arrival so has been on her floor for approximately 5 hours.  Denies vision changes, neck pain, headache, chest pain, shortness of breath, abdominal pain, nausea, vomiting or dizziness.  Denies anticoagulant use.       Past Medical History:  Diagnosis Date   Arthritis    Depression    Diabetes mellitus without complication ([facility])    Goiter    Overactive bladder    Parkinson's disease ([facility])    Unsteady gait     Patient Active Problem List   Diagnosis Date Noted   Vitamin D deficiency 07/20/2019   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[facility]) 07/20/2019   At high risk for falls 07/20/2019   Mass on back 01/21/2019   Fatigue 01/15/2019   Parkinson disease ([facility]) 01/15/2019   Hyperlipidemia 01/15/2019   Diabetes mellitus type 2 in obese ([facility]) 01/13/2019   Goiter 01/13/2019   Overactive bladder 01/13/2019   Obesity (BMI 30.0-34.9) 01/13/2019   Medication monitoring encounter 01/13/2019   Gait abnormality 01/13/2019    Past Surgical History:  Procedure Laterality Date   ABDOMINAL HYSTERECTOMY     BLADDER REPAIR     NECK SURGERY      Prior to Admission medications   Medication Sig Start Date End Date Taking? Authorizing Provider  aspirin EC 81 MG tablet Take 1 tablet (81 mg total) by mouth  daily. (take at least one hour prior to any ibuprofen) 02/10/19   Lada, Janit Bern, MD  atorvastatin (LIPITOR) 40 MG tablet Take 1 tablet (40 mg total) by mouth at bedtime. 10/12/19   Danelle Berry, PA-C  buPROPion (WELLBUTRIN XL) 150 MG 24 hr tablet TAKE 1 TABLET(150 MG) BY MOUTH DAILY 10/12/19   Danelle Berry, PA-C  carbidopa-levodopa (SINEMET IR) 25-100 MG tablet Take 1.5 pills three times a day for one week then increase to 2 pills three times a day 05/21/19   [provider]  diphenhydrAMINE (BENADRYL) 25 MG tablet Take 25 mg by mouth as needed.     [provider]  metFORMIN (GLUCOPHAGE-XR) 500 MG 24 hr tablet TAKE 1 TABLET(500 MG) BY MOUTH TWICE DAILY 11/09/19   Danelle Berry, PA-C  mirabegron ER (MYRBETRIQ) 50 MG TB24 tablet Take 1 tablet (50 mg total) by mouth daily. 04/22/19   Jerilee Field, MD  oxyCODONE-acetaminophen (PERCOCET/ROXICET) 5-325 MG tablet Take 1 tablet by mouth every 4 (four) hours as needed for severe pain. 11/18/19   Irean Hong, MD  solifenacin (VESICARE) 5 MG tablet Take 1 tablet (5 mg total) by mouth daily. 08/19/19   Jerilee Field, MD  vitamin B-12 (CYANOCOBALAMIN) 100 MCG tablet Take 100 mcg by mouth daily.    [provider]  Vitamin D, Ergocalciferol, (DRISDOL) 1.25 MG (50000 UT) CAPS capsule Take by mouth. 04/16/19   [provider]  Allergies Patient has no known allergies.  Family History  Problem Relation Age of Onset   Stroke Mother    Hypertension Mother    Diabetes Father    Heart disease Father    Lung cancer Father    Colon cancer Sister    Diabetes Sister    Kidney disease Daughter    Kidney cancer Daughter    Pulmonary embolism Sister    Diabetes Sister    Kidney disease Sister    Heart disease Sister    Atrial fibrillation Sister    Diabetes Brother    Heart disease Brother    AAA (abdominal aortic aneurysm) Brother    Cancer Brother        skin    Social History Social History    Tobacco Use   Smoking status: Former Smoker    Packs/day: 2.00    Years: 40.00    Pack years: 80.00    Types: Cigarettes    Quit date: 12/10/2018    Years since quitting: 0.9   Smokeless tobacco: Never Used  Substance Use Topics   Alcohol use: Never    Frequency: Never   Drug use: Never    Review of Systems  Constitutional: No fever/chills Eyes: No visual changes. ENT: No sore throat. Cardiovascular: Denies chest pain. Respiratory: Denies shortness of breath. Gastrointestinal: No abdominal pain.  No nausea, no vomiting.  No diarrhea.  No constipation. Genitourinary: Negative for dysuria. Musculoskeletal: Positive for right shoulder pain.  Negative for back pain. Skin: Negative for rash. Neurological: Negative for headaches, focal weakness or numbness.   ____________________________________________   PHYSICAL EXAM:  VITAL SIGNS: ED Triage Vitals  Enc Vitals Group     BP      Pulse      Resp      Temp      Temp src      SpO2      Weight      Height      Head Circumference      Peak Flow      Pain Score      Pain Loc      Pain Edu?      Excl. in GC?     Constitutional: Alert and oriented.  Uncomfortable appearing and in mild acute distress. Eyes: Conjunctivae are normal. PERRL. EOMI. Head: Atraumatic. Nose: Atraumatic. Mouth/Throat: Mucous membranes are moist.  No dental malocclusion. Neck: No stridor.  No cervical spine tenderness to palpation. Cardiovascular: Normal rate, regular rhythm. Grossly normal heart sounds.  Good peripheral circulation. Respiratory: Normal respiratory effort.  No retractions. Lungs CTAB. Gastrointestinal: Soft and nontender to light and deep palpation. No distention. No abdominal bruits. No CVA tenderness. Musculoskeletal: No spinal tenderness to palpation.  Pelvis stable.   Right shoulder swollen with ecchymosis.  Held in abduction and internal rotation.  Limited range of motion secondary to pain.  2+ radial pulse.  Brisk,  less than 5-second capillary refill.  All 3 rings removed from right fingers. No lower extremity tenderness nor edema.  No joint effusions. Neurologic: Alert and oriented x3.  CN II-XII grossly intact.  Normal speech and language. No gross focal neurologic deficits are appreciated.  Skin:  Skin is warm, dry and intact. No rash noted. Psychiatric: Mood and affect are normal. Speech and behavior are normal.  ____________________________________________   LABS (all labs ordered are listed, but only abnormal results are displayed)  Labs Reviewed  CBC WITH DIFFERENTIAL/PLATELET - Abnormal; Notable for the following components:  Result Value   Hemoglobin 11.6 (*)    Neutro Abs 8.3 (*)    Lymphs Abs 0.5 (*)    All other components within normal limits  COMPREHENSIVE METABOLIC PANEL - Abnormal; Notable for the following components:   Sodium 134 (*)    Glucose, Bld 204 (*)    Calcium 8.7 (*)    Total Bilirubin 1.5 (*)    All other components within normal limits  CK  URINALYSIS, COMPLETE (UACMP) WITH MICROSCOPIC  TROPONIN I (HIGH SENSITIVITY)  TROPONIN I (HIGH SENSITIVITY)   ____________________________________________  EKG  ED ECG REPORT I, Hosie Sharman J, the attending physician, personally viewed and interpreted this ECG.   Date: 11/17/2019  EKG Time: 2349  Rate: 92  Rhythm: normal EKG, normal sinus rhythm  Axis: Normal  Intervals:none  ST&T Change: Nonspecific  ____________________________________________  RADIOLOGY  ED MD interpretation: Remarkable chest and pelvis; right proximal humerus fracture  Official radiology report(s): Dg Pelvis 1-2 Views  Result Date: 11/18/2019 CLINICAL DATA:  Post fall with right shoulder pain. EXAM: PELVIS - 1-2 VIEW COMPARISON:  None. FINDINGS: The cortical margins of the bony pelvis are intact. No fracture. Pubic symphysis and sacroiliac joints are congruent. Both femoral heads are well-seated in the respective acetabula. IMPRESSION:  No evidence of pelvic fracture. Electronically Signed   By: Narda Rutherford M.D.   On: 11/18/2019 00:35   Dg Shoulder Right  Result Date: 11/18/2019 CLINICAL DATA:  Right shoulder pain after fall. EXAM: RIGHT SHOULDER - 2+ VIEW COMPARISON:  None. FINDINGS: Comminuted and displaced proximal humerus fracture. Dominant fractures through the surgical neck with displacement of the greater and lesser tuberosities. Fracture displacement of greater than 1 cm. No evidence of dislocation, humeral head remains seated. No obvious intra-articular extension of fracture lines on radiography. Acromioclavicular joint is congruent. There is soft tissue edema laterally. IMPRESSION: Comminuted and displaced proximal humerus fracture. Fracture involves the greater and lesser tuberosities with displacement of greater than 1 cm. Electronically Signed   By: Narda Rutherford M.D.   On: 11/18/2019 00:34   Dg Chest Port 1 View  Result Date: 11/18/2019 CLINICAL DATA:  Post fall with right shoulder pain. EXAM: PORTABLE CHEST 1 VIEW COMPARISON:  Chest radiograph 11/21/2018 FINDINGS: Right proximal humerus fracture better assessed on concurrent shoulder exam. Unchanged heart size and mediastinal contours. There is stable rightward deviation of the trachea with left paratracheal soft tissue fullness likely related to enlarged left lobe of the thyroid gland, thyroid ultrasound performed 02/03/2019. Heart is normal in size. No acute airspace disease, pulmonary edema, or pneumothorax. No visualized rib fracture. Surgical hardware in the lower cervical spine is partially included. IMPRESSION: 1. Right proximal humerus fracture, better assessed on concurrent shoulder exam. 2. No other acute finding in the chest. Electronically Signed   By: Narda Rutherford M.D.   On: 11/18/2019 00:37    ____________________________________________   PROCEDURES  Procedure(s) performed (including Critical  Care):  Procedures   ____________________________________________   INITIAL IMPRESSION / ASSESSMENT AND PLAN / ED COURSE  As part of my medical decision making, I reviewed the following data within the electronic MEDICAL RECORD NUMBER Nursing notes reviewed and incorporated, Labs reviewed, EKG interpreted, Old chart reviewed, Radiograph reviewed, Notes from prior ED visits and [place] Controlled Substance Database     Whitney [name] was evaluated in Emergency Department on 11/18/2019 for the symptoms described in the history of present illness. She was evaluated in the context of the global COVID-19 pandemic, which necessitated consideration that the patient  might be at risk for infection with the SARS-CoV-2 virus that causes COVID-19. Institutional protocols and algorithms that pertain to the evaluation of patients at risk for COVID-19 are in a state of rapid change based on information released by regulatory bodies including the CDC and federal and state organizations. These policies and algorithms were followed during the patient's care in the ED.    78 year old female who presents with right shoulder pain status post mechanical fall with prolonged downtime.  Differential diagnosis includes but is not limited to fracture, dislocation, musculoskeletal contusion, rhabdomyolysis, AKI secondary to rhabdomyolysis, etc.  We will obtain basic lab work including CK.  X-ray imaging of right shoulder, chest and pelvis.  Initiate IV fluid resuscitation, IV fentanyl for pain.   Clinical Course as of Nov 18 503  Wed Nov 18, 2019  0104 Patient resting, visiting with her sister.  Updated patient of all test results.  Will administer oral analgesia, placed in shoulder sling.  Patient lives alone but states she will be able to rely on her sister for assistance.  Almost 1 year ago patient fractured her left humerus and has been seeing Dr. Rudene Christians from orthopedics.  She will follow-up with him in several days time.   Strict return precautions given.  Both verbalized understanding and agree with plan of care.   [JS]    Clinical Course User Index [JS] Paulette Blanch, MD     ____________________________________________   FINAL CLINICAL IMPRESSION(S) / ED DIAGNOSES  Final diagnoses:  Fall, initial encounter  Acute pain of right shoulder  Closed fracture of proximal end of right humerus, unspecified fracture morphology, initial encounter     ED Discharge Orders         Ordered    oxyCODONE-acetaminophen (PERCOCET/ROXICET) 5-325 MG tablet  Every 4 hours PRN     11/18/19 0106           Note:  This document was prepared using Dragon voice recognition software and may include unintentional dictation errors.   Paulette Blanch, MD 11/18/19 [phone]

## 2019-11-17 NOTE — ED Triage Notes (Signed)
Pt arrived via EMS from home where she tripped and fell onto floor at 1800 on 12/7 and crawled to phone and called this AM for help. Pt denies hitting head, loc. Pt has obvious bruising and swelling to the right shoulder. Pt unable to move shoulder/arm without pain. MD at bedside.

## 2019-11-18 ENCOUNTER — Ambulatory Visit: Payer: Medicare Other | Admitting: Family Medicine

## 2019-11-18 ENCOUNTER — Encounter: Payer: Self-pay | Admitting: Emergency Medicine

## 2019-11-18 DIAGNOSIS — S42201A Unspecified fracture of upper end of right humerus, initial encounter for closed fracture: Secondary | ICD-10-CM | POA: Diagnosis not present

## 2019-11-18 LAB — COMPREHENSIVE METABOLIC PANEL
ALT: 22 U/L (ref 0–44)
AST: 36 U/L (ref 15–41)
Albumin: 4 g/dL (ref 3.5–5.0)
Alkaline Phosphatase: 118 U/L (ref 38–126)
Anion gap: 11 (ref 5–15)
BUN: 13 mg/dL (ref 8–23)
CO2: 23 mmol/L (ref 22–32)
Calcium: 8.7 mg/dL — ABNORMAL LOW (ref 8.9–10.3)
Chloride: 100 mmol/L (ref 98–111)
Creatinine, Ser: 0.51 mg/dL (ref 0.44–1.00)
GFR calc Af Amer: >60 mL/min (ref >60)
GFR calc non Af Amer: >60 mL/min (ref >60)
Glucose, Bld: 204 mg/dL — ABNORMAL HIGH (ref 70–99)
Potassium: 4.6 mmol/L (ref 3.5–5.1)
Sodium: 134 mmol/L — ABNORMAL LOW (ref 135–145)
Total Bilirubin: 1.5 mg/dL — ABNORMAL HIGH (ref 0.3–1.2)
Total Protein: 7.3 g/dL (ref 6.5–8.1)

## 2019-11-18 LAB — TROPONIN I (HIGH SENSITIVITY): Troponin I (High Sensitivity): 7 ng/L (ref <18)

## 2019-11-18 LAB — CK: Total CK: 104 U/L (ref 38–234)

## 2019-11-18 MED ORDER — OXYCODONE-ACETAMINOPHEN 5-325 MG PO TABS
1.0000 | ORAL_TABLET | Freq: Once | ORAL | Status: AC
  Administered 2019-11-18: 1 via ORAL
  Filled 2019-11-18: qty 1

## 2019-11-18 MED ORDER — OXYCODONE-ACETAMINOPHEN 5-325 MG PO TABS: 1.0000 | ORAL_TABLET | ORAL | 0 refills | Status: DC | PRN

## 2019-11-18 NOTE — Discharge Instructions (Signed)
1.  You may take Percocet as needed for pain. 2.  Wear sling while awake. 3.  Return to the ER for worsening symptoms, persistent vomiting, difficulty breathing or other concerns.

## 2019-11-19 ENCOUNTER — Telehealth: Payer: Self-pay | Admitting: Family Medicine

## 2019-11-19 ENCOUNTER — Ambulatory Visit: Payer: Medicare Other | Admitting: Family Medicine

## 2019-11-19 NOTE — Telephone Encounter (Signed)
Pt sister nancy would like a referral for her sister to have home health assistant. Pt had a fell on on 11-17-2019 and ball in right arm is broken. Pt has a virtual appt on 11-24-2019

## 2019-11-19 NOTE — Telephone Encounter (Signed)
This will be addressed at her visit.  We can not place a referral per ins until she is seen

## 2019-11-24 ENCOUNTER — Ambulatory Visit (INDEPENDENT_AMBULATORY_CARE_PROVIDER_SITE_OTHER): Payer: Medicare Other | Admitting: Family Medicine

## 2019-11-24 ENCOUNTER — Encounter: Payer: Self-pay | Admitting: Family Medicine

## 2019-11-24 ENCOUNTER — Encounter

## 2019-11-24 VITALS — Ht 62.0 in | Wt 170.0 lb

## 2019-11-24 DIAGNOSIS — F320 Major depressive disorder, single episode, mild: Secondary | ICD-10-CM | POA: Diagnosis not present

## 2019-11-24 DIAGNOSIS — E782 Mixed hyperlipidemia: Secondary | ICD-10-CM | POA: Diagnosis not present

## 2019-11-24 DIAGNOSIS — E669 Obesity, unspecified: Secondary | ICD-10-CM

## 2019-11-24 DIAGNOSIS — E1169 Type 2 diabetes mellitus with other specified complication: Secondary | ICD-10-CM | POA: Diagnosis not present

## 2019-11-24 NOTE — Progress Notes (Signed)
Name: Whitney [name]   MRN: [id number]    DOB: July 16, 1941   Date:11/24/2019       Progress Note  Subjective:    Chief Complaint  Chief Complaint  Patient presents with  . Follow-up  . Depression  . Diabetes  . Hyperlipidemia  . Fall    fall last week broke right shoulder    I connected with  [name]  on 11/24/19 at  1:00 PM EST by a video enabled telemedicine application and verified that I am speaking with the correct person using two identifiers.  I discussed the limitations of evaluation and management by telemedicine and the availability of in person appointments. The patient expressed understanding and agreed to proceed. Staff also discussed with the patient that there may be a patient responsible charge related to this service. Patient Location: home Provider [facility] Additional Individuals present: pt's sister is present  HPI  MDD: Pt is taking wellbutrin 150 mg XR - moods okay, "its for my nerves"  Depression screen [facility] 2/9 11/24/2019 07/20/2019 05/26/2019  Decreased Interest 0 0 1  Down, Depressed, Hopeless 0 0 1  PHQ - 2 Score 0 0 2  Altered sleeping 0 0 1  Tired, decreased energy 0 1 3  Change in appetite 0 1 0  Feeling bad or failure about yourself  0 1 0  Trouble concentrating 0 1 0  Moving slowly or fidgety/restless 0 1 0  Suicidal thoughts 0 0 0  PHQ-9 Score 0 5 6  Difficult doing work/chores Not difficult at all Somewhat difficult Not difficult at all  PHQ negative, the 150 mg dose is still working for her w/o side effects.  A few months ago she was having a harder time and we attempted to increase her dose but she did not like the way it made her feel.  Diabetes Mellitus Type II: Currently managing with metformin 500 mg BID Pt notes good med compliance Pt has no SE from meds. She is not checking her blood sugars, her meter is not working well, she would like a new one and asks that we send in Rx to her pharmacy Denies: Polyuria,  polydipsia, polyphagia, vision changes, or neuropathy  Recent pertinent labs: Lab Results  Component Value Date   HGBA1C 7.3 (A) 07/20/2019   HGBA1C 6.7 (H) 01/13/2019   UTD on DM foot exam and eye exam ACEI/ARB: No Statin: Yes Pt just had a fall and broke her arm, we will have her come in in about a month or two to see how she is doing and update everything for DM - foot exam, microalbumin urine test  Hyperlipidemia: Current Medication Regimen:  atorvastatin 40 mg, compliant with meds, no SE or concerns, denies myalgias, CP SOB Last Lipids: Lab Results  Component Value Date   CHOL 128 07/22/2019   HDL 54 07/22/2019   LDLCALC 58 07/22/2019   TRIG 75 07/22/2019   CHOLHDL 2.4 07/22/2019   Pt recently had a fall and went to the ER, she had f/up earlier today with ortho and states she fell again and hurt her other shoulder.  Her sister is helping her.  Pt has been seeing neurology as well and they have increased her sinemet to 4x a day.   Patient Active Problem List   Diagnosis Date Noted  . Vitamin D deficiency 07/20/2019  . 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[place]) 07/20/2019  . At high risk for falls 07/20/2019  . Mass on back  01/21/2019  . Fatigue 01/15/2019  . Parkinson disease (HCC) 01/15/2019  . Hyperlipidemia 01/15/2019  . Diabetes mellitus type 2 in obese (HCC) 01/13/2019  . Goiter 01/13/2019  . Overactive bladder 01/13/2019  . Obesity (BMI 30.0-34.9) 01/13/2019  . Medication monitoring encounter 01/13/2019  . Gait abnormality 01/13/2019    Past Surgical History:  Procedure Laterality Date  . ABDOMINAL HYSTERECTOMY    . BLADDER REPAIR    . NECK SURGERY      Family History  Problem Relation Age of Onset  . Stroke Mother   . Hypertension Mother   . Diabetes Father   . Heart disease Father   . Lung cancer Father   . Colon cancer Sister   . Diabetes Sister   . Kidney disease Daughter   . Kidney cancer Daughter   . Pulmonary embolism  Sister   . Diabetes Sister   . Kidney disease Sister   . Heart disease Sister   . Atrial fibrillation Sister   . Diabetes Brother   . Heart disease Brother   . AAA (abdominal aortic aneurysm) Brother   . Cancer Brother        skin    Social History   Socioeconomic History  . Marital status: Widowed    Spouse name: Fayrene FearingJames  . Number of children: 2  . Years of education: 2812  . Highest education level: Some college, no degree  Occupational History  . Occupation: retired [employer]  Tobacco Use  . Smoking status: Former Smoker    Packs/day: 2.00    Years: 40.00    Pack years: 80.00    Types: Cigarettes    Quit date: 12/10/2018    Years since quitting: 0.9  . Smokeless tobacco: Never Used  Substance and Sexual Activity  . Alcohol use: Never  . Drug use: Never  . Sexual activity: Not Currently  Other Topics Concern  . Not on file  Social History Narrative   Lost her husband of 57 years in oct 2019. Moved back from [place] in Nov 2019   Social Determinants of Health   Financial Resource Strain: Low Risk   . Difficulty of Paying Living Expenses: Not hard at all  Food Insecurity: No Food Insecurity  . Worried About Programme researcher, broadcasting/film/videounning Out of Food in the Last Year: Never true  . Ran Out of Food in the Last Year: Never true  Transportation Needs: No Transportation Needs  . Lack of Transportation (Medical): No  . Lack of Transportation (Non-Medical): No  Physical Activity: Inactive  . Days of Exercise per Week: 0 days  . Minutes of Exercise per Session: 0 min  Stress: Stress Concern Present  . Feeling of Stress : To some extent  Social Connections: Moderately Isolated  . Frequency of Communication with Friends and Family: More than three times a week  . Frequency of Social Gatherings with Friends and Family: More than three times a week  . Attends Religious Services: Never  . Active Member of Clubs or Organizations: No  . Attends BankerClub or Organization Meetings: Never  . Marital  Status: Widowed  Intimate Partner Violence: Not At Risk  . Fear of Current or Ex-Partner: No  . Emotionally Abused: No  . Physically Abused: No  . Sexually Abused: No     Current Outpatient Medications:  .  aspirin EC 81 MG tablet, Take 1 tablet (81 mg total) by mouth daily. (take at least one hour prior to any ibuprofen), Disp: , Rfl:  .  atorvastatin (LIPITOR) 40 MG tablet, Take 1 tablet (40 mg total) by mouth at bedtime., Disp: 90 tablet, Rfl: 3 .  buPROPion (WELLBUTRIN XL) 150 MG 24 hr tablet, TAKE 1 TABLET(150 MG) BY MOUTH DAILY, Disp: 90 tablet, Rfl: 1 .  carbidopa-levodopa (SINEMET IR) 25-100 MG tablet, Take 1.5 pills three times a day for one week then increase to 2 pills three times a day, Disp: , Rfl:  .  metFORMIN (GLUCOPHAGE-XR) 500 MG 24 hr tablet, TAKE 1 TABLET(500 MG) BY MOUTH TWICE DAILY, Disp: 60 tablet, Rfl: 2 .  oxyCODONE-acetaminophen (PERCOCET/ROXICET) 5-325 MG tablet, Take 1 tablet by mouth every 4 (four) hours as needed for severe pain., Disp: 30 tablet, Rfl: 0 .  solifenacin (VESICARE) 5 MG tablet, Take 1 tablet (5 mg total) by mouth daily., Disp: 30 tablet, Rfl: 11 .  vitamin B-12 (CYANOCOBALAMIN) 100 MCG tablet, Take 100 mcg by mouth daily., Disp: , Rfl:  .  Vitamin D, Ergocalciferol, (DRISDOL) 1.25 MG (50000 UT) CAPS capsule, Take by mouth., Disp: , Rfl:  .  diphenhydrAMINE (BENADRYL) 25 MG tablet, Take 25 mg by mouth as needed. , Disp: , Rfl:  .  mirabegron ER (MYRBETRIQ) 50 MG TB24 tablet, Take 1 tablet (50 mg total) by mouth daily. (Patient not taking: Reported on 11/24/2019), Disp: 90 tablet, Rfl: 3  No Known Allergies  I personally reviewed active problem list, medication list, allergies, family history, social history, health maintenance, notes from last several encounters, lab results, imaging with the patient/caregiver today.  Review of Systems  Constitutional: Negative.   HENT: Negative.   Eyes: Negative.   Respiratory: Negative.   Cardiovascular:  Negative.   Gastrointestinal: Negative.   Endocrine: Negative.   Genitourinary: Negative.   Musculoskeletal: Negative.   Skin: Negative.   Allergic/Immunologic: Negative.   Neurological: Negative.   Hematological: Negative.   Psychiatric/Behavioral: Negative.   All other systems reviewed and are negative.     Objective:    Virtual encounter, vitals limited, only able to obtain the following Today's Vitals   11/24/19 1219  Weight: 170 lb (77.1 kg)  Height: 5\' 2"  (1.575 m)  PainSc: 8    Body mass index is 31.09 kg/m. Nursing Note and Vital Signs reviewed.  Physical Exam Voice clear, pt answering questions appropriately PE limited by telephone encounter  No results found for this or any previous visit (from the past 72 hour(s)).  PHQ2/9: Depression screen [facility] 2/9 11/24/2019 07/20/2019 05/26/2019 03/12/2019 02/10/2019  Decreased Interest 0 0 1 0 2  Down, Depressed, Hopeless 0 0 1 0 1  PHQ - 2 Score 0 0 2 0 3  Altered sleeping 0 0 1 0 1  Tired, decreased energy 0 1 3 0 1  Change in appetite 0 1 0 0 0  Feeling bad or failure about yourself  0 1 0 0 1  Trouble concentrating 0 1 0 0 0  Moving slowly or fidgety/restless 0 1 0 0 0  Suicidal thoughts 0 0 0 0 0  PHQ-9 Score 0 5 6 0 6  Difficult doing work/chores Not difficult at all Somewhat difficult Not difficult at all Not difficult at all Somewhat difficult   PHQ-2/9 Result is negative.    Fall Risk: Fall Risk  11/24/2019 07/20/2019 05/26/2019 03/12/2019 02/10/2019  Falls in the past year? 1 1 1 1 1   Number falls in past yr: 1 1 1  0 1  Injury with Fall? 1 1 1  0 1  Risk for fall due to : -  History of fall(s) Impaired mobility;Impaired balance/gait - -  Follow up - Falls evaluation completed Falls prevention discussed - -     Assessment and Plan:     ICD-10-CM   1. Mixed hyperlipidemia  E78.2    compliant with meds, no concerns, will have her f/up in 3 months in office to f/up and do labs  2. Diabetes mellitus type 2 in  obese (HCC)  E11.69    E66.9    doing better with decreased dose, not checking sugars right now with broken arm, con't same dose and close f/up in office to do labs  3. Current mild episode of major depressive disorder, unspecified whether recurrent (HCC)  F32.0    mood is currently good with adjusted wellbutrin dose, she has no concerns or SE       I discussed the assessment and treatment plan with the patient. The patient was provided an opportunity to ask questions and all were answered. The patient agreed with the plan and demonstrated an understanding of the instructions.  The patient was advised to call back or seek an in-person evaluation if the symptoms worsen or if the condition fails to improve as anticipated.  I provided 18 minutes of non-face-to-face time during this encounter.  Danelle Berry, PA-C 12/15/201:16 PM

## 2019-11-25 ENCOUNTER — Ambulatory Visit: Payer: Medicare Other | Admitting: Urology

## 2019-12-03 ENCOUNTER — Ambulatory Visit: Payer: Medicare Other | Attending: Internal Medicine

## 2019-12-03 DIAGNOSIS — Z20822 Contact with and (suspected) exposure to covid-19: Secondary | ICD-10-CM

## 2019-12-04 LAB — NOVEL CORONAVIRUS, NAA: SARS-CoV-2, NAA: DETECTED — AB

## 2019-12-05 ENCOUNTER — Telehealth: Payer: Self-pay | Admitting: Unknown Physician Specialty

## 2019-12-05 NOTE — Telephone Encounter (Signed)
Called to discuss with patient about Covid symptoms and the use of bamlanivimab, a monoclonal antibody infusion for those with mild to moderate Covid symptoms and at a high risk of hospitalization.  Pt is not qualified for this infusion at the [facility] infusion center due to lack of symptoms.    Discussed to stay quarantined for 10 days after the day of testing.

## 2019-12-24 ENCOUNTER — Telehealth: Payer: Self-pay

## 2019-12-24 NOTE — Telephone Encounter (Signed)
Pt will need virtual visit or urgent care

## 2019-12-24 NOTE — Telephone Encounter (Signed)
lvm to make appt had one open for fri but needs to call us or go to urgent care per nurse

## 2019-12-24 NOTE — Telephone Encounter (Signed)
Copied from CRM [id number]. Topic: General - Other >> Dec 24, 2019  8:22 AM Leafy Ro wrote: Reason for CRM: Darl Pikes the caretaker is calling the pt urine is cloudy since Monday and early this morning pt started having burning.  Pt is getting over covid 19 and quarantine. Darl Pikes would like to bring urine samples

## 2019-12-25 ENCOUNTER — Encounter: Payer: Self-pay | Admitting: Family Medicine

## 2019-12-25 ENCOUNTER — Ambulatory Visit (INDEPENDENT_AMBULATORY_CARE_PROVIDER_SITE_OTHER): Payer: Medicare PPO | Admitting: Family Medicine

## 2019-12-25 VITALS — Ht 62.0 in | Wt 155.0 lb

## 2019-12-25 DIAGNOSIS — R82998 Other abnormal findings in urine: Secondary | ICD-10-CM | POA: Diagnosis not present

## 2019-12-25 DIAGNOSIS — N3001 Acute cystitis with hematuria: Secondary | ICD-10-CM | POA: Diagnosis not present

## 2019-12-25 MED ORDER — CEPHALEXIN 500 MG PO CAPS: 500.0000 mg | ORAL_CAPSULE | Freq: Four times a day (QID) | ORAL | 0 refills | Status: AC

## 2019-12-25 NOTE — Progress Notes (Signed)
Name: Whitney [name]   MRN: [id number]    DOB: [birth year]   Date:12/25/2019       Progress Note  Subjective:    Chief Complaint  Chief Complaint  Patient presents with  . Urinary Tract Infection    symprtoms include dark urine, frequency, and some burning.  Onset 1 week    I connected with  Whitney [name] on 12/25/19 at  1:40 PM EST by telephone and verified that I am speaking with the correct person using two identifiers.   I discussed the limitations, risks, security and privacy concerns of performing an evaluation and management service by telephone and the availability of in person appointments. Staff also discussed with the patient that there may be a patient responsible charge related to this service. Patient Location: home Provider Location: [facility] Additional Individuals present: Darl Pikes home health caregiver   HPI Pt's caregiver/HH Darl Pikes Frequency, nocturia, dysuria, suspected hematuria, urine odor 6 days of worsening urinary sx, started to turn dark Orange red brown over the past 4 d Pt had no change to gait or cognition She is having some decreased appetite secondary to covid 3 weeks ago, and it is improving.  She eating a full plate of food right now with spaghetti and salad. They have tried pushing fluids, no improvement to her urinary complaints they have not used AZO or any other meds over the counter No abd pain, N, V, fever, weight loss, decreased urine, back pain     Patient Active Problem List   Diagnosis Date Noted  . Vitamin D deficiency 07/20/2019  . 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HCC) 07/20/2019  . At high risk for falls 07/20/2019  . Mass on back 01/21/2019  . Fatigue 01/15/2019  . Parkinson disease (HCC) 01/15/2019  . Hyperlipidemia 01/15/2019  . Diabetes mellitus type 2 in obese (HCC) 01/13/2019  . Goiter 01/13/2019  . Overactive bladder 01/13/2019  . Obesity (BMI 30.0-34.9) 01/13/2019  . Medication monitoring encounter  01/13/2019  . Gait abnormality 01/13/2019    Social History   Tobacco Use  . Smoking status: Former Smoker    Packs/day: 2.00    Years: 40.00    Pack years: 80.00    Types: Cigarettes    Quit date: 12/10/2018    Years since quitting: 1.0  . Smokeless tobacco: Never Used  Substance Use Topics  . Alcohol use: Never     Current Outpatient Medications:  .  aspirin EC 81 MG tablet, Take 1 tablet (81 mg total) by mouth daily. (take at least one hour prior to any ibuprofen), Disp: , Rfl:  .  atorvastatin (LIPITOR) 40 MG tablet, Take 1 tablet (40 mg total) by mouth at bedtime., Disp: 90 tablet, Rfl: 3 .  buPROPion (WELLBUTRIN XL) 150 MG 24 hr tablet, TAKE 1 TABLET(150 MG) BY MOUTH DAILY, Disp: 90 tablet, Rfl: 1 .  carbidopa-levodopa (SINEMET IR) 25-100 MG tablet, Take 1.5 pills three times a day for one week then increase to 2 pills three times a day, Disp: , Rfl:  .  metFORMIN (GLUCOPHAGE-XR) 500 MG 24 hr tablet, TAKE 1 TABLET(500 MG) BY MOUTH TWICE DAILY, Disp: 60 tablet, Rfl: 2 .  solifenacin (VESICARE) 5 MG tablet, Take 1 tablet (5 mg total) by mouth daily., Disp: 30 tablet, Rfl: 11 .  vitamin B-12 (CYANOCOBALAMIN) 100 MCG tablet, Take 100 mcg by mouth daily., Disp: , Rfl:  .  Vitamin D, Ergocalciferol, (DRISDOL) 1.25 MG (50000 UT) CAPS capsule, Take by mouth., Disp: ,  Rfl:  .  diphenhydrAMINE (BENADRYL) 25 MG tablet, Take 25 mg by mouth as needed. , Disp: , Rfl:   No Known Allergies  Chart Review: Reviewed patient's most recent labs, her allergy list, her problem list  Review of Systems  Constitutional: Negative.   HENT: Negative.   Eyes: Negative.   Respiratory: Negative.   Cardiovascular: Negative.   Gastrointestinal: Negative.   Endocrine: Negative.   Genitourinary: Negative for difficulty urinating, flank pain and genital sores.  Musculoskeletal: Negative.   Skin: Negative.   Allergic/Immunologic: Negative.   Neurological: Negative.   Hematological: Negative.     Psychiatric/Behavioral: Negative.   All other systems reviewed and are negative.    Objective:    Virtual encounter, vitals limited, only able to obtain the following Today's Vitals   12/25/19 1221  Weight: 155 lb (70.3 kg)  Height: 5\' 2"  (1.575 m)   Body mass index is 28.35 kg/m. Nursing Note and Vital Signs reviewed.  Physical Exam Only able to hear the patient's voice in the background she is alert normal phonation, her caregiver states she is sitting upright eating well-appearing PE limited by telephone encounter  No results found for this or any previous visit (from the past 72 hour(s)).  Assessment and Plan:     ICD-10-CM   1. Acute cystitis with hematuria  N30.01 UA w/reflex microscopy, LAB    Urine Culture    CMP w GFR   Per hx suspicious for uti, tx empirically with Keflex, push fluids, patient will need evaluation if any decreased urine output or worsening symptoms  2. Dark urine  R82.998 UA w/reflex microscopy, LAB    Urine Culture    CMP w GFR  labs ordered so pt could do oupt labs at [facility] if needed.  I reviewed with the patient's caregiver multiple times concerning signs and symptoms including but not limited to decreased urine output, cognitive changes, worsening abdominal pain, vomiting or inability to take medications or tolerate PO's.  Would like them to call us we can arrange follow-up if she has no red flags but she does not seem to be improving much would like to test urine and obtain a culture due to patient's age, but today with patient's recent Covid illness her caregiver is on sure of how she can get a urine sample to [place] before we closed today  -Red flags and when to present for emergency care or RTC including but not limited to new/worsening/un-resolving symptoms,  reviewed with patient at time of visit. Follow up and care instructions discussed and provided in AVS. - I discussed the assessment and treatment plan with the patient. The patient was  provided an opportunity to ask questions and all were answered. The patient agreed with the plan and demonstrated an understanding of the instructions.  - The patient was advised to call back or seek an in-person evaluation if the symptoms worsen or if the condition fails to improve as anticipated.  I provided 12 minutes of non-face-to-face time during this encounter.  Delsa Grana, PA-C 12/25/19 2:05 PM

## 2019-12-31 ENCOUNTER — Telehealth: Payer: Self-pay

## 2019-12-31 NOTE — Telephone Encounter (Signed)
I will look at it tomorrow

## 2019-12-31 NOTE — Telephone Encounter (Signed)
Copied from CRM [id number]. Topic: General - Inquiry >> Dec 31, 2019 12:22 PM Reggie Pile, [place] wrote: Reason for CRM: Donia Pounds, caregiver of patient, called in stating some paperwork for PCP to fill out for patient. States it needs to be completed as soon as possible. Caregiver will be dropping off paperwork after 2pm today.

## 2020-01-01 ENCOUNTER — Other Ambulatory Visit: Payer: Self-pay

## 2020-01-01 ENCOUNTER — Ambulatory Visit (INDEPENDENT_AMBULATORY_CARE_PROVIDER_SITE_OTHER): Payer: Medicare PPO | Admitting: Urology

## 2020-01-01 ENCOUNTER — Encounter: Payer: Self-pay | Admitting: Urology

## 2020-01-01 VITALS — BP 116/74 | HR 84 | Ht 62.0 in

## 2020-01-01 DIAGNOSIS — N3941 Urge incontinence: Secondary | ICD-10-CM

## 2020-01-01 DIAGNOSIS — R350 Frequency of micturition: Secondary | ICD-10-CM

## 2020-01-01 MED ORDER — SOLIFENACIN SUCCINATE 5 MG PO TABS: 5.0000 mg | ORAL_TABLET | Freq: Every day | ORAL | 11 refills | Status: DC

## 2020-01-01 MED ORDER — MIRABEGRON ER 25 MG PO TB24: 25.0000 mg | ORAL_TABLET | Freq: Every day | ORAL | 11 refills | Status: DC

## 2020-01-01 NOTE — Telephone Encounter (Signed)
Called scheduled pt appt

## 2020-01-01 NOTE — Telephone Encounter (Signed)
Caregiver called and would like to know if the paper work has been completed?  Whitney [name]

## 2020-01-01 NOTE — Telephone Encounter (Signed)
   [phone]- caregiver

## 2020-01-01 NOTE — Progress Notes (Signed)
01/01/2020 2:43 PM   Whitney [name] Feb 08, 1941 [id number]  Referring provider: Kerman Passey, MD [address] [place],  [place] [address]  No chief complaint on file.   HPI:   F/u - urinary frequency and urgency March 2020.She has urgency with running water and standing. She has some urge incontinence. She doesn't need pads every day.She has been on Myrbetriq 50 mg. She voids with a good stream. No gross hematuria. She had a cystotomy in 1980 during Hx that was repaired. Bowels are regular.  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 She has had some recent gait abnormalities and Dr. Sherie Don considered MRI lumbar spinebut pt derferred as she's dealing with a left shoulder/arm issue.Her UA was normal. PVR was 0 ml.   We added solifenacin to [facility] March 2020. She did well. Noc and frequency improved. She had dysuria last week and she was treated empirically with cephalexin. UA and urine cx ordered but not done. Her symptoms resolved with abx. Urine clear. She stopped Myrbetriq and nocturia increased again. On solifenacin.    Past Medical History:  Diagnosis Date  . Arthritis   . Broken shoulder   . Broken shoulder   . COVID-19   . Depression   . Diabetes mellitus without complication (HCC)   . Goiter   . Overactive bladder   . Parkinson's disease (HCC)   . Unsteady gait     Surgical History: Past Surgical History:  Procedure Laterality Date  . ABDOMINAL HYSTERECTOMY    . BLADDER REPAIR    . NECK SURGERY      Home Medications:  Allergies as of 01/01/2020   No Known Allergies     Medication List       Accurate as of January 01, 2020  2:43 PM. If you have any questions, ask your nurse or doctor.        aspirin EC 81 MG tablet Take 1 tablet (81 mg total) by mouth daily. (take at least one hour prior to any ibuprofen)   atorvastatin 40 MG tablet Commonly known as: LIPITOR Take 1 tablet (40 mg total) by mouth at  bedtime.   buPROPion 150 MG 24 hr tablet Commonly known as: WELLBUTRIN XL TAKE 1 TABLET(150 MG) BY MOUTH DAILY   carbidopa-levodopa 25-100 MG tablet Commonly known as: SINEMET IR Take 1.5 pills three times a day for one week then increase to 2 pills three times a day   diphenhydrAMINE 25 MG tablet Commonly known as: BENADRYL Take 25 mg by mouth as needed.   metFORMIN 500 MG 24 hr tablet Commonly known as: GLUCOPHAGE-XR TAKE 1 TABLET(500 MG) BY MOUTH TWICE DAILY   solifenacin 5 MG tablet Commonly known as: VESICARE Take 1 tablet (5 mg total) by mouth daily.   vitamin B-12 100 MCG tablet Commonly known as: CYANOCOBALAMIN Take 100 mcg by mouth daily.   Vitamin D (Ergocalciferol) 1.25 MG (50000 UNIT) Caps capsule Commonly known as: DRISDOL Take by mouth.       Allergies: No Known Allergies  Family History: Family History  Problem Relation Age of Onset  . Stroke Mother   . Hypertension Mother   . Diabetes Father   . Heart disease Father   . Lung cancer Father   . Colon cancer Sister   . Diabetes Sister   . Kidney disease Daughter   . Kidney cancer Daughter   . Pulmonary embolism Sister   . Diabetes Sister   . Kidney disease Sister   .  Heart disease Sister   . Atrial fibrillation Sister   . Diabetes Brother   . Heart disease Brother   . AAA (abdominal aortic aneurysm) Brother   . Cancer Brother        skin    Social History:  reports that she quit smoking about 12 months ago. Her smoking use included cigarettes. She has a 80.00 pack-year smoking history. She has never used smokeless tobacco. She reports that she does not drink alcohol or use drugs.  ROS:                                        Physical Exam: There were no vitals taken for this visit.  Constitutional:  Alert and oriented, No acute distress. In wheelchair.  HEENT: [place] AT, moist mucus membranes.  Trachea midline, no masses. Respiratory: Normal respiratory effort, no  increased work of breathing. GI: Abdomen is soft, nontender, nondistended, no abdominal masses GU: No CVA tenderness Skin: No rashes, bruises or suspicious lesions. Neurologic: Grossly intact, no focal deficits, moving all 4 extremities. Psychiatric: Normal mood and affect.  Laboratory Data: Lab Results  Component Value Date   WBC 9.3 11/17/2019   HGB 11.6 (L) 11/17/2019   HCT 36.2 11/17/2019   MCV 85.2 11/17/2019   PLT 155 11/17/2019    Lab Results  Component Value Date   CREATININE 0.51 11/17/2019    No results found for: PSA  No results found for: TESTOSTERONE  Lab Results  Component Value Date   HGBA1C 7.3 (A) 07/20/2019    Urinalysis    Component Value Date/Time   COLORURINE DARK YELLOW 07/22/2019 1130   APPEARANCEUR CLEAR 07/22/2019 1130   LABSPEC 1.019 07/22/2019 1130   PHURINE < OR = 5.0 07/22/2019 1130   GLUCOSEU NEGATIVE 07/22/2019 1130   HGBUR NEGATIVE 07/22/2019 1130   KETONESUR NEGATIVE 07/22/2019 1130   PROTEINUR NEGATIVE 07/22/2019 1130    Lab Results  Component Value Date   BACTERIA NONE SEEN 07/22/2019    Pertinent Imaging: N/a  No results found for this or any previous visit. No results found for this or any previous visit. No results found for this or any previous visit. No results found for this or any previous visit. No results found for this or any previous visit. No results found for this or any previous visit. No results found for this or any previous visit. No results found for this or any previous visit.  Assessment & Plan:    Continue solifenacin and add back Myrbetriq - see in 1 year or sooner if issues.   No follow-ups on file.  Festus Aloe, MD  [facility] [address] [place], [place] [address] [phone]

## 2020-01-05 ENCOUNTER — Other Ambulatory Visit: Payer: Self-pay

## 2020-01-05 ENCOUNTER — Ambulatory Visit (INDEPENDENT_AMBULATORY_CARE_PROVIDER_SITE_OTHER): Payer: Medicare PPO | Admitting: Family Medicine

## 2020-01-05 ENCOUNTER — Encounter: Payer: Self-pay | Admitting: Family Medicine

## 2020-01-05 VITALS — BP 112/62 | HR 101 | Temp 97.6°F | Resp 16 | Ht 62.0 in | Wt 162.3 lb

## 2020-01-05 DIAGNOSIS — B372 Candidiasis of skin and nail: Secondary | ICD-10-CM

## 2020-01-05 DIAGNOSIS — E1169 Type 2 diabetes mellitus with other specified complication: Secondary | ICD-10-CM | POA: Diagnosis not present

## 2020-01-05 DIAGNOSIS — G20 Parkinson's disease: Secondary | ICD-10-CM | POA: Diagnosis not present

## 2020-01-05 DIAGNOSIS — E669 Obesity, unspecified: Secondary | ICD-10-CM

## 2020-01-05 DIAGNOSIS — R269 Unspecified abnormalities of gait and mobility: Secondary | ICD-10-CM

## 2020-01-05 DIAGNOSIS — E559 Vitamin D deficiency, unspecified: Secondary | ICD-10-CM | POA: Diagnosis not present

## 2020-01-05 DIAGNOSIS — D649 Anemia, unspecified: Secondary | ICD-10-CM | POA: Diagnosis not present

## 2020-01-05 DIAGNOSIS — Z5181 Encounter for therapeutic drug level monitoring: Secondary | ICD-10-CM

## 2020-01-05 DIAGNOSIS — G20A1 Parkinson's disease without dyskinesia, without mention of fluctuations: Secondary | ICD-10-CM

## 2020-01-05 DIAGNOSIS — F320 Major depressive disorder, single episode, mild: Secondary | ICD-10-CM

## 2020-01-05 DIAGNOSIS — R29898 Other symptoms and signs involving the musculoskeletal system: Secondary | ICD-10-CM

## 2020-01-05 DIAGNOSIS — N3281 Overactive bladder: Secondary | ICD-10-CM

## 2020-01-05 MED ORDER — FLUCONAZOLE 150 MG PO TABS: 150.0000 mg | ORAL_TABLET | ORAL | 0 refills | Status: DC | PRN

## 2020-01-05 MED ORDER — MICONAZOLE NITRATE 2 % EX POWD: Freq: Two times a day (BID) | CUTANEOUS | 1 refills | Status: DC | PRN

## 2020-01-05 NOTE — Progress Notes (Signed)
Name: Whitney [name]   MRN: [id number]    DOB: Jun 09, 1941   Date:01/05/2020       Progress Note  Chief Complaint  Patient presents with  . Paperwork   Subjective:   Whitney [name] is a 79 y.o. female, presents to clinic for routine follow up on the conditions listed above.  Pt currently need 24 hour assistance after right shoulder fracture, she cannot bathe, get dressed, has some difficulty ambulating around due to her Parkinson's gait abnormalities and weakness.  She can feed herself with her nondominant hand but she cannot prepare food on her own.  F/up DM-patient continues to have difficulty tolerating Metformin.  some decrease in appetite -unsure but secondary to meds or secondary to COVID with fatigue and decreased appetite loss of taste and smell temporarily  Some anemia with last labs hgb 11.6 -patient denies any bleeding bruising, melena, hematochezia, will repeat labs today  Right shoulder fracture after fall -she is seeing the specialist for management we will have her follow-up in the next week.  She is compliant with the sling and bracing.  Reviewed all records today with her and her caregiver.  Hx of parkinsons, worsening gait, mobility and strength was doing PT and seeing neuro. Patient has forms from the [place] today which they request I complete regarding aid in home  Patient Active Problem List   Diagnosis Date Noted  . Vitamin D deficiency 07/20/2019  . 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[place]) 07/20/2019  . At high risk for falls 07/20/2019  . Mass on back 01/21/2019  . Fatigue 01/15/2019  . Parkinson disease ([place]) 01/15/2019  . Hyperlipidemia 01/15/2019  . Diabetes mellitus type 2 in obese ([place]) 01/13/2019  . Goiter 01/13/2019  . Overactive bladder 01/13/2019  . Obesity (BMI 30.0-34.9) 01/13/2019  . Medication monitoring encounter 01/13/2019  . Gait abnormality 01/13/2019    Past Surgical History:  Procedure Laterality Date  . ABDOMINAL  HYSTERECTOMY    . BLADDER REPAIR    . NECK SURGERY      Family History  Problem Relation Age of Onset  . Stroke Mother   . Hypertension Mother   . Diabetes Father   . Heart disease Father   . Lung cancer Father   . Colon cancer Sister   . Diabetes Sister   . Kidney disease Daughter   . Kidney cancer Daughter   . Pulmonary embolism Sister   . Diabetes Sister   . Kidney disease Sister   . Heart disease Sister   . Atrial fibrillation Sister   . Diabetes Brother   . Heart disease Brother   . AAA (abdominal aortic aneurysm) Brother   . Cancer Brother        skin    Social History   Socioeconomic History  . Marital status: Widowed    Spouse name: Jeneen Rinks  . Number of children: 2  . Years of education: 15  . Highest education level: Some college, no degree  Occupational History  . Occupation: retired Conservation officer, historic buildings  Tobacco Use  . Smoking status: Former Smoker    Packs/day: 2.00    Years: 40.00    Pack years: 80.00    Types: Cigarettes    Quit date: 12/10/2018    Years since quitting: 1.0  . Smokeless tobacco: Never Used  Substance and Sexual Activity  . Alcohol use: Never  . Drug use: Never  . Sexual activity: Not Currently  Other Topics Concern  . Not on file  Social History Narrative   Lost her husband of 57 years in oct 2019. Moved back from [place] in Nov 2019   Social Determinants of Health   Financial Resource Strain: Low Risk   . Difficulty of Paying Living Expenses: Not hard at all  Food Insecurity: No Food Insecurity  . Worried About Programme researcher, broadcasting/film/video in the Last Year: Never true  . Ran Out of Food in the Last Year: Never true  Transportation Needs: No Transportation Needs  . Lack of Transportation (Medical): No  . Lack of Transportation (Non-Medical): No  Physical Activity: Inactive  . Days of Exercise per Week: 0 days  . Minutes of Exercise per Session: 0 min  Stress: Stress Concern Present  . Feeling of Stress : To some extent  Social  Connections: Moderately Isolated  . Frequency of Communication with Friends and Family: More than three times a week  . Frequency of Social Gatherings with Friends and Family: More than three times a week  . Attends Religious Services: Never  . Active Member of Clubs or Organizations: No  . Attends Banker Meetings: Never  . Marital Status: Widowed  Intimate Partner Violence: Not At Risk  . Fear of Current or Ex-Partner: No  . Emotionally Abused: No  . Physically Abused: No  . Sexually Abused: No     Current Outpatient Medications:  .  aspirin EC 81 MG tablet, Take 1 tablet (81 mg total) by mouth daily. (take at least one hour prior to any ibuprofen), Disp: , Rfl:  .  atorvastatin (LIPITOR) 40 MG tablet, Take 1 tablet (40 mg total) by mouth at bedtime., Disp: 90 tablet, Rfl: 3 .  buPROPion (WELLBUTRIN XL) 150 MG 24 hr tablet, TAKE 1 TABLET(150 MG) BY MOUTH DAILY, Disp: 90 tablet, Rfl: 1 .  carbidopa-levodopa (SINEMET IR) 25-100 MG tablet, Take 1.5 pills three times a day for one week then increase to 2 pills three times a day, Disp: , Rfl:  .  metFORMIN (GLUCOPHAGE-XR) 500 MG 24 hr tablet, TAKE 1 TABLET(500 MG) BY MOUTH TWICE DAILY, Disp: 60 tablet, Rfl: 2 .  mirabegron ER (MYRBETRIQ) 25 MG TB24 tablet, Take 1 tablet (25 mg total) by mouth daily., Disp: 30 tablet, Rfl: 11 .  solifenacin (VESICARE) 5 MG tablet, Take 1 tablet (5 mg total) by mouth daily., Disp: 30 tablet, Rfl: 11 .  vitamin B-12 (CYANOCOBALAMIN) 100 MCG tablet, Take 100 mcg by mouth daily., Disp: , Rfl:  .  Vitamin D, Ergocalciferol, (DRISDOL) 1.25 MG (50000 UT) CAPS capsule, Take by mouth., Disp: , Rfl:   No Known Allergies  Chart Review Today: On day of encounter, I personally reviewed active problem list, medication list, allergies, family history, social history, health maintenance, notes from last encounter, lab results, imaging with the patient/caregiver today- also reviewed multiple specialist visits,  imaging, through this EMR and through care everywhere   Review of Systems  Constitutional: Negative.   HENT: Negative.   Eyes: Negative.   Respiratory: Negative.   Cardiovascular: Negative.   Gastrointestinal: Negative.   Endocrine: Negative.   Genitourinary: Negative.   Musculoskeletal: Negative.   Skin: Negative.   Allergic/Immunologic: Negative.   Neurological: Negative.   Hematological: Negative.   Psychiatric/Behavioral: Negative.   All other systems reviewed and are negative.    Objective:    Vitals:   01/05/20 0820  BP: 112/62  Pulse: (!) 101  Resp: 16  Temp: 97.6 F (36.4 C)  SpO2: 95%  Weight: 162  lb 4.8 oz (73.6 kg)  Height: 5\' 2"  (1.575 m)    Body mass index is 29.69 kg/m.  Physical Exam Vitals and nursing note reviewed.  Constitutional:      Appearance: She is well-developed.     Comments: Elderly female right arm in sling and brace, no acute distress  HENT:     Head: Normocephalic and atraumatic.     Nose: Nose normal.  Eyes:     General:        Right eye: No discharge.        Left eye: No discharge.     Conjunctiva/sclera: Conjunctivae normal.  Neck:     Trachea: No tracheal deviation.  Cardiovascular:     Rate and Rhythm: Normal rate and regular rhythm.  Pulmonary:     Effort: Pulmonary effort is normal. No respiratory distress.     Breath sounds: No stridor.  Musculoskeletal:     Comments: Right shoulder in brace and sling with ace wrap under  Skin:    General: Skin is warm and dry.     Capillary Refill: Capillary refill takes less than 2 seconds.     Coloration: Skin is not pale.     Findings: No rash.  Neurological:     Mental Status: She is alert.     Motor: Weakness (generalized LE weakness, slow to stand from sitting) present. No abnormal muscle tone.     Coordination: Coordination normal.     Gait: Gait abnormal.  Psychiatric:        Mood and Affect: Mood normal.        Behavior: Behavior normal.       PHQ2/9: Depression screen [facility] 2/9 01/05/2020 12/25/2019 11/24/2019 07/20/2019 05/26/2019  Decreased Interest 0 0 0 0 1  Down, Depressed, Hopeless 1 0 0 0 1  PHQ - 2 Score 1 0 0 0 2  Altered sleeping 1 0 0 0 1  Tired, decreased energy 3 0 0 1 3  Change in appetite 1 0 0 1 0  Feeling bad or failure about yourself  2 0 0 1 0  Trouble concentrating 0 0 0 1 0  Moving slowly or fidgety/restless 0 0 0 1 0  Suicidal thoughts 0 0 0 0 0  PHQ-9 Score 8 0 0 5 6  Difficult doing work/chores Somewhat difficult Not difficult at all Not difficult at all Somewhat difficult Not difficult at all    phq 9 is positive secondary to physical changes affecting sleep also some depressive thoughts she will continue Wellbutrin  Fall Risk: Fall Risk  01/05/2020 12/25/2019 11/24/2019 07/20/2019 05/26/2019  Falls in the past year? 1 1 1 1 1   Number falls in past yr: 1 1 1 1 1   Injury with Fall? 1 1 1 1 1   Risk for fall due to : - - - History of fall(s) Impaired mobility;Impaired balance/gait  Follow up - - - Falls evaluation completed Falls prevention discussed    Functional Status Survey: Is the patient deaf or have difficulty hearing?: No Does the patient have difficulty seeing, even when wearing glasses/contacts?: No Does the patient have difficulty concentrating, remembering, or making decisions?: No Does the patient have difficulty walking or climbing stairs?: Yes Does the patient have difficulty dressing or bathing?: Yes Does the patient have difficulty doing errands alone such as visiting a doctor's office or shopping?: Yes   Assessment & Plan:     ICD-10-CM   1. Diabetes mellitus type 2 in obese (HCC)  E11.69 COMPLETE METABOLIC PANEL WITH GFR   E66.9 Hemoglobin A1c   Stable, has been well controlled, some med intolerance, recheck labs  2. Anemia, unspecified type  D64.9 CBC with Differential/Platelet   Recheck labs and will do work-up if anemia still persists  3. Vitamin D deficiency  E55.9 VITAMIN  D 25 Hydroxy (Vit-D Deficiency, Fractures)   Check vitamin D level with fracture we want to ensure it is at normal level for optimal healing and decrease risk of future fractures  4. Parkinson disease (HCC)  G20    Per neuro  5. Current mild episode of major depressive disorder, unspecified whether recurrent (HCC)  F32.0    Patient wishes to continue current medications, will need to follow-up on PHQ score  6. Gait abnormality  R26.9    Secondary to Parkinson's and some deconditioning and generalized weakness she was doing physical therapy and improving but loss progress secondary to fracture  7. Overactive bladder  N32.81    Continued symptoms, not fully well controlled with medications  8. Leg weakness, bilateral  R29.898    See above, resume PT when able  9. Medication monitoring encounter  Z51.81    Check labs  10. Intertriginous candidiasis  B37.2    When leaving the exam room today patient did show confluent erythematous rash under breast skin folds Encouraged her and her nurse aide to help keep skin folds dry, apply topical antifungal powder and if no improvement with antifungal powders and topical creams can use oral Diflucan -will need to follow-up if not improving or if any worsening    Spent more than 10 min today completing forms with pt during encounter in exam room.  Copy obtained and should be scanned into chart  More than 45 minutes were spent today on the encounter, time spent in same day as appointment, doing chart review, reviewing multiple specialist visits, accessing care everywhere, reviewing hospital and ER visits, completing paperwork today, doing routine conditions and acute conditions.  Patient will also need to start back at outpatient PT possibly OT when she is cleared from Ortho  Return for 4 months DM HLD f/up.   Danelle Berry, PA-C 01/05/20 8:44 AM

## 2020-01-06 LAB — COMPLETE METABOLIC PANEL WITH GFR
AG Ratio: 1.3 (calc) (ref 1.0–2.5)
ALT: 27 U/L (ref 6–29)
AST: 40 U/L — ABNORMAL HIGH (ref 10–35)
Albumin: 3.6 g/dL (ref 3.6–5.1)
Alkaline phosphatase (APISO): 153 U/L (ref 37–153)
BUN: 13 mg/dL (ref 7–25)
CO2: 27 mmol/L (ref 20–32)
Calcium: 9.2 mg/dL (ref 8.6–10.4)
Chloride: 102 mmol/L (ref 98–110)
Creat: 0.67 mg/dL (ref 0.60–0.93)
GFR, Est African American: 98 mL/min/{1.73_m2} (ref >=60)
GFR, Est Non African American: 84 mL/min/{1.73_m2} (ref >=60)
Globulin: 2.7 g/dL (calc) (ref 1.9–3.7)
Glucose, Bld: 204 mg/dL — ABNORMAL HIGH (ref 65–99)
Potassium: 4.1 mmol/L (ref 3.5–5.3)
Sodium: 139 mmol/L (ref 135–146)
Total Bilirubin: 0.8 mg/dL (ref 0.2–1.2)
Total Protein: 6.3 g/dL (ref 6.1–8.1)

## 2020-01-06 LAB — HEMOGLOBIN A1C
Hgb A1c MFr Bld: 6.2 % of total Hgb — ABNORMAL HIGH (ref <5.7)
Mean Plasma Glucose: 131 (calc)
eAG (mmol/L): 7.3 (calc)

## 2020-01-06 LAB — CBC WITH DIFFERENTIAL/PLATELET
Absolute Monocytes: 352 cells/uL (ref 200–950)
Basophils Absolute: 31 cells/uL (ref 0–200)
Basophils Relative: 0.6 %
Eosinophils Absolute: 153 cells/uL (ref 15–500)
Eosinophils Relative: 3 %
HCT: 38.6 % (ref 35.0–45.0)
Hemoglobin: 12.2 g/dL (ref 11.7–15.5)
Lymphs Abs: 867 cells/uL (ref 850–3900)
MCH: 27.4 pg (ref 27.0–33.0)
MCHC: 31.6 g/dL — ABNORMAL LOW (ref 32.0–36.0)
MCV: 86.7 fL (ref 80.0–100.0)
MPV: 11.5 fL (ref 7.5–12.5)
Monocytes Relative: 6.9 %
Neutro Abs: 3698 cells/uL (ref 1500–7800)
Neutrophils Relative %: 72.5 %
Platelets: 160 10*3/uL (ref 140–400)
RBC: 4.45 10*6/uL (ref 3.80–5.10)
RDW: 15.3 % — ABNORMAL HIGH (ref 11.0–15.0)
Total Lymphocyte: 17 %
WBC: 5.1 10*3/uL (ref 3.8–10.8)

## 2020-01-06 LAB — VITAMIN D 25 HYDROXY (VIT D DEFICIENCY, FRACTURES): Vit D, 25-Hydroxy: 31 ng/mL (ref 30–100)

## 2020-02-08 ENCOUNTER — Other Ambulatory Visit: Payer: Self-pay

## 2020-02-08 MED ORDER — METFORMIN HCL ER 500 MG PO TB24: ORAL_TABLET | ORAL | 3 refills | Status: DC

## 2020-02-08 NOTE — Telephone Encounter (Signed)
Last OV for DM:01/05/20 Last lab:1/26 Hgb A1c MFr Bld <5.7 % of total Hgb 6.2High

## 2020-03-14 ENCOUNTER — Telehealth: Payer: Self-pay

## 2020-03-14 DIAGNOSIS — R269 Unspecified abnormalities of gait and mobility: Secondary | ICD-10-CM

## 2020-03-14 DIAGNOSIS — G20 Parkinson's disease: Secondary | ICD-10-CM

## 2020-03-14 DIAGNOSIS — R29898 Other symptoms and signs involving the musculoskeletal system: Secondary | ICD-10-CM

## 2020-03-14 NOTE — Telephone Encounter (Signed)
Reached out to patient for concerned falls.  She states has been having several falls in house, use to have home health but was to expensive so had to stop.  I recommended OT to come out for several weeks to help her and she agreed.  Will place referral.

## 2020-03-24 ENCOUNTER — Other Ambulatory Visit: Payer: Self-pay | Admitting: Family Medicine

## 2020-03-24 DIAGNOSIS — E782 Mixed hyperlipidemia: Secondary | ICD-10-CM

## 2020-04-01 ENCOUNTER — Other Ambulatory Visit: Payer: Self-pay

## 2020-04-01 MED ORDER — BUPROPION HCL ER (XL) 150 MG PO TB24: ORAL_TABLET | ORAL | 1 refills | Status: DC

## 2020-04-04 ENCOUNTER — Telehealth: Payer: Self-pay | Admitting: Family Medicine

## 2020-04-04 NOTE — Telephone Encounter (Signed)
Copied from CRM [id number]. Topic: General - Other >> Apr 04, 2020  2:22 PM Tamela Oddi wrote: Reason for CRM: Patient's sister called to speak with the doctor regarding patient's medication for buPROPion (WELLBUTRIN XL) 150 MG 24 hr tablet.  She stated that the script was sent incorrectly for the MGs.  She would like to speak with the doctor as soon as possible.  CB# [phone]

## 2020-04-04 NOTE — Telephone Encounter (Signed)
Whitney [name] has been refilling for 150mg  pt notified

## 2020-04-07 ENCOUNTER — Other Ambulatory Visit: Payer: Self-pay

## 2020-04-07 ENCOUNTER — Ambulatory Visit (INDEPENDENT_AMBULATORY_CARE_PROVIDER_SITE_OTHER): Payer: Medicare PPO | Admitting: Family Medicine

## 2020-04-07 ENCOUNTER — Encounter: Payer: Self-pay | Admitting: Family Medicine

## 2020-04-07 VITALS — BP 118/72 | HR 97 | Temp 98.3°F | Resp 14 | Ht 62.0 in | Wt 150.1 lb

## 2020-04-07 DIAGNOSIS — Z7409 Other reduced mobility: Secondary | ICD-10-CM

## 2020-04-07 DIAGNOSIS — E2839 Other primary ovarian failure: Secondary | ICD-10-CM

## 2020-04-07 DIAGNOSIS — H1032 Unspecified acute conjunctivitis, left eye: Secondary | ICD-10-CM | POA: Diagnosis not present

## 2020-04-07 DIAGNOSIS — R269 Unspecified abnormalities of gait and mobility: Secondary | ICD-10-CM

## 2020-04-07 DIAGNOSIS — R29898 Other symptoms and signs involving the musculoskeletal system: Secondary | ICD-10-CM

## 2020-04-07 DIAGNOSIS — Z789 Other specified health status: Secondary | ICD-10-CM

## 2020-04-07 DIAGNOSIS — F320 Major depressive disorder, single episode, mild: Secondary | ICD-10-CM

## 2020-04-07 DIAGNOSIS — G20 Parkinson's disease: Secondary | ICD-10-CM

## 2020-04-07 DIAGNOSIS — E46 Unspecified protein-calorie malnutrition: Secondary | ICD-10-CM | POA: Diagnosis not present

## 2020-04-07 DIAGNOSIS — Z1231 Encounter for screening mammogram for malignant neoplasm of breast: Secondary | ICD-10-CM

## 2020-04-07 DIAGNOSIS — Z9181 History of falling: Secondary | ICD-10-CM

## 2020-04-07 MED ORDER — OLOPATADINE HCL 0.2 % OP SOLN: 1.0000 [drp] | Freq: Every day | OPHTHALMIC | 2 refills | Status: DC

## 2020-04-07 NOTE — Patient Instructions (Addendum)
Gel dry eye drops and lumify eye drops over the counter   Please call or send me a message if your eye is getting worse or having thicker puss draining throughout the day  Crusting, tearing and irritation are normal symptoms with dry eye You can try pataday eye drops for allergies   Continue wellbutrin at the 150 mg dose once a day - please call me if you have any change to moods or feel down, depressed or anxious.  Diabetes - your diabetes is well controlled, You can continue to take metformin 500 mg twice a day - however if your stomach is bothering you, DECREASE metformin dose to only once a day with food and please let me know  You are loosing weight so it is important to have at least three meals a day and have a boost or ensure in addition to that.  You do not have to have big meals, but make sure you have protein in your diet and you need to follow up SOONER than next appointment if you loose more weight.   Good for dry eye    Good for redness and irritation

## 2020-04-07 NOTE — Progress Notes (Signed)
Patient ID: Whitney [name], female    DOB: [birth year], 79 y.o.   MRN: [id number]  PCP: Danelle Berry, PA-C  Chief Complaint  Patient presents with  . Extremity Weakness    abnormal gait and recurrent falls.  I starting with home health PT  . Eye Problem    left, red, itchy and crusty    Subjective:   Whitney [name] is a 79 y.o. female, presents to clinic with CC of the following:  HPI  Pt presents with worsening gait, weakness, increased fall - she has hx of parkinson disease, is seeing neurology, Dr. Sherryll Burger at [facility] clinic, saw recently and she is doing [facility] PT. 03/28/2020 neuro OV reviewed through care everywhere.  She saw Harlin Rain PA-C Plan  1. Suspected Parkinson's Disease- decreased eye blinking + shuffling gait + mild bradykinesia bilateral left > right + cog wheeling left > right - stable   - Referral in home physical therapy and occupational therapy due to frequent falls   Recommend that you take medication 30 minutes prior to eating provided you do not get nauseous.   - Take Sinemet/carbidopa-Levodopa, 50/200 mg tablet Take one tablet four times daily 8:00am, 12:00pm, 4:00pm and 8:00pm   Ropinirole/Requip 0.25 mg tablet Take 1 tablet four times daily (every time you take Sinemet) Disp 90 Refill 3  - Advised patient to start using rolling walker with a seat for ambulation.   2. 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 Depression - per primary care physician  - Taking Wellbutrin 150 mg daily - consider increasing dose to help with this. Patient should discuss with primary care physician.   4. Recent fall with concern for additional trauma to right shoulder with previous fracture  Recommend follow up with orthopedics given the possible need for Physical  Therapy for right shoulder Also, given recent fall, they may want to re-evaluate your shoulder/range of motion    Whitney [name] today complains of left eye irritation - left eye has some redness, drainage and crusting.  She does have decreased blinking as a sx of parkinsons.  She has not seen ophthomology.  Eye sx started to bother her in the last couple weeks.    [facility] out of [place], PT came to do assessment She has 3 people come this week 5visits?  Mood/anxiety/depression Depression screen [facility] 2/9 04/07/2020 01/05/2020 12/25/2019  Decreased Interest 0 0 0  Down, Depressed, Hopeless 0 1 0  PHQ - 2 Score 0 1 0  Altered sleeping 0 1 0  Tired, decreased energy 0 3 0  Change in appetite 0 1 0  Feeling bad or failure about yourself  0 2 0  Trouble concentrating 0 0 0  Moving slowly or fidgety/restless 0 0 0  Suicidal thoughts 0 0 0  PHQ-9 Score 0 8 0  Difficult doing work/chores Not difficult at all Somewhat difficult Not difficult at all  Some recent data might be hidden   She is continue to take wellbutrin 150 milligrams daily  Diabetes, last visit in January, we have been decreasing medications due to well-controlled diabetes with GI symptoms Lab Results  Component Value Date   HGBA1C 6.2 (H) 01/05/2020   Weight loss: Patient has lost 20 pounds in the last 4 months Wt  Readings from Last 5 Encounters:  04/07/20 150 lb 1.6 oz (68.1 kg)  01/05/20 162 lb 4.8 oz (73.6 kg)  12/25/19 155 lb (70.3 kg)  11/24/19 170 lb (77.1 kg)  07/20/19 170 lb (77.1 kg)   BMI Readings from Last 5 Encounters:  04/07/20 27.45 kg/m  01/05/20 29.69 kg/m  01/01/20 28.35 kg/m  12/25/19 28.35 kg/m  11/24/19 31.09 kg/m     Patient Active Problem List   Diagnosis Date Noted  . Vitamin D deficiency 07/20/2019  . 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HCC) 07/20/2019  . At high risk for falls 07/20/2019  . Mass on back 01/21/2019  . Fatigue 01/15/2019  . Parkinson disease  (HCC) 01/15/2019  . Hyperlipidemia 01/15/2019  . Diabetes mellitus type 2 in obese (HCC) 01/13/2019  . Goiter 01/13/2019  . Overactive bladder 01/13/2019  . Obesity (BMI 30.0-34.9) 01/13/2019  . Medication monitoring encounter 01/13/2019  . Gait abnormality 01/13/2019      Current Outpatient Medications:  .  aspirin EC 81 MG tablet, Take 1 tablet (81 mg total) by mouth daily. (take at least one hour prior to any ibuprofen), Disp: , Rfl:  .  atorvastatin (LIPITOR) 40 MG tablet, TAKE 1 TABLET BY MOUTH AT BEDTIME, Disp: 90 tablet, Rfl: 0 .  buPROPion (WELLBUTRIN XL) 150 MG 24 hr tablet, TAKE 1 TABLET(150 MG) BY MOUTH DAILY, Disp: 90 tablet, Rfl: 1 .  carbidopa-levodopa (SINEMET IR) 25-100 MG tablet, Take 1.5 pills three times a day for one week then increase to 2 pills three times a day, Disp: , Rfl:  .  metFORMIN (GLUCOPHAGE-XR) 500 MG 24 hr tablet, TAKE 1 TABLET(500 MG) BY MOUTH Once daily, Disp: 90 tablet, Rfl: 3 .  mirabegron ER (MYRBETRIQ) 25 MG TB24 tablet, Take 1 tablet (25 mg total) by mouth daily., Disp: 30 tablet, Rfl: 11 .  rOPINIRole (REQUIP) 0.25 MG tablet, , Disp: , Rfl:  .  solifenacin (VESICARE) 5 MG tablet, Take 1 tablet (5 mg total) by mouth daily., Disp: 30 tablet, Rfl: 11 .  vitamin B-12 (CYANOCOBALAMIN) 100 MCG tablet, Take 100 mcg by mouth daily., Disp: , Rfl:  .  Vitamin D, Ergocalciferol, (DRISDOL) 1.25 MG (50000 UT) CAPS capsule, Take by mouth., Disp: , Rfl:    No Known Allergies   Family History  Problem Relation Age of Onset  . Stroke Mother   . Hypertension Mother   . Diabetes Father   . Heart disease Father   . Lung cancer Father   . Colon cancer Sister   . Diabetes Sister   . Kidney disease Daughter   . Kidney cancer Daughter   . Pulmonary embolism Sister   . Diabetes Sister   . Kidney disease Sister   . Heart disease Sister   . Atrial fibrillation Sister   . Diabetes Brother   . Heart disease Brother   . AAA (abdominal aortic aneurysm) Brother    . Cancer Brother        skin     Social History   Socioeconomic History  . Marital status: Widowed    Spouse name: Fayrene FearingJames  . Number of children: 2  . Years of education: 3812  . Highest education level: Some college, no degree  Occupational History  . Occupation: retired [employer] affairs [employer] system  Tobacco Use  . Smoking status: Former Smoker    Packs/day: 2.00    Years: 40.00    Pack years: 80.00    Types: Cigarettes    Quit date: 12/10/2018  Years since quitting: 1.3  . Smokeless tobacco: Never Used  Substance and Sexual Activity  . Alcohol use: Never  . Drug use: Never  . Sexual activity: Not Currently  Other Topics Concern  . Not on file  Social History Narrative   Lost her husband of 57 years in oct 2019. Moved back from [place] in Nov 2019   Social Determinants of Health   Financial Resource Strain:   . Difficulty of Paying Living Expenses:   Food Insecurity:   . Worried About Programme researcher, broadcasting/film/video in the Last Year:   . Barista in the Last Year:   Transportation Needs:   . Freight forwarder (Medical):   Marland Kitchen Lack of Transportation (Non-Medical):   Physical Activity: Inactive  . Days of Exercise per Week: 0 days  . Minutes of Exercise per Session: 0 min  Stress: Stress Concern Present  . Feeling of Stress : To some extent  Social Connections:   . Frequency of Communication with Friends and Family:   . Frequency of Social Gatherings with Friends and Family:   . Attends Religious Services:   . Active Member of Clubs or Organizations:   . Attends Banker Meetings:   Marland Kitchen Marital Status:   Intimate Partner Violence:   . Fear of Current or Ex-Partner:   . Emotionally Abused:   Marland Kitchen Physically Abused:   . Sexually Abused:     Chart Review Today: I personally reviewed active problem list, medication list, allergies, family history, social history, health maintenance, notes from last encounter, lab results, imaging with the patient/caregiver  today.   Review of Systems 10 Systems reviewed and are negative for acute change except as noted in the HPI.     Objective:   Vitals:   04/07/20 1037  BP: 118/72  Pulse: 97  Resp: 14  Temp: 98.3 F (36.8 C)  SpO2: 97%  Weight: 150 lb 1.6 oz (68.1 kg)  Height: 5\' 2"  (1.575 m)    Body mass index is 27.45 kg/m.  Physical Exam Vitals and nursing note reviewed.  Constitutional:      General: She is not in acute distress.    Appearance: She is well-developed. She is not ill-appearing, toxic-appearing or diaphoretic.     Interventions: Face mask in place.  HENT:     Head: Normocephalic and atraumatic.     Right Ear: External ear normal.     Left Ear: External ear normal.  Eyes:     General: Lids are normal. No scleral icterus.       Right eye: No discharge.        Left eye: No discharge.     Conjunctiva/sclera: Conjunctivae normal.  Neck:     Trachea: Phonation normal. No tracheal deviation.  Cardiovascular:     Rate and Rhythm: Normal rate and regular rhythm.     Pulses: Normal pulses.          Radial pulses are 2+ on the right side and 2+ on the left side.       Posterior tibial pulses are 2+ on the right side and 2+ on the left side.     Heart sounds: Normal heart sounds. No murmur. No friction rub. No gallop.   Pulmonary:     Effort: Pulmonary effort is normal. No respiratory distress.     Breath sounds: Normal breath sounds. No stridor. No wheezing, rhonchi or rales.  Chest:     Chest wall: No tenderness.  Abdominal:     General: Bowel sounds are normal. There is no distension.     Palpations: Abdomen is soft.  Musculoskeletal:     Right lower leg: No edema.     Left lower leg: No edema.  Skin:    General: Skin is warm and dry.     Capillary Refill: Capillary refill takes less than 2 seconds.     Coloration: Skin is not jaundiced or pale.     Findings: No rash.  Neurological:     Mental Status: She is alert.     Motor: No abnormal muscle tone.     Gait:  Gait abnormal.  Psychiatric:        Mood and Affect: Mood normal.        Speech: Speech normal.        Behavior: Behavior normal.      Results for orders placed or performed in visit on 01/05/20  COMPLETE METABOLIC PANEL WITH GFR  Result Value Ref Range   Glucose, Bld 204 (H) 65 - 99 mg/dL   BUN 13 7 - 25 mg/dL   Creat 0.67 0.60 - 0.93 mg/dL   GFR, Est Non African American 84 > OR = 60 mL/min/1.43m2   GFR, Est African American 98 > OR = 60 mL/min/1.50m2   BUN/Creatinine Ratio NOT APPLICABLE 6 - 22 (calc)   Sodium 139 135 - 146 mmol/L   Potassium 4.1 3.5 - 5.3 mmol/L   Chloride 102 98 - 110 mmol/L   CO2 27 20 - 32 mmol/L   Calcium 9.2 8.6 - 10.4 mg/dL   Total Protein 6.3 6.1 - 8.1 g/dL   Albumin 3.6 3.6 - 5.1 g/dL   Globulin 2.7 1.9 - 3.7 g/dL (calc)   AG Ratio 1.3 1.0 - 2.5 (calc)   Total Bilirubin 0.8 0.2 - 1.2 mg/dL   Alkaline phosphatase (APISO) 153 37 - 153 U/L   AST 40 (H) 10 - 35 U/L   ALT 27 6 - 29 U/L  Hemoglobin A1c  Result Value Ref Range   Hgb A1c MFr Bld 6.2 (H) <5.7 % of total Hgb   Mean Plasma Glucose 131 (calc)   eAG (mmol/L) 7.3 (calc)  VITAMIN D 25 Hydroxy (Vit-D Deficiency, Fractures)  Result Value Ref Range   Vit D, 25-Hydroxy 31 30 - 100 ng/mL  CBC with Differential/Platelet  Result Value Ref Range   WBC 5.1 3.8 - 10.8 Thousand/uL   RBC 4.45 3.80 - 5.10 Million/uL   Hemoglobin 12.2 11.7 - 15.5 g/dL   HCT 38.6 35.0 - 45.0 %   MCV 86.7 80.0 - 100.0 fL   MCH 27.4 27.0 - 33.0 pg   MCHC 31.6 (L) 32.0 - 36.0 g/dL   RDW 15.3 (H) 11.0 - 15.0 %   Platelets 160 140 - 400 Thousand/uL   MPV 11.5 7.5 - 12.5 fL   Neutro Abs 3,698 1,500 - 7,800 cells/uL   Lymphs Abs 867 850 - 3,900 cells/uL   Absolute Monocytes 352 200 - 950 cells/uL   Eosinophils Absolute 153 15 - 500 cells/uL   Basophils Absolute 31 0 - 200 cells/uL   Neutrophils Relative % 72.5 %   Total Lymphocyte 17.0 %   Monocytes Relative 6.9 %   Eosinophils Relative 3.0 %   Basophils Relative  0.6 %    Wt Readings from Last 5 Encounters:  04/07/20 150 lb 1.6 oz (68.1 kg)  01/05/20 162 lb 4.8 oz (73.6 kg)  12/25/19 155 lb (70.3 kg)  11/24/19 170 lb (77.1 kg)  07/20/19 170 lb (77.1 kg)   BMI Readings from Last 5 Encounters:  04/07/20 27.45 kg/m  01/05/20 29.69 kg/m  01/01/20 28.35 kg/m  12/25/19 28.35 kg/m  11/24/19 31.09 kg/m         Assessment & Plan:      ICD-10-CM   1. Acute conjunctivitis of left eye, unspecified acute conjunctivitis type  H10.32    dry eye secondary to Parkinson's with decreased blinking versus allergic conjunctivitis Did write out several over-the-counter eyedrops that she could try including blink gel eye drops for hydration, pataday OTC drops for allergies  F/up if worsening - does not currently appear to be concerning for bacterial infection   2. Current mild episode of major depressive disorder, unspecified whether recurrent (HCC)  F32.0    Moods fairly well controlled with Wellbutrin 150 mg daily  3. At high risk for falls  Z91.81   4. Malnutrition, unspecified type (HCC)  E46    20 lb weight loss in 4 months, >11%, counseled on nutrition, protein in diet, boost in addition to other meals  5. Parkinson disease [facility])  G20    Per neurology, Dr. Sherryll Burger  6. Gait abnormality  Working with HH PTOT and nuero R26.9   7. Leg weakness, bilateral  R29.898   8. Impaired mobility and ADLs  Z74.09    Z78.9    Working with neurology and home health PT and OT recently had evaluation for further needs  9. Estrogen deficiency  E28.39 DG Bone Density   Due for bone scan, history of vitamin D deficiency, with other health concerns patient may reasonably delay dexa and mammogram while tending to other needs  10. Encounter for screening mammogram for breast cancer  Z12.31 MM 3D SCREEN BREAST BILATERAL     Danelle Berry, PA-C 04/07/20 10:50 AM

## 2020-04-15 DIAGNOSIS — Z789 Other specified health status: Secondary | ICD-10-CM | POA: Insufficient documentation

## 2020-04-15 DIAGNOSIS — E46 Unspecified protein-calorie malnutrition: Secondary | ICD-10-CM | POA: Insufficient documentation

## 2020-04-15 DIAGNOSIS — Z7409 Other reduced mobility: Secondary | ICD-10-CM | POA: Insufficient documentation

## 2020-04-15 DIAGNOSIS — R29898 Other symptoms and signs involving the musculoskeletal system: Secondary | ICD-10-CM | POA: Insufficient documentation

## 2020-04-18 ENCOUNTER — Other Ambulatory Visit: Payer: Self-pay | Admitting: Neurology

## 2020-04-18 ENCOUNTER — Other Ambulatory Visit (HOSPITAL_COMMUNITY): Payer: Self-pay | Admitting: Neurology

## 2020-04-18 DIAGNOSIS — R269 Unspecified abnormalities of gait and mobility: Secondary | ICD-10-CM

## 2020-04-20 ENCOUNTER — Other Ambulatory Visit: Payer: Self-pay

## 2020-05-04 ENCOUNTER — Ambulatory Visit: Admitting: Family Medicine

## 2020-05-06 ENCOUNTER — Other Ambulatory Visit: Payer: Self-pay

## 2020-05-06 ENCOUNTER — Ambulatory Visit (HOSPITAL_COMMUNITY)
Admission: RE | Admit: 2020-05-06 | Discharge: 2020-05-06 | Disposition: A | Discharge status: Home / Self Care | Payer: Medicare PPO | Source: Ambulatory Visit | Attending: Neurology | Admitting: Neurology

## 2020-05-06 DIAGNOSIS — R269 Unspecified abnormalities of gait and mobility: Secondary | ICD-10-CM | POA: Insufficient documentation

## 2020-05-26 ENCOUNTER — Other Ambulatory Visit: Payer: Self-pay

## 2020-05-26 ENCOUNTER — Ambulatory Visit (INDEPENDENT_AMBULATORY_CARE_PROVIDER_SITE_OTHER): Payer: Medicare PPO

## 2020-05-26 VITALS — BP 132/82 | HR 84 | Resp 16 | Ht 62.0 in | Wt 147.3 lb

## 2020-05-26 DIAGNOSIS — Z0000 Encounter for general adult medical examination without abnormal findings: Secondary | ICD-10-CM

## 2020-05-26 NOTE — Patient Instructions (Signed)
Whitney [name] , Thank you for taking time to come for your Medicare Wellness Visit. I appreciate your ongoing commitment to your health goals. Please review the following plan we discussed and let me know if I can assist you in the future.   Screening recommendations/referrals: Colonoscopy: no longer required Mammogram: done 03/15/17. Please call [phone] to schedule your mammogram and bone density screening.   Recommended yearly ophthalmology/optometry visit for glaucoma screening and checkup Recommended yearly dental visit for hygiene and checkup  Vaccinations: Influenza vaccine: done 09/12/19 Pneumococcal vaccine: done 2017 Tdap vaccine: due Shingles vaccine: Shingrix discussed. Please contact your pharmacy for coverage information.  Covid-19:done 12/31/19 & 01/21/20  Advanced directives: Please bring a copy of your health care power of attorney and living will to the office at your convenience.  Conditions/risks identified: Recommend preventing falls with strength training and balance exercises.   Next appointment: Follow up in one year for your annual wellness visit    Preventive Care 65 Years and Older, Female Preventive care refers to lifestyle choices and visits with your health care provider that can promote health and wellness. What does preventive care include?  A yearly physical exam. This is also called an annual well check.  Dental exams once or twice a year.  Routine eye exams. Ask your health care provider how often you should have your eyes checked.  Personal lifestyle choices, including:  Daily care of your teeth and gums.  Regular physical activity.  Eating a healthy diet.  Avoiding tobacco and drug use.  Limiting alcohol use.  Practicing safe sex.  Taking low-dose aspirin every day.  Taking vitamin and mineral supplements as recommended by your health care provider. What happens during an annual well check? The services and screenings done by your  health care provider during your annual well check will depend on your age, overall health, lifestyle risk factors, and family history of disease. Counseling  Your health care provider may ask you questions about your:  Alcohol use.  Tobacco use.  Drug use.  Emotional well-being.  Home and relationship well-being.  Sexual activity.  Eating habits.  History of falls.  Memory and ability to understand (cognition).  Work and work Astronomer.  Reproductive health. Screening  You may have the following tests or measurements:  Height, weight, and BMI.  Blood pressure.  Lipid and cholesterol levels. These may be checked every 5 years, or more frequently if you are over 64 years old.  Skin check.  Lung cancer screening. You may have this screening every year starting at age 87 if you have a 30-pack-year history of smoking and currently smoke or have quit within the past 15 years.  Fecal occult blood test (FOBT) of the stool. You may have this test every year starting at age 4.  Flexible sigmoidoscopy or colonoscopy. You may have a sigmoidoscopy every 5 years or a colonoscopy every 10 years starting at age 25.  Hepatitis C blood test.  Hepatitis B blood test.  Sexually transmitted disease (STD) testing.  Diabetes screening. This is done by checking your blood sugar (glucose) after you have not eaten for a while (fasting). You may have this done every 1-3 years.  Bone density scan. This is done to screen for osteoporosis. You may have this done starting at age 53.  Mammogram. This may be done every 1-2 years. Talk to your health care provider about how often you should have regular mammograms. Talk with your health care provider about your test results, treatment  options, and if necessary, the need for more tests. Vaccines  Your health care provider may recommend certain vaccines, such as:  Influenza vaccine. This is recommended every year.  Tetanus, diphtheria, and  acellular pertussis (Tdap, Td) vaccine. You may need a Td booster every 10 years.  Zoster vaccine. You may need this after age 35.  Pneumococcal 13-valent conjugate (PCV13) vaccine. One dose is recommended after age 64.  Pneumococcal polysaccharide (PPSV23) vaccine. One dose is recommended after age 9. Talk to your health care provider about which screenings and vaccines you need and how often you need them. This information is not intended to replace advice given to you by your health care provider. Make sure you discuss any questions you have with your health care provider. Document Released: 12/23/2015 Document Revised: 08/15/2016 Document Reviewed: 09/27/2015 Elsevier Interactive Patient Education  2017 [place] Prevention in the Home Falls can cause injuries. They can happen to people of all ages. There are many things you can do to make your home safe and to help prevent falls. What can I do on the outside of my home?  Regularly fix the edges of walkways and driveways and fix any cracks.  Remove anything that might make you trip as you walk through a door, such as a raised step or threshold.  Trim any bushes or trees on the path to your home.  Use bright outdoor lighting.  Clear any walking paths of anything that might make someone trip, such as rocks or tools.  Regularly check to see if handrails are loose or broken. Make sure that both sides of any steps have handrails.  Any raised decks and porches should have guardrails on the edges.  Have any leaves, snow, or ice cleared regularly.  Use sand or salt on walking paths during winter.  Clean up any spills in your garage right away. This includes oil or grease spills. What can I do in the bathroom?  Use night lights.  Install grab bars by the toilet and in the tub and shower. Do not use towel bars as grab bars.  Use non-skid mats or decals in the tub or shower.  If you need to sit down in the shower, use  a plastic, non-slip stool.  Keep the floor dry. Clean up any water that spills on the floor as soon as it happens.  Remove soap buildup in the tub or shower regularly.  Attach bath mats securely with double-sided non-slip rug tape.  Do not have throw rugs and other things on the floor that can make you trip. What can I do in the bedroom?  Use night lights.  Make sure that you have a light by your bed that is easy to reach.  Do not use any sheets or blankets that are too big for your bed. They should not hang down onto the floor.  Have a firm chair that has side arms. You can use this for support while you get dressed.  Do not have throw rugs and other things on the floor that can make you trip. What can I do in the kitchen?  Clean up any spills right away.  Avoid walking on wet floors.  Keep items that you use a lot in easy-to-reach places.  If you need to reach something above you, use a strong step stool that has a grab bar.  Keep electrical cords out of the way.  Do not use floor polish or wax that makes floors slippery.  If you must use wax, use non-skid floor wax.  Do not have throw rugs and other things on the floor that can make you trip. What can I do with my stairs?  Do not leave any items on the stairs.  Make sure that there are handrails on both sides of the stairs and use them. Fix handrails that are broken or loose. Make sure that handrails are as long as the stairways.  Check any carpeting to make sure that it is firmly attached to the stairs. Fix any carpet that is loose or worn.  Avoid having throw rugs at the top or bottom of the stairs. If you do have throw rugs, attach them to the floor with carpet tape.  Make sure that you have a light switch at the top of the stairs and the bottom of the stairs. If you do not have them, ask someone to add them for you. What else can I do to help prevent falls?  Wear shoes that:  Do not have high heels.  Have  rubber bottoms.  Are comfortable and fit you well.  Are closed at the toe. Do not wear sandals.  If you use a stepladder:  Make sure that it is fully opened. Do not climb a closed stepladder.  Make sure that both sides of the stepladder are locked into place.  Ask someone to hold it for you, if possible.  Clearly mark and make sure that you can see:  Any grab bars or handrails.  First and last steps.  Where the edge of each step is.  Use tools that help you move around (mobility aids) if they are needed. These include:  Canes.  Walkers.  Scooters.  Crutches.  Turn on the lights when you go into a dark area. Replace any light bulbs as soon as they burn out.  Set up your furniture so you have a clear path. Avoid moving your furniture around.  If any of your floors are uneven, fix them.  If there are any pets around you, be aware of where they are.  Review your medicines with your doctor. Some medicines can make you feel dizzy. This can increase your chance of falling. Ask your doctor what other things that you can do to help prevent falls. This information is not intended to replace advice given to you by your health care provider. Make sure you discuss any questions you have with your health care provider. Document Released: 09/22/2009 Document Revised: 05/03/2016 Document Reviewed: 12/31/2014 Elsevier Interactive Patient Education  2017 [employer].

## 2020-05-26 NOTE — Progress Notes (Addendum)
Subjective:   Whitney [name] is a 79 y.o. female who presents for Medicare Annual (Subsequent) preventive examination.  Review of Systems:   Cardiac Risk Factors include: advanced age (>18mn, >>81women);dyslipidemia;diabetes mellitus;hypertension;sedentary lifestyle     Objective:     Vitals: BP 132/82   Pulse 84   Resp 16   Ht '5\' 2"'  (1.575 m)   Wt 147 lb 4.8 oz (66.8 kg)   SpO2 98%   BMI 26.94 kg/m   Body mass index is 26.94 kg/m.  Advanced Directives 05/26/2020 08/12/2019 05/26/2019 11/21/2018  Does Patient Have a Medical Advance Directive? Yes Yes No Yes  Type of [place] - Living will;Healthcare Power of Attorney  Does patient want to make changes to medical advance directive? - No - Patient declined - -  Copy of [place] Chart? No - copy requested No - copy requested - No - copy requested  Would patient like information on creating a medical advance directive? - - Yes (MAU/Ambulatory/Procedural Areas - Information given) -    Tobacco Social History   Tobacco Use  Smoking Status Former Smoker  . Packs/day: 2.00  . Years: 40.00  . Pack years: 80.00  . Types: Cigarettes  . Quit date: 12/10/2018  . Years since quitting: 1.4  Smokeless Tobacco Never Used     Counseling given: Not Answered   Clinical Intake:  Pre-visit preparation completed: Yes  Pain : 0-10 Pain Score: 8  Pain Type: Chronic pain Pain Location: Arm Pain Orientation: Right Pain Descriptors / Indicators: Aching, Sore Pain Onset: More than a month ago Pain Frequency: Constant     BMI - recorded: 26.94 Nutritional Status: BMI 25 -29 Overweight Nutritional Risks: None Diabetes: Yes CBG done?: No Did pt. bring in CBG monitor from home?: No   Nutrition Risk Assessment:  Has the patient had any N/V/D within the last 2 months?  No  Does the patient have any non-healing wounds?   No  Has the patient had any unintentional weight loss or weight gain?  No   Diabetes:  Is the patient diabetic?  Yes  If diabetic, was a CBG obtained today?  No  Did the patient bring in their glucometer from home?  No  How often do you monitor your CBG's? Occasionally per patient.   Financial Strains and Diabetes Management:  Are you having any financial strains with the device, your supplies or your medication? No .  Does the patient want to be seen by Chronic Care Management for management of their diabetes?  No  Would the patient like to be referred to a Nutritionist or for Diabetic Management?  No   Diabetic Exams:  Diabetic Eye Exam: Completed 09/08/19 negative retinopathy.   Diabetic Foot Exam: Completed 07/20/19.    How often do you need to have someone help you when you read instructions, pamphlets, or other written materials from your doctor or pharmacy?: 1 - Never  Interpreter Needed?: No  Information entered by :: KClemetine MarkerLPN  Past Medical History:  Diagnosis Date  . Arthritis   . Broken shoulder   . Broken shoulder   . COVID-19   . Depression   . Diabetes mellitus without complication ([place]   . Goiter   . Hyperlipidemia   . Overactive bladder   . Parkinson's disease ([place]   . Unsteady gait    Past Surgical History:  Procedure Laterality Date  . ABDOMINAL  HYSTERECTOMY    . BLADDER REPAIR    . NECK SURGERY     Family History  Problem Relation Age of Onset  . Stroke Mother   . Hypertension Mother   . Diabetes Father   . Heart disease Father   . Lung cancer Father   . Colon cancer Sister   . Diabetes Sister   . Kidney disease Daughter   . Kidney cancer Daughter   . Pulmonary embolism Sister   . Diabetes Sister   . Kidney disease Sister   . Heart disease Sister   . Atrial fibrillation Sister   . Diabetes Brother   . Heart disease Brother   . AAA (abdominal aortic aneurysm) Brother   . Cancer Brother        skin   Social History    Socioeconomic History  . Marital status: Widowed    Spouse name: Jeneen Rinks  . Number of children: 2  . Years of education: 48  . Highest education level: Some college, no degree  Occupational History  . Occupation: retired Conservation officer, historic buildings  Tobacco Use  . Smoking status: Former Smoker    Packs/day: 2.00    Years: 40.00    Pack years: 80.00    Types: Cigarettes    Quit date: 12/10/2018    Years since quitting: 1.4  . Smokeless tobacco: Never Used  Vaping Use  . Vaping Use: Never used  Substance and Sexual Activity  . Alcohol use: Never  . Drug use: Never  . Sexual activity: Not Currently  Other Topics Concern  . Not on file  Social History Narrative   Lost her husband of 24 years in oct 2019. Moved back from [place] in Nov 2019   Social Determinants of Health   Financial Resource Strain: Low Risk   . Difficulty of Paying Living Expenses: Not hard at all  Food Insecurity: No Food Insecurity  . Worried About Charity fundraiser in the Last Year: Never true  . Ran Out of Food in the Last Year: Never true  Transportation Needs: No Transportation Needs  . Lack of Transportation (Medical): No  . Lack of Transportation (Non-Medical): No  Physical Activity: Inactive  . Days of Exercise per Week: 0 days  . Minutes of Exercise per Session: 0 min  Stress: No Stress Concern Present  . Feeling of Stress : Not at all  Social Connections: Socially Isolated  . Frequency of Communication with Friends and Family: More than three times a week  . Frequency of Social Gatherings with Friends and Family: More than three times a week  . Attends Religious Services: Never  . Active Member of Clubs or Organizations: No  . Attends Archivist Meetings: Never  . Marital Status: Widowed    Outpatient Encounter Medications as of 05/26/2020  Medication Sig  . ACCU-CHEK GUIDE test strip   . Accu-Chek Softclix Lancets lancets   . Alcohol Swabs (B-D SINGLE USE SWABS REGULAR) PADS   .  aspirin EC 81 MG tablet Take 1 tablet (81 mg total) by mouth daily. (take at least one hour prior to any ibuprofen)  . atorvastatin (LIPITOR) 40 MG tablet TAKE 1 TABLET BY MOUTH AT BEDTIME  . Blood Glucose Monitoring Suppl (ACCU-CHEK GUIDE ME) w/Device KIT   . buPROPion (WELLBUTRIN XL) 150 MG 24 hr tablet TAKE 1 TABLET(150 MG) BY MOUTH DAILY  . metFORMIN (GLUCOPHAGE-XR) 500 MG 24 hr tablet TAKE 1 TABLET(500 MG) BY MOUTH Once daily  . mirabegron  ER (MYRBETRIQ) 25 MG TB24 tablet Take 1 tablet (25 mg total) by mouth daily.  Marland Kitchen rOPINIRole (REQUIP) 0.25 MG tablet   . solifenacin (VESICARE) 5 MG tablet Take 1 tablet (5 mg total) by mouth daily.  . vitamin B-12 (CYANOCOBALAMIN) 100 MCG tablet Take 100 mcg by mouth daily.  . [DISCONTINUED] carbidopa-levodopa (SINEMET IR) 25-100 MG tablet Take 1.5 pills three times a day for one week then increase to 2 pills three times a day  . [DISCONTINUED] Olopatadine HCl 0.2 % SOLN Apply 1 drop to eye daily. To bilateral eyes daily prn for allergies/itching  . [DISCONTINUED] Vitamin D, Ergocalciferol, (DRISDOL) 1.25 MG (50000 UT) CAPS capsule Take by mouth.   No facility-administered encounter medications on file as of 05/26/2020.    Activities of Daily Living In your present state of health, do you have any difficulty performing the following activities: 05/26/2020 04/07/2020  Hearing? N N  Comment declines hearing aids -  Vision? Y Y  Difficulty concentrating or making decisions? Tempie Donning  Walking or climbing stairs? Y Y  Dressing or bathing? Y Y  Doing errands, shopping? Tempie Donning  Preparing Food and eating ? Y -  Comment receives meals on wheels and can heat up food -  Using the Toilet? N -  In the past six months, have you accidently leaked urine? N -  Do you have problems with loss of bowel control? N -  Managing your Medications? N -  Managing your Finances? N -  Housekeeping or managing your Housekeeping? N -  Some recent data might be hidden    Patient Care  Team: Delsa Grana, PA-C as PCP - General (Family Medicine) Vladimir Crofts, MD as Consulting Physician (Neurology) Clyde Canterbury, MD as Referring Physician (Otolaryngology) Festus Aloe, MD as Consulting Physician (Urology)    Assessment:   This is a routine wellness examination for [place].  Exercise Activities and Dietary recommendations Current Exercise Habits: The patient does not participate in regular exercise at present, Exercise limited by: orthopedic condition(s);neurologic condition(s)  Goals    . DIET - INCREASE WATER INTAKE     Recommend drinking 6-8 glasses of water per day     . Prevent falls     Pt would like to prevent falls with physical and occupational therapy and safety prevention in the home       Fall Risk Fall Risk  05/26/2020 04/07/2020 01/05/2020 12/25/2019 11/24/2019  Falls in the past year? '1 1 1 1 1  ' Number falls in past yr: '1 1 1 1 1  ' Injury with Fall? '1 1 1 1 1  ' Risk for fall due to : Orthopedic patient;Impaired vision;Impaired mobility;Impaired balance/gait;History of fall(s) History of fall(s);Impaired balance/gait;Impaired mobility;Mental status change - - -  Follow up Falls prevention discussed - - - -   FALL RISK PREVENTION PERTAINING TO THE HOME:  Any stairs in or around the home? Yes  If so, are there any without handrails? Yes   Home free of loose throw rugs in walkways, pet beds, electrical cords, etc? Yes  Adequate lighting in your home to reduce risk of falls? Yes   ASSISTIVE DEVICES UTILIZED TO PREVENT FALLS:  Life alert? Yes  Use of a cane, walker or w/c? Yes  Grab bars in the bathroom? Yes  Shower chair or bench in shower? Yes  Elevated toilet seat or a handicapped toilet? Yes   DME ORDERS:  DME order needed?  No   TIMED UP AND GO:  Was the  test performed? Yes .  Length of time to ambulate 10 feet: 15 sec.   GAIT:  Appearance of gait: Gait slow and steady with the use of an assistive device.   Education: Fall  risk prevention has been discussed.  Intervention(s) required? No    Depression Screen PHQ 2/9 Scores 05/26/2020 04/07/2020 01/05/2020 12/25/2019  PHQ - 2 Score 0 0 1 0  PHQ- 9 Score - 0 8 0     Cognitive Function - Pt declined 6CIT for 2021 AWV     6CIT Screen 05/26/2019  What Year? 0 points  What month? 0 points  What time? 0 points  Count back from 20 0 points  Months in reverse 0 points  Repeat phrase 2 points  Total Score 2    Immunization History  Administered Date(s) Administered  . Influenza,inj,Quad PF,6+ Mos 09/12/2019  . PFIZER SARS-COV-2 Vaccination 12/31/2019, 01/21/2020  . Pneumococcal Conjugate-13 12/11/2015    Qualifies for Shingles Vaccine? Yes  . Due for Shingrix. Education has been provided regarding the importance of this vaccine. Pt has been advised to call insurance company to determine out of pocket expense. Advised may also receive vaccine at local pharmacy or Health Dept. Verbalized acceptance and understanding.  Tdap: Although this vaccine is not a covered service during a Wellness Exam, does the patient still wish to receive this vaccine today?  No .  Education has been provided regarding the importance of this vaccine. Advised may receive this vaccine at local pharmacy or Health Dept. Aware to provide a copy of the vaccination record if obtained from local pharmacy or Health Dept. Verbalized acceptance and understanding.  Flu Vaccine: Up to date  Pneumococcal Vaccine: Due for Pneumococcal vaccine. Does the patient want to receive this vaccine today?  No . Education has been provided regarding the importance of this vaccine but still declined. Advised may receive this vaccine at local pharmacy or Health Dept. Aware to provide a copy of the vaccination record if obtained from local pharmacy or Health Dept. Verbalized acceptance and understanding.  Covid-19 Vaccine: Up to date  Screening Tests Health Maintenance  Topic Date Due  . Hepatitis C Screening   Never done  . DEXA SCAN  Never done  . MAMMOGRAM  03/15/2018  . FOOT EXAM  01/14/2020  . URINE MICROALBUMIN  01/14/2020  . TETANUS/TDAP  04/07/2021 (Originally 09/13/1960)  . PNA vac Low Risk Adult (2 of 2 - PPSV23) 04/15/2021 (Originally 12/10/2016)  . HEMOGLOBIN A1C  07/04/2020  . INFLUENZA VACCINE  07/10/2020  . OPHTHALMOLOGY EXAM  09/07/2020  . COVID-19 Vaccine  Completed    Cancer Screenings:  Colorectal Screening: No longer required.   Mammogram: Completed 03/15/17. Repeat every year. Ordered 04/07/20. Pt provided with contact information and advised to call to schedule appt.   Bone Density:DUE. Ordered 04/07/20. Pt provided with contact information and advised to call to schedule appt.   Lung Cancer Screening: (Low Dose CT Chest recommended if Age 26-80 years, 30 pack-year currently smoking OR have quit w/in 15years.) does qualify.   Lung Cancer Screening Referral: An Epic message has been sent to Burgess Estelle, RN (Oncology Nurse Navigator) regarding the possible need for this exam. Raquel Sarna will review the patient's chart to determine if the patient truly qualifies for the exam. If the patient qualifies, Raquel Sarna will order the Low Dose CT of the chest to facilitate the scheduling of this exam.  Additional Screening:  Hepatitis C Screening: does qualify; postponed  Vision Screening: Recommended annual ophthalmology  exams for early detection of glaucoma and other disorders of the eye. Is the patient up to date with their annual eye exam?  Yes  Who is the provider or what is the name of the office in which the pt attends annual eye exams? [place] Screening: Recommended annual dental exams for proper oral hygiene  Community Resource Referral:  CRR required this visit?  No      Plan:     I have personally reviewed and addressed the Medicare Annual Wellness questionnaire and have noted the following in the patient's chart:  A. Medical and social history B. Use  of alcohol, tobacco or illicit drugs  C. Current medications and supplements D. Functional ability and status E.  Nutritional status F.  Physical activity G. Advance directives H. List of other physicians I.  Hospitalizations, surgeries, and ER visits in previous 12 months J.  [place] such as hearing and vision if needed, cognitive and depression L. Referrals and appointments   In addition, I have reviewed and discussed with patient certain preventive protocols, quality metrics, and best practice recommendations. A written personalized care plan for preventive services as well as general preventive health recommendations were provided to patient.   Signed,  Clemetine Marker, LPN Nurse Health Advisor   Nurse Notes: pt c/o of frequent falls over the last few months. She completed approx 6 weeks of physical and occupational therapy with [facility]. Patient states therapy was helpful as she has not had any falls within the last 2 weeks. She is currently being reevaluated for continuation of therapy. Pt lives alone and has multiple assistive devices at home but still concerned about falling. She does wear life alert. Pt accompanied to visit today by her sister, Myrtie Hawk who assists with ADL's when needed.   Pt also c/o 8/10 pain in right arm since falling in December. Pt states tylenol/ibuprofen no longer helping with pain and wanted to know what else she may be able to take. Pt reports ortho would not prescribe any pain meds. Advised patient to follow up at next visit with Lesia on 07/07/20 or schedule earlier appt if needed

## 2020-06-07 LAB — HM DIABETES EYE EXAM

## 2020-06-09 DIAGNOSIS — I700 Atherosclerosis of aorta: Secondary | ICD-10-CM | POA: Insufficient documentation

## 2020-06-09 DIAGNOSIS — J439 Emphysema, unspecified: Secondary | ICD-10-CM | POA: Insufficient documentation

## 2020-06-14 ENCOUNTER — Telehealth: Payer: Self-pay | Admitting: *Deleted

## 2020-06-14 DIAGNOSIS — Z122 Encounter for screening for malignant neoplasm of respiratory organs: Secondary | ICD-10-CM

## 2020-06-14 DIAGNOSIS — Z87891 Personal history of nicotine dependence: Secondary | ICD-10-CM

## 2020-06-14 NOTE — Telephone Encounter (Signed)
Received referral for initial lung cancer screening scan. Contacted patient and obtained smoking history,(former, quit 2019, 80 pack year) as well as answering questions related to screening process. Patient denies signs of lung cancer such as weight loss or hemoptysis. Patient denies comorbidity that would prevent curative treatment if lung cancer were found. Patient is scheduled for shared decision making visit and CT scan on 06/29/20 at 1015am.

## 2020-06-29 ENCOUNTER — Inpatient Hospital Stay: Payer: Medicare PPO | Attending: Oncology | Admitting: Hospice and Palliative Medicine

## 2020-06-29 ENCOUNTER — Ambulatory Visit
Admission: RE | Admit: 2020-06-29 | Discharge: 2020-06-29 | Disposition: A | Discharge status: Home / Self Care | Payer: Medicare PPO | Source: Ambulatory Visit | Attending: Oncology | Admitting: Oncology

## 2020-06-29 ENCOUNTER — Other Ambulatory Visit: Payer: Self-pay

## 2020-06-29 DIAGNOSIS — I2510 Atherosclerotic heart disease of native coronary artery without angina pectoris: Secondary | ICD-10-CM | POA: Insufficient documentation

## 2020-06-29 DIAGNOSIS — Z87891 Personal history of nicotine dependence: Secondary | ICD-10-CM

## 2020-06-29 DIAGNOSIS — Z122 Encounter for screening for malignant neoplasm of respiratory organs: Secondary | ICD-10-CM | POA: Diagnosis present

## 2020-06-29 DIAGNOSIS — K7460 Unspecified cirrhosis of liver: Secondary | ICD-10-CM | POA: Diagnosis not present

## 2020-06-29 DIAGNOSIS — I700 Atherosclerosis of aorta: Secondary | ICD-10-CM | POA: Diagnosis not present

## 2020-06-29 DIAGNOSIS — J439 Emphysema, unspecified: Secondary | ICD-10-CM | POA: Insufficient documentation

## 2020-06-29 NOTE — Progress Notes (Signed)
Virtual Visit via Video Note  I connected with@ on 06/29/20 at@ by a video enabled telemedicine application and verified that I am speaking with the correct person using two identifiers.   I discussed the limitations of evaluation and management by telemedicine and the availability of in person appointments. The patient expressed understanding and agreed to proceed.  In accordance with CMS guidelines, patient has met eligibility criteria including age, absence of signs or symptoms of lung cancer.  Social History   Tobacco Use  . Smoking status: Former Smoker    Packs/day: 2.00    Years: 40.00    Pack years: 80.00    Types: Cigarettes    Quit date: 12/10/2018    Years since quitting: 1.5  . Smokeless tobacco: Never Used  Vaping Use  . Vaping Use: Never used  Substance Use Topics  . Alcohol use: Never  . Drug use: Never      A shared decision-making session was conducted prior to the performance of CT scan. This includes one or more decision aids, includes benefits and harms of screening, follow-up diagnostic testing, over-diagnosis, false positive rate, and total radiation exposure.   Counseling on the importance of adherence to annual lung cancer LDCT screening, impact of co-morbidities, and ability or willingness to undergo diagnosis and treatment is imperative for compliance of the program.   Counseling on the importance of continued smoking cessation for former smokers; the importance of smoking cessation for current smokers, and information about tobacco cessation interventions have been given to patient including [place] and [place] quit [place] programs.   Written order for lung cancer screening with LDCT has been given to the patient and any and all questions have been answered to the best of my abilities.    Yearly follow up will be coordinated by Burgess Estelle, Thoracic Navigator.  Time Total: 15 minutes  Visit consisted of counseling and education dealing with  complex health screening. Greater than 50%  of this time was spent counseling and coordinating care related to the above assessment and plan.  Signed by: Altha Harm, PhD, NP-C

## 2020-07-01 ENCOUNTER — Encounter: Payer: Self-pay | Admitting: *Deleted

## 2020-07-07 ENCOUNTER — Ambulatory Visit: Payer: Medicare PPO | Admitting: Family Medicine

## 2020-07-08 ENCOUNTER — Telehealth (INDEPENDENT_AMBULATORY_CARE_PROVIDER_SITE_OTHER): Payer: Medicare PPO | Admitting: Family Medicine

## 2020-07-08 ENCOUNTER — Other Ambulatory Visit: Payer: Self-pay

## 2020-07-08 ENCOUNTER — Encounter: Payer: Self-pay | Admitting: Family Medicine

## 2020-07-08 VITALS — Ht 62.0 in | Wt 147.0 lb

## 2020-07-08 DIAGNOSIS — R269 Unspecified abnormalities of gait and mobility: Secondary | ICD-10-CM

## 2020-07-08 DIAGNOSIS — E049 Nontoxic goiter, unspecified: Secondary | ICD-10-CM

## 2020-07-08 DIAGNOSIS — G20 Parkinson's disease: Secondary | ICD-10-CM

## 2020-07-08 DIAGNOSIS — Z7409 Other reduced mobility: Secondary | ICD-10-CM

## 2020-07-08 DIAGNOSIS — R9389 Abnormal findings on diagnostic imaging of other specified body structures: Secondary | ICD-10-CM

## 2020-07-08 DIAGNOSIS — F320 Major depressive disorder, single episode, mild: Secondary | ICD-10-CM

## 2020-07-08 DIAGNOSIS — Z789 Other specified health status: Secondary | ICD-10-CM

## 2020-07-08 NOTE — Progress Notes (Signed)
Name: Whitney [name]   MRN: [id number]    DOB: 05/20/1941   Date:07/08/2020       Progress Note  Subjective:    Chief Complaint  Chief Complaint  Patient presents with  . Follow-up    Review CT lung cancer screen  . Depression  . Hyperlipidemia  . Diabetes    I connected with  [name] on 07/09/20 at 10:00 AM EDT by telephone and verified that I am speaking with the correct person using two identifiers. I discussed the limitations, risks, security and privacy concerns of performing an evaluation and management service by telephone and the availability of in person appointments. Staff also discussed with the patient that there may be a patient responsible charge related to this service.  Patient Location: home Provider Location: home office from out of town Additional Individuals present: sister  HPI  Thyroid/goiter:  Recent CT chest shows goiter, some tracheal shift, no significant difference from 2020 testing. She is having some voice changes and she has choked on solids foods, she feels like the goiter has gotten bigger  - used to be the size of two fingers and now its the size of her palm. Dr. Richardson Landry at [facility] - she saw him last year and he wanted to schedule her for surgery - pt deferred due to covid pandemic, they told her to f/up when she was ready to schedule.   MDD - on wellbutrin 150 mg daily Doing well with meds, PHQ reviewed, no concerns or SE Depression screen [facility] 2/9 07/08/2020 05/26/2020 04/07/2020 01/05/2020 12/25/2019  Decreased Interest 0 0 0 0 0  Down, Depressed, Hopeless 1 0 0 1 0  PHQ - 2 Score 1 0 0 1 0  Altered sleeping 0 - 0 1 0  Tired, decreased energy 0 - 0 3 0  Change in appetite 0 - 0 1 0  Feeling bad or failure about yourself  0 - 0 2 0  Trouble concentrating 0 - 0 0 0  Moving slowly or fidgety/restless 0 - 0 0 0  Suicidal thoughts 0 - 0 0 0  PHQ-9 Score 1 - 0 8 0  Difficult doing work/chores Not difficult at all - Not difficult at  all Somewhat difficult Not difficult at all  Some recent data might be hidden   P[place]kinsons, unsteady gait, hx of recent falls and fx Recently f/up with neurology - HH PT ordered again Pt did not tolerate sinemet - on requip, dosing gradually increasing, pt has questions about HH orders and med changes - reviewed last 2 neuro OV notes with pt today  Impaired mobility and unsteady gait - pt is using walker all the time now that humerus fx healed, she is doing better with more consistent walker use, no falls in the past couple months  Patient Active Problem List   Diagnosis Date Noted  . Impaired mobility and ADLs 04/15/2020  . Leg weakness, bilateral 04/15/2020  . Malnutrition ([place]) 04/15/2020  . Vitamin D deficiency 07/20/2019  . 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[place]) 07/20/2019  . At high risk for falls 07/20/2019  . Mass on back 01/21/2019  . Fatigue 01/15/2019  . Parkinson disease ([place]) 01/15/2019  . Hyperlipidemia 01/15/2019  . Diabetes mellitus type 2 in obese ([place]) 01/13/2019  . Goiter 01/13/2019  . Overactive bladder 01/13/2019  . Obesity (BMI 30.0-34.9) 01/13/2019  . Medication monitoring encounter 01/13/2019  . Gait abnormality 01/13/2019    Social History   Tobacco Use  .  Smoking status: Former Smoker    Packs/day: 2.00    Years: 40.00    Pack years: 80.00    Types: Cigarettes    Quit date: 12/10/2018    Years since quitting: 1.5  . Smokeless tobacco: Never Used  Substance Use Topics  . Alcohol use: Never     Current Outpatient Medications:  .  aspirin EC 81 MG tablet, Take 1 tablet (81 mg total) by mouth daily. (take at least one hour prior to any ibuprofen), Disp: , Rfl:  .  atorvastatin (LIPITOR) 40 MG tablet, TAKE 1 TABLET BY MOUTH AT BEDTIME, Disp: 90 tablet, Rfl: 0 .  buPROPion (WELLBUTRIN XL) 150 MG 24 hr tablet, TAKE 1 TABLET(150 MG) BY MOUTH DAILY, Disp: 90 tablet, Rfl: 1 .  cholecalciferol (VITAMIN D3) 25 MCG (1000 UNIT) tablet, Take  1,000 Units by mouth daily., Disp: , Rfl:  .  metFORMIN (GLUCOPHAGE-XR) 500 MG 24 hr tablet, TAKE 1 TABLET(500 MG) BY MOUTH Once daily, Disp: 90 tablet, Rfl: 3 .  mirabegron ER (MYRBETRIQ) 25 MG TB24 tablet, Take 1 tablet (25 mg total) by mouth daily., Disp: 30 tablet, Rfl: 11 .  rOPINIRole (REQUIP) 0.25 MG tablet, , Disp: , Rfl:  .  solifenacin (VESICARE) 5 MG tablet, Take 1 tablet (5 mg total) by mouth daily., Disp: 30 tablet, Rfl: 11 .  vitamin B-12 (CYANOCOBALAMIN) 100 MCG tablet, Take 100 mcg by mouth daily., Disp: , Rfl:  .  ACCU-CHEK GUIDE test strip, , Disp: , Rfl:  .  Accu-Chek Softclix Lancets lancets, , Disp: , Rfl:  .  Alcohol Swabs (B-D SINGLE USE SWABS REGULAR) PADS, , Disp: , Rfl:  .  Blood Glucose Monitoring Suppl (ACCU-CHEK GUIDE ME) w/Device KIT, , Disp: , Rfl:   No Known Allergies  Chart Review: I personally reviewed active problem list, medication list, allergies, family history, social history, health maintenance, notes from last encounter, lab results, imaging with the patient/caregiver today.   Review of Systems 10 Systems reviewed and are negative for acute change except as noted in the HPI.   Objective:    Virtual encounter, vitals limited, only able to obtain the following Today's Vitals   07/08/20 0929  Weight: 147 lb (66.7 kg)  Height: _0  (1.575 m)   Body mass index is 26.89 kg/m. Nursing Note and Vital Signs reviewed.  Physical Exam Vitals and nursing note reviewed.  Neck:     Comments: Phonation clear Pulmonary:     Comments: No audible tachypnea, stridor or wheeze Neurological:     Mental Status: She is alert.  Psychiatric:        Mood and Affect: Mood normal.        Behavior: Behavior normal.     PE limited by telephone encounter  No results found for this or any previous visit (from the past 72 hour(s)).  Assessment and Plan:     ICD-10-CM   1. Goiter  E04.9   2. Parkinson disease ([place])  G20    per neurology - reviewed last  couple OV, pt to f/up on [facility] - will let us know if we need to order or sign anything per medicare  3. Impaired mobility and ADLs  Z74.09    Z78.9    using walker and more steady with walker, pending further PT/HH  4. Gait abnormality  R26.9    see above  5. Current mild episode of major depressive disorder, unspecified whether recurrent ([place])  F32.0    sx well controlled  on current meds, continue wellbutrin 150 mg daily, PHQ reviewed  6. Abnormal CT of the chest  R93.89    reviewed recent LDCT for lung CA screening - discussed gallstone - no sx, liver - monitoring, goiter - ENT   Asked pt to come in office for DM and HLD f/up and labs, we briefly discussed d/c of statin due to benefit vs risk changes - with parkinsons and other progressive chronic disease, benefit of statin may be low compared to possible SE -   Pt will contact ENT for f/up  Pt will call Neuro at [facility] for med questions and HH/PT f/up and let us know if anything additional from PCP is needed to help    - I discussed the assessment and treatment plan with the patient. The patient was provided an opportunity to ask questions and all were answered. The patient agreed with the plan and demonstrated an understanding of the instructions.    I provided 30+ minutes of non-face-to-face time during this encounter.  Delsa Grana, PA-C 07/08/20 11:01 AM

## 2020-07-22 ENCOUNTER — Other Ambulatory Visit: Payer: Self-pay | Admitting: Family Medicine

## 2020-07-22 MED ORDER — METFORMIN HCL ER 500 MG PO TB24: ORAL_TABLET | ORAL | 0 refills | Status: DC

## 2020-07-22 NOTE — Telephone Encounter (Signed)
Requested Prescriptions  Pending Prescriptions Disp Refills  . metFORMIN (GLUCOPHAGE-XR) 500 MG 24 hr tablet 180 tablet 0    Sig: TAKE 1 TABLET(500 MG) BY MOUTH BID     Endocrinology:  Diabetes - Biguanides Failed - 07/22/2020 10:20 AM      Failed - HBA1C is between 0 and 7.9 and within 180 days    Hgb A1c MFr Bld  Date Value Ref Range Status  01/05/2020 6.2 (H) <5.7 % of total Hgb Final    Comment:    For someone without known diabetes, a hemoglobin  A1c value between 5.7% and 6.4% is consistent with prediabetes and should be confirmed with a  follow-up test. . For someone with known diabetes, a value <7% indicates that their diabetes is well controlled. A1c targets should be individualized based on duration of diabetes, age, comorbid conditions, and other considerations. . This assay result is consistent with an increased risk of diabetes. . Currently, no consensus exists regarding use of hemoglobin A1c for diagnosis of diabetes for children. .          Passed - Cr in normal range and within 360 days    Creat  Date Value Ref Range Status  01/05/2020 0.67 0.60 - 0.93 mg/dL Final    Comment:    For patients >62 years of age, the reference limit for Creatinine is approximately 13% higher for people identified as African-American. .    Creatinine, Urine  Date Value Ref Range Status  01/13/2019 99 20 - 275 mg/dL Final         Passed - eGFR in normal range and within 360 days    GFR, Est African American  Date Value Ref Range Status  01/05/2020 98 > OR = 60 mL/min/1.48m Final   GFR, Est Non African American  Date Value Ref Range Status  01/05/2020 84 > OR = 60 mL/min/1.773mFinal         Passed - Valid encounter within last 6 months    Recent Outpatient Visits          [facility]   3 months ago Acute conjunctivitis of left eye, unspecified acute conjunctivitis type   [facility]   6 months [facility]   7 months [facility] with hematuria   [facility]   8 months ago Mixed hyperlipidemia   [facility]      Future Appointments            In 5 months EsFestus AloeMD BuScott In 10 months  [facility] dose changed on 08/31/19 to 500 mg BID; ordered per protocol.

## 2020-07-22 NOTE — Telephone Encounter (Signed)
Medication Refill - Medication:   metFORMIN (GLUCOPHAGE-XR) 500 MG 24 hr tablet [[id number]]  90 day supply  Pt takes 2 per day , this was increase by the dr   Has the patient contacted their pharmacy? No. (Agent: If no, request that the patient contact the pharmacy for the refill.) (Agent: If yes, when and what did the pharmacy advise?)  Preferred Pharmacy (with phone number or street name): [facility][id number] [place], [place] - [address] AT [facility] [address] [facility]  [address] [place], [place] [address]  Phone:  [phone] Fax:  [phone]    Agent: Please be advised that RX refills may take up to 3 business days. We ask that you follow-up with your pharmacy.

## 2020-07-22 NOTE — Telephone Encounter (Signed)
Note that pt. Has stated her dose was increased and she is asking for a 90 day supply.

## 2020-07-26 ENCOUNTER — Other Ambulatory Visit: Payer: Self-pay

## 2020-07-26 DIAGNOSIS — E782 Mixed hyperlipidemia: Secondary | ICD-10-CM

## 2020-07-26 MED ORDER — ATORVASTATIN CALCIUM 40 MG PO TABS: 40.0000 mg | ORAL_TABLET | Freq: Every day | ORAL | 3 refills | Status: DC

## 2020-08-01 ENCOUNTER — Encounter
Admission: RE | Admit: 2020-08-01 | Discharge: 2020-08-01 | Disposition: A | Discharge status: Home / Self Care | Source: Ambulatory Visit | Attending: Otolaryngology | Admitting: Otolaryngology

## 2020-08-01 ENCOUNTER — Other Ambulatory Visit: Payer: Self-pay

## 2020-08-01 ENCOUNTER — Encounter
Admission: RE | Admit: 2020-08-01 | Discharge: 2020-08-01 | Disposition: A | Discharge status: Home / Self Care | Payer: Medicare PPO | Source: Ambulatory Visit | Attending: Otolaryngology | Admitting: Otolaryngology

## 2020-08-01 ENCOUNTER — Other Ambulatory Visit

## 2020-08-01 DIAGNOSIS — Z20822 Contact with and (suspected) exposure to covid-19: Secondary | ICD-10-CM | POA: Insufficient documentation

## 2020-08-01 DIAGNOSIS — Z01818 Encounter for other preprocedural examination: Secondary | ICD-10-CM | POA: Insufficient documentation

## 2020-08-01 HISTORY — DX: Pneumonia, unspecified organism: J18.9

## 2020-08-01 LAB — BASIC METABOLIC PANEL WITH GFR
Anion gap: 12 (ref 5–15)
BUN: 14 mg/dL (ref 8–23)
CO2: 26 mmol/L (ref 22–32)
Calcium: 8.7 mg/dL — ABNORMAL LOW (ref 8.9–10.3)
Chloride: 102 mmol/L (ref 98–111)
Creatinine, Ser: 0.58 mg/dL (ref 0.44–1.00)
GFR calc Af Amer: >60 mL/min
GFR calc non Af Amer: >60 mL/min
Glucose, Bld: 221 mg/dL — ABNORMAL HIGH (ref 70–99)
Potassium: 3.9 mmol/L (ref 3.5–5.1)
Sodium: 140 mmol/L (ref 135–145)

## 2020-08-01 NOTE — Patient Instructions (Addendum)
Your procedure is scheduled on: Wednesday, August 25 Report to Day Surgery on the 2nd floor of the [facility] To find out your arrival time, please call [phone] between 1PM - 3PM on: Tuesday, August 24  REMEMBER: Instructions that are not followed completely may result in serious medical risk, up to and including death; or upon the discretion of your surgeon and anesthesiologist your surgery may need to be rescheduled.  Do not eat food after midnight the night before surgery.  No gum chewing, lozengers or hard candies.  You may however, drink water up to 2 hours before you are scheduled to arrive for your surgery. Do not drink anything within 2 hours of your scheduled arrival time.  TAKE THE FOLLOWING MEDICATIONS THE MORNING OF SURGERY WITH A SIP OF WATER:  1.  BUPROPION [facility]) 2.  MIRABEGRON (MYRBETRIQ) 3.  ROPINIROLE (REQUIP)  Stop Metformin 2 days prior to surgery. LAST DAY TO TAKE IS Sunday, August 22; resume after surgery.  Stop aspirin and Anti-inflammatories (NSAIDS) such as Advil, Aleve, Ibuprofen, Motrin, Naproxen, Naprosyn and Aspirin based products such as Excedrin, Goodys Powder, BC Powder. (May take Tylenol or Acetaminophen if needed.)  Stop ANY OVER THE COUNTER supplements until after surgery. (May continue Vitamin D, Vitamin B.)  No Alcohol for 24 hours before or after surgery.  No Smoking including e-cigarettes for 24 hours prior to surgery.   On the morning of surgery brush your teeth with toothpaste and water, you may rinse your mouth with mouthwash if you wish. Do not swallow any toothpaste or mouthwash.  Do not wear jewelry, make-up, hairpins, clips or nail polish.  Do not wear lotions, powders, or perfumes.   Do not shave 48 hours prior to surgery.   Do not bring valuables to the hospital. [facility] is not responsible for any missing/lost belongings or valuables.   Use CHG Soap as directed on instruction sheet.  Notify your doctor if  there is any change in your medical condition (cold, fever, infection).  Wear comfortable clothing (specific to your surgery type) to the hospital.  Plan for stool softeners for home use; pain medications have a tendency to cause constipation. You can also help prevent constipation by eating foods high in fiber such as fruits and vegetables and drinking plenty of fluids as your diet allows.  After surgery, you can help prevent lung complications by doing breathing exercises.  Take deep breaths and cough every 1-2 hours. Your doctor may order a device called an Incentive Spirometer to help you take deep breaths.  If you are being admitted to the hospital overnight, leave your suitcase in the car. After surgery it may be brought to your room.  If you are being discharged the day of surgery, you will not be allowed to drive home. You will need a responsible adult (18 years or older) to drive you home and stay with you that night.   If you are taking public transportation, you will need to have a responsible adult (18 years or older) with you. Please confirm with your physician that it is acceptable to use public transportation.   Please call the Pre-admissions Testing Dept. at [phone] if you have any questions about these instructions.  Visitation Policy:  Patients undergoing a surgery or procedure may have one family member or support person with them as long as that person is not COVID-19 positive or experiencing its symptoms.  That person may remain in the waiting area during the procedure.  Inpatient Visitation Update:   In an effort to ensure the safety of our team members and our patients, we are implementing a change to our visitation policy:  Effective Monday, Aug. 9, at 7 a.m., inpatients will be allowed one support person.  o The support person may change daily.  o The support person must pass our screening, gel in and out, and wear a mask at all times, including in the  patient's room.  o Patients must also wear a mask when staff or their support person are in the room.  o Masking is required regardless of vaccination status.  Systemwide, no visitors 17 or younger.

## 2020-08-02 ENCOUNTER — Other Ambulatory Visit

## 2020-08-02 LAB — SARS CORONAVIRUS 2 (TAT 6-24 HRS): SARS Coronavirus 2: NEGATIVE

## 2020-08-03 ENCOUNTER — Observation Stay
Admission: RE | Admit: 2020-08-03 | Discharge: 2020-08-04 | Disposition: A | Discharge status: Home / Self Care | Payer: Medicare PPO | Attending: Otolaryngology | Admitting: Otolaryngology

## 2020-08-03 ENCOUNTER — Encounter
Admission: RE | Disposition: A | Discharge status: Home / Self Care | Payer: Self-pay | Source: Home / Self Care | Attending: Otolaryngology

## 2020-08-03 ENCOUNTER — Ambulatory Visit: Payer: Medicare PPO | Admitting: Certified Registered"

## 2020-08-03 ENCOUNTER — Other Ambulatory Visit: Payer: Self-pay

## 2020-08-03 ENCOUNTER — Encounter: Payer: Self-pay | Admitting: Otolaryngology

## 2020-08-03 DIAGNOSIS — Z87891 Personal history of nicotine dependence: Secondary | ICD-10-CM | POA: Insufficient documentation

## 2020-08-03 DIAGNOSIS — E042 Nontoxic multinodular goiter: Principal | ICD-10-CM | POA: Insufficient documentation

## 2020-08-03 DIAGNOSIS — Z794 Long term (current) use of insulin: Secondary | ICD-10-CM | POA: Diagnosis not present

## 2020-08-03 DIAGNOSIS — E119 Type 2 diabetes mellitus without complications: Secondary | ICD-10-CM | POA: Diagnosis not present

## 2020-08-03 DIAGNOSIS — E049 Nontoxic goiter, unspecified: Secondary | ICD-10-CM | POA: Diagnosis present

## 2020-08-03 HISTORY — PX: THYROIDECTOMY: SHX17

## 2020-08-03 LAB — GLUCOSE, CAPILLARY
Glucose-Capillary: 131 mg/dL — ABNORMAL HIGH (ref 70–99)
Glucose-Capillary: 162 mg/dL — ABNORMAL HIGH (ref 70–99)
Glucose-Capillary: 276 mg/dL — ABNORMAL HIGH (ref 70–99)
Glucose-Capillary: 293 mg/dL — ABNORMAL HIGH (ref 70–99)

## 2020-08-03 SURGERY — THYROIDECTOMY
Anesthesia: General | Laterality: Left

## 2020-08-03 MED ORDER — ONDANSETRON HCL 4 MG/2ML IJ SOLN
INTRAMUSCULAR | Status: DC | PRN
  Administered 2020-08-03 (×2): 4 mg via INTRAVENOUS

## 2020-08-03 MED ORDER — ORAL CARE MOUTH RINSE: 15.0000 mL | Freq: Once | OROMUCOSAL | Status: AC

## 2020-08-03 MED ORDER — SUCCINYLCHOLINE CHLORIDE 20 MG/ML IJ SOLN
INTRAMUSCULAR | Status: DC | PRN
  Administered 2020-08-03: 80 mg via INTRAVENOUS

## 2020-08-03 MED ORDER — DOCUSATE SODIUM 100 MG PO CAPS
100.0000 mg | ORAL_CAPSULE | Freq: Two times a day (BID) | ORAL | Status: DC
  Administered 2020-08-03 – 2020-08-04 (×2): 100 mg via ORAL
  Filled 2020-08-03 (×3): qty 1

## 2020-08-03 MED ORDER — DEXAMETHASONE SODIUM PHOSPHATE 10 MG/ML IJ SOLN
INTRAMUSCULAR | Status: DC | PRN
  Administered 2020-08-03: 10 mg via INTRAVENOUS

## 2020-08-03 MED ORDER — ACETAMINOPHEN 10 MG/ML IV SOLN
INTRAVENOUS | Status: DC | PRN
  Administered 2020-08-03: 1000 mg via INTRAVENOUS

## 2020-08-03 MED ORDER — MIDAZOLAM HCL 2 MG/2ML IJ SOLN
INTRAMUSCULAR | Status: AC
  Filled 2020-08-03: qty 2

## 2020-08-03 MED ORDER — INSULIN ASPART 100 UNIT/ML ~~LOC~~ SOLN
0.0000 [IU] | Freq: Three times a day (TID) | SUBCUTANEOUS | Status: DC
  Administered 2020-08-03: 8 [IU] via SUBCUTANEOUS
  Administered 2020-08-04: 2 [IU] via SUBCUTANEOUS
  Administered 2020-08-04: 3 [IU] via SUBCUTANEOUS
  Filled 2020-08-03 (×3): qty 1

## 2020-08-03 MED ORDER — CHLORHEXIDINE GLUCONATE 0.12 % MT SOLN: 15.0000 mL | Freq: Once | OROMUCOSAL | Status: AC

## 2020-08-03 MED ORDER — REMIFENTANIL HCL 1 MG IV SOLR
INTRAVENOUS | Status: AC
  Filled 2020-08-03: qty 1000

## 2020-08-03 MED ORDER — ACETAMINOPHEN 10 MG/ML IV SOLN
INTRAVENOUS | Status: AC
  Filled 2020-08-03: qty 100

## 2020-08-03 MED ORDER — ATORVASTATIN CALCIUM 20 MG PO TABS
40.0000 mg | ORAL_TABLET | Freq: Every day | ORAL | Status: DC
  Administered 2020-08-03: 40 mg via ORAL
  Filled 2020-08-03: qty 2

## 2020-08-03 MED ORDER — MORPHINE SULFATE (PF) 2 MG/ML IV SOLN: 2.0000 mg | INTRAVENOUS | Status: DC | PRN

## 2020-08-03 MED ORDER — METFORMIN HCL ER 500 MG PO TB24
500.0000 mg | ORAL_TABLET | Freq: Two times a day (BID) | ORAL | Status: DC
  Administered 2020-08-03 – 2020-08-04 (×2): 500 mg via ORAL
  Filled 2020-08-03 (×3): qty 1

## 2020-08-03 MED ORDER — LIDOCAINE-EPINEPHRINE (PF) 1 %-1:200000 IJ SOLN
INTRAMUSCULAR | Status: DC | PRN
  Administered 2020-08-03: 4 mL

## 2020-08-03 MED ORDER — ALBUMIN HUMAN 5 % IV SOLN: INTRAVENOUS | Status: DC | PRN

## 2020-08-03 MED ORDER — PROPOFOL 10 MG/ML IV BOLUS
INTRAVENOUS | Status: AC
  Filled 2020-08-03: qty 20

## 2020-08-03 MED ORDER — FAMOTIDINE 20 MG PO TABS
ORAL_TABLET | ORAL | Status: AC
  Filled 2020-08-03: qty 1

## 2020-08-03 MED ORDER — SODIUM CHLORIDE 0.9 % IV SOLN
INTRAVENOUS | Status: DC | PRN
  Administered 2020-08-03: 20 ug/min via INTRAVENOUS

## 2020-08-03 MED ORDER — ROPINIROLE HCL 0.25 MG PO TABS
0.5000 mg | ORAL_TABLET | Freq: Three times a day (TID) | ORAL | Status: DC
  Administered 2020-08-03 – 2020-08-04 (×3): 0.5 mg via ORAL
  Filled 2020-08-03 (×5): qty 2

## 2020-08-03 MED ORDER — HYDROCODONE-ACETAMINOPHEN 5-325 MG PO TABS
ORAL_TABLET | ORAL | Status: AC
Start: 2020-08-03 — End: 2020-08-04
  Filled 2020-08-03: qty 1

## 2020-08-03 MED ORDER — FENTANYL CITRATE (PF) 100 MCG/2ML IJ SOLN
INTRAMUSCULAR | Status: DC | PRN
Start: 2020-08-03 — End: 2020-08-03
  Administered 2020-08-03 (×3): 50 ug via INTRAVENOUS

## 2020-08-03 MED ORDER — CHLORHEXIDINE GLUCONATE 0.12 % MT SOLN
OROMUCOSAL | Status: AC
  Administered 2020-08-03: 15 mL via OROMUCOSAL
  Filled 2020-08-03: qty 15

## 2020-08-03 MED ORDER — FAMOTIDINE 20 MG PO TABS
20.0000 mg | ORAL_TABLET | Freq: Once | ORAL | Status: AC
  Administered 2020-08-03: 20 mg via ORAL

## 2020-08-03 MED ORDER — INSULIN ASPART 100 UNIT/ML ~~LOC~~ SOLN
0.0000 [IU] | Freq: Every day | SUBCUTANEOUS | Status: DC
  Administered 2020-08-03: 3 [IU] via SUBCUTANEOUS
  Filled 2020-08-03: qty 1

## 2020-08-03 MED ORDER — FENTANYL CITRATE (PF) 100 MCG/2ML IJ SOLN
INTRAMUSCULAR | Status: AC
  Filled 2020-08-03: qty 2

## 2020-08-03 MED ORDER — HYDROCODONE-ACETAMINOPHEN 5-325 MG PO TABS
ORAL_TABLET | ORAL | Status: AC
  Filled 2020-08-03: qty 1

## 2020-08-03 MED ORDER — BACITRACIN 500 UNIT/GM EX OINT
TOPICAL_OINTMENT | CUTANEOUS | Status: DC | PRN
  Administered 2020-08-03: 1 via TOPICAL

## 2020-08-03 MED ORDER — ONDANSETRON HCL 4 MG/2ML IJ SOLN: 4.0000 mg | INTRAMUSCULAR | Status: DC | PRN

## 2020-08-03 MED ORDER — ALBUMIN HUMAN 5 % IV SOLN
INTRAVENOUS | Status: AC
  Filled 2020-08-03: qty 250

## 2020-08-03 MED ORDER — ONDANSETRON HCL 4 MG PO TABS: 4.0000 mg | ORAL_TABLET | ORAL | Status: DC | PRN

## 2020-08-03 MED ORDER — PROPOFOL 10 MG/ML IV BOLUS
INTRAVENOUS | Status: AC
  Filled 2020-08-03: qty 40

## 2020-08-03 MED ORDER — BACITRACIN ZINC 500 UNIT/GM EX OINT
TOPICAL_OINTMENT | CUTANEOUS | Status: AC
  Filled 2020-08-03: qty 28.35

## 2020-08-03 MED ORDER — VITAMIN D 25 MCG (1000 UNIT) PO TABS
1000.0000 [IU] | ORAL_TABLET | Freq: Every day | ORAL | Status: DC
  Administered 2020-08-03 – 2020-08-04 (×2): 1000 [IU] via ORAL
  Filled 2020-08-03 (×2): qty 1

## 2020-08-03 MED ORDER — EPHEDRINE SULFATE 50 MG/ML IJ SOLN
INTRAMUSCULAR | Status: DC | PRN
  Administered 2020-08-03: 10 mg via INTRAVENOUS
  Administered 2020-08-03 (×2): 5 mg via INTRAVENOUS
  Administered 2020-08-03: 10 mg via INTRAVENOUS
  Administered 2020-08-03: 5 mg via INTRAVENOUS

## 2020-08-03 MED ORDER — ESMOLOL HCL 100 MG/10ML IV SOLN
INTRAVENOUS | Status: DC | PRN
  Administered 2020-08-03: 10 mg via INTRAVENOUS

## 2020-08-03 MED ORDER — KCL IN DEXTROSE-NACL 40-5-0.45 MEQ/L-%-% IV SOLN
INTRAVENOUS | Status: DC
  Filled 2020-08-03 (×4): qty 1000

## 2020-08-03 MED ORDER — PROPOFOL 10 MG/ML IV BOLUS
INTRAVENOUS | Status: DC | PRN
  Administered 2020-08-03: 100 mg via INTRAVENOUS

## 2020-08-03 MED ORDER — LIDOCAINE HCL (CARDIAC) PF 100 MG/5ML IV SOSY
PREFILLED_SYRINGE | INTRAVENOUS | Status: DC | PRN
  Administered 2020-08-03: 80 mg via INTRAVENOUS

## 2020-08-03 MED ORDER — MIRABEGRON ER 25 MG PO TB24
25.0000 mg | ORAL_TABLET | Freq: Every day | ORAL | Status: DC
  Administered 2020-08-03 – 2020-08-04 (×2): 25 mg via ORAL
  Filled 2020-08-03 (×2): qty 1

## 2020-08-03 MED ORDER — BUPROPION HCL ER (XL) 150 MG PO TB24
150.0000 mg | ORAL_TABLET | Freq: Every day | ORAL | Status: DC
  Administered 2020-08-03 – 2020-08-04 (×2): 150 mg via ORAL
  Filled 2020-08-03 (×2): qty 1

## 2020-08-03 MED ORDER — FENTANYL CITRATE (PF) 100 MCG/2ML IJ SOLN: 25.0000 ug | INTRAMUSCULAR | Status: DC | PRN

## 2020-08-03 MED ORDER — PHENYLEPHRINE HCL (PRESSORS) 10 MG/ML IV SOLN
INTRAVENOUS | Status: DC | PRN
  Administered 2020-08-03: 200 ug via INTRAVENOUS
  Administered 2020-08-03 (×3): 100 ug via INTRAVENOUS

## 2020-08-03 MED ORDER — SODIUM CHLORIDE 0.9 % IV SOLN: INTRAVENOUS | Status: DC

## 2020-08-03 MED ORDER — GLYCOPYRROLATE 0.2 MG/ML IJ SOLN
INTRAMUSCULAR | Status: DC | PRN
  Administered 2020-08-03: .2 mg via INTRAVENOUS

## 2020-08-03 MED ORDER — HYDROCODONE-ACETAMINOPHEN 5-325 MG PO TABS
1.0000 | ORAL_TABLET | ORAL | Status: DC | PRN
  Administered 2020-08-03: 1 via ORAL
  Administered 2020-08-03: 2 via ORAL
  Administered 2020-08-03: 1 via ORAL
  Filled 2020-08-03 (×2): qty 1

## 2020-08-03 MED ORDER — REMIFENTANIL HCL 1 MG IV SOLR
INTRAVENOUS | Status: DC | PRN
Start: 2020-08-03 — End: 2020-08-03
  Administered 2020-08-03: .1 ug/kg/min via INTRAVENOUS

## 2020-08-03 MED ORDER — LIDOCAINE-EPINEPHRINE (PF) 1 %-1:200000 IJ SOLN
INTRAMUSCULAR | Status: AC
  Filled 2020-08-03: qty 30

## 2020-08-03 MED ORDER — ONDANSETRON HCL 4 MG/2ML IJ SOLN: 4.0000 mg | Freq: Once | INTRAMUSCULAR | Status: DC | PRN

## 2020-08-03 MED ORDER — BACITRACIN ZINC 500 UNIT/GM EX OINT
1.0000 "application " | TOPICAL_OINTMENT | Freq: Three times a day (TID) | CUTANEOUS | Status: DC
  Administered 2020-08-03 – 2020-08-04 (×3): 1 via TOPICAL
  Filled 2020-08-03: qty 0.9

## 2020-08-03 SURGICAL SUPPLY — 36 items
BLADE SURG 15 STRL LF DISP TIS (BLADE) ×1 IMPLANT
BLADE SURG 15 STRL SS (BLADE) ×1
BULB RESERV EVAC DRAIN JP 100C (MISCELLANEOUS) ×2 IMPLANT
CANISTER SUCT 1200ML W/VALVE (MISCELLANEOUS) ×2 IMPLANT
CORD BIP STRL DISP 12FT (MISCELLANEOUS) ×2 IMPLANT
COVER WAND RF STERILE (DRAPES) ×2 IMPLANT
DRAIN TLS ROUND 10FR (DRAIN) ×2 IMPLANT
DRAPE MAG INST 16X20 L/F (DRAPES) ×2 IMPLANT
DRSG TEGADERM 2-3/8X2-3/4 SM (GAUZE/BANDAGES/DRESSINGS) ×2 IMPLANT
ELECT LARYNGEAL 6/7 (MISCELLANEOUS) ×2
ELECT LARYNGEAL 8/9 (MISCELLANEOUS) ×2
ELECT REM PT RETURN 9FT ADLT (ELECTROSURGICAL) ×2
ELECTRODE LARYNGEAL 6/7 (MISCELLANEOUS) ×1 IMPLANT
ELECTRODE LARYNGEAL 8/9 (MISCELLANEOUS) ×1 IMPLANT
ELECTRODE REM PT RTRN 9FT ADLT (ELECTROSURGICAL) ×1 IMPLANT
FORCEPS JEWEL BIP 4-3/4 STR (INSTRUMENTS) ×2 IMPLANT
GAUZE 4X4 16PLY RFD (DISPOSABLE) ×8 IMPLANT
GLOVE BIO SURGEON STRL SZ7.5 (GLOVE) ×4 IMPLANT
GOWN STRL REUS W/ TWL LRG LVL3 (GOWN DISPOSABLE) ×3 IMPLANT
GOWN STRL REUS W/TWL LRG LVL3 (GOWN DISPOSABLE) ×3
HEMOSTAT SURGICEL 2X3 (HEMOSTASIS) ×2 IMPLANT
HOOK STAY BLUNT/RETRACTOR 5M (MISCELLANEOUS) ×2 IMPLANT
KIT TURNOVER KIT A (KITS) ×2 IMPLANT
LABEL OR SOLS (LABEL) ×2 IMPLANT
NS IRRIG 500ML POUR BTL (IV SOLUTION) ×2 IMPLANT
PACK HEAD/NECK (MISCELLANEOUS) ×2 IMPLANT
PROBE NEUROSIGN BIPOL (MISCELLANEOUS) ×1 IMPLANT
PROBE NEUROSIGN BIPOLAR (MISCELLANEOUS) ×1
SHEARS HARMONIC 9CM CVD (BLADE) ×2 IMPLANT
SPONGE KITTNER 5P (MISCELLANEOUS) ×2 IMPLANT
SUT PROLENE 3 0 PS 2 (SUTURE) ×2 IMPLANT
SUT SILK 2 0 (SUTURE) ×1
SUT SILK 2 0 SH (SUTURE) ×2 IMPLANT
SUT SILK 2-0 18XBRD TIE 12 (SUTURE) ×1 IMPLANT
SUT VIC AB 4-0 RB1 18 (SUTURE) ×2 IMPLANT
SYSTEM CHEST DRAIN TLS 7FR (DRAIN) IMPLANT

## 2020-08-03 NOTE — H&P (Signed)
History and physical reviewed and will be scanned in later. No change in medical status reported by the patient or family, appears stable for surgery. All questions regarding the procedure answered, and patient (or family if a child) expressed understanding of the procedure. ? ?Whitney [name] ?@TODAY@ ?

## 2020-08-03 NOTE — Op Note (Signed)
  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  The patient was taken to the operating room and with the patient in a comfortable supine position,  general endotracheal anesthesia was induced without difficulty.  A nerve monitor on the endotracheal tube was visualized to be between the cords at the time of intubation. A proper time-out was performed, confirming the operative site and procedure.  The patient was placed on a shoulder roll and position. Skin was injected with 1% lidocaine with epinephrine 1-200,000. The patient was then prepped and draped in the usual sterile fashion. A 15 blade was used to incise the skin carrying the incision down through the subcutaneous tissues. The platysma muscle was divided with the Bovie. The strap muscles were identified in the midline and divided in the midline, retracting them laterally. Small anterior jugular veins were either clamped and suture divided or divided with the Harmonic scalpel. The strap muscles were retracted laterally off of the capsule of the left lobe of the thyroid gland. This was then finger dissected to loosen loose attachments to the gland. Dissection proceeded superiorly, dissecting the superior pole of the gland. The superior pole vessels were divided with the Harmonic scalpel. A small parathyroid gland was visualized and preserved during this dissection. The superior pole was retracted inferiorly and dissection proceeded around the lateral aspect of the gland, dividing vascular attachments with the Harmonic scalpel. The inferior pole  was dissected, and the inferior pole vessels were divided with the Harmonic scalpel. Care was taken to divide all of these vessels right at the capsule of the gland. The gland could not be easily delivered so the strap muscles were partially divided to facilitate removal. The gland was then retracted medially and delivered from the wound. Dissection along the trachea revealed the recurrent laryngeal nerve which stimulated properly. The gland was then dissected off of the trachea, dividing Berry's ligament with the nerve carefully protected. The left lobe of the gland was then divided at the isthmus and removed.   The wound was then irrigated and inspected for bleeding. #10 TLS drains were placed on the left side of the trachea with the drain coming out through the skin just below the wound. The drain was  secured with 4-0 Vicryl suture. Surgicel was placed on the side of the trachea to control minor oozing within the wound. The strap muscles were then reapproximated with 4-0 Vicryl suture. The platysma and subcutaneous tissues were also closed with 4-0 Vicryl suture. The skin was closed with a 3-0 running subcuticular Prolene suture. The patient was then returned to the anesthesiologist for awakening. The patient was awakened and taken to recovery room in good condition postoperatively.  The patient was then returned to the anesthesiologist in good condition for awakening. The patient was awakened and taken to the recovery room in good condition.   Disposition:   PACU then transferred to floor  Plan: The patient is to be admitted for observation , with potential discharge tomorrow.   Sandi Mealy 08/03/2020 10:10 AM

## 2020-08-03 NOTE — Progress Notes (Signed)
The patient is stable. Cpox monitoring. No falls. Patient uses cane/walker at home. Pain managed with PRN pain meds. Monitoring drain output.

## 2020-08-03 NOTE — Transfer of Care (Signed)
Immediate Anesthesia Transfer of Care Note  Patient: Whitney [name]  Procedure(s) Performed: Gwynneth Munson (Left )  Patient Location: PACU  Anesthesia Type:General  Level of Consciousness: awake, drowsy and patient cooperative  Airway & Oxygen Therapy: Patient Spontanous Breathing and Patient connected to face mask oxygen  Post-op Assessment: Report given to RN, Post -op Vital signs reviewed and stable and Patient moving all extremities X 4  Post vital signs: Reviewed and stable  Last Vitals:  Vitals Value Taken Time  BP 131/65 08/03/20 1014  Temp 36.4 C 08/03/20 1014  Pulse 102 08/03/20 1014  Resp 17 08/03/20 1014  SpO2 99 % 08/03/20 1014    Last Pain:  Vitals:   08/03/20 1014  TempSrc:   PainSc: 0-No pain         Complications: No complications documented.

## 2020-08-03 NOTE — Anesthesia Preprocedure Evaluation (Signed)
Anesthesia Evaluation  Patient identified by MRN, date of birth, ID band Patient awake    Reviewed: Allergy & Precautions, NPO status , Patient's Chart, lab work & pertinent test results  Airway Mallampati: III       Dental   Pulmonary former smoker,           Cardiovascular negative cardio ROS       Neuro/Psych PSYCHIATRIC DISORDERS Depression negative neurological ROS     GI/Hepatic negative GI ROS, Neg liver ROS,   Endo/Other  diabetes  Renal/GU negative Renal ROS  negative genitourinary   Musculoskeletal  (+) Arthritis ,   Abdominal   Peds negative pediatric ROS (+)  Hematology negative hematology ROS (+)   Anesthesia Other Findings Past Medical History: No date: Arthritis No date: Broken shoulder     Comment:  left humerus  No date: COVID-19 No date: Depression No date: Diabetes mellitus without complication (HCC) No date: Goiter No date: Hyperlipidemia No date: Overactive bladder No date: Parkinson's disease (HCC) No date: Pneumonia No date: Unsteady gait  Reproductive/Obstetrics                             Anesthesia Physical Anesthesia Plan  ASA: III  Anesthesia Plan: General   Post-op Pain Management:    Induction: Intravenous  PONV Risk Score and Plan:   Airway Management Planned: Video Laryngoscope Planned and Oral ETT  Additional Equipment:   Intra-op Plan:   Post-operative Plan: Extubation in OR  Informed Consent: I have reviewed the patients History and Physical, chart, labs and discussed the procedure including the risks, benefits and alternatives for the proposed anesthesia with the patient or authorized representative who has indicated his/her understanding and acceptance.     Dental advisory given  Plan Discussed with: CRNA and Surgeon  Anesthesia Plan Comments:         Anesthesia Quick Evaluation

## 2020-08-03 NOTE — Progress Notes (Signed)
Kalese, Ensz [id number] [birth year] [name], MD   SUBJECTIVE: This 79 y.o. year old female is status post left hemithyroidectomy today, and appears to be doing well. Some sore throat but voice is strong and able to eat this evening. Swallowing better now that mass is out.   Medications:  Current Facility-Administered Medications  Medication Dose Route Frequency Provider Last Rate Last Admin  . atorvastatin (LIPITOR) tablet 40 mg  40 mg Oral QHS Clyde Canterbury, MD      . bacitracin ointment 1 application  1 application Topical J1O Clyde Canterbury, MD   1 application at 84/16/60 1751  . buPROPion (WELLBUTRIN XL) 24 hr tablet 150 mg  150 mg Oral Daily Clyde Canterbury, MD   150 mg at 08/03/20 1749  . cholecalciferol (VITAMIN D3) tablet 1,000 Units  1,000 Units Oral Daily Clyde Canterbury, MD   1,000 Units at 08/03/20 1749  . dextrose 5 % and 0.45 % NaCl with KCl 40 mEq/L infusion   Intravenous Continuous Clyde Canterbury, MD 100 mL/hr at 08/03/20 1802 New Bag at 08/03/20 1802  . docusate sodium (COLACE) capsule 100 mg  100 mg Oral BID Clyde Canterbury, MD   100 mg at 08/03/20 1749  . HYDROcodone-acetaminophen (NORCO/VICODIN) 5-325 MG per tablet 1-2 tablet  1-2 tablet Oral Q4H PRN Clyde Canterbury, MD   2 tablet at 08/03/20 1400  . HYDROcodone-acetaminophen (NORCO/VICODIN) 5-325 MG per tablet           . HYDROcodone-acetaminophen (NORCO/VICODIN) 5-325 MG per tablet           . insulin aspart (novoLOG) injection 0-15 Units  0-15 Units Subcutaneous TID WC Clyde Canterbury, MD   8 Units at 08/03/20 1751  . metFORMIN (GLUCOPHAGE-XR) 24 hr tablet 500 mg  500 mg Oral BID WC Clyde Canterbury, MD   500 mg at 08/03/20 1749  . mirabegron ER (MYRBETRIQ) tablet 25 mg  25 mg Oral Daily Clyde Canterbury, MD   25 mg at 08/03/20 1750  . morphine 2 MG/ML injection 2-4 mg  2-4 mg Intravenous Q2H PRN Clyde Canterbury, MD      . ondansetron [facility]) tablet 4 mg  4 mg Oral Q4H PRN Clyde Canterbury, MD       Or  . ondansetron [facility])  injection 4 mg  4 mg Intravenous Q4H PRN Clyde Canterbury, MD      . rOPINIRole (REQUIP) tablet 0.5 mg  0.5 mg Oral TID Clyde Canterbury, MD   0.5 mg at 08/03/20 1750  .  Medications Prior to Admission  Medication Sig Dispense Refill  . ACCU-CHEK GUIDE test strip     . Accu-Chek Softclix Lancets lancets     . acetaminophen (TYLENOL) 500 MG tablet Take 500-1,000 mg by mouth every 6 (six) hours as needed (for pain.).    Marland Kitchen Alcohol Swabs (B-D SINGLE USE SWABS REGULAR) PADS     . aspirin EC 81 MG tablet Take 1 tablet (81 mg total) by mouth daily. (take at least one hour prior to any ibuprofen)    . atorvastatin (LIPITOR) 40 MG tablet Take 1 tablet (40 mg total) by mouth at bedtime. 90 tablet 3  . Blood Glucose Monitoring Suppl (ACCU-CHEK GUIDE ME) w/Device KIT     . buPROPion (WELLBUTRIN XL) 150 MG 24 hr tablet TAKE 1 TABLET(150 MG) BY MOUTH DAILY (Patient taking differently: Take 150 mg by mouth daily. ) 90 tablet 1  . cholecalciferol (VITAMIN D3) 25 MCG (1000 UNIT) tablet Take 1,000 Units by mouth daily.    Marland Kitchen  ibuprofen (ADVIL) 200 MG tablet Take 400 mg by mouth every 8 (eight) hours as needed (for pain.).    Marland Kitchen metFORMIN (GLUCOPHAGE-XR) 500 MG 24 hr tablet TAKE 1 TABLET(500 MG) BY MOUTH BID (Patient taking differently: Take 500 mg by mouth in the morning and at bedtime. ) 180 tablet 0  . mirabegron ER (MYRBETRIQ) 25 MG TB24 tablet Take 1 tablet (25 mg total) by mouth daily. 30 tablet 11  . naproxen sodium (ALEVE) 220 MG tablet Take 220 mg by mouth 2 (two) times daily as needed.     Marland Kitchen rOPINIRole (REQUIP) 0.25 MG tablet Take 0.5 mg by mouth in the morning, at noon, and at bedtime.     . solifenacin (VESICARE) 5 MG tablet Take 1 tablet (5 mg total) by mouth daily. (Patient taking differently: Take 5 mg by mouth daily after supper. ) 30 tablet 11  . vitamin B-12 (CYANOCOBALAMIN) 500 MCG tablet Take 500 mcg by mouth daily.      OBJECTIVE:  PHYSICAL EXAM  Vitals: Blood pressure 128/75, pulse 100,  temperature 98.2 F (36.8 C), temperature source Oral, resp. rate 20, SpO2 96 %.. General: Well-developed, Well-nourished in no acute distress Mood: Mood and affect well adjusted, pleasant and cooperative. Orientation: Grossly alert and oriented. Vocal Quality: No hoarseness. Communicates verbally. head and Face: NCAT. No facial asymmetry. No visible skin lesions. No significant facial scars. No tenderness with sinus percussion. Facial strength normal and symmetric. Neck: Supple and symmetric with no palpable masses, tenderness or crepitance. The trachea is midline. Wound clean/dry/intact and no hematoma. Drain with serosanguinous fluid. Respiratory: Normal respiratory effort without labored breathing.  MEDICAL DECISION MAKING: Data Review:  Results for orders placed or performed during the hospital encounter of 08/03/20 (from the past 48 hour(s))  Glucose, capillary     Status: Abnormal   Collection Time: 08/03/20  6:12 AM  Result Value Ref Range   Glucose-Capillary 131 (H) 70 - 99 mg/dL    Comment: Glucose reference range applies only to samples taken after fasting for at least 8 hours.  Glucose, capillary     Status: Abnormal   Collection Time: 08/03/20 10:17 AM  Result Value Ref Range   Glucose-Capillary 162 (H) 70 - 99 mg/dL    Comment: Glucose reference range applies only to samples taken after fasting for at least 8 hours.  Glucose, capillary     Status: Abnormal   Collection Time: 08/03/20  4:29 PM  Result Value Ref Range   Glucose-Capillary 276 (H) 70 - 99 mg/dL    Comment: Glucose reference range applies only to samples taken after fasting for at least 8 hours.   Comment 1 Radiation protection practitioner   . No results found..   ASSESSMENT: Doing well after left hemithyroidectomy  PLAN: Observe overnight. Drain likely to be removed in AM.    [name], MD 08/03/2020 6:19 PMPatient ID: [name], female   DOB: 1941-10-05, 79 y.o.   MRN: [id number]

## 2020-08-03 NOTE — Anesthesia Procedure Notes (Signed)
Procedure Name: Intubation Performed by: Mohammed Kindle, CRNA Pre-anesthesia Checklist: Patient identified, Emergency Drugs available, Suction available and Patient being monitored Patient Re-evaluated:Patient Re-evaluated prior to induction Oxygen Delivery Method: Circle system utilized Preoxygenation: Pre-oxygenation with 100% oxygen Induction Type: IV induction Ventilation: Mask ventilation without difficulty Laryngoscope Size: McGraph and 3 Grade View: Grade II Tube type: Oral Tube size: 6.5 mm Number of attempts: 1 Airway Equipment and Method: Stylet Placement Confirmation: ETT inserted through vocal cords under direct vision,  positive ETCO2,  breath sounds checked- equal and bilateral and CO2 detector Secured at: 21 cm Tube secured with: Tape Dental Injury: Teeth and Oropharynx as per pre-operative assessment  Difficulty Due To: Difficulty was anticipated, Difficult Airway- due to reduced neck mobility and Difficult Airway-  due to edematous airway Future Recommendations: Recommend- induction with short-acting agent, and alternative techniques readily available

## 2020-08-03 NOTE — OR Nursing (Signed)
Message left for daughter.  "Mother is doing good, just waiting on room assignment."

## 2020-08-04 ENCOUNTER — Encounter: Payer: Self-pay | Admitting: Otolaryngology

## 2020-08-04 DIAGNOSIS — E042 Nontoxic multinodular goiter: Secondary | ICD-10-CM | POA: Diagnosis not present

## 2020-08-04 LAB — GLUCOSE, CAPILLARY
Glucose-Capillary: 197 mg/dL — ABNORMAL HIGH (ref 70–99)
Glucose-Capillary: 143 mg/dL — ABNORMAL HIGH (ref 70–99)

## 2020-08-04 LAB — HEMOGLOBIN A1C
Hgb A1c MFr Bld: 7.2 % — ABNORMAL HIGH (ref 4.8–5.6)
Mean Plasma Glucose: 159.94 mg/dL

## 2020-08-04 MED ORDER — BACITRACIN ZINC 500 UNIT/GM EX OINT
1.0000 | TOPICAL_OINTMENT | Freq: Three times a day (TID) | CUTANEOUS | 0 refills | Status: DC
Start: 2020-08-04 — End: 2020-12-22

## 2020-08-04 MED ORDER — HYDROCODONE-ACETAMINOPHEN 5-325 MG PO TABS: 1.0000 | ORAL_TABLET | Freq: Four times a day (QID) | ORAL | 0 refills | Status: DC | PRN

## 2020-08-04 MED ORDER — DOCUSATE SODIUM 100 MG PO CAPS: 100.0000 mg | ORAL_CAPSULE | Freq: Two times a day (BID) | ORAL | 0 refills | Status: DC

## 2020-08-04 NOTE — Discharge Summary (Addendum)
Whitney [name], Whitney [name] [id number] 02/18/1941 Whitney Nearing, MD    SUBJECTIVE: This 79 y.o. year old female is status post left hemithyroidectomy yesterday. Drain output 20 over past 10 hours, so drain removed. Doing well with good pain control. Swallowing well and notably improved with mass gone. Voice is strong.  Medications:  Current Facility-Administered Medications  Medication Dose Route Frequency Provider Last Rate Last Admin  . atorvastatin (LIPITOR) tablet 40 mg  40 mg Oral Leodis Binet, MD   40 mg at 08/03/20 2122  . bacitracin ointment 1 application  1 application Topical W3U Clyde Canterbury, MD   1 application at 88/28/00 0533  . buPROPion (WELLBUTRIN XL) 24 hr tablet 150 mg  150 mg Oral Daily Clyde Canterbury, MD   150 mg at 08/03/20 1749  . cholecalciferol (VITAMIN D3) tablet 1,000 Units  1,000 Units Oral Daily Clyde Canterbury, MD   1,000 Units at 08/03/20 1749  . dextrose 5 % and 0.45 % NaCl with KCl 40 mEq/L infusion   Intravenous Continuous Clyde Canterbury, MD 100 mL/hr at 08/04/20 0316 New Bag at 08/04/20 0316  . docusate sodium (COLACE) capsule 100 mg  100 mg Oral BID Clyde Canterbury, MD   100 mg at 08/03/20 1749  . HYDROcodone-acetaminophen (NORCO/VICODIN) 5-325 MG per tablet 1-2 tablet  1-2 tablet Oral Q4H PRN Clyde Canterbury, MD   1 tablet at 08/03/20 2120  . insulin aspart (novoLOG) injection 0-15 Units  0-15 Units Subcutaneous TID WC Clyde Canterbury, MD   8 Units at 08/03/20 1751  . insulin aspart (novoLOG) injection 0-5 Units  0-5 Units Subcutaneous QHS Clyde Canterbury, MD   3 Units at 08/03/20 2305  . metFORMIN (GLUCOPHAGE-XR) 24 hr tablet 500 mg  500 mg Oral BID WC Clyde Canterbury, MD   500 mg at 08/03/20 1749  . mirabegron ER (MYRBETRIQ) tablet 25 mg  25 mg Oral Daily Clyde Canterbury, MD   25 mg at 08/03/20 1750  . morphine 2 MG/ML injection 2-4 mg  2-4 mg Intravenous Q2H PRN Clyde Canterbury, MD      . ondansetron [facility]) tablet 4 mg  4 mg Oral Q4H PRN Clyde Canterbury, MD       Or  .  ondansetron [facility]) injection 4 mg  4 mg Intravenous Q4H PRN Clyde Canterbury, MD      . rOPINIRole (REQUIP) tablet 0.5 mg  0.5 mg Oral TID Clyde Canterbury, MD   0.5 mg at 08/03/20 2123  .  Medications Prior to Admission  Medication Sig Dispense Refill  . ACCU-CHEK GUIDE test strip     . Accu-Chek Softclix Lancets lancets     . acetaminophen (TYLENOL) 500 MG tablet Take 500-1,000 mg by mouth every 6 (six) hours as needed (for pain.).    Marland Kitchen Alcohol Swabs (B-D SINGLE USE SWABS REGULAR) PADS     . aspirin EC 81 MG tablet Take 1 tablet (81 mg total) by mouth daily. (take at least one hour prior to any ibuprofen)    . atorvastatin (LIPITOR) 40 MG tablet Take 1 tablet (40 mg total) by mouth at bedtime. 90 tablet 3  . Blood Glucose Monitoring Suppl (ACCU-CHEK GUIDE ME) w/Device KIT     . buPROPion (WELLBUTRIN XL) 150 MG 24 hr tablet TAKE 1 TABLET(150 MG) BY MOUTH DAILY (Patient taking differently: Take 150 mg by mouth daily. ) 90 tablet 1  . cholecalciferol (VITAMIN D3) 25 MCG (1000 UNIT) tablet Take 1,000 Units by mouth daily.    Marland Kitchen ibuprofen (ADVIL) 200 MG  tablet Take 400 mg by mouth every 8 (eight) hours as needed (for pain.).    Marland Kitchen metFORMIN (GLUCOPHAGE-XR) 500 MG 24 hr tablet TAKE 1 TABLET(500 MG) BY MOUTH BID (Patient taking differently: Take 500 mg by mouth in the morning and at bedtime. ) 180 tablet 0  . mirabegron ER (MYRBETRIQ) 25 MG TB24 tablet Take 1 tablet (25 mg total) by mouth daily. 30 tablet 11  . naproxen sodium (ALEVE) 220 MG tablet Take 220 mg by mouth 2 (two) times daily as needed.     Marland Kitchen rOPINIRole (REQUIP) 0.25 MG tablet Take 0.5 mg by mouth in the morning, at noon, and at bedtime.     . solifenacin (VESICARE) 5 MG tablet Take 1 tablet (5 mg total) by mouth daily. (Patient taking differently: Take 5 mg by mouth daily after supper. ) 30 tablet 11  . vitamin B-12 (CYANOCOBALAMIN) 500 MCG tablet Take 500 mcg by mouth daily.      OBJECTIVE:  PHYSICAL EXAM  Vitals: Blood pressure (!)  98/57, pulse 71, temperature 98.1 F (36.7 C), temperature source Oral, resp. rate 20, SpO2 96 %.. General: Well-developed, Well-nourished in no acute distress Mood: Mood and affect well adjusted, pleasant and cooperative. Orientation: Grossly alert and oriented. Vocal Quality: No hoarseness. Communicates verbally. head and Face: NCAT. No facial asymmetry. No visible skin lesions. No significant facial scars. No tenderness with sinus percussion. Facial strength normal and symmetric. Neck: Supple and symmetric with no palpable masses, tenderness or crepitance. The trachea is midline. Wound clean/dry/intact. Drain removed. No hematoma. Respiratory: Normal respiratory effort without labored breathing.   MEDICAL DECISION MAKING: Data Review:  Results for orders placed or performed during the hospital encounter of 08/03/20 (from the past 48 hour(s))  Glucose, capillary     Status: Abnormal   Collection Time: 08/03/20  6:12 AM  Result Value Ref Range   Glucose-Capillary 131 (H) 70 - 99 mg/dL    Comment: Glucose reference range applies only to samples taken after fasting for at least 8 hours.  Glucose, capillary     Status: Abnormal   Collection Time: 08/03/20 10:17 AM  Result Value Ref Range   Glucose-Capillary 162 (H) 70 - 99 mg/dL    Comment: Glucose reference range applies only to samples taken after fasting for at least 8 hours.  Glucose, capillary     Status: Abnormal   Collection Time: 08/03/20  4:29 PM  Result Value Ref Range   Glucose-Capillary 276 (H) 70 - 99 mg/dL    Comment: Glucose reference range applies only to samples taken after fasting for at least 8 hours.   Comment 1 Notify RN   Glucose, capillary     Status: Abnormal   Collection Time: 08/03/20  9:27 PM  Result Value Ref Range   Glucose-Capillary 293 (H) 70 - 99 mg/dL    Comment: Glucose reference range applies only to samples taken after fasting for at least 8 hours.  Glucose, capillary     Status: Abnormal    Collection Time: 08/04/20  7:52 AM  Result Value Ref Range   Glucose-Capillary 197 (H) 70 - 99 mg/dL    Comment: Glucose reference range applies only to samples taken after fasting for at least 8 hours.  . No results found..   ASSESSMENT: S/p left hemithyroidectomy, doing well. Swallowing improved. Drain removed.  PLAN: D/c home. F/u in 1 week for suture removal. Bacitracin to wound BID, ice to neck prn. Hold ASA, NSAIDs for 5 days. No strenuous activity.  Whitney Nearing, MD 08/04/2020 8:12 AM

## 2020-08-04 NOTE — Final Progress Note (Signed)
Physician Final Progress Note  Patient ID: Whitney [name] MRN: [id number] DOB/AGE: [birth year] 79 y.o.  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  Discharge Diagnoses:  Active Problems:   Thyroid goiter    Consults: none  Significant Findings/ Diagnostic Studies: path pending  Procedures: Left hemithyroidectomy  Discharge Condition: Good  Disposition: Discharge disposition: 01-Home or Self Care       Diet: Diabetic regular  Discharge Activity: no strenuous activity or heavy lifting  Discharge Instructions    Call MD for:  difficulty breathing, headache or visual disturbances   Complete by: As directed    Call MD for:  redness, tenderness, or signs of infection (pain, swelling, redness, odor or green/yellow discharge around incision site)   Complete by: As directed    Call MD for:  severe uncontrolled pain   Complete by: As directed    Call MD for:  temperature >100.4   Complete by: As directed    Diet Carb Modified   Complete by: As directed    Discharge instructions   Complete by: As directed    Bacitracin to wound twice daily for 5 days. Wound may be cleaned as needed with hydrogen peroxide. Patient may shower. Call for fever, excessive swelling at wound site, or uncontrolled pain. Take pain medications as prescribed. An ice pack may be applied to control minor swelling and as needed for comfort. Follow-up in 1 week for suture removal. May resume use of aspiriin and other NSAIDs as needed (ibuprofen, naprosyn) in 5 days.   Increase activity slowly   Complete by: As directed    No dressing needed   Complete by: As directed      Allergies as of 08/04/2020   No Known Allergies     Medication List    STOP taking these medications   aspirin EC 81 MG tablet   ibuprofen 200 MG tablet Commonly known as: ADVIL   naproxen sodium 220 MG tablet Commonly  known as: ALEVE     TAKE these medications   Accu-Chek Guide Me w/Device Kit   Accu-Chek Guide test strip Generic drug: glucose blood   Accu-Chek Softclix Lancets lancets   acetaminophen 500 MG tablet Commonly known as: TYLENOL Take 500-1,000 mg by mouth every 6 (six) hours as needed (for pain.).   atorvastatin 40 MG tablet Commonly known as: LIPITOR Take 1 tablet (40 mg total) by mouth at bedtime.   B-D SINGLE USE SWABS REGULAR Pads   bacitracin ointment Apply 1 application topically every 8 (eight) hours.   buPROPion 150 MG 24 hr tablet Commonly known as: WELLBUTRIN XL TAKE 1 TABLET(150 MG) BY MOUTH DAILY What changed:   how much to take  how to take this  when to take this  additional instructions   cholecalciferol 25 MCG (1000 UNIT) tablet Commonly known as: VITAMIN D3 Take 1,000 Units by mouth daily.   docusate sodium 100 MG capsule Commonly known as: COLACE Take 1 capsule (100 mg total) by mouth 2 (two) times daily.   HYDROcodone-acetaminophen 5-325 MG tablet Commonly known as: NORCO/VICODIN Take 1-2 tablets by mouth every 6 (six) hours as needed for moderate pain.   metFORMIN 500 MG 24 hr tablet Commonly known as: GLUCOPHAGE-XR TAKE 1 TABLET(500 MG) BY MOUTH BID What changed:   how much to take  how to take this  when to take this  additional instructions   mirabegron ER 25 MG Tb24  tablet Commonly known as: Myrbetriq Take 1 tablet (25 mg total) by mouth daily.   rOPINIRole 0.25 MG tablet Commonly known as: REQUIP Take 0.5 mg by mouth in the morning, at noon, and at bedtime.   solifenacin 5 MG tablet Commonly known as: VESICARE Take 1 tablet (5 mg total) by mouth daily. What changed: when to take this   vitamin B-12 500 MCG tablet Commonly known as: CYANOCOBALAMIN Take 500 mcg by mouth daily.            Discharge Care Instructions  (From admission, onward)         Start     Ordered   08/04/20 0000  No dressing needed         08/04/20 3582           Total time spent taking care of this patient: 20 minutes  Signed: [name] 08/04/2020, 8:16 AM

## 2020-08-04 NOTE — Care Management Obs Status (Signed)
MEDICARE OBSERVATION STATUS NOTIFICATION   Patient Details  Name: Whitney [name] MRN: [id number] Date of Birth: 02/06/1941   Medicare Observation Status Notification Given:  No (admitted obs less than 24 hours)    Chapman Fitch, RN 08/04/2020, 8:26 AM

## 2020-08-04 NOTE — Progress Notes (Signed)
   08/03/20 0800  Clinical Encounter Type  Visited With Family  Visit Type Initial  Referral From Chaplain  Consult/Referral To Chaplain  While rounding SDS waiting area, chaplain briefly visited with daughter, Elnita Maxwell, checking to see how she was doing while waiting. Daughter said she was fine.

## 2020-08-04 NOTE — Progress Notes (Signed)
Discharge instructions given to daughter and patient. Verbalized understanding. No acute distress at this time. Patient is eating lunch and will call for a wheelchair when she is finished.

## 2020-08-05 NOTE — Anesthesia Postprocedure Evaluation (Signed)
Anesthesia Post Note  Patient: Whitney [name]  Procedure(s) Performed: Gwynneth Munson (Left )  Patient location during evaluation: PACU Anesthesia Type: General Level of consciousness: awake and alert and oriented Pain management: pain level controlled Vital Signs Assessment: post-procedure vital signs reviewed and stable Respiratory status: spontaneous breathing Cardiovascular status: blood pressure returned to baseline Anesthetic complications: no   No complications documented.   Last Vitals:  Vitals:   08/03/20 2301 08/04/20 0544  BP: 121/69 (!) 98/57  Pulse: 99 71  Resp: 20 20  Temp: 36.9 C 36.7 C  SpO2: 94% 96%    Last Pain:  Vitals:   08/04/20 0823  TempSrc:   PainSc: 0-No pain                 Georgetta Crafton

## 2020-08-09 LAB — SURGICAL PATHOLOGY

## 2020-08-25 ENCOUNTER — Encounter: Admission: RE | Payer: Self-pay | Source: Home / Self Care

## 2020-08-25 ENCOUNTER — Ambulatory Visit: Admission: RE | Admit: 2020-08-25 | Source: Home / Self Care | Admitting: Ophthalmology

## 2020-08-25 SURGERY — PHACOEMULSIFICATION, CATARACT, WITH IOL INSERTION
Anesthesia: Topical | Laterality: Left

## 2020-08-31 NOTE — Progress Notes (Signed)
Patient ID: Whitney [name], female    DOB: [birth year], 79 y.o.   MRN: [id number]  PCP: Delsa Grana, PA-C  Chief Complaint  Patient presents with  . Leg Swelling    Subjective:   Anushree Dorsi is a 79 y.o. female, presents to clinic with CC of the following:  HPI  Pt presents for b/l LE edema.  It started about 3 weeks ago and has been gradually increasing.  She elevates her legs a lot throughout the day and that does not help.  She generally eats low salt diet.  She is not doing much at home, and when not having legs elevated she is in a seated position.   Ms Donahoo endorses associated fatigue and notes that the swelling has been ascending up her legs.  She is currently wearing loose sandles with velcrow straps that are loosely looped and not fastened and her pedal edema is so severe she has indentations and generalized discomfort. She denies any swelling to upper extremities, or anywhere else.   Denies associated weight change, orthopnea, palpitations, DOE, urinary/urine output changes, change in mood, bowels, hair or skin, cold intolerance. 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  She did a one week post op and suture removal visit with Dr. Virgia Land - which is not available in chart - pt states she was doing well and the plan was to check labs with Dr. Virgia Land in 4 weeks - this is due one week from now.  Pt went with her sister to [facility] to see Erin Sons with neurology and CMP and CBC was done.  They called ENT office and asked if Dr. Virgia Land would see her for swelling and she was told to come to PCP  Lab Results  Component Value Date   TSH 1.39 07/22/2019   Lab Results  Component Value Date   CREATININE 0.58 08/01/2020   Lab Results  Component Value Date   BUN 14 08/01/2020   Lab Results  Component Value Date   GFRNONAA  >60 08/01/2020   Hemoglobin  Date Value Ref Range Status  01/05/2020 12.2 11.7 - 15.5 g/dL Final  11/17/2019 11.6 (L) 12.0 - 15.0 g/dL Final  07/22/2019 12.8 11.7 - 15.5 g/dL Final  11/21/2018 12.9 12.0 - 15.0 g/dL Final   Neuro f/up visit 08/30/2020: Reviewed OV which is copied: 1. Suspected Parkinson's Disease- responsive to ropinirole, did not tolerate Sinemet. decreased eye blinking + shuffling gait + mild bradykinesia bilateral left > right + cog wheeling left > right - stable  Home Health for falls and Parkinson's Disease  Ropinirole 0.5 mg tablet Start with 1 tablet three times daily.  Add 0.25 mg tablet to this to help with walking. This will make a total of ropinirole 0.75 mg three times daily If ended you can move these to 1 hour closer  - Continue using rolling walker with a seat for ambulation.   2. 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 Depression - per primary care physician  - Taking Wellbutrin 150 mg daily(patient taking 250 mg)  4. Vitamin D def and Vitamin B 12 def (reviweded  05/10/2020) B12-1500, Vitamin D-31.9  5. 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 Explain you are sleeping a day at time. This can lead to abnormalities such as fatigue, slow movement and slow speech, cold intolerance, constipation, weight gain (but not morbid obesity), and puffy facies, enlargement of the tongue, and hoarseness. These changes are often more easily recognized in young patients, and they may be attributed to aging in older patients.  Labs done today CMP and CBC    Patient Active Problem List   Diagnosis Date Noted  . Thyroid  goiter 08/03/2020  . Impaired mobility and ADLs 04/15/2020  . Vitamin D deficiency 07/20/2019  . 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[place]) 07/20/2019  . At high risk for falls 07/20/2019  . Parkinson disease ([place]) 01/15/2019  . Hyperlipidemia 01/15/2019  . Diabetes mellitus type 2 in obese ([place]) 01/13/2019  . Overactive bladder 01/13/2019  . Obesity (BMI 30.0-34.9) 01/13/2019      Current Outpatient Medications:  .  ACCU-CHEK GUIDE test strip, , Disp: , Rfl:  .  Accu-Chek Softclix Lancets lancets, , Disp: , Rfl:  .  acetaminophen (TYLENOL) 500 MG tablet, Take 500-1,000 mg by mouth every 6 (six) hours as needed (for pain.)., Disp: , Rfl:  .  Alcohol Swabs (B-D SINGLE USE SWABS REGULAR) PADS, , Disp: , Rfl:  .  atorvastatin (LIPITOR) 40 MG tablet, Take 1 tablet (40 mg total) by mouth at bedtime., Disp: 90 tablet, Rfl: 3 .  bacitracin ointment, Apply 1 application topically every 8 (eight) hours., Disp: 120 g, Rfl: 0 .  Blood Glucose Monitoring Suppl (ACCU-CHEK GUIDE ME) w/Device KIT, , Disp: , Rfl:  .  buPROPion (WELLBUTRIN XL) 150 MG 24 hr tablet, TAKE 1 TABLET(150 MG) BY MOUTH DAILY, Disp: 90 tablet, Rfl: 1 .  cholecalciferol (VITAMIN D3) 25 MCG (1000 UNIT) tablet, Take 1,000 Units by mouth daily., Disp: , Rfl:  .  metFORMIN (GLUCOPHAGE-XR) 500 MG 24 hr tablet, Take 1 tablet (500 mg total) by mouth 2 (two) times daily with a meal. TAKE 1 TABLET(500 MG) BY MOUTH BID, Disp: 180 tablet, Rfl: 3 .  mirabegron ER (MYRBETRIQ) 25 MG TB24 tablet, Take 1 tablet (25 mg total) by mouth daily., Disp: 30 tablet, Rfl: 11 .  rOPINIRole (REQUIP) 0.25 MG tablet, Take 0.5 mg by mouth in the morning, at noon, and at bedtime. , Disp: , Rfl:  .  solifenacin (VESICARE) 5 MG tablet, Take 1 tablet (5 mg total) by mouth daily. (Patient taking differently: Take 5 mg by mouth daily after supper. ), Disp: 30 tablet, Rfl: 11 .  vitamin B-12 (CYANOCOBALAMIN) 500 MCG tablet, Take 500 mcg by mouth daily.,  Disp: , Rfl:    No Known Allergies   Social History   Tobacco Use  . Smoking status: Former Smoker    Packs/day: 2.00    Years: 40.00    Pack years: 80.00    Types: Cigarettes    Quit date: 12/10/2018    Years since quitting: 1.7  . Smokeless tobacco: Never Used  Vaping Use  . Vaping Use: Never used  Substance Use Topics  . Alcohol use: Never  . Drug use: Never      Chart Review Today: I personally reviewed active problem list, medication list, allergies, family history, social history, health maintenance, notes from last encounter, lab results, imaging with the patient/caregiver today.  Review of Systems 10 Systems reviewed and are negative for acute change except as noted in the HPI.     Objective:   Vitals:   09/01/20 1518  BP: 124/70  Pulse: 100  Resp: 16  Temp: 98.4 F (36.9 C)  TempSrc: Oral  SpO2: 99%  Weight: 167 lb 12.8 oz (76.1 kg)  Height: '5\' 2"'  (1.575 m)    Body mass index is 30.69 kg/m.  Physical Exam Vitals and nursing note reviewed.  Constitutional:      General: She is not in acute distress.    Appearance: Normal appearance. She is obese. She is not ill-appearing, toxic-appearing or diaphoretic.     Interventions: Face mask in place.     Comments: Pt took mask off shortly after I entered room  HENT:     Head: Normocephalic and atraumatic. No right periorbital erythema or left periorbital erythema. Hair is normal.     Nose: Nose normal.     Mouth/Throat:     Lips: Pink.     Mouth: Mucous membranes are moist.     Pharynx: Oropharynx is clear.  Eyes:     General: Lids are normal.        Right eye: No discharge.        Left eye: No discharge.     Conjunctiva/sclera: Conjunctivae normal.  Neck:     Comments: Incision to midline low neck clean, dry, intact, no erythema or edema, phonation clear, trachea midline Cardiovascular:     Rate and Rhythm: Normal rate and regular rhythm.     Pulses: Normal pulses.     Heart sounds: Normal  heart sounds. No murmur heard.  No friction rub. No gallop.      Comments: B/L pedal and pretibial pitting edema Pulmonary:     Effort: Pulmonary effort is normal. No respiratory distress.     Breath sounds: Normal breath sounds. No wheezing.  Abdominal:     General: Bowel sounds are normal. There is no distension.     Palpations: Abdomen is soft.     Tenderness: There is no abdominal tenderness.  Musculoskeletal:     Cervical back: Normal range of motion.     Right lower leg: 2+ Pitting Edema present.     Left lower leg: 2+ Pitting Edema present.  Skin:    General: Skin is warm and dry.     Capillary Refill: Capillary refill takes less than 2 seconds.     Coloration: Skin is not jaundiced or pale.  Neurological:     Mental Status: She is alert.     Gait: Gait abnormal.  Psychiatric:        Mood and Affect: Mood normal.        Behavior: Behavior normal. Behavior is cooperative.        Thought Content: Thought content normal.           Assessment & Plan:   1. Bilateral lower extremity edema CMP and CBC per neuro a few hours ago - will watch for those results through care everywhere R/O renal/liver abnormalities - TSH No cardiac sx in addition to LE edema - did not feel that we needed to work up CHF  Suspect hypothyroid s/p hemithyroidectomy with LE edema, weight gain and fatigue  Encouraged pt to avoid prolonged sitting/stasis, elevate legs, low salt, compression socks  2. S/P partial thyroidectomy - TSH  Pt has not been in for her routine f/up regularly with her fracture and rehabilitation this year and  now this recent surgery.  Her T2DM was very well controlled earlier this year but was recently rechecked and A1C was above goal.  She is due for a lot of her DM care and has multiple care gaps.  We did not have time to address all this today, but I have reviewed her meds, labs and chart and we will need to have her come back and do DM foot exam, additional needed  labs/vaccines etc.       Delsa Grana, PA-C 09/01/20 3:24 PM

## 2020-09-01 ENCOUNTER — Other Ambulatory Visit: Payer: Self-pay

## 2020-09-01 ENCOUNTER — Ambulatory Visit (INDEPENDENT_AMBULATORY_CARE_PROVIDER_SITE_OTHER): Payer: Medicare PPO | Admitting: Family Medicine

## 2020-09-01 ENCOUNTER — Encounter: Payer: Self-pay | Admitting: Family Medicine

## 2020-09-01 VITALS — BP 124/70 | HR 100 | Temp 98.4°F | Resp 16 | Ht 62.0 in | Wt 167.8 lb

## 2020-09-01 DIAGNOSIS — E890 Postprocedural hypothyroidism: Secondary | ICD-10-CM

## 2020-09-01 DIAGNOSIS — Z9889 Other specified postprocedural states: Secondary | ICD-10-CM

## 2020-09-01 DIAGNOSIS — R600 Localized edema: Secondary | ICD-10-CM | POA: Diagnosis not present

## 2020-09-01 MED ORDER — METFORMIN HCL ER 500 MG PO TB24: 500.0000 mg | ORAL_TABLET | Freq: Two times a day (BID) | ORAL | 3 refills | Status: DC

## 2020-09-02 LAB — TSH: TSH: 3.03 mIU/L (ref 0.40–4.50)

## 2020-09-02 NOTE — Progress Notes (Signed)
I called pt with TSH lab - still in normal range - she has not gotten a call yet from [facility] regarding lab results done there  Will need to f/up with her early next week She said legs are still swollen. She has not yet tried her compression socks - she was encouraged to do so  I again reviewed concerning signs and sx that the would need to get more urgent in person eval for - including but not limited to SOB, palpitations,  CP, orthopnea, infection or drainage from legs - and she verbalized understanding.  Call 09/02/20 5:41 PM  Danelle Berry, PA-C

## 2020-09-08 ENCOUNTER — Telehealth: Payer: Self-pay

## 2020-09-08 NOTE — Telephone Encounter (Signed)
Copied from CRM [id number]. Topic: General - Inquiry >> Sep 08, 2020 11:35 AM Daphine Deutscher D wrote: Reason for CRM: Pt called saying she seen her neurologist at [facility] yesterday and she had some labs done there and they told her some of her labs was off and she needed to contact her primary doctor  CB#  [phone]

## 2020-09-08 NOTE — Telephone Encounter (Signed)
Please review labs from [place] [facility]

## 2020-09-09 NOTE — Telephone Encounter (Signed)
  Please have her schedule an appt   Danelle Berry, PA-C

## 2020-09-12 NOTE — Progress Notes (Signed)
Name: Whitney [name]   MRN: [id number]    DOB: 1941/03/27   Date:09/13/2020       Progress Note  Chief Complaint  Patient presents with  . Follow-up    abnormal labs     Subjective:   Whitney [name] is a 79 y.o. female, presents to clinic for f/up on abnormal labs drawn by [facility] clinic -a chemistry and CBC were drawn shortly after patient presented for follow-up, the nurse practitioner with neurology at [facility] was concerned about myxedema, after partial thyroidectomy a month prior.  Patient did report fatigue and lower extremity edema.  Followed up a few days later here with me at her last office visit on 9/23.  I did check her TSH at that time it was normal.  Patient reports that she has worsening of her swelling and states that it has moved up of her legs and to her thighs.  It does appear improved from when I saw her about 12 days ago.  She is daughter is here with her today.  Patient had gained quite a bit of weight after her partial thyroidectomy about 15 pounds weight was gained between August 23 and September 23, she is only gained 1 more pound since that time.  She was having some difficulty swallowing with the very large goiter that was shifting her trachea and other anatomy.  She has been eating more.  Her sister who I spoke with shortly afterwards says she has been eating everything she can.  In the last 2 weeks she has only gained about 1 pound Wt Readings from Last 5 Encounters:  09/13/20 168 lb 12.8 oz (76.6 kg)  09/01/20 167 lb 12.8 oz (76.1 kg)  08/01/20 152 lb (68.9 kg)  07/08/20 147 lb (66.7 kg)  06/29/20 147 lb (66.7 kg)   BMI Readings from Last 5 Encounters:  09/13/20 30.87 kg/m  09/01/20 30.69 kg/m  08/01/20 27.80 kg/m  07/08/20 26.89 kg/m  06/29/20 26.89 kg/m   Pts LE edema - appears improved, she is wearing the same sandals as her last office visit however this time they're able to be velcroed and the straps close completely.  She has continued to  elevate her legs, has worn her compression socks a few times and is trying to walk more. She denies chest pain, chest pressure, palpitations, orthopnea, DOE, PND.  Weight stable compared to 2 weeks ago.   Other abnormal labs - pts blood sugar was 268    Did review the operative note and records from her surgery there was estimated 500 cc blood loss. Patient does have a history of mild anemia in the past however last labs that I can see prior to surgery her hemoglobin was 12.2.  When [facility] checked her labs on 923 her hemoglobin was 9.6, hematocrit 31.3, MCV normal at 85.5 and WBC is 3.2, platelets low 124  Hemoglobin  Date Value Ref Range Status  01/05/2020 12.2 11.7 - 15.5 g/dL Final  11/17/2019 11.6 (L) 12.0 - 15.0 g/dL Final  07/22/2019 12.8 11.7 - 15.5 g/dL Final   Pt denies tachycardia, SOB, pallor. She denies spontaneous bleeding or bruising, no melena, hematochezia, or hematuria  She does mention a fall a few days ago and she has a few bruises and Band-Aids on her right arm she states that this is irritated her right shoulder which she recently injured and had right humerus fx earlier this year.      Current Outpatient Medications:  .  acetaminophen (TYLENOL) 500  MG tablet, Take 500-1,000 mg by mouth every 6 (six) hours as needed (for pain.)., Disp: , Rfl:  .  atorvastatin (LIPITOR) 40 MG tablet, Take 1 tablet (40 mg total) by mouth at bedtime., Disp: 90 tablet, Rfl: 3 .  bacitracin ointment, Apply 1 application topically every 8 (eight) hours., Disp: 120 g, Rfl: 0 .  buPROPion (WELLBUTRIN XL) 150 MG 24 hr tablet, TAKE 1 TABLET(150 MG) BY MOUTH DAILY, Disp: 90 tablet, Rfl: 1 .  metFORMIN (GLUCOPHAGE-XR) 500 MG 24 hr tablet, Take 1 tablet (500 mg total) by mouth 2 (two) times daily with a meal. TAKE 1 TABLET(500 MG) BY MOUTH BID, Disp: 180 tablet, Rfl: 3 .  mirabegron ER (MYRBETRIQ) 25 MG TB24 tablet, Take 1 tablet (25 mg total) by mouth daily., Disp: 30 tablet, Rfl: 11 .   rOPINIRole (REQUIP) 0.25 MG tablet, Take 0.5 mg by mouth in the morning, at noon, and at bedtime. , Disp: , Rfl:  .  solifenacin (VESICARE) 5 MG tablet, Take 1 tablet (5 mg total) by mouth daily. (Patient taking differently: Take 5 mg by mouth daily after supper. ), Disp: 30 tablet, Rfl: 11 .  vitamin B-12 (CYANOCOBALAMIN) 500 MCG tablet, Take 500 mcg by mouth daily., Disp: , Rfl:  .  ACCU-CHEK GUIDE test strip, , Disp: , Rfl:  .  Accu-Chek Softclix Lancets lancets, , Disp: , Rfl:  .  Alcohol Swabs (B-D SINGLE USE SWABS REGULAR) PADS, , Disp: , Rfl:  .  Blood Glucose Monitoring Suppl (ACCU-CHEK GUIDE ME) w/Device KIT, , Disp: , Rfl:  .  cholecalciferol (VITAMIN D3) 25 MCG (1000 UNIT) tablet, Take 1,000 Units by mouth daily. (Patient not taking: Reported on 09/13/2020), Disp: , Rfl:   Patient Active Problem List   Diagnosis Date Noted  . 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 Impaired mobility and ADLs 04/15/2020  . Vitamin D deficiency 07/20/2019  . 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 At high risk for falls 07/20/2019  . Parkinson disease ([place]) 01/15/2019  . Hyperlipidemia 01/15/2019  . Diabetes mellitus type 2 in obese ([place]) 01/13/2019  . Overactive bladder 01/13/2019  . Obesity (BMI 30.0-34.9) 01/13/2019    Past Surgical History:  Procedure Laterality Date  . ABDOMINAL HYSTERECTOMY  1980  . BLADDER REPAIR  1980   post hysterectomy  . NECK SURGERY     anterior cervical disc fusion  . THYROIDECTOMY Left 08/03/2020   Procedure: Alfredia Ferguson;  Surgeon: Clyde Canterbury, MD;  Location: [facility] ORS;  Service: ENT;  Laterality: Left;    Family History  Problem Relation Age of Onset  . Stroke Mother   . Hypertension Mother   . Diabetes Father   . Heart disease Father   . Lung cancer Father   . Colon cancer Sister   . Diabetes Sister   . Kidney disease Daughter   . Kidney cancer Daughter   . Pulmonary embolism Sister   . Diabetes Sister   . Kidney disease  Sister   . Heart disease Sister   . Atrial fibrillation Sister   . Diabetes Brother   . Heart disease Brother   . AAA (abdominal aortic aneurysm) Brother   . Cancer Brother        skin    Social History   Tobacco Use  . Smoking status: Former Smoker    Packs/day: 2.00    Years: 40.00    Pack years: 80.00    Types: Cigarettes    Quit date: 12/10/2018  Years since quitting: 1.7  . Smokeless tobacco: Never Used  Vaping Use  . Vaping Use: Never used  Substance Use Topics  . Alcohol use: Never  . Drug use: Never     No Known Allergies  Health Maintenance  Topic Date Due  . Hepatitis C Screening  Never done  . DEXA SCAN  Never done  . MAMMOGRAM  03/15/2018  . FOOT EXAM  01/14/2020  . URINE MICROALBUMIN  01/14/2020  . INFLUENZA VACCINE  07/10/2020  . TETANUS/TDAP  04/07/2021 (Originally 09/13/1960)  . PNA vac Low Risk Adult (2 of 2 - PPSV23) 04/15/2021 (Originally 12/10/2016)  . HEMOGLOBIN A1C  02/04/2021  . OPHTHALMOLOGY EXAM  06/07/2021  . COVID-19 Vaccine  Completed    Chart Review Today: I personally reviewed active problem list, medication list, allergies, family history, social history, health maintenance, notes from last encounter, lab results, imaging with the patient/caregiver today.   Review of Systems  10 Systems reviewed and are negative for acute change except as noted in the HPI.    Objective:   Vitals:   09/13/20 0935  BP: 118/78  Pulse: 100  Resp: 14  Temp: 97.8 F (36.6 C)  TempSrc: Oral  SpO2: 99%  Weight: 168 lb 12.8 oz (76.6 kg)  Height: '5\' 2"'  (1.575 m)    Body mass index is 30.87 kg/m.  Physical Exam Vitals and nursing note reviewed.  Constitutional:      General: She is not in acute distress.    Appearance: Normal appearance. She is obese. She is not ill-appearing, toxic-appearing or diaphoretic.     Interventions: Face mask in place.     Comments: Elderly, alert, pleasant, appears stated age  HENT:     Head: Normocephalic  and atraumatic. No right periorbital erythema or left periorbital erythema. Hair is normal.     Nose: Nose normal.     Mouth/Throat:     Lips: Pink.     Mouth: Mucous membranes are dry.     Pharynx: Oropharynx is clear.  Eyes:     General: Lids are normal. No scleral icterus.       Right eye: No discharge.        Left eye: No discharge.     Conjunctiva/sclera: Conjunctivae normal.     Pupils: Pupils are equal, round, and reactive to light.     Comments: No pallor  Neck:     Comments: Incision to midline low neck clean, dry, intact, no erythema or edema, phonation clear, trachea midline Cardiovascular:     Rate and Rhythm: Normal rate and regular rhythm.     Pulses: Normal pulses.     Heart sounds: Normal heart sounds. No murmur heard.  No friction rub. No gallop.      Comments: B/L pedal and pretibial pitting edema, improved from last OV, diffuse varicose and reticular veins to both legs Pulmonary:     Effort: Pulmonary effort is normal. No respiratory distress.     Breath sounds: Normal breath sounds. No stridor. No wheezing, rhonchi or rales.  Abdominal:     General: Bowel sounds are normal. There is no distension.     Palpations: Abdomen is soft.     Tenderness: There is no abdominal tenderness.  Musculoskeletal:     Cervical back: Normal range of motion.     Right lower leg: 1+ Pitting Edema present.     Left lower leg: 1+ Pitting Edema present.  Skin:    General: Skin is warm.  Capillary Refill: Capillary refill takes less than 2 seconds.     Coloration: Skin is not jaundiced or pale.     Findings: Bruising present.     Comments: Right forearm and elbow, scattered bruising and three bandaids applied   Neurological:     Mental Status: She is alert.     Gait: Gait abnormal.  Psychiatric:        Mood and Affect: Mood normal.        Behavior: Behavior normal. Behavior is cooperative.        Thought Content: Thought content normal.         Assessment & Plan:      ICD-10-CM   1. Anemia, unspecified type  D64.9 T4, free    Iron, TIBC and Ferritin Panel    CBC (INCLUDES DIFF/PLT) WITH PATHOLOGIST REVIEW   repeat labs, hemoglobin decreased from 12.2 in January to 9.6 on 9/23 with [facility] labs, possibly acute blood loss s/p surgery, bone marrow dysfunction?  Will get a blood smear today and do an iron panel She is still fatigued but otherwise no sx s/t anemia, and I currently have no concerns for continued blood loss    2. Bilateral lower extremity edema  R60.0 US Venous Img Lower Bilateral (DVT)    Brain natriuretic peptide    T4, free    CBC (INCLUDES DIFF/PLT) WITH PATHOLOGIST REVIEW    COMPLETE METABOLIC PANEL WITH GFR   Appears to be improving, will check BNP though I doubt CHF, no other sx and lungs clear, elevate legs, ambulate, wear compression socks, low salt no exertional symptoms that she has not very active right now, no orthopnea and her heart and lung exam is fairly unremarkable except for her lower extremity edema  it does sound like she has been very uncontrolled with her eating I encouraged her to really work on low-salt diet and continue walking she has not gained any more weight so that is also reassuring    3. S/P partial thyroidectomy  E89.0 T4, free   incision healing well, TSH was in normal range 12 days ago, will check free T4 Pt is mentating well, alert, not having troubles with her thought processes and does not feel slowed down or forgetful her mood is good and besides swelling and a little fatigue she does not have any other symptoms that I'm concerned for hypothyroid or myxedema.  With partial thyroidectomy I explained to her that her remaining right thyroid lobe will likely make sufficient hormones and we can check periodically if she is symptomatic.  Encouraged her also to follow-up with ENT if she would like to   4. Diabetes mellitus type 2 in obese ([facility])  M62.86 COMPLETE METABOLIC PANEL WITH GFR   E66.9    worsening  glycemic control - encouraged her to work on diet and slowly increase metormin dose  Instructions given to patient in AVS: Check your blood sugars in the morning before eating a few times a week  If they are over 200 increase your metformin dose to 1000 mg once a day and then 500 mg the other time of day with food  Her sister's appointment was shortly after hers today and she is on her PHI, her sister reports that she is eating honey buns and excessive amounts of junk food every day.   Lab Results  Component Value Date   HGBA1C 7.2 (H) 08/04/2020   we will have her back to recheck her A1c in November  5. Mixed hyperlipidemia  N81.7 COMPLETE METABOLIC PANEL WITH GFR    Lipid panel   due for lipid panel -she is compliant with atorvastatin daily she reported no side effects or concerns   6. Fall in home, initial encounter  W19.Merril Abbe    Y92.009    briefly mentions at end of visit that she fell at home and hurt her arm and right shoulder hurts again.  Abrasions appear to have good wound care, mild bruising, good right elbow ROM, encouraged her to fup with her orthopedist with right shoulder pain - recent fx and PT - would be best for ortho f/up    Return in about 1 month (around 10/14/2020) for f/up swelling, hyperglycemia.   Delsa Grana, PA-C 09/13/20 9:57 AM

## 2020-09-13 ENCOUNTER — Other Ambulatory Visit: Payer: Self-pay

## 2020-09-13 ENCOUNTER — Ambulatory Visit (INDEPENDENT_AMBULATORY_CARE_PROVIDER_SITE_OTHER): Payer: Medicare PPO | Admitting: Family Medicine

## 2020-09-13 ENCOUNTER — Encounter: Payer: Self-pay | Admitting: Family Medicine

## 2020-09-13 VITALS — BP 118/78 | HR 100 | Temp 97.8°F | Resp 14 | Ht 62.0 in | Wt 168.8 lb

## 2020-09-13 DIAGNOSIS — D649 Anemia, unspecified: Secondary | ICD-10-CM

## 2020-09-13 DIAGNOSIS — E1169 Type 2 diabetes mellitus with other specified complication: Secondary | ICD-10-CM | POA: Diagnosis not present

## 2020-09-13 DIAGNOSIS — R600 Localized edema: Secondary | ICD-10-CM | POA: Diagnosis not present

## 2020-09-13 DIAGNOSIS — Y92009 Unspecified place in unspecified non-institutional (private) residence as the place of occurrence of the external cause: Secondary | ICD-10-CM

## 2020-09-13 DIAGNOSIS — E669 Obesity, unspecified: Secondary | ICD-10-CM

## 2020-09-13 DIAGNOSIS — W19XXXA Unspecified fall, initial encounter: Secondary | ICD-10-CM

## 2020-09-13 DIAGNOSIS — E782 Mixed hyperlipidemia: Secondary | ICD-10-CM

## 2020-09-13 DIAGNOSIS — E890 Postprocedural hypothyroidism: Secondary | ICD-10-CM

## 2020-09-13 NOTE — Patient Instructions (Signed)
Check your blood sugars in the morning before eating a few times a week  If they are over 200 increase your metformin dose to 1000 mg once a day and then 500 mg the other time of day with food  I will call you with lab results and plan  You will hear from the imaging center about an ultrasound for your legs   Edema  Edema is when you have too much fluid in your body or under your skin. Edema may make your legs, feet, and ankles swell up. Swelling is also common in looser tissues, like around your eyes. This is a common condition. It gets more common as you get older. There are many possible causes of edema. Eating too much salt (sodium) and being on your feet or sitting for a long time can cause edema in your legs, feet, and ankles. Hot weather may make edema worse. Edema is usually painless. Your skin may look swollen or shiny. Follow these instructions at home:  Keep the swollen body part raised (elevated) above the level of your heart when you are sitting or lying down.  Do not sit still or stand for a long time.  Do not wear tight clothes. Do not wear garters on your upper legs.  Exercise your legs. This can help the swelling go down.  Wear elastic bandages or support stockings as told by your doctor.  Eat a low-salt (low-sodium) diet to reduce fluid as told by your doctor.  Depending on the cause of your swelling, you may need to limit how much fluid you drink (fluid restriction).  Take over-the-counter and prescription medicines only as told by your doctor. Contact a doctor if:  Treatment is not working.  You have heart, liver, or kidney disease and have symptoms of edema.  You have sudden and unexplained weight gain. Get help right away if:  You have shortness of breath or chest pain.  You cannot breathe when you lie down.  You have pain, redness, or warmth in the swollen areas.  You have heart, liver, or kidney disease and get edema all of a sudden.  You have a  fever and your symptoms get worse all of a sudden. Summary  Edema is when you have too much fluid in your body or under your skin.  Edema may make your legs, feet, and ankles swell up. Swelling is also common in looser tissues, like around your eyes.  Raise (elevate) the swollen body part above the level of your heart when you are sitting or lying down.  Follow your doctor's instructions about diet and how much fluid you can drink (fluid restriction). This information is not intended to replace advice given to you by your health care provider. Make sure you discuss any questions you have with your health care provider. Document Revised: 11/29/2017 Document Reviewed: 12/14/2016 Elsevier Patient Education  2020 [employer].

## 2020-09-14 LAB — IRON,TIBC AND FERRITIN PANEL
%SAT: 6 % (calc) — ABNORMAL LOW (ref 16–45)
Ferritin: 8 ng/mL — ABNORMAL LOW (ref 16–288)
Iron: 26 ug/dL — ABNORMAL LOW (ref 45–160)
TIBC: 407 mcg/dL (calc) (ref 250–450)

## 2020-09-14 LAB — CBC (INCLUDES DIFF/PLT) WITH PATHOLOGIST REVIEW
Absolute Monocytes: 303 cells/uL (ref 200–950)
Basophils Absolute: 19 cells/uL (ref 0–200)
Basophils Relative: 0.5 %
Eosinophils Absolute: 70 cells/uL (ref 15–500)
Eosinophils Relative: 1.9 %
HCT: 32.1 % — ABNORMAL LOW (ref 35.0–45.0)
Hemoglobin: 9.9 g/dL — ABNORMAL LOW (ref 11.7–15.5)
Lymphs Abs: 666 cells/uL — ABNORMAL LOW (ref 850–3900)
MCH: 25.8 pg — ABNORMAL LOW (ref 27.0–33.0)
MCHC: 30.8 g/dL — ABNORMAL LOW (ref 32.0–36.0)
MCV: 83.8 fL (ref 80.0–100.0)
MPV: 11.5 fL (ref 7.5–12.5)
Monocytes Relative: 8.2 %
Neutro Abs: 2642 cells/uL (ref 1500–7800)
Neutrophils Relative %: 71.4 %
Platelets: 148 10*3/uL (ref 140–400)
RBC: 3.83 10*6/uL (ref 3.80–5.10)
RDW: 14.2 % (ref 11.0–15.0)
Total Lymphocyte: 18 %
WBC: 3.7 10*3/uL — ABNORMAL LOW (ref 3.8–10.8)

## 2020-09-14 LAB — COMPLETE METABOLIC PANEL WITH GFR
AG Ratio: 1.4 (calc) (ref 1.0–2.5)
ALT: 21 U/L (ref 6–29)
AST: 27 U/L (ref 10–35)
Albumin: 3.7 g/dL (ref 3.6–5.1)
Alkaline phosphatase (APISO): 118 U/L (ref 37–153)
BUN/Creatinine Ratio: 24 (calc) — ABNORMAL HIGH (ref 6–22)
BUN: 14 mg/dL (ref 7–25)
CO2: 26 mmol/L (ref 20–32)
Calcium: 9.1 mg/dL (ref 8.6–10.4)
Chloride: 106 mmol/L (ref 98–110)
Creat: 0.58 mg/dL — ABNORMAL LOW (ref 0.60–0.93)
GFR, Est African American: 102 mL/min/{1.73_m2} (ref >=60)
GFR, Est Non African American: 88 mL/min/{1.73_m2} (ref >=60)
Globulin: 2.6 g/dL (calc) (ref 1.9–3.7)
Glucose, Bld: 129 mg/dL — ABNORMAL HIGH (ref 65–99)
Potassium: 4.2 mmol/L (ref 3.5–5.3)
Sodium: 140 mmol/L (ref 135–146)
Total Bilirubin: 0.6 mg/dL (ref 0.2–1.2)
Total Protein: 6.3 g/dL (ref 6.1–8.1)

## 2020-09-14 LAB — LIPID PANEL
Cholesterol: 127 mg/dL (ref <200)
HDL: 63 mg/dL (ref >=50)
LDL Cholesterol (Calc): 49 mg/dL (calc)
Non-HDL Cholesterol (Calc): 64 mg/dL (calc) (ref <130)
Total CHOL/HDL Ratio: 2 (calc) (ref <5.0)
Triglycerides: 66 mg/dL (ref <150)

## 2020-09-14 LAB — T4, FREE: Free T4: 1 ng/dL (ref 0.8–1.8)

## 2020-09-14 LAB — BRAIN NATRIURETIC PEPTIDE: Brain Natriuretic Peptide: 39 pg/mL (ref <100)

## 2020-09-18 ENCOUNTER — Emergency Department
Admission: EM | Admit: 2020-09-18 | Discharge: 2020-09-18 | Disposition: A | Discharge status: Home / Self Care | Payer: Medicare PPO | Attending: Emergency Medicine | Admitting: Emergency Medicine

## 2020-09-18 ENCOUNTER — Other Ambulatory Visit: Payer: Self-pay

## 2020-09-18 DIAGNOSIS — R609 Edema, unspecified: Secondary | ICD-10-CM | POA: Diagnosis not present

## 2020-09-18 DIAGNOSIS — Z87891 Personal history of nicotine dependence: Secondary | ICD-10-CM | POA: Insufficient documentation

## 2020-09-18 DIAGNOSIS — G20 Parkinson's disease: Secondary | ICD-10-CM | POA: Diagnosis not present

## 2020-09-18 DIAGNOSIS — Z8616 Personal history of COVID-19: Secondary | ICD-10-CM | POA: Insufficient documentation

## 2020-09-18 DIAGNOSIS — E119 Type 2 diabetes mellitus without complications: Secondary | ICD-10-CM | POA: Insufficient documentation

## 2020-09-18 DIAGNOSIS — R2240 Localized swelling, mass and lump, unspecified lower limb: Secondary | ICD-10-CM | POA: Diagnosis present

## 2020-09-18 DIAGNOSIS — Z7984 Long term (current) use of oral hypoglycemic drugs: Secondary | ICD-10-CM | POA: Insufficient documentation

## 2020-09-18 LAB — URINALYSIS, COMPLETE (UACMP) WITH MICROSCOPIC
Bilirubin Urine: NEGATIVE
Glucose, UA: >=500 mg/dL — AB
Hgb urine dipstick: NEGATIVE
Ketones, ur: NEGATIVE mg/dL
Nitrite: POSITIVE — AB
Protein, ur: 30 mg/dL — AB
Specific Gravity, Urine: 1.016 (ref 1.005–1.030)
WBC, UA: >50 WBC/hpf — ABNORMAL HIGH (ref 0–5)
pH: 5 (ref 5.0–8.0)

## 2020-09-18 LAB — CBC WITH DIFFERENTIAL/PLATELET
Abs Immature Granulocytes: 0 10*3/uL (ref 0.00–0.07)
Basophils Absolute: 0 10*3/uL (ref 0.0–0.1)
Basophils Relative: 0 %
Eosinophils Absolute: 0.1 10*3/uL (ref 0.0–0.5)
Eosinophils Relative: 2 %
HCT: 30.5 % — ABNORMAL LOW (ref 36.0–46.0)
Hemoglobin: 9.5 g/dL — ABNORMAL LOW (ref 12.0–15.0)
Immature Granulocytes: 0 %
Lymphocytes Relative: 21 %
Lymphs Abs: 0.7 10*3/uL (ref 0.7–4.0)
MCH: 25.5 pg — ABNORMAL LOW (ref 26.0–34.0)
MCHC: 31.1 g/dL (ref 30.0–36.0)
MCV: 81.8 fL (ref 80.0–100.0)
Monocytes Absolute: 0.4 10*3/uL (ref 0.1–1.0)
Monocytes Relative: 12 %
Neutro Abs: 2.3 10*3/uL (ref 1.7–7.7)
Neutrophils Relative %: 65 %
Platelets: 131 10*3/uL — ABNORMAL LOW (ref 150–400)
RBC: 3.73 MIL/uL — ABNORMAL LOW (ref 3.87–5.11)
RDW: 15.4 % (ref 11.5–15.5)
WBC: 3.5 10*3/uL — ABNORMAL LOW (ref 4.0–10.5)
nRBC: 0 % (ref 0.0–0.2)

## 2020-09-18 LAB — COMPREHENSIVE METABOLIC PANEL
ALT: 25 U/L (ref 0–44)
AST: 35 U/L (ref 15–41)
Albumin: 3.5 g/dL (ref 3.5–5.0)
Alkaline Phosphatase: 112 U/L (ref 38–126)
Anion gap: 10 (ref 5–15)
BUN: 13 mg/dL (ref 8–23)
CO2: 25 mmol/L (ref 22–32)
Calcium: 8.8 mg/dL — ABNORMAL LOW (ref 8.9–10.3)
Chloride: 103 mmol/L (ref 98–111)
Creatinine, Ser: 0.61 mg/dL (ref 0.44–1.00)
GFR, Estimated: >60 mL/min (ref >60)
Glucose, Bld: 247 mg/dL — ABNORMAL HIGH (ref 70–99)
Potassium: 4.1 mmol/L (ref 3.5–5.1)
Sodium: 138 mmol/L (ref 135–145)
Total Bilirubin: 0.6 mg/dL (ref 0.3–1.2)
Total Protein: 6.6 g/dL (ref 6.5–8.1)

## 2020-09-18 LAB — LACTIC ACID, PLASMA: Lactic Acid, Venous: 1.7 mmol/L (ref 0.5–1.9)

## 2020-09-18 NOTE — ED Provider Notes (Signed)
[facility] Emergency Department Provider Note  ____________________________________________   I have reviewed the triage vital signs and the nursing notes.   HISTORY  Chief Complaint Leg Swelling and cellulitis, redness with blisters   History limited by: Not Limited   HPI Whitney [name] is a 79 y.o. female who presents to the emergency department today because of concern for leg swelling. The patient has noticed the swelling for weeks now. It started after she had a partial thyroidectomy.  The patient did see her provider about this 5 days ago.  She states since that time the swelling has gotten slightly worse.  She has noticed some blistering in her legs.  She has some discomfort but no sharp pain.  Patient denies any shortness of breath.  Denies any fevers.  She did try wearing compression stockings for 4-1/2 hours without any significant relief.  Patient denies any dysuria or bad odor to her urine.  Records reviewed. Per medical record review patient has a history of overactive bladder, recent PCP visit for peripheral edema.   Past Medical History:  Diagnosis Date  . Arthritis   . Broken shoulder    left humerus   . COVID-19   . Depression   . Diabetes mellitus without complication ([place])   . Goiter   . Hyperlipidemia   . Overactive bladder   . Parkinson's disease ([place])   . Pneumonia   . Unsteady gait     Patient Active Problem List   Diagnosis Date Noted  . 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 Impaired mobility and ADLs 04/15/2020  . Vitamin D deficiency 07/20/2019  . 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 At high risk for falls 07/20/2019  . Parkinson disease ([place]) 01/15/2019  . Hyperlipidemia 01/15/2019  . Diabetes mellitus type 2 in obese ([place]) 01/13/2019  . Overactive bladder 01/13/2019  . Obesity (BMI 30.0-34.9) 01/13/2019    Past Surgical History:  Procedure Laterality Date  . ABDOMINAL HYSTERECTOMY  1980   . BLADDER REPAIR  1980   post hysterectomy  . NECK SURGERY     anterior cervical disc fusion  . THYROIDECTOMY Left 08/03/2020   Procedure: Alfredia Ferguson;  Surgeon: Clyde Canterbury, MD;  Location: [facility] ORS;  Service: ENT;  Laterality: Left;    Prior to Admission medications   Medication Sig Start Date End Date Taking? Authorizing Provider  ACCU-CHEK GUIDE test strip  04/27/20   [provider]  Accu-Chek Softclix Lancets lancets  04/27/20   [provider]  acetaminophen (TYLENOL) 500 MG tablet Take 500-1,000 mg by mouth every 6 (six) hours as needed (for pain.).    [provider]  Alcohol Swabs (B-D SINGLE USE SWABS REGULAR) PADS  04/27/20   [provider]  atorvastatin (LIPITOR) 40 MG tablet Take 1 tablet (40 mg total) by mouth at bedtime. 07/26/20   Delsa Grana, PA-C  bacitracin ointment Apply 1 application topically every 8 (eight) hours. 08/04/20   Clyde Canterbury, MD  Blood Glucose Monitoring Suppl (ACCU-CHEK GUIDE ME) w/Device KIT  04/27/20   [provider]  buPROPion (WELLBUTRIN XL) 150 MG 24 hr tablet TAKE 1 TABLET(150 MG) BY MOUTH DAILY 04/01/20   Delsa Grana, PA-C  cholecalciferol (VITAMIN D3) 25 MCG (1000 UNIT) tablet Take 1,000 Units by mouth daily. Patient not taking: Reported on 09/13/2020    [provider]  metFORMIN (GLUCOPHAGE-XR) 500 MG 24 hr tablet Take 1 tablet (500 mg total) by mouth 2 (two) times daily with a  meal. TAKE 1 TABLET(500 MG) BY MOUTH BID 09/01/20   Delsa Grana, PA-C  mirabegron ER (MYRBETRIQ) 25 MG TB24 tablet Take 1 tablet (25 mg total) by mouth daily. 01/01/20   Festus Aloe, MD  rOPINIRole (REQUIP) 0.25 MG tablet Take 0.5 mg by mouth in the morning, at noon, and at bedtime.  03/28/20   [provider]  solifenacin (VESICARE) 5 MG tablet Take 1 tablet (5 mg total) by mouth daily. Patient taking differently: Take 5 mg by mouth daily after supper.  01/01/20   Festus Aloe, MD  vitamin B-12  (CYANOCOBALAMIN) 500 MCG tablet Take 500 mcg by mouth daily.    [provider]    Allergies Patient has no known allergies.  Family History  Problem Relation Age of Onset  . Stroke Mother   . Hypertension Mother   . Diabetes Father   . Heart disease Father   . Lung cancer Father   . Colon cancer Sister   . Diabetes Sister   . Kidney disease Daughter   . Kidney cancer Daughter   . Pulmonary embolism Sister   . Diabetes Sister   . Kidney disease Sister   . Heart disease Sister   . Atrial fibrillation Sister   . Diabetes Brother   . Heart disease Brother   . AAA (abdominal aortic aneurysm) Brother   . Cancer Brother        skin    Social History Social History   Tobacco Use  . Smoking status: Former Smoker    Packs/day: 2.00    Years: 40.00    Pack years: 80.00    Types: Cigarettes    Quit date: 12/10/2018    Years since quitting: 1.7  . Smokeless tobacco: Never Used  Vaping Use  . Vaping Use: Never used  Substance Use Topics  . Alcohol use: Never  . Drug use: Never    Review of Systems Constitutional: No fever/chills Eyes: No visual changes. ENT: No sore throat. Cardiovascular: Denies chest pain. Respiratory: Denies shortness of breath. Gastrointestinal: No abdominal pain.  No nausea, no vomiting.  No diarrhea.   Genitourinary: Negative for dysuria. Musculoskeletal: Positive for bilateral lower extremity edema.  Skin: Positive for blisters to lower legs.  Neurological: Negative for headaches, focal weakness or numbness.  ____________________________________________   PHYSICAL EXAM:  VITAL SIGNS: ED Triage Vitals  Enc Vitals Group     BP 09/18/20 1254 134/80     Pulse Rate 09/18/20 1254 95     Resp 09/18/20 1254 18     Temp 09/18/20 1254 98.5 F (36.9 C)     Temp Source 09/18/20 1254 Oral     SpO2 09/18/20 1254 100 %     Weight --      Height 09/18/20 1256 '5\' 2"'  (1.575 m)     Head Circumference --      Peak Flow --      Pain Score  09/18/20 1255 8   Constitutional: Alert and oriented.  Eyes: Conjunctivae are normal.  ENT      Head: Normocephalic and atraumatic.      Nose: No congestion/rhinnorhea.      Mouth/Throat: Mucous membranes are moist.      Neck: No stridor. Hematological/Lymphatic/Immunilogical: No cervical lymphadenopathy. Cardiovascular: Normal rate, regular rhythm.  No murmurs, rubs, or gallops.  Respiratory: Normal respiratory effort without tachypnea nor retractions. Breath sounds are clear and equal bilaterally. No wheezes/rales/rhonchi. Gastrointestinal: Soft and non tender. No rebound. No guarding.  Genitourinary: Deferred Musculoskeletal:  Normal range of motion in all extremities. Bilateral lower extremity edema. No erythema. Some blistering. No warmth.  Neurologic:  Normal speech and language. No gross focal neurologic deficits are appreciated.  Skin:  Skin is warm, dry and intact. No rash noted. Psychiatric: Mood and affect are normal. Speech and behavior are normal. Patient exhibits appropriate insight and judgment.  ____________________________________________    LABS (pertinent positives/negatives)  Lactic acid 1.7 CMP wnl except glu 247, ca 8.8 UA turbid, positive nitrite, 11-20 rbc, >50 wbc, few bacteria, squamous 21-50 CBC wbc 3.5, hgb 9.5, plt 131  ____________________________________________   EKG  None  ____________________________________________    RADIOLOGY  None  ____________________________________________   PROCEDURES  Procedures  ____________________________________________   INITIAL IMPRESSION / ASSESSMENT AND PLAN / ED COURSE  Pertinent labs & imaging results that were available during my care of the patient were reviewed by me and considered in my medical decision making (see chart for details).   Patient presented to the emergency department today because of concerns for continued bilateral lower extremity edema.  Symptoms have been going on for  weeks.  Patient saw PCP 5 days ago.  Sounds like she only used compression for 4-1/2 hours.  This time I have low suspicion for cellulitis.  I discussed this with the patient.  Do think compression would aid the patient's symptoms.  Urine was sent in did have some findings concerning for possible urinary tract infection however was not a clean sample.  Patient denies any urinary tract infection symptoms at this time.  Will plan on sending for culture.  Discussed this with the patient.  Discussed return precautions.   ____________________________________________   FINAL CLINICAL IMPRESSION(S) / ED DIAGNOSES  Final diagnoses:  Peripheral edema     Note: This dictation was prepared with Dragon dictation. Any transcriptional errors that result from this process are unintentional     Nance Pear, MD 09/18/20 [phone]

## 2020-09-18 NOTE — ED Triage Notes (Signed)
Pt arrives via pov from home. Pt reports bilateral lower leg swelling with areas that appear to be blisters that started 3 weeks ago. Redness started 2 weeks ago. Pt reports she does not take diuretics and normally does not have any selling. Bilateral pitting edema noted up to knee  Denies any fevers, NAD noted at this time.

## 2020-09-18 NOTE — Discharge Instructions (Addendum)
As we discussed it would be a good idea for you to use compression on your legs and be sure to elevate them if you are lying or sitting down. Please seek medical attention for any high fevers, chest pain, shortness of breath, change in behavior, persistent vomiting, bloody stool or any other new or concerning symptoms.

## 2020-09-20 LAB — URINE CULTURE: Culture: >=100000 — AB

## 2020-10-11 ENCOUNTER — Encounter: Payer: Self-pay | Admitting: Oncology

## 2020-10-11 ENCOUNTER — Inpatient Hospital Stay: Payer: Medicare PPO

## 2020-10-11 ENCOUNTER — Inpatient Hospital Stay: Payer: Medicare PPO | Attending: Oncology | Admitting: Oncology

## 2020-10-11 ENCOUNTER — Other Ambulatory Visit: Payer: Self-pay

## 2020-10-11 VITALS — BP 107/60 | HR 90 | Temp 97.4°F | Resp 18 | Ht 62.0 in | Wt 170.2 lb

## 2020-10-11 DIAGNOSIS — R238 Other skin changes: Secondary | ICD-10-CM

## 2020-10-11 DIAGNOSIS — Z801 Family history of malignant neoplasm of trachea, bronchus and lung: Secondary | ICD-10-CM | POA: Insufficient documentation

## 2020-10-11 DIAGNOSIS — Z8616 Personal history of COVID-19: Secondary | ICD-10-CM | POA: Diagnosis not present

## 2020-10-11 DIAGNOSIS — M129 Arthropathy, unspecified: Secondary | ICD-10-CM | POA: Insufficient documentation

## 2020-10-11 DIAGNOSIS — Z87891 Personal history of nicotine dependence: Secondary | ICD-10-CM | POA: Insufficient documentation

## 2020-10-11 DIAGNOSIS — R0602 Shortness of breath: Secondary | ICD-10-CM | POA: Diagnosis not present

## 2020-10-11 DIAGNOSIS — E119 Type 2 diabetes mellitus without complications: Secondary | ICD-10-CM | POA: Insufficient documentation

## 2020-10-11 DIAGNOSIS — D509 Iron deficiency anemia, unspecified: Secondary | ICD-10-CM | POA: Diagnosis not present

## 2020-10-11 DIAGNOSIS — D696 Thrombocytopenia, unspecified: Secondary | ICD-10-CM | POA: Insufficient documentation

## 2020-10-11 DIAGNOSIS — R932 Abnormal findings on diagnostic imaging of liver and biliary tract: Secondary | ICD-10-CM | POA: Diagnosis not present

## 2020-10-11 DIAGNOSIS — E875 Hyperkalemia: Secondary | ICD-10-CM | POA: Diagnosis not present

## 2020-10-11 DIAGNOSIS — R5383 Other fatigue: Secondary | ICD-10-CM | POA: Insufficient documentation

## 2020-10-11 DIAGNOSIS — Z79899 Other long term (current) drug therapy: Secondary | ICD-10-CM | POA: Diagnosis not present

## 2020-10-11 DIAGNOSIS — R233 Spontaneous ecchymoses: Secondary | ICD-10-CM

## 2020-10-11 DIAGNOSIS — E611 Iron deficiency: Secondary | ICD-10-CM

## 2020-10-11 DIAGNOSIS — R161 Splenomegaly, not elsewhere classified: Secondary | ICD-10-CM | POA: Diagnosis not present

## 2020-10-11 DIAGNOSIS — F329 Major depressive disorder, single episode, unspecified: Secondary | ICD-10-CM | POA: Insufficient documentation

## 2020-10-11 LAB — IRON AND TIBC
Iron: 103 ug/dL (ref 28–170)
Saturation Ratios: 25 % (ref 10.4–31.8)
TIBC: 406 ug/dL (ref 250–450)
UIBC: 303 ug/dL

## 2020-10-11 LAB — CBC WITH DIFFERENTIAL/PLATELET
Abs Immature Granulocytes: 0.01 10*3/uL (ref 0.00–0.07)
Basophils Absolute: 0 10*3/uL (ref 0.0–0.1)
Basophils Relative: 1 %
Eosinophils Absolute: 0.1 10*3/uL (ref 0.0–0.5)
Eosinophils Relative: 2 %
HCT: 33.1 % — ABNORMAL LOW (ref 36.0–46.0)
Hemoglobin: 10.5 g/dL — ABNORMAL LOW (ref 12.0–15.0)
Immature Granulocytes: 0 %
Lymphocytes Relative: 22 %
Lymphs Abs: 0.7 10*3/uL (ref 0.7–4.0)
MCH: 25.7 pg — ABNORMAL LOW (ref 26.0–34.0)
MCHC: 31.7 g/dL (ref 30.0–36.0)
MCV: 81.1 fL (ref 80.0–100.0)
Monocytes Absolute: 0.4 10*3/uL (ref 0.1–1.0)
Monocytes Relative: 13 %
Neutro Abs: 2 10*3/uL (ref 1.7–7.7)
Neutrophils Relative %: 62 %
Platelets: 142 10*3/uL — ABNORMAL LOW (ref 150–400)
RBC: 4.08 MIL/uL (ref 3.87–5.11)
RDW: 17.9 % — ABNORMAL HIGH (ref 11.5–15.5)
WBC: 3.2 10*3/uL — ABNORMAL LOW (ref 4.0–10.5)
nRBC: 0 % (ref 0.0–0.2)

## 2020-10-11 LAB — RETIC PANEL
Immature Retic Fract: 14.3 % (ref 2.3–15.9)
RBC.: 4.13 MIL/uL (ref 3.87–5.11)
Retic Count, Absolute: 61.5 10*3/uL (ref 19.0–186.0)
Retic Ct Pct: 1.5 % (ref 0.4–3.1)
Reticulocyte Hemoglobin: 27.9 pg — ABNORMAL LOW (ref >27.9)

## 2020-10-11 LAB — FERRITIN: Ferritin: 12 ng/mL (ref 11–307)

## 2020-10-11 LAB — LACTATE DEHYDROGENASE: LDH: 143 U/L (ref 98–192)

## 2020-10-11 LAB — HEPATITIS PANEL, ACUTE
HCV Ab: NONREACTIVE
Hep A IgM: NONREACTIVE
Hep B C IgM: NONREACTIVE
Hepatitis B Surface Ag: NONREACTIVE

## 2020-10-11 LAB — TECHNOLOGIST SMEAR REVIEW: Plt Morphology: NORMAL

## 2020-10-11 LAB — FOLATE: Folate: 8.4 ng/mL (ref >5.9)

## 2020-10-11 LAB — VITAMIN B12: Vitamin B-12: 1204 pg/mL — ABNORMAL HIGH (ref 180–914)

## 2020-10-11 LAB — APTT: aPTT: 32 seconds (ref 24–36)

## 2020-10-11 LAB — PROTIME-INR
INR: 1.2 (ref 0.8–1.2)
Prothrombin Time: 14.3 seconds (ref 11.4–15.2)

## 2020-10-11 LAB — TSH: TSH: 2.981 u[IU]/mL (ref 0.350–4.500)

## 2020-10-11 NOTE — Progress Notes (Signed)
Patient here to establish care for low iron. Pt reports that she had thyroid cancer but thyroid removed in August.

## 2020-10-11 NOTE — Progress Notes (Signed)
Hematology/Oncology Consult note [facility] Telephone:([phone] [phone]   Patient Care Team: Delsa Grana, PA-C as PCP - General (Family Medicine) Vladimir Crofts, MD as Consulting Physician (Neurology) Clyde Canterbury, MD as Referring Physician (Otolaryngology) Festus Aloe, MD as Consulting Physician (Urology)  REFERRING PROVIDER: Benay Spice, PA-C CHIEF COMPLAINTS/REASON FOR VISIT:  Evaluation of iron deficiency anemia  HISTORY OF PRESENTING ILLNESS:  Whitney [name] is a  79 y.o.  female with PMH listed below was seen in consultation at the request of Sunday Corn, Autumn Kari, PA-C   for evaluation of iron deficiency anemia.   Reviewed patient's recent labs  10/5/2021Labs revealed anemia with hemoglobin of 9.9 Iron panel ferritin 8, iron saturation 6, TIBC 4 7. Reviewed patient's previous labs ordered by other physician, anemia is new onset since October 2021. No aggravating or improving factors.  Associated signs and symptoms: Patient reports fatigue.  SOB with exertion.  Denies weight loss, easy bruising, hematochezia, hemoptysis, hematuria. Context:  History of iron deficiency: Denies any previous history of iron deficiency.  She has been started on ferrous sulfate 325 mg daily.  She reports tolerating iron treatments very well. Rectal bleeding: She denies any bleeding she recalls that her stool has been dark even before iron supplementation Menstrual bleeding/ Vaginal bleeding : Denies Hematemesis or hemoptysis : denies Blood in urine : denies   Family history positive for sister Izora Gala with colon cancer, another sister with kidney cancer and a daughter with kidney cancer.    06/29/2020, CT chest lung cancer screening showed lung RADS 2 Irregular liver contour consistent with cirrhosis.  Splenomegaly Markedly enlarged and a low-attenuation left thyroid.  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  Post surgery TSH was normal at 3 on 09/01/2020. Patient has increased bilateral lower extremity swelling and has been to emergency room for concern of myxedema.  TSH and T4 have been normal.  Review of Systems  Constitutional: Positive for fatigue. Negative for appetite change, chills and fever.  HENT:   Negative for hearing loss and voice change.   Eyes: Negative for eye problems.  Respiratory: Negative for chest tightness and cough.   Cardiovascular: Positive for leg swelling. Negative for chest pain.  Gastrointestinal: Negative for abdominal distention, abdominal pain and blood in stool.  Endocrine: Negative for hot flashes.  Genitourinary: Negative for difficulty urinating and frequency.   Musculoskeletal: Negative for arthralgias.  Skin: Negative for itching and rash.  Neurological: Negative for extremity weakness.  Hematological: Negative for adenopathy. Bruises/bleeds easily.  Psychiatric/Behavioral: Negative for confusion.    MEDICAL HISTORY:  Past Medical History:  Diagnosis Date  . Arthritis   . Broken shoulder    left humerus   . COVID-19   . Depression   . Diabetes mellitus without complication ([place])   . Goiter   . Hyperlipidemia   . Overactive bladder   . Parkinson's disease ([place])   . Pneumonia   . Unsteady gait     SURGICAL HISTORY: Past Surgical History:  Procedure Laterality Date  . ABDOMINAL HYSTERECTOMY  1980  . BLADDER REPAIR  1980   post hysterectomy  . NECK SURGERY     anterior cervical disc fusion  . THYROIDECTOMY Left 08/03/2020   Procedure: Alfredia Ferguson;  Surgeon: Clyde Canterbury, MD;  Location: [facility] ORS;  Service: ENT;  Laterality: Left;    SOCIAL HISTORY: Social History   Socioeconomic History  .  Marital status: Widowed    Spouse name: Jeneen Rinks  . Number of children: 2  . Years of education: 65  .  Highest education level: Some college, no degree  Occupational History  . Occupation: retired Conservation officer, historic buildings  Tobacco Use  . Smoking status: Former Smoker    Packs/day: 2.00    Years: 40.00    Pack years: 80.00    Types: Cigarettes    Quit date: 12/10/2018    Years since quitting: 1.8  . Smokeless tobacco: Never Used  Vaping Use  . Vaping Use: Never used  Substance and Sexual Activity  . Alcohol use: Not Currently  . Drug use: Never  . Sexual activity: Not Currently  Other Topics Concern  . Not on file  Social History Narrative   Lost her husband of 36 years in oct 2019. Moved back from [place] in Nov 2019   Social Determinants of Health   Financial Resource Strain: Low Risk   . Difficulty of Paying Living Expenses: Not hard at all  Food Insecurity: No Food Insecurity  . Worried About Charity fundraiser in the Last Year: Never true  . Ran Out of Food in the Last Year: Never true  Transportation Needs: No Transportation Needs  . Lack of Transportation (Medical): No  . Lack of Transportation (Non-Medical): No  Physical Activity: Inactive  . Days of Exercise per Week: 0 days  . Minutes of Exercise per Session: 0 min  Stress: No Stress Concern Present  . Feeling of Stress : Not at all  Social Connections: Socially Isolated  . Frequency of Communication with Friends and Family: More than three times a week  . Frequency of Social Gatherings with Friends and Family: More than three times a week  . Attends Religious Services: Never  . Active Member of Clubs or Organizations: No  . Attends Archivist Meetings: Never  . Marital Status: Widowed  Intimate Partner Violence: Not At Risk  . Fear of Current or Ex-Partner: No  . Emotionally Abused: No  . Physically Abused: No  . Sexually Abused: No    FAMILY HISTORY: Family History  Problem Relation Age of Onset  . Stroke Mother   . Hypertension Mother   . Diabetes Father   . Heart disease Father   . Lung cancer  Father   . Colon cancer Sister   . Diabetes Sister   . Kidney disease Daughter   . Kidney cancer Daughter   . Pulmonary embolism Sister   . Diabetes Sister   . Kidney disease Sister   . Heart disease Sister   . Atrial fibrillation Sister   . Diabetes Brother   . Heart disease Brother   . AAA (abdominal aortic aneurysm) Brother   . Cancer Brother        skin    ALLERGIES:  has No Known Allergies.  MEDICATIONS:  Current Outpatient Medications  Medication Sig Dispense Refill  . ACCU-CHEK GUIDE test strip     . Accu-Chek Softclix Lancets lancets     . acetaminophen (TYLENOL) 500 MG tablet Take 500-1,000 mg by mouth every 6 (six) hours as needed (for pain.).    Marland Kitchen Alcohol Swabs (B-D SINGLE USE SWABS REGULAR) PADS     . atorvastatin (LIPITOR) 40 MG tablet Take 1 tablet (40 mg total) by mouth at bedtime. 90 tablet 3  . bacitracin ointment Apply 1 application topically every 8 (eight) hours. 120 g 0  . Blood Glucose Monitoring Suppl (ACCU-CHEK GUIDE  ME) w/Device KIT     . buPROPion (WELLBUTRIN XL) 150 MG 24 hr tablet TAKE 1 TABLET(150 MG) BY MOUTH DAILY 90 tablet 1  . carbidopa-levodopa (SINEMET IR) 25-100 MG tablet Take by mouth. Take 1 tablet by mouth 4 (four) times daily    . cholecalciferol (VITAMIN D3) 25 MCG (1000 UNIT) tablet Take 1,000 Units by mouth daily.     . ferrous sulfate 325 (65 FE) MG tablet Take 325 mg by mouth daily with breakfast.     . metFORMIN (GLUCOPHAGE-XR) 500 MG 24 hr tablet Take 1 tablet (500 mg total) by mouth 2 (two) times daily with a meal. TAKE 1 TABLET(500 MG) BY MOUTH BID (Patient taking differently: Take 500 mg by mouth in the morning, at noon, and at bedtime. TAKE 1 TABLET(500 MG) BY MOUTH BID) 180 tablet 3  . mirabegron ER (MYRBETRIQ) 25 MG TB24 tablet Take 1 tablet (25 mg total) by mouth daily. 30 tablet 11  . rOPINIRole (REQUIP) 0.25 MG tablet Take 0.5 mg by mouth 3 (three) times daily.     . solifenacin (VESICARE) 5 MG tablet Take 1 tablet (5 mg  total) by mouth daily. (Patient taking differently: Take 5 mg by mouth daily after supper. ) 30 tablet 11  . vitamin B-12 (CYANOCOBALAMIN) 500 MCG tablet Take 500 mcg by mouth daily.     No current facility-administered medications for this visit.     PHYSICAL EXAMINATION: ECOG PERFORMANCE STATUS: 2 - Symptomatic, <50% confined to bed Vitals:   10/11/20 1115  BP: 107/60  Pulse: 90  Resp: 18  Temp: (!) 97.4 F (36.3 C)   Filed Weights   10/11/20 1115  Weight: 170 lb 3.2 oz (77.2 kg)    Physical Exam Constitutional:      General: She is not in acute distress.    Appearance: She is obese.     Comments: Patient sits in the wheelchair  HENT:     Head: Normocephalic and atraumatic.  Eyes:     General: No scleral icterus. Cardiovascular:     Rate and Rhythm: Normal rate and regular rhythm.     Heart sounds: Normal heart sounds.  Pulmonary:     Effort: Pulmonary effort is normal. No respiratory distress.     Breath sounds: No wheezing.  Abdominal:     General: Bowel sounds are normal. There is no distension.     Palpations: Abdomen is soft.  Musculoskeletal:        General: No deformity. Normal range of motion.     Cervical back: Normal range of motion and neck supple.  Skin:    General: Skin is warm and dry.     Findings: No erythema or rash.  Neurological:     Mental Status: She is alert. Mental status is at baseline.     Cranial Nerves: No cranial nerve deficit.     Coordination: Coordination normal.  Psychiatric:        Mood and Affect: Mood normal.       CMP Latest Ref Rng & Units 09/18/2020  Glucose 70 - 99 mg/dL 247(H)  BUN 8 - 23 mg/dL 13  Creatinine 0.44 - 1.00 mg/dL 0.61  Sodium 135 - 145 mmol/L 138  Potassium 3.5 - 5.1 mmol/L 4.1  Chloride 98 - 111 mmol/L 103  CO2 22 - 32 mmol/L 25  Calcium 8.9 - 10.3 mg/dL 8.8(L)  Total Protein 6.5 - 8.1 g/dL 6.6  Total Bilirubin 0.3 - 1.2 mg/dL 0.6  Alkaline Phos  38 - 126 U/L 112  AST 15 - 41 U/L 35  ALT 0 -  44 U/L 25   CBC Latest Ref Rng & Units 10/11/2020  WBC 4.0 - 10.5 K/uL 3.2(L)  Hemoglobin 12.0 - 15.0 g/dL 10.5(L)  Hematocrit 36 - 46 % 33.1(L)  Platelets 150 - 400 K/uL 142(L)     LABORATORY DATA:  I have reviewed the data as listed Lab Results  Component Value Date   WBC 3.2 (L) 10/11/2020   HGB 10.5 (L) 10/11/2020   HCT 33.1 (L) 10/11/2020   MCV 81.1 10/11/2020   PLT 142 (L) 10/11/2020   Recent Labs    11/17/19 2340 11/17/19 2340 01/05/20 0915 01/05/20 0915 08/01/20 1307 09/13/20 1023 09/18/20 1259  NA 134*   < > 139   < > 140 140 138  K 4.6   < > 4.1   < > 3.9 4.2 4.1  CL 100   < > 102   < > 102 106 103  CO2 23   < > 27   < > '26 26 25  ' GLUCOSE 204*   < > 204*   < > 221* 129* 247*  BUN 13   < > 13   < > '14 14 13  ' CREATININE 0.51   < > 0.67  --  0.58 0.58* 0.61  CALCIUM 8.7*   < > 9.2   < > 8.7* 9.1 8.8*  GFRNONAA >60   < > 84  --  >60 88 >60  GFRAA >60  --  98  --  >60 102  --   PROT 7.3   < > 6.3  --   --  6.3 6.6  ALBUMIN 4.0  --   --   --   --   --  3.5  AST 36   < > 40*  --   --  27 35  ALT 22   < > 27  --   --  21 25  ALKPHOS 118  --   --   --   --   --  112  BILITOT 1.5*   < > 0.8  --   --  0.6 0.6   < > = values in this interval not displayed.   Iron/TIBC/Ferritin/ %Sat    Component Value Date/Time   IRON 103 10/11/2020 1159   TIBC 406 10/11/2020 1159   FERRITIN 12 10/11/2020 1159   IRONPCTSAT 25 10/11/2020 1159   IRONPCTSAT 6 (L) 09/13/2020 1023     RADIOGRAPHIC STUDIES: I have personally reviewed the radiological images as listed and agreed with the findings in the report. No results found.     ASSESSMENT & PLAN:  1. Iron deficiency anemia, unspecified iron deficiency anemia type   2. Easy bruising   3. Abnormal CT of liver   4. Thrombocytopenia ([place])   5. Splenomegaly    #Labs are reviewed and discussed with patient. Consistent with iron deficiency anemia. I discussed about option of continue oral iron supplementation and increased  to ferrous sulfate 325 mg twice daily as well as option of IV Venofer treatments.  Allergy reactions/infusion reaction including anaphylactic reaction discussed with patient. Other side effects include but not limited to high blood pressure, skin rash, weight gain, leg swelling, etc Patient prefers oral iron supplementation.  I will check CBC, iron, TIBC, reticulocyte panel and if hemoglobin has improved we will continue oral iron supplementation. Discussed about GI evaluaiton of IDA and patient prefers to defer for  now until all her work up is back. Will further discuss at next visit.   Irregular liver contour and splenomegaly on previous CT scan.  Patient denies any history of hepatitis, chronic alcohol use. I will check ultrasound abdomen Check hepatitis panel #Thrombocytopenia, B12, folate level. Maybe from splenomegaly. Pending additional work up; #Easy bruising, I will check a PT and PTT Orders Placed This Encounter  Procedures  . CBC with Differential/Platelet    Standing Status:   Future    Number of Occurrences:   1    Standing Expiration Date:   10/11/2021  . Ferritin    Standing Status:   Future    Number of Occurrences:   1    Standing Expiration Date:   10/11/2021  . Folate    Standing Status:   Future    Number of Occurrences:   1    Standing Expiration Date:   10/11/2021  . Iron and TIBC    Standing Status:   Future    Number of Occurrences:   1    Standing Expiration Date:   10/11/2021  . Lactate dehydrogenase    Standing Status:   Future    Number of Occurrences:   1    Standing Expiration Date:   10/11/2021  . Retic Panel    Standing Status:   Future    Number of Occurrences:   1    Standing Expiration Date:   10/11/2021  . TSH    Standing Status:   Future    Number of Occurrences:   1    Standing Expiration Date:   10/11/2021  . Vitamin B12    Standing Status:   Future    Number of Occurrences:   1    Standing Expiration Date:   10/11/2021  . Technologist smear  review    Standing Status:   Future    Number of Occurrences:   1    Standing Expiration Date:   10/11/2021  . APTT    Standing Status:   Future    Number of Occurrences:   1    Standing Expiration Date:   10/11/2021  . Protime-INR    Standing Status:   Future    Number of Occurrences:   1    Standing Expiration Date:   10/11/2021  . Hepatitis panel, acute    Standing Status:   Future    Number of Occurrences:   1    Standing Expiration Date:   10/11/2021    All questions were answered. The patient knows to call the clinic with any problems questions or concerns.  Cc Benay Spice, PA-C  Return of visit: 10 weeks  thank you for this kind referral and the opportunity to participate in the care of this patient. A copy of today's note is routed to referring provider   Earlie Server, MD, PhD Hematology Oncology [place] at [facility] 10/11/2020

## 2020-10-12 ENCOUNTER — Telehealth: Payer: Self-pay

## 2020-10-12 DIAGNOSIS — R932 Abnormal findings on diagnostic imaging of liver and biliary tract: Secondary | ICD-10-CM

## 2020-10-12 DIAGNOSIS — R161 Splenomegaly, not elsewhere classified: Secondary | ICD-10-CM

## 2020-10-12 NOTE — Telephone Encounter (Signed)
-----   Message from Rickard Patience, MD sent at 10/11/2020 10:42 PM EDT ----- Please let patient and her sister know that her iron store and hemoglobin showed some improvement, so if she prefers to oral iron than IV iron , it is feasible. Recommend her to take ferrous sulfate 325mg  BID.  Please obtain abd- reason is abnormal liver on CT, splenomegaly.  Arrange follow up in 10 weeks, labs prior. MD

## 2020-10-12 NOTE — Telephone Encounter (Signed)
Left message for patient to call.  Please schedule abdominal US-first available and lab (2 days piror)/MD in 10 weeks.  I will inform patient of appt details when I discuss results.

## 2020-10-12 NOTE — Telephone Encounter (Signed)
Patient notified of results.  Please call patient with appt details and she prefers morning due to transportation.

## 2020-10-13 NOTE — Telephone Encounter (Signed)
Done  All appts has been scheduled as requested. Pt was made aware of the appt location,dates and times

## 2020-10-14 ENCOUNTER — Other Ambulatory Visit: Payer: Self-pay

## 2020-10-14 ENCOUNTER — Encounter: Payer: Self-pay | Admitting: Family Medicine

## 2020-10-14 ENCOUNTER — Ambulatory Visit (INDEPENDENT_AMBULATORY_CARE_PROVIDER_SITE_OTHER): Payer: Medicare PPO | Admitting: Family Medicine

## 2020-10-14 VITALS — BP 128/82 | HR 95 | Temp 97.6°F | Resp 16 | Ht 62.0 in | Wt 169.0 lb

## 2020-10-14 DIAGNOSIS — E669 Obesity, unspecified: Secondary | ICD-10-CM

## 2020-10-14 DIAGNOSIS — E1169 Type 2 diabetes mellitus with other specified complication: Secondary | ICD-10-CM | POA: Diagnosis not present

## 2020-10-14 DIAGNOSIS — R600 Localized edema: Secondary | ICD-10-CM

## 2020-10-14 DIAGNOSIS — Z23 Encounter for immunization: Secondary | ICD-10-CM

## 2020-10-14 NOTE — Progress Notes (Signed)
Patient ID: Whitney [name], female    DOB: 11-24-1941, 79 y.o.   MRN: [id number]  PCP: Delsa Grana, PA-C  Chief Complaint  Patient presents with  . Leg Swelling  . Diabetes    Subjective:   Whitney [name] is a 79 y.o. female, presents to clinic with CC of the following:  HPI  F/up on swelling and hyperglycemia   LE edema after surgery After last visit she did go to the ER again for slightly worse LE edema and some skin rxn  Last weights and TSH - weight stable, TSH rechecked again a few days ago and continues to be in normal range Wt Readings from Last 5 Encounters:  10/14/20 169 lb (76.7 kg)  10/11/20 170 lb 3.2 oz (77.2 kg)  09/13/20 168 lb 12.8 oz (76.6 kg)  09/01/20 167 lb 12.8 oz (76.1 kg)  08/01/20 152 lb (68.9 kg)   BMI Readings from Last 5 Encounters:  10/14/20 30.91 kg/m  10/11/20 31.13 kg/m  09/18/20 30.87 kg/m  09/13/20 30.87 kg/m  09/01/20 30.69 kg/m   Lab Results  Component Value Date   TSH 2.981 10/11/2020   DM - on metformin, sugars higher over the past month Lab Results  Component Value Date   HGBA1C 7.2 (H) 08/04/2020  Pt compliant with meds but eating is uncontrolled, a lot of sweets - was supposed to work on this and f/up She reports blood sugars have been 130's more recently, but after last visit she did still have some 200-300.  She has worked on cutting back sweets and she increased metformin to 500 mg TID (on her own)   Swelling in legs is still present but going down, pedal edema is almost gone It seems to improve when her blood sugars are better Still some rash to lower legs     Patient Active Problem List   Diagnosis Date Noted  . 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 Impaired mobility and ADLs 04/15/2020  . Vitamin D deficiency 07/20/2019  . 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 At high risk for falls 07/20/2019  . Parkinson disease ([place]) 01/15/2019  . Hyperlipidemia 01/15/2019  . Diabetes  mellitus type 2 in obese ([place]) 01/13/2019  . Overactive bladder 01/13/2019  . Obesity (BMI 30.0-34.9) 01/13/2019      Current Outpatient Medications:  .  ACCU-CHEK GUIDE test strip, , Disp: , Rfl:  .  Accu-Chek Softclix Lancets lancets, , Disp: , Rfl:  .  acetaminophen (TYLENOL) 500 MG tablet, Take 500-1,000 mg by mouth every 6 (six) hours as needed (for pain.)., Disp: , Rfl:  .  Alcohol Swabs (B-D SINGLE USE SWABS REGULAR) PADS, , Disp: , Rfl:  .  atorvastatin (LIPITOR) 40 MG tablet, Take 1 tablet (40 mg total) by mouth at bedtime., Disp: 90 tablet, Rfl: 3 .  bacitracin ointment, Apply 1 application topically every 8 (eight) hours., Disp: 120 g, Rfl: 0 .  Blood Glucose Monitoring Suppl (ACCU-CHEK GUIDE ME) w/Device KIT, , Disp: , Rfl:  .  buPROPion (WELLBUTRIN XL) 150 MG 24 hr tablet, TAKE 1 TABLET(150 MG) BY MOUTH DAILY, Disp: 90 tablet, Rfl: 1 .  carbidopa-levodopa (SINEMET IR) 25-100 MG tablet, Take by mouth. Take 1 tablet by mouth 4 (four) times daily, Disp: , Rfl:  .  cholecalciferol (VITAMIN D3) 25 MCG (1000 UNIT) tablet, Take 1,000 Units by mouth daily. , Disp: , Rfl:  .  ferrous sulfate 325 (65 FE) MG tablet, Take 325 mg by mouth  daily with breakfast. , Disp: , Rfl:  .  metFORMIN (GLUCOPHAGE-XR) 500 MG 24 hr tablet, Take 1 tablet (500 mg total) by mouth 2 (two) times daily with a meal. TAKE 1 TABLET(500 MG) BY MOUTH BID (Patient taking differently: Take 500 mg by mouth in the morning, at noon, and at bedtime. TAKE 1 TABLET(500 MG) BY MOUTH BID), Disp: 180 tablet, Rfl: 3 .  mirabegron ER (MYRBETRIQ) 25 MG TB24 tablet, Take 1 tablet (25 mg total) by mouth daily., Disp: 30 tablet, Rfl: 11 .  rOPINIRole (REQUIP) 0.25 MG tablet, Take 0.5 mg by mouth 3 (three) times daily. , Disp: , Rfl:  .  solifenacin (VESICARE) 5 MG tablet, Take 1 tablet (5 mg total) by mouth daily. (Patient taking differently: Take 5 mg by mouth daily after supper. ), Disp: 30 tablet, Rfl: 11 .  vitamin B-12  (CYANOCOBALAMIN) 500 MCG tablet, Take 500 mcg by mouth daily., Disp: , Rfl:    No Known Allergies   Social History   Tobacco Use  . Smoking status: Former Smoker    Packs/day: 2.00    Years: 40.00    Pack years: 80.00    Types: Cigarettes    Quit date: 12/10/2018    Years since quitting: 1.8  . Smokeless tobacco: Never Used  Vaping Use  . Vaping Use: Never used  Substance Use Topics  . Alcohol use: Not Currently  . Drug use: Never      Chart Review Today: I personally reviewed active problem list, medication list, allergies, family history, social history, health maintenance, notes from last encounter, lab results, imaging with the patient/caregiver today.   Review of Systems 10 Systems reviewed and are negative for acute change except as noted in the HPI.     Objective:   Vitals:   10/14/20 1122  BP: 128/82  Pulse: 95  Resp: 16  Temp: 97.6 F (36.4 C)  TempSrc: Oral  SpO2: 99%  Weight: 169 lb (76.7 kg)  Height: '5\' 2"'  (1.575 m)    Body mass index is 30.91 kg/m.  Physical Exam Vitals and nursing note reviewed.  Constitutional:      General: She is not in acute distress.    Appearance: She is obese. She is not ill-appearing, toxic-appearing or diaphoretic.  HENT:     Head: Normocephalic and atraumatic.     Right Ear: External ear normal.     Left Ear: External ear normal.  Eyes:     General: No scleral icterus.       Right eye: No discharge.        Left eye: No discharge.     Conjunctiva/sclera: Conjunctivae normal.  Cardiovascular:     Rate and Rhythm: Normal rate and regular rhythm.     Pulses: Normal pulses.     Heart sounds: Normal heart sounds. No murmur heard.  No friction rub. No gallop.      Comments: 1+ pretibial edema b/l  Pedal edema resolved Pulmonary:     Effort: Pulmonary effort is normal. No respiratory distress.     Breath sounds: Normal breath sounds. No wheezing, rhonchi or rales.  Abdominal:     General: Bowel sounds are  normal.     Palpations: Abdomen is soft.  Skin:    Findings: Rash present.  Neurological:     Mental Status: She is alert. Mental status is at baseline.  Psychiatric:        Mood and Affect: Mood normal.      Results  for orders placed or performed in visit on 10/11/20  Hepatitis panel, acute  Result Value Ref Range   Hepatitis B Surface Ag NON REACTIVE NON REACTIVE   HCV Ab NON REACTIVE NON REACTIVE   Hep A IgM NON REACTIVE NON REACTIVE   Hep B C IgM NON REACTIVE NON REACTIVE  Protime-INR  Result Value Ref Range   Prothrombin Time 14.3 11.4 - 15.2 seconds   INR 1.2 0.8 - 1.2  APTT  Result Value Ref Range   aPTT 32 24 - 36 seconds  Vitamin B12  Result Value Ref Range   Vitamin B-12 1,204 (H) 180 - 914 pg/mL  TSH  Result Value Ref Range   TSH 2.981 0.350 - 4.500 uIU/mL  Retic Panel  Result Value Ref Range   Retic Ct Pct 1.5 0.4 - 3.1 %   RBC. 4.13 3.87 - 5.11 MIL/uL   Retic Count, Absolute 61.5 19.0 - 186.0 K/uL   Immature Retic Fract 14.3 2.3 - 15.9 %   Reticulocyte Hemoglobin 27.9 (L) >27.9 pg  Lactate dehydrogenase  Result Value Ref Range   LDH 143 98 - 192 U/L  Iron and TIBC  Result Value Ref Range   Iron 103 28 - 170 ug/dL   TIBC 406 250 - 450 ug/dL   Saturation Ratios 25 10.4 - 31.8 %   UIBC 303 ug/dL  Folate  Result Value Ref Range   Folate 8.4 >5.9 ng/mL  Ferritin  Result Value Ref Range   Ferritin 12 11 - 307 ng/mL  CBC with Differential/Platelet  Result Value Ref Range   WBC 3.2 (L) 4.0 - 10.5 K/uL   RBC 4.08 3.87 - 5.11 MIL/uL   Hemoglobin 10.5 (L) 12.0 - 15.0 g/dL   HCT 33.1 (L) 36 - 46 %   MCV 81.1 80.0 - 100.0 fL   MCH 25.7 (L) 26.0 - 34.0 pg   MCHC 31.7 30.0 - 36.0 g/dL   RDW 17.9 (H) 11.5 - 15.5 %   Platelets 142 (L) 150 - 400 K/uL   nRBC 0.0 0.0 - 0.2 %   Neutrophils Relative % 62 %   Neutro Abs 2.0 1.7 - 7.7 K/uL   Lymphocytes Relative 22 %   Lymphs Abs 0.7 0.7 - 4.0 K/uL   Monocytes Relative 13 %   Monocytes Absolute 0.4 0.1 - 1.0  K/uL   Eosinophils Relative 2 %   Eosinophils Absolute 0.1 0.0 - 0.5 K/uL   Basophils Relative 1 %   Basophils Absolute 0.0 0.0 - 0.1 K/uL   Immature Granulocytes 0 %   Abs Immature Granulocytes 0.01 0.00 - 0.07 K/uL  Technologist smear review  Result Value Ref Range   WBC Morphology MORPHOLOGY UNREMARKABLE    RBC Morphology OCC OVALOCYTES NOTED ON SMEAR    Tech Review Normal platelet morphology        Assessment & Plan:   1. Bilateral lower extremity edema Improving Continue to work on avoiding sweets and avoiding hyperglycemia Low salt Continue walking, elevating legs, OTC compression socks/knee highs Can use OTC hydrocortisone cream to rash to leg - avoid any areas of broken skin  2. Diabetes mellitus type 2 in obese [facility]) Diet improving, continue to work on avoiding sweets Can continue metformin 1000 mg with a meal in am and 500 mg with meal in pm   3. Need for vaccination - Flu Vaccine QUAD High Dose(Fluad)      Delsa Grana, PA-C 10/14/20 11:25 AM

## 2020-10-18 ENCOUNTER — Ambulatory Visit
Admission: RE | Admit: 2020-10-18 | Discharge: 2020-10-18 | Disposition: A | Discharge status: Home / Self Care | Payer: Medicare PPO | Source: Ambulatory Visit | Attending: Oncology | Admitting: Oncology

## 2020-10-18 ENCOUNTER — Other Ambulatory Visit: Payer: Self-pay

## 2020-10-18 DIAGNOSIS — R161 Splenomegaly, not elsewhere classified: Secondary | ICD-10-CM | POA: Insufficient documentation

## 2020-10-18 DIAGNOSIS — R932 Abnormal findings on diagnostic imaging of liver and biliary tract: Secondary | ICD-10-CM | POA: Diagnosis not present

## 2020-10-19 ENCOUNTER — Telehealth: Payer: Self-pay

## 2020-10-19 DIAGNOSIS — D509 Iron deficiency anemia, unspecified: Secondary | ICD-10-CM

## 2020-10-19 NOTE — Telephone Encounter (Signed)
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 Recommend patient to establish care with gastroenterology-for 2 reasons, iron deficiency anemia, and cirrhosis.  If she does not have a preferred gastroenterology, please refer to Dr. Tobi Bastos thank you

## 2020-10-19 NOTE — Telephone Encounter (Signed)
Patient notified of ultrasound results. Referral for GI sent to Dr. Tobi Bastos.

## 2020-10-31 ENCOUNTER — Other Ambulatory Visit: Payer: Self-pay | Admitting: Family Medicine

## 2020-10-31 NOTE — Telephone Encounter (Signed)
Copied from CRM [id number]. Topic: Quick Communication - Rx Refill/Question >> Oct 31, 2020  9:28 AM Marylen Ponto wrote: Pt sister stated the Rx for metFORMIN was increased so pt needs the Rx to be refilled   Medication: metFORMIN (GLUCOPHAGE-XR) 500 MG 24 hr tablet  Has the patient contacted their pharmacy? yes   Preferred Pharmacy (with phone number or street name): [id number] [place] D[place], [place] - [address] AT [facility] OF [address] [facility] Phone: [phone]  Fax: [phone]  Agent: Please be advised that RX refills may take up to 3 business days. We ask that you follow-up with your pharmacy.

## 2020-11-07 ENCOUNTER — Telehealth: Payer: Self-pay

## 2020-11-07 NOTE — Telephone Encounter (Signed)
Copied from CRM [id number]. Topic: General - Other >> Nov 04, 2020  3:42 PM Jaquita Rector A wrote: Reason for CRM: Patient sister Harriett Sine called in to inform Leisa that the Rx for her metFORMIN (GLUCOPHAGE-XR) 500 MG 24 hr tablet is wrong and should read 3 tabs daily but medication read only 2 tabs per Harriett Sine this was addressed at last visit but to no avail. Asking for this Rx to be changed and sent to her pharmacy

## 2020-11-08 NOTE — Telephone Encounter (Signed)
On your last note you got 1000mg  Metformin in Am and 500 in PM. They have been taking it this way and her sugars under 300 now. The pharmacy only gave them 180 tablets. They need 270 on next refill

## 2020-11-08 NOTE — Telephone Encounter (Signed)
I do not write metformin for three times daily  If the dose was needing to be increased I increase the dose taking two times a day - please see my last note and plan for Ms. Soucy (may need to look at plan noted on lab results as well before calling them back)  thanks

## 2020-11-08 NOTE — Telephone Encounter (Signed)
Please check script

## 2020-11-15 ENCOUNTER — Encounter: Payer: Self-pay | Admitting: Family Medicine

## 2020-11-15 ENCOUNTER — Ambulatory Visit (INDEPENDENT_AMBULATORY_CARE_PROVIDER_SITE_OTHER): Payer: Medicare PPO | Admitting: Family Medicine

## 2020-11-15 ENCOUNTER — Other Ambulatory Visit: Payer: Self-pay

## 2020-11-15 VITALS — BP 128/72 | HR 96 | Temp 98.3°F | Resp 14 | Ht 62.0 in | Wt 168.2 lb

## 2020-11-15 DIAGNOSIS — Z5181 Encounter for therapeutic drug level monitoring: Secondary | ICD-10-CM

## 2020-11-15 DIAGNOSIS — D649 Anemia, unspecified: Secondary | ICD-10-CM

## 2020-11-15 DIAGNOSIS — E1169 Type 2 diabetes mellitus with other specified complication: Secondary | ICD-10-CM

## 2020-11-15 DIAGNOSIS — E890 Postprocedural hypothyroidism: Secondary | ICD-10-CM | POA: Diagnosis not present

## 2020-11-15 DIAGNOSIS — E669 Obesity, unspecified: Secondary | ICD-10-CM

## 2020-11-15 DIAGNOSIS — R600 Localized edema: Secondary | ICD-10-CM | POA: Diagnosis not present

## 2020-11-15 NOTE — Progress Notes (Signed)
Name: Whitney [name]   MRN: [id number]    DOB: 25-Aug-1941   Date:11/15/2020       Progress Note  Chief Complaint  Patient presents with  . Follow-up    1 month recheck     Subjective:   Whitney [name] is a 79 y.o. female, presents to clinic for routine f/up  DM:   Pt managing DM with metformin increased to 1000 mg per day, and again increased to 1000 mg qam and 500 mg qpm with meals Reports good med compliance Pt has no SE from meds. Blood sugars last couple days (fasting 120-130) Advised previously to decrease sweets/junkfood, calories - had gained weight and was eating a lot of sweets Denies: Polyuria, polydipsia, vision changes, neuropathy, hypoglycemia Recent pertinent labs: Lab Results  Component Value Date   HGBA1C 7.2 (H) 08/04/2020   HGBA1C 6.2 (H) 01/05/2020   HGBA1C 7.3 (A) 07/20/2019   Standard of care and health maintenance: Urine Microalbumin:  due Foot exam:  Done today DM eye exam:  UTD ACEI/ARB:  Not on Statin:  lipitor   Saw [place] yesterday A1C 7.4- new consult for DM (I did not refer her?) Drema Halon, NP - 11/14/2020 11:15 AM EST  Formatting of this note might be different from the original. - Increase metformin from 500 mg (1 tablet twice daily) up to 2 tablets in the morning and 1 tablet in the evening (total of 3 tablets daily  - Check blood sugars fasting each morning . Please bring your meter to next clinic visit  - Return to clinic in 3 months  Due for podiatry f/up for nails   Anemia following surgery: Has consulted Dr. Tasia Catchings- still on oral iron supplements, has been improving Hemoglobin  Date Value Ref Range Status  10/11/2020 10.5 (L) 12.0 - 15.0 g/dL Final  09/18/2020 9.5 (L) 12.0 - 15.0 g/dL Final  09/13/2020 9.9 (L) 11.7 - 15.5 g/dL Final  01/05/2020 12.2 11.7 - 15.5 g/dL Final   Lab Results  Component Value Date   IRON 103 10/11/2020   TIBC 406 10/11/2020   FERRITIN 12 10/11/2020     LE edema: better-she  has a little bit of an indentation from her sock but has not had any recurrence of the severe lower extremity edema or pedal edema that she had previously.       Current Outpatient Medications:  .  atorvastatin (LIPITOR) 40 MG tablet, Take 1 tablet (40 mg total) by mouth at bedtime., Disp: 90 tablet, Rfl: 3 .  buPROPion (WELLBUTRIN XL) 150 MG 24 hr tablet, TAKE 1 TABLET(150 MG) BY MOUTH DAILY, Disp: 90 tablet, Rfl: 1 .  carbidopa-levodopa (SINEMET IR) 25-100 MG tablet, Take by mouth. Take 1 tablet by mouth 4 (four) times daily, Disp: , Rfl:  .  cholecalciferol (VITAMIN D3) 25 MCG (1000 UNIT) tablet, Take 1,000 Units by mouth daily. , Disp: , Rfl:  .  ferrous sulfate 325 (65 FE) MG tablet, Take 325 mg by mouth daily with breakfast. , Disp: , Rfl:  .  metFORMIN (GLUCOPHAGE-XR) 500 MG 24 hr tablet, Take 1 tablet (500 mg total) by mouth 2 (two) times daily with a meal. TAKE 1 TABLET(500 MG) BY MOUTH BID (Patient taking differently: Take 500 mg by mouth in the morning, at noon, and at bedtime. TAKE 1 TABLET(500 MG) BY MOUTH BID), Disp: 180 tablet, Rfl: 3 .  mirabegron ER (MYRBETRIQ) 25 MG TB24 tablet, Take 1 tablet (25 mg total) by mouth daily., Disp: 30  tablet, Rfl: 11 .  rOPINIRole (REQUIP) 0.25 MG tablet, Take 0.5 mg by mouth 3 (three) times daily. , Disp: , Rfl:  .  solifenacin (VESICARE) 5 MG tablet, Take 1 tablet (5 mg total) by mouth daily. (Patient taking differently: Take 5 mg by mouth daily after supper. ), Disp: 30 tablet, Rfl: 11 .  vitamin B-12 (CYANOCOBALAMIN) 500 MCG tablet, Take 500 mcg by mouth daily., Disp: , Rfl:  .  ACCU-CHEK GUIDE test strip, , Disp: , Rfl:  .  Accu-Chek Softclix Lancets lancets, , Disp: , Rfl:  .  acetaminophen (TYLENOL) 500 MG tablet, Take 500-1,000 mg by mouth every 6 (six) hours as needed (for pain.). (Patient not taking: Reported on 10/14/2020), Disp: , Rfl:  .  Alcohol Swabs (B-D SINGLE USE SWABS REGULAR) PADS, , Disp: , Rfl:  .  bacitracin ointment, Apply  1 application topically every 8 (eight) hours. (Patient not taking: Reported on 10/14/2020), Disp: 120 g, Rfl: 0 .  Blood Glucose Monitoring Suppl (ACCU-CHEK GUIDE ME) w/Device KIT, , Disp: , Rfl:   Patient Active Problem List   Diagnosis Date Noted  . 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 Impaired mobility and ADLs 04/15/2020  . Vitamin D deficiency 07/20/2019  . 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 At high risk for falls 07/20/2019  . Parkinson disease ([place]) 01/15/2019  . Hyperlipidemia 01/15/2019  . Diabetes mellitus type 2 in obese ([place]) 01/13/2019  . Overactive bladder 01/13/2019  . Obesity (BMI 30.0-34.9) 01/13/2019    Past Surgical History:  Procedure Laterality Date  . ABDOMINAL HYSTERECTOMY  1980  . BLADDER REPAIR  1980   post hysterectomy  . NECK SURGERY     anterior cervical disc fusion  . THYROIDECTOMY Left 08/03/2020   Procedure: Alfredia Ferguson;  Surgeon: Clyde Canterbury, MD;  Location: [facility] ORS;  Service: ENT;  Laterality: Left;    Family History  Problem Relation Age of Onset  . Stroke Mother   . Hypertension Mother   . Diabetes Father   . Heart disease Father   . Lung cancer Father   . Colon cancer Sister   . Diabetes Sister   . Kidney disease Daughter   . Kidney cancer Daughter   . Pulmonary embolism Sister   . Diabetes Sister   . Kidney disease Sister   . Heart disease Sister   . Atrial fibrillation Sister   . Diabetes Brother   . Heart disease Brother   . AAA (abdominal aortic aneurysm) Brother   . Cancer Brother        skin    Social History   Tobacco Use  . Smoking status: Former Smoker    Packs/day: 2.00    Years: 40.00    Pack years: 80.00    Types: Cigarettes    Quit date: 12/10/2018    Years since quitting: 1.9  . Smokeless tobacco: Never Used  Vaping Use  . Vaping Use: Never used  Substance Use Topics  . Alcohol use: Not Currently  . Drug use: Never     No Known Allergies  Health Maintenance  Topic  Date Due  . DEXA SCAN  Never done  . MAMMOGRAM  03/15/2018  . FOOT EXAM  01/14/2020  . URINE MICROALBUMIN  01/14/2020  . TETANUS/TDAP  04/07/2021 (Originally 09/13/1960)  . PNA vac Low Risk Adult (2 of 2 - PPSV23) 04/15/2021 (Originally 12/10/2016)  . HEMOGLOBIN A1C  02/04/2021  . OPHTHALMOLOGY EXAM  06/07/2021  . INFLUENZA VACCINE  Completed  . COVID-19 Vaccine  Completed  . Hepatitis C Screening  Completed    Chart Review Today: I personally reviewed active problem list, medication list, allergies, family history, social history, health maintenance, notes from last encounter, lab results, imaging with the patient/caregiver today.   Review of Systems  10 Systems reviewed and are negative for acute change except as noted in the HPI.  Objective:   Vitals:   11/15/20 1137  BP: 128/72  Pulse: 96  Resp: 14  Temp: 98.3 F (36.8 C)  TempSrc: Oral  SpO2: 99%  Weight: 168 lb 3.2 oz (76.3 kg)  Height: '5\' 2"'  (1.575 m)    Body mass index is 30.76 kg/m.  Physical Exam Vitals and nursing note reviewed.  Constitutional:      General: She is not in acute distress.    Appearance: Normal appearance. She is obese. She is not ill-appearing, toxic-appearing or diaphoretic.  HENT:     Head: Normocephalic and atraumatic.     Right Ear: External ear normal.     Left Ear: External ear normal.  Eyes:     General:        Right eye: No discharge.        Left eye: No discharge.     Conjunctiva/sclera: Conjunctivae normal.  Cardiovascular:     Rate and Rhythm: Normal rate and regular rhythm.     Pulses: Normal pulses.          Radial pulses are 2+ on the right side and 2+ on the left side.       Dorsalis pedis pulses are 2+ on the right side and 2+ on the left side.       Posterior tibial pulses are 2+ on the right side and 2+ on the left side.     Heart sounds: Normal heart sounds.     Comments: Lower pretibial 1+ pitting edema no pedal edema Pulmonary:     Effort: Pulmonary effort is  normal. No respiratory distress.     Breath sounds: Normal breath sounds. No stridor. No wheezing or rales.  Abdominal:     General: Bowel sounds are normal.     Palpations: Abdomen is soft.  Musculoskeletal:     Right lower leg: 1+ Edema present.     Left lower leg: 1+ Edema present.  Feet:     Right foot:     Skin integrity: Skin integrity normal. No ulcer, blister, skin breakdown, erythema, callus, dry skin or fissure.     Toenail Condition: Right toenails are long.     Left foot:     Skin integrity: Skin integrity normal. No ulcer, blister, skin breakdown, erythema, callus or dry skin.     Toenail Condition: Left toenails are long.     Comments: Diabetic foot exam done today Skin:    Comments: Bilateral lower extremities with multiple areas of reticular veins and varicose veins  Neurological:     Mental Status: Mental status is at baseline.  Psychiatric:        Mood and Affect: Mood normal.        Behavior: Behavior normal.      Diabetic Foot Exam - Simple   Simple Foot Form Diabetic Foot exam was performed with the following findings: Yes 11/15/2020 11:45 AM  Visual Inspection No deformities, no ulcerations, no other skin breakdown bilaterally: Yes Sensation Testing Intact to touch and monofilament testing bilaterally: Yes Pulse Check Posterior Tibialis and Dorsalis pulse intact bilaterally: Yes Comments Mild  edema, abrasion to leg, signs of venous insufficiency      Assessment & Plan:   1. Diabetes mellitus type 2 in obese (HCC) A1c has increased a little up to 7.4, patient went to endocrinology for consult, she has increase Metformin to 1500 mg daily Again counseled patient to cut out additional calories and sweets she is still eating ice cream every day She has tolerated the dose increase of Metformin and is taking 1000 mg in the morning and 500 mg in the evening with food Foot exam done today, she is due for urine microalbumin test She did not complete labs  because they were done recently at endocrinology, reviewed through care everywhere - COMPLETE METABOLIC PANEL WITH GFR - Hemoglobin A1c - Microalbumin, urine  2. Bilateral lower extremity edema Still some lower pretibial edema but no pedal edema and overall improved Likely due to vascular insufficiency and stasis - COMPLETE METABOLIC PANEL WITH GFR  3. S/P partial thyroidectomy Doing well after surgery, her TSH levels have been normal with repeated labs of the past couple months Energy, weight and swelling all appear to be returning to normal and to be stable  4. Anemia, unspecified type Patient has followed up with Dr. Tasia Catchings, some abnormal labs were worked up which led to imaging which revealed some mild splenomegaly and some possible cirrhosis she has been referred to GI for further work-up - CBC with Differential/Platelet  5. Encounter for medication monitoring Patient labs do not need to be done today since her A1c was recently done by endocrinology although this appointment was intentionally set up just to recheck the A1c - -  COMPLETE METABOLIC PANEL WITH GFR - CBC with Differential/Platelet - Hemoglobin A1c - Microalbumin, urine   Return for 4 month routine f/up.   Delsa Grana, PA-C 11/15/20 11:47 AM

## 2020-11-28 ENCOUNTER — Ambulatory Visit: Payer: Medicare PPO | Attending: Internal Medicine

## 2020-11-28 DIAGNOSIS — Z23 Encounter for immunization: Secondary | ICD-10-CM

## 2020-11-28 NOTE — Progress Notes (Signed)
   Covid-19 Vaccination Clinic  Name:  Whitney [name]    MRN: [id number] DOB: 03-18-1941  11/28/2020  [name] was observed post Covid-19 immunization for 15 minutes without incident. She was provided with Vaccine Information Sheet and instruction to access the V-Safe system.   [name] was instructed to call 911 with any severe reactions post vaccine: Marland Kitchen Difficulty breathing  . Swelling of face and throat  . A fast heartbeat  . A bad rash all over body  . Dizziness and weakness   Immunizations Administered    Name Date Dose VIS Date Route   Pfizer COVID-19 Vaccine 11/28/2020  2:32 PM 0.3 mL 09/28/2020 Intramuscular   Manufacturer: [employer], [employer]   Lot: [id number]   NDC: [address]-2

## 2020-12-14 ENCOUNTER — Other Ambulatory Visit: Payer: Self-pay

## 2020-12-14 ENCOUNTER — Encounter: Payer: Self-pay | Admitting: Gastroenterology

## 2020-12-14 ENCOUNTER — Ambulatory Visit (INDEPENDENT_AMBULATORY_CARE_PROVIDER_SITE_OTHER): Payer: Medicare PPO | Admitting: Gastroenterology

## 2020-12-14 VITALS — BP 118/73 | HR 85 | Temp 97.6°F | Ht 62.0 in | Wt 170.0 lb

## 2020-12-14 DIAGNOSIS — D509 Iron deficiency anemia, unspecified: Secondary | ICD-10-CM

## 2020-12-14 MED ORDER — NA SULFATE-K SULFATE-MG SULF 17.5-3.13-1.6 GM/177ML PO SOLN: ORAL | 0 refills | Status: DC

## 2020-12-14 NOTE — Progress Notes (Signed)
Whitney [name] [address]  [place]  [place], [place] [address]  Main: [phone]  Fax: [phone]   Gastroenterology Consultation  Referring Provider:     Earlie Server, MD Primary Care Physician:  Delsa Grana, PA-C Reason for Consultation:     Iron deficiency anemia        HPI:   Chief complaint: Anemia  Whitney [name] is a 80 y.o. y/o female referred for consultation & management  by Dr. Delsa Grana, PA-C.  Patient presents for evaluation of iron deficiency anemia.  She denies any sources of active bleeding. The patient denies abdominal or flank pain, anorexia, nausea or vomiting, dysphagia, change in bowel habits or black or bloody stools or weight loss.  Reports [place] and states it was [place].  Has had other colonoscopies prior to that as well, according to the patient, which were normal.  None of her previous procedure reports are available to [place].  Denies any family history of colon cancer.  Past Medical History:  Diagnosis Date  . Arthritis   . Broken shoulder    left humerus   . COVID-19   . Depression   . Diabetes mellitus without complication ([place])   . Goiter   . Hyperlipidemia   . Overactive bladder   . Parkinson's disease ([place])   . Pneumonia   . Unsteady gait     Past Surgical History:  Procedure Laterality Date  . ABDOMINAL HYSTERECTOMY  1980  . BLADDER REPAIR  1980   post hysterectomy  . NECK SURGERY     anterior cervical disc fusion  . THYROIDECTOMY Left 08/03/2020   Procedure: Whitney [name];  Surgeon: Clyde Canterbury, MD;  Location: [facility] ORS;  Service: ENT;  Laterality: Left;    Prior to Admission medications   Medication Sig Start Date End Date Taking? Authorizing Provider  buPROPion (WELLBUTRIN XL) 150 MG 24 hr tablet TAKE 1 TABLET(150 MG) BY MOUTH DAILY 04/01/20  Yes Delsa Grana, PA-C  carbidopa-levodopa (SINEMET IR) 25-100 MG tablet Take by mouth. Take 1 tablet by mouth 4 (four)  times daily 09/28/20 09/28/21 Yes [provider]  cholecalciferol (VITAMIN D3) 25 MCG (1000 UNIT) tablet Take 1,000 Units by mouth daily.    Yes [provider]  ferrous sulfate 325 (65 FE) MG tablet Take 325 mg by mouth daily with breakfast.    Yes [provider]  metFORMIN (GLUCOPHAGE-XR) 500 MG 24 hr tablet Take 1 tablet (500 mg total) by mouth 2 (two) times daily with a meal. TAKE 1 TABLET(500 MG) BY MOUTH BID Patient taking differently: Take 500 mg by mouth in the morning, at noon, and at bedtime. TAKE 1 TABLET(500 MG) BY MOUTH BID 09/01/20  Yes Delsa Grana, PA-C  mirabegron ER (MYRBETRIQ) 25 MG TB24 tablet Take 1 tablet (25 mg total) by mouth daily. 01/01/20  Yes Festus Aloe, MD  Na Sulfate-K Sulfate-Mg Sulf 17.5-3.13-1.6 GM/177ML SOLN At 5 PM the day before procedure take 1 bottle and 5 hours before procedure take 1 bottle. 12/14/20  Yes Virgel Manifold, MD  rOPINIRole (REQUIP) 0.5 MG tablet Take 1 tablet by mouth in the morning, at noon, and at bedtime. 11/29/20  Yes [provider]  solifenacin (VESICARE) 5 MG tablet Take 1 tablet (5 mg total) by mouth daily. Patient taking differently: Take 5 mg by mouth daily after supper. 01/01/20  Yes Festus Aloe, MD  vitamin B-12 (CYANOCOBALAMIN) 500 MCG tablet Take 500 mcg by mouth daily.  Yes [provider]  ACCU-CHEK GUIDE test strip  04/27/20   [provider]  Accu-Chek Softclix Lancets lancets  04/27/20   [provider]  acetaminophen (TYLENOL) 500 MG tablet Take 500-1,000 mg by mouth every 6 (six) hours as needed (for pain.). Patient not taking: No sig reported    [provider]  Alcohol Swabs (B-D SINGLE USE SWABS REGULAR) PADS  04/27/20   [provider]  bacitracin ointment Apply 1 application topically every 8 (eight) hours. Patient not taking: Reported on 12/14/2020 08/04/20   Clyde Canterbury, MD  Blood Glucose Monitoring Suppl ([place])  w/Device KIT  04/27/20   [provider]    Family History  Problem Relation Age of Onset  . Stroke Mother   . Hypertension Mother   . Diabetes Father   . Heart disease Father   . Lung cancer Father   . Colon cancer Sister   . Diabetes Sister   . Kidney disease Daughter   . Kidney cancer Daughter   . Pulmonary embolism Sister   . Diabetes Sister   . Kidney disease Sister   . Heart disease Sister   . Atrial fibrillation Sister   . Diabetes Brother   . Heart disease Brother   . AAA (abdominal aortic aneurysm) Brother   . Cancer Brother        skin     Social History   Tobacco Use  . Smoking status: Former Smoker    Packs/day: 2.00    Years: 40.00    Pack years: 80.00    Types: Cigarettes    Quit date: 12/10/2018    Years since quitting: 2.0  . Smokeless tobacco: Never Used  Vaping Use  . Vaping Use: Never used  Substance Use Topics  . Alcohol use: Not Currently  . Drug use: Never    Allergies as of 12/14/2020  . (No Known Allergies)    Review of Systems:    All systems reviewed and negative except where noted in HPI.   Physical Exam:  BP 118/73   Pulse 85   Temp 97.6 F (36.4 C) (Oral)   Ht '5\' 2"'  (1.575 m)   Wt 170 lb (77.1 kg)   BMI 31.09 kg/m  No LMP recorded. Patient has had a hysterectomy. Psych:  Alert and cooperative. Normal mood and affect. General:   Alert,  Well-developed, well-nourished, pleasant and cooperative in NAD Head:  Normocephalic and atraumatic. Eyes:  Sclera clear, no icterus.   Conjunctiva pink. Ears:  Normal auditory acuity. Nose:  No deformity, discharge, or lesions. Mouth:  No deformity or lesions,oropharynx pink & moist. Neck:  Supple; no masses or thyromegaly. Abdomen:  Normal bowel sounds.  No bruits.  Soft, non-tender and non-distended without masses, hepatosplenomegaly or hernias noted.  No guarding or rebound tenderness.    Msk:  Symmetrical without gross deformities. Good, equal movement & strength  bilaterally. Pulses:  Normal pulses noted. Extremities:  No clubbing or edema.  No cyanosis. Neurologic:  Alert and oriented x3;  grossly normal neurologically. Skin:  Intact without significant lesions or rashes. No jaundice. Lymph Nodes:  No significant cervical adenopathy. Psych:  Alert and cooperative. Normal mood and affect.   Labs: CBC    Component Value Date/Time   WBC 3.2 (L) 10/11/2020 1159   RBC 4.13 10/11/2020 1159   RBC 4.08 10/11/2020 1159   HGB 10.5 (L) 10/11/2020 1159   HCT 33.1 (L) 10/11/2020 1159   PLT 142 (L) 10/11/2020 1159  MCV 81.1 10/11/2020 1159   MCH 25.7 (L) 10/11/2020 1159   MCHC 31.7 10/11/2020 1159   RDW 17.9 (H) 10/11/2020 1159   LYMPHSABS 0.7 10/11/2020 1159   MONOABS 0.4 10/11/2020 1159   EOSABS 0.1 10/11/2020 1159   BASOSABS 0.0 10/11/2020 1159   CMP     Component Value Date/Time   NA 138 09/18/2020 1259   K 4.1 09/18/2020 1259   CL 103 09/18/2020 1259   CO2 25 09/18/2020 1259   GLUCOSE 247 (H) 09/18/2020 1259   BUN 13 09/18/2020 1259   CREATININE 0.61 09/18/2020 1259   CREATININE 0.58 (L) 09/13/2020 1023   CALCIUM 8.8 (L) 09/18/2020 1259   PROT 6.6 09/18/2020 1259   ALBUMIN 3.5 09/18/2020 1259   AST 35 09/18/2020 1259   ALT 25 09/18/2020 1259   ALKPHOS 112 09/18/2020 1259   BILITOT 0.6 09/18/2020 1259   GFRNONAA >60 09/18/2020 1259   GFRNONAA 88 09/13/2020 1023   GFRAA 102 09/13/2020 1023    Imaging Studies: No results found.  Assessment and Plan:   Wilder Amodei is a 80 y.o. y/o female has been referred for iron deficiency anemia  This is a new diagnosis for the patient and she has no history of such in the past  Therefore, important to rule out colon cancer at this time given she has not had a colonoscopy in at least 5 years as per the patient  I have discussed alternative options, risks & benefits,  which include, but are not limited to, bleeding, infection, perforation,respiratory complication & drug reaction.  The  patient agrees with this plan & written consent will be obtained.    Alternative options of conservative management were discussed in detail, including but not limited to medication management, foregoing endoscopic procedures at this time and others.       Dr Whitney [name]  Speech recognition software was used to dictate the above note.

## 2020-12-20 ENCOUNTER — Inpatient Hospital Stay: Payer: Medicare PPO | Attending: Oncology

## 2020-12-20 DIAGNOSIS — D509 Iron deficiency anemia, unspecified: Secondary | ICD-10-CM | POA: Diagnosis present

## 2020-12-20 DIAGNOSIS — M129 Arthropathy, unspecified: Secondary | ICD-10-CM | POA: Insufficient documentation

## 2020-12-20 DIAGNOSIS — Z8616 Personal history of COVID-19: Secondary | ICD-10-CM | POA: Insufficient documentation

## 2020-12-20 DIAGNOSIS — R233 Spontaneous ecchymoses: Secondary | ICD-10-CM

## 2020-12-20 DIAGNOSIS — E785 Hyperlipidemia, unspecified: Secondary | ICD-10-CM | POA: Insufficient documentation

## 2020-12-20 DIAGNOSIS — Z800 Family history of malignant neoplasm of digestive organs: Secondary | ICD-10-CM | POA: Insufficient documentation

## 2020-12-20 DIAGNOSIS — Z7984 Long term (current) use of oral hypoglycemic drugs: Secondary | ICD-10-CM | POA: Diagnosis not present

## 2020-12-20 DIAGNOSIS — E119 Type 2 diabetes mellitus without complications: Secondary | ICD-10-CM | POA: Insufficient documentation

## 2020-12-20 DIAGNOSIS — Z87891 Personal history of nicotine dependence: Secondary | ICD-10-CM | POA: Insufficient documentation

## 2020-12-20 DIAGNOSIS — G20 Parkinson's disease: Secondary | ICD-10-CM | POA: Insufficient documentation

## 2020-12-20 DIAGNOSIS — Z79899 Other long term (current) drug therapy: Secondary | ICD-10-CM | POA: Insufficient documentation

## 2020-12-20 DIAGNOSIS — R932 Abnormal findings on diagnostic imaging of liver and biliary tract: Secondary | ICD-10-CM

## 2020-12-20 DIAGNOSIS — R5383 Other fatigue: Secondary | ICD-10-CM | POA: Diagnosis not present

## 2020-12-20 DIAGNOSIS — R238 Other skin changes: Secondary | ICD-10-CM

## 2020-12-20 DIAGNOSIS — D696 Thrombocytopenia, unspecified: Secondary | ICD-10-CM | POA: Diagnosis not present

## 2020-12-20 DIAGNOSIS — K7460 Unspecified cirrhosis of liver: Secondary | ICD-10-CM | POA: Insufficient documentation

## 2020-12-20 DIAGNOSIS — R161 Splenomegaly, not elsewhere classified: Secondary | ICD-10-CM | POA: Diagnosis not present

## 2020-12-20 DIAGNOSIS — Z8051 Family history of malignant neoplasm of kidney: Secondary | ICD-10-CM | POA: Diagnosis not present

## 2020-12-20 DIAGNOSIS — F329 Major depressive disorder, single episode, unspecified: Secondary | ICD-10-CM | POA: Insufficient documentation

## 2020-12-20 LAB — FERRITIN: Ferritin: 37 ng/mL (ref 11–307)

## 2020-12-20 LAB — IRON AND TIBC
Iron: 67 ug/dL (ref 28–170)
Saturation Ratios: 19 % (ref 10.4–31.8)
TIBC: 353 ug/dL (ref 250–450)
UIBC: 286 ug/dL

## 2020-12-20 LAB — CBC WITH DIFFERENTIAL/PLATELET
Abs Immature Granulocytes: 0.01 10*3/uL (ref 0.00–0.07)
Basophils Absolute: 0 10*3/uL (ref 0.0–0.1)
Basophils Relative: 1 %
Eosinophils Absolute: 0.1 10*3/uL (ref 0.0–0.5)
Eosinophils Relative: 3 %
HCT: 38.1 % (ref 36.0–46.0)
Hemoglobin: 12.3 g/dL (ref 12.0–15.0)
Immature Granulocytes: 0 %
Lymphocytes Relative: 21 %
Lymphs Abs: 0.8 10*3/uL (ref 0.7–4.0)
MCH: 27.4 pg (ref 26.0–34.0)
MCHC: 32.3 g/dL (ref 30.0–36.0)
MCV: 84.9 fL (ref 80.0–100.0)
Monocytes Absolute: 0.4 10*3/uL (ref 0.1–1.0)
Monocytes Relative: 10 %
Neutro Abs: 2.4 10*3/uL (ref 1.7–7.7)
Neutrophils Relative %: 65 %
Platelets: 107 10*3/uL — ABNORMAL LOW (ref 150–400)
RBC: 4.49 MIL/uL (ref 3.87–5.11)
RDW: 17.4 % — ABNORMAL HIGH (ref 11.5–15.5)
WBC: 3.7 10*3/uL — ABNORMAL LOW (ref 4.0–10.5)
nRBC: 0 % (ref 0.0–0.2)

## 2020-12-22 ENCOUNTER — Encounter: Payer: Self-pay | Admitting: Oncology

## 2020-12-22 ENCOUNTER — Inpatient Hospital Stay (HOSPITAL_BASED_OUTPATIENT_CLINIC_OR_DEPARTMENT_OTHER): Payer: Medicare PPO | Admitting: Oncology

## 2020-12-22 VITALS — BP 118/60 | HR 81 | Temp 97.4°F | Resp 18 | Wt 170.0 lb

## 2020-12-22 DIAGNOSIS — R161 Splenomegaly, not elsewhere classified: Secondary | ICD-10-CM

## 2020-12-22 DIAGNOSIS — D509 Iron deficiency anemia, unspecified: Secondary | ICD-10-CM | POA: Diagnosis not present

## 2020-12-22 DIAGNOSIS — D696 Thrombocytopenia, unspecified: Secondary | ICD-10-CM | POA: Diagnosis not present

## 2020-12-22 DIAGNOSIS — K7460 Unspecified cirrhosis of liver: Secondary | ICD-10-CM | POA: Diagnosis not present

## 2020-12-22 LAB — COMP PANEL: LEUKEMIA/LYMPHOMA

## 2020-12-22 NOTE — Progress Notes (Signed)
Hematology/Oncology Consult note [facility] Telephone:([phone] [phone]   Patient Care Team: Delsa Grana, PA-C as PCP - General (Family Medicine) Vladimir Crofts, MD as Consulting Physician (Neurology) Clyde Canterbury, MD as Referring Physician (Otolaryngology) Festus Aloe, MD as Consulting Physician (Urology) Earlie Server, MD as Consulting Physician (Hematology and Oncology)  REFERRING PROVIDER: Delsa Grana, PA-C CHIEF COMPLAINTS/REASON FOR VISIT:  Follow-up for iron deficiency anemia  HISTORY OF PRESENTING ILLNESS:  Whitney [name] is a  80 y.o.  female with PMH listed below was seen in consultation at the request of Delsa Grana, PA-C   for evaluation of iron deficiency anemia.   Reviewed patient's recent labs  10/5/2021Labs revealed anemia with hemoglobin of 9.9 Iron panel ferritin 8, iron saturation 6, TIBC 4 7. Reviewed patient's previous labs ordered by other physician, anemia is new onset since October 2021. No aggravating or improving factors.  Associated signs and symptoms: Patient reports fatigue.  SOB with exertion.  Denies weight loss, easy bruising, hematochezia, hemoptysis, hematuria. Context:  History of iron deficiency: Denies any previous history of iron deficiency.  She has been started on ferrous sulfate 325 mg daily.  She reports tolerating iron treatments very well. Rectal bleeding: She denies any bleeding she recalls that her stool has been dark even before iron supplementation Menstrual bleeding/ Vaginal bleeding : Denies Hematemesis or hemoptysis : denies Blood in urine : denies   Family history positive for sister Izora Gala with colon cancer, another sister with kidney cancer and a daughter with kidney cancer.    06/29/2020, CT chest lung cancer screening showed lung RADS 2 Irregular liver contour consistent with cirrhosis.  Splenomegaly Markedly enlarged and a low-attenuation left thyroid.  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  Post surgery TSH was normal at 3 on 09/01/2020. Patient has increased bilateral lower extremity swelling and has been to emergency room for concern of myxedema.  TSH and T4 have been normal.   INTERVAL HISTORY [name] is a 80 y.o. female who has above history reviewed by me today presents for follow up visit for management of iron deficiency anemia Problems and complaints are listed below: Patient has been taking oral iron supplementation twice daily.  She reports tolerating it quite well.  During the interval patient was seen by Dr. Bonna Gains and was recommended to proceed with colonoscopy. Patient has not decided if she wants to proceed or not. Today she reports feeling at baseline.  No significant improvement of her fatigue.  No new complaints.  She was accompanied by her sister   Review of Systems  Constitutional: Positive for fatigue. Negative for appetite change, chills and fever.  HENT:   Negative for hearing loss and voice change.   Eyes: Negative for eye problems.  Respiratory: Negative for chest tightness and cough.   Cardiovascular: Positive for leg swelling. Negative for chest pain.  Gastrointestinal: Negative for abdominal distention, abdominal pain and blood in stool.  Endocrine: Negative for hot flashes.  Genitourinary: Negative for difficulty urinating and frequency.   Musculoskeletal: Negative for arthralgias.  Skin: Negative for itching and rash.  Neurological: Negative for extremity weakness.  Hematological: Negative for adenopathy. Bruises/bleeds easily.  Psychiatric/Behavioral: Negative for confusion.    MEDICAL HISTORY:  Past Medical History:  Diagnosis Date  . Arthritis   . Broken shoulder    left humerus   . COVID-19   . Depression   .  Diabetes mellitus without  complication ([place])   . Goiter   . Hyperlipidemia   . Overactive bladder   . Parkinson's disease ([place])   . Pneumonia   . Unsteady gait     SURGICAL HISTORY: Past Surgical History:  Procedure Laterality Date  . ABDOMINAL HYSTERECTOMY  1980  . BLADDER REPAIR  1980   post hysterectomy  . NECK SURGERY     anterior cervical disc fusion  . THYROIDECTOMY Left 08/03/2020   Procedure: Alfredia Ferguson;  Surgeon: Clyde Canterbury, MD;  Location: [facility] ORS;  Service: ENT;  Laterality: Left;    SOCIAL HISTORY: Social History   Socioeconomic History  . Marital status: Widowed    Spouse name: Jeneen Rinks  . Number of children: 2  . Years of education: 45  . Highest education level: Some college, no degree  Occupational History  . Occupation: retired Conservation officer, historic buildings  Tobacco Use  . Smoking status: Former Smoker    Packs/day: 2.00    Years: 40.00    Pack years: 80.00    Types: Cigarettes    Quit date: 12/10/2018    Years since quitting: 2.0  . Smokeless tobacco: Never Used  Vaping Use  . Vaping Use: Never used  Substance and Sexual Activity  . Alcohol use: Not Currently  . Drug use: Never  . Sexual activity: Not Currently  Other Topics Concern  . Not on file  Social History Narrative   Lost her husband of 1 years in oct 2019. Moved back from [place] in Nov 2019   Social Determinants of Health   Financial Resource Strain: Low Risk   . Difficulty of Paying Living Expenses: Not hard at all  Food Insecurity: No Food Insecurity  . Worried About Charity fundraiser in the Last Year: Never true  . Ran Out of Food in the Last Year: Never true  Transportation Needs: No Transportation Needs  . Lack of Transportation (Medical): No  . Lack of Transportation (Non-Medical): No  Physical Activity: Inactive  . Days of Exercise per Week: 0 days  . Minutes of Exercise per Session: 0 min  Stress: No Stress Concern Present  . Feeling of Stress : Not at all  Social Connections: Socially Isolated   . Frequency of Communication with Friends and Family: More than three times a week  . Frequency of Social Gatherings with Friends and Family: More than three times a week  . Attends Religious Services: Never  . Active Member of Clubs or Organizations: No  . Attends Archivist Meetings: Never  . Marital Status: Widowed  Intimate Partner Violence: Not At Risk  . Fear of Current or Ex-Partner: No  . Emotionally Abused: No  . Physically Abused: No  . Sexually Abused: No    FAMILY HISTORY: Family History  Problem Relation Age of Onset  . Stroke Mother   . Hypertension Mother   . Diabetes Father   . Heart disease Father   . Lung cancer Father   . Colon cancer Sister   . Diabetes Sister   . Kidney disease Daughter   . Kidney cancer Daughter   . Pulmonary embolism Sister   . Diabetes Sister   . Kidney disease Sister   . Heart disease Sister   . Atrial fibrillation Sister   . Diabetes Brother   . Heart disease Brother   . AAA (abdominal aortic aneurysm) Brother   . Cancer Brother        skin    ALLERGIES:  has No Known Allergies.  MEDICATIONS:  Current Outpatient Medications  Medication Sig Dispense Refill  . ACCU-CHEK GUIDE test strip     . Accu-Chek Softclix Lancets lancets     . acetaminophen (TYLENOL) 500 MG tablet Take 500-1,000 mg by mouth every 6 (six) hours as needed (for pain.).    Marland Kitchen Alcohol Swabs (B-D SINGLE USE SWABS REGULAR) PADS     . Blood Glucose Monitoring Suppl (ACCU-CHEK GUIDE ME) w/Device KIT     . buPROPion (WELLBUTRIN XL) 150 MG 24 hr tablet TAKE 1 TABLET(150 MG) BY MOUTH DAILY 90 tablet 1  . carbidopa-levodopa (SINEMET IR) 25-100 MG tablet Take by mouth. Take 1 tablet by mouth 4 (four) times daily    . cholecalciferol (VITAMIN D3) 25 MCG (1000 UNIT) tablet Take 1,000 Units by mouth daily.     . ferrous sulfate 325 (65 FE) MG tablet Take 325 mg by mouth daily with breakfast.     . metFORMIN (GLUCOPHAGE-XR) 500 MG 24 hr tablet Take 1 tablet  (500 mg total) by mouth 2 (two) times daily with a meal. TAKE 1 TABLET(500 MG) BY MOUTH BID (Patient taking differently: Take 500 mg by mouth in the morning, at noon, and at bedtime. TAKE 1 TABLET(500 MG) BY MOUTH BID) 180 tablet 3  . mirabegron ER (MYRBETRIQ) 25 MG TB24 tablet Take 1 tablet (25 mg total) by mouth daily. 30 tablet 11  . rOPINIRole (REQUIP) 0.5 MG tablet Take 1 tablet by mouth in the morning, at noon, and at bedtime.    . solifenacin (VESICARE) 5 MG tablet Take 1 tablet (5 mg total) by mouth daily. (Patient taking differently: Take 5 mg by mouth daily after supper.) 30 tablet 11  . vitamin B-12 (CYANOCOBALAMIN) 500 MCG tablet Take 500 mcg by mouth daily.    . Na Sulfate-K Sulfate-Mg Sulf 17.5-3.13-1.6 GM/177ML SOLN At 5 PM the day before procedure take 1 bottle and 5 hours before procedure take 1 bottle. (Patient not taking: Reported on 12/22/2020) 354 mL 0   No current facility-administered medications for this visit.     PHYSICAL EXAMINATION: ECOG PERFORMANCE STATUS: 2 - Symptomatic, <50% confined to bed Vitals:   12/22/20 1039  BP: 118/60  Pulse: 81  Resp: 18  Temp: (!) 97.4 F (36.3 C)  SpO2: 97%   Filed Weights   12/22/20 1039  Weight: 170 lb (77.1 kg)    Physical Exam Constitutional:      General: She is not in acute distress.    Appearance: She is obese.     Comments: Patient sits in the wheelchair  HENT:     Head: Normocephalic and atraumatic.  Eyes:     General: No scleral icterus. Cardiovascular:     Rate and Rhythm: Normal rate and regular rhythm.     Heart sounds: Normal heart sounds.  Pulmonary:     Effort: Pulmonary effort is normal. No respiratory distress.     Breath sounds: No wheezing.  Abdominal:     General: Bowel sounds are normal. There is no distension.     Palpations: Abdomen is soft.  Musculoskeletal:        General: No deformity. Normal range of motion.     Cervical back: Normal range of motion and neck supple.  Skin:     General: Skin is warm and dry.     Findings: No erythema or rash.  Neurological:     Mental Status: She is alert. Mental status is at baseline.  Cranial Nerves: No cranial nerve deficit.     Coordination: Coordination normal.  Psychiatric:        Mood and Affect: Mood normal.       CMP Latest Ref Rng & Units 09/18/2020  Glucose 70 - 99 mg/dL 247(H)  BUN 8 - 23 mg/dL 13  Creatinine 0.44 - 1.00 mg/dL 0.61  Sodium 135 - 145 mmol/L 138  Potassium 3.5 - 5.1 mmol/L 4.1  Chloride 98 - 111 mmol/L 103  CO2 22 - 32 mmol/L 25  Calcium 8.9 - 10.3 mg/dL 8.8(L)  Total Protein 6.5 - 8.1 g/dL 6.6  Total Bilirubin 0.3 - 1.2 mg/dL 0.6  Alkaline Phos 38 - 126 U/L 112  AST 15 - 41 U/L 35  ALT 0 - 44 U/L 25   CBC Latest Ref Rng & Units 12/20/2020  WBC 4.0 - 10.5 K/uL 3.7(L)  Hemoglobin 12.0 - 15.0 g/dL 12.3  Hematocrit 36.0 - 46.0 % 38.1  Platelets 150 - 400 K/uL 107(L)     LABORATORY DATA:  I have reviewed the data as listed Lab Results  Component Value Date   WBC 3.7 (L) 12/20/2020   HGB 12.3 12/20/2020   HCT 38.1 12/20/2020   MCV 84.9 12/20/2020   PLT 107 (L) 12/20/2020   Recent Labs    01/05/20 0915 08/01/20 1307 09/13/20 1023 09/18/20 1259  NA 139 140 140 138  K 4.1 3.9 4.2 4.1  CL 102 102 106 103  CO2 '27 26 26 25  ' GLUCOSE 204* 221* 129* 247*  BUN '13 14 14 13  ' CREATININE 0.67 0.58 0.58* 0.61  CALCIUM 9.2 8.7* 9.1 8.8*  GFRNONAA 84 >60 88 >60  GFRAA 98 >60 102  --   PROT 6.3  --  6.3 6.6  ALBUMIN  --   --   --  3.5  AST 40*  --  27 35  ALT 27  --  21 25  ALKPHOS  --   --   --  112  BILITOT 0.8  --  0.6 0.6   Iron/TIBC/Ferritin/ %Sat    Component Value Date/Time   IRON 67 12/20/2020 1117   TIBC 353 12/20/2020 1117   FERRITIN 37 12/20/2020 1117   IRONPCTSAT 19 12/20/2020 1117   IRONPCTSAT 6 (L) 09/13/2020 1023     RADIOGRAPHIC STUDIES: I have personally reviewed the radiological images as listed and agreed with the findings in the report. US Abdomen  Complete  Result Date: 10/18/2020 CLINICAL DATA:  Abnormal CT scan. EXAM: ABDOMEN ULTRASOUND COMPLETE COMPARISON:  June 29, 2020. FINDINGS: Gallbladder: Small gallstone or polyp is seen measuring 5 mm. No significant gallbladder wall thickening or pericholecystic fluid is noted. No sonographic Murphy's sign is noted. Common bile duct: Diameter: 6 mm which is within normal limits. Liver: Heterogeneous echotexture of hepatic parenchyma is noted with nodular contours consistent with hepatic cirrhosis. No definite focal hepatic abnormality is noted. Portal vein is patent on color Doppler imaging with normal direction of blood flow towards the liver. IVC: No abnormality visualized. Pancreas: Visualized portion unremarkable. Spleen: No focal abnormality is noted. Spleen measures 11.5 x 15.6 x 6.7 cm with calculated volume of 628 cubic cm, consistent with mild splenomegaly. Right Kidney: Length: 8.8 cm. 2.1 cm simple cyst is noted. Echogenicity within normal limits. No mass or hydronephrosis visualized. Left Kidney: Length: 13.2 cm. Echogenicity within normal limits. No mass or hydronephrosis visualized. Abdominal aorta: No aneurysm visualized. Other findings: Recanalized umbilical vein is noted suggesting portal hypertension. IMPRESSION: Findings consistent  hepatic cirrhosis and mild splenomegaly. Small gallbladder polyp or gallstone is noted. Right renal atrophy is noted. Recanalized umbilical vein is noted suggesting portal hypertension. Electronically Signed   By: Marijo Conception M.D.   On: 10/18/2020 19:59       ASSESSMENT & PLAN:  1. Iron deficiency anemia, unspecified iron deficiency anemia type   2. Splenomegaly   3. Cirrhosis of liver without ascites, unspecified hepatic cirrhosis type ([place])   4. Thrombocytopenia ([place])    # Iron deficiency anemia,  Labs reviewed and discussed with patient. Hemoglobin has normalized to 12.3.  Iron panel also has improved. No need for IV Venofer  treatments. Recommend patient to continue iron supplementation twice daily.  She tolerates well.  Etiology of the iron deficiency is to be determined.  She has met gastroenterology and is deciding colonoscopy.  #Liver cirrhosis, 10/18/2020 ultrasound findings are consistent with cirrhosis as well as splenomegaly.  Recommend patient to further discuss with gastroenterology for plans of management  #Thrombocytopenia, platelet count slightly lower today.  Probably due to liver cirrhosis/splenomegaly.  Monitor. Orders Placed This Encounter  Procedures  . Iron and TIBC    Standing Status:   Future    Standing Expiration Date:   12/22/2021  . Ferritin    Standing Status:   Future    Standing Expiration Date:   06/21/2021  . CBC with Differential/Platelet    Standing Status:   Future    Standing Expiration Date:   12/22/2021  . Comprehensive metabolic panel    Standing Status:   Future    Standing Expiration Date:   12/22/2021    All questions were answered. The patient knows to call the clinic with any problems questions or concerns.  Cc Delsa Grana, PA-C  Return of visit: 10 weeks  thank you for this kind referral and the opportunity to participate in the care of this patient. A copy of today's note is routed to referring provider   Earlie Server, MD, PhD Hematology Oncology [place] at [facility] 12/22/2020

## 2020-12-22 NOTE — Progress Notes (Signed)
Pt in for follow up, reports fell last week, hurt right arm on previous fall and is going to PT weekly.

## 2020-12-29 ENCOUNTER — Other Ambulatory Visit: Payer: Self-pay

## 2020-12-29 NOTE — Telephone Encounter (Signed)
Pt has appt this week. Will refill medication at appointment.

## 2020-12-30 ENCOUNTER — Ambulatory Visit: Payer: Self-pay | Admitting: Physician Assistant

## 2021-01-09 ENCOUNTER — Telehealth: Payer: Self-pay

## 2021-01-09 NOTE — Telephone Encounter (Signed)
Returned patients call to reschedule procedure with Dr. Maximino Greenland currently scheduled on 01/17/21.  LVM for her to call office back to reschedule or reply to [employer].  Thanks,  [place], [place]

## 2021-01-10 ENCOUNTER — Telehealth: Payer: Self-pay

## 2021-01-10 NOTE — Telephone Encounter (Signed)
Patients call has been returned.  She states that she is having home therapist come to her home for the next couple of weeks and will call back to reschedule her procedure.  However in regards to her procedures, she has asked to only have the EGD performed.  She said she has discussed with Dr. Cathie Hoops.  Dr. Cathie Hoops informed her that her iron levels are improving and that she can do the EGD without colonoscopy.  Thanks,  [place], [place]

## 2021-01-12 ENCOUNTER — Telehealth: Payer: Self-pay

## 2021-01-12 NOTE — Telephone Encounter (Signed)
Called patient to let her know that Dr. Maximino Greenland would love for her to come in. Patient agreed and she will be seen at our [place] location on 01/16/2021 at 10:00 AM.

## 2021-01-12 NOTE — Telephone Encounter (Signed)
-----   Message from Pasty Spillers, MD sent at 12/29/2020  2:53 PM EST ----- Thanks. Her EGD would allow for variceal screening in that case. Kandis Cocking, can you make sure she has follow up with me in person 2-4 weeks post procedure to discuss her cirrhosis  ----- Message ----- From: Rickard Patience, MD Sent: 12/22/2020   1:40 PM EST To: Pasty Spillers, MD  Her hemoglobin has improved after started on iron supplementation.  Thank you for seeing her for iron deficiency.  In addition she also has had image findings consistent with liver cirrhosis.  Appreciate your opinion.  I encourage patient to further discuss with you about the management plan of cirrhosis  Janyth Contes

## 2021-01-13 ENCOUNTER — Other Ambulatory Visit: Payer: Medicare PPO

## 2021-01-16 ENCOUNTER — Ambulatory Visit: Payer: Medicare PPO | Admitting: Gastroenterology

## 2021-01-17 ENCOUNTER — Ambulatory Visit: Admit: 2021-01-17 | Payer: Medicare PPO | Admitting: Gastroenterology

## 2021-01-17 SURGERY — ESOPHAGOGASTRODUODENOSCOPY (EGD) WITH PROPOFOL
Anesthesia: Choice

## 2021-01-30 ENCOUNTER — Other Ambulatory Visit: Payer: Self-pay

## 2021-01-30 ENCOUNTER — Ambulatory Visit (INDEPENDENT_AMBULATORY_CARE_PROVIDER_SITE_OTHER): Payer: Medicare PPO | Admitting: Gastroenterology

## 2021-01-30 ENCOUNTER — Encounter: Payer: Self-pay | Admitting: Gastroenterology

## 2021-01-30 VITALS — BP 120/77 | HR 79 | Temp 97.5°F | Ht 62.0 in | Wt 172.4 lb

## 2021-01-30 DIAGNOSIS — K7460 Unspecified cirrhosis of liver: Secondary | ICD-10-CM | POA: Diagnosis not present

## 2021-01-30 DIAGNOSIS — D509 Iron deficiency anemia, unspecified: Secondary | ICD-10-CM

## 2021-01-30 DIAGNOSIS — I8500 Esophageal varices without bleeding: Secondary | ICD-10-CM

## 2021-01-30 NOTE — Progress Notes (Signed)
Vonda Antigua, MD [address]  [place]  [place], [place] [address]  Main: [phone]  Fax: [phone]   Primary Care Physician: Delsa Grana, PA-C   Chief Complaint  Patient presents with  . Cirrhosis    No symptoms     HPI: Whitney [name] is a 80 y.o. female here for follow-up of iron deficiency anemia and cirrhosis.  Patient was initially referred to [place] for iron deficiency anemia by Dr. Tasia Catchings.  Patient was recommended to have an EGD and colonoscopy but has not scheduled it.  She presents with her family today.  She is not interested in a colonoscopy as her anemia and iron levels are improving with iron replacement.  She does not want to take the prep.  However, she is agreeable to upper endoscopy. The patient denies abdominal or flank pain, anorexia, nausea or vomiting, dysphagia, change in bowel habits or black or bloody stools or weight loss.  Recent imaging has also reported cirrhosis of the liver.  Patient unaware of this diagnosis prior to this imaging.  Denies any history of daily or heavy alcohol use in the past.  Reports history [place] was [place].  Procedure report not available.  According to the patient colonoscopies prior to that were normal to.  Denies any family history of colon cancer.    Current Outpatient Medications  Medication Sig Dispense Refill  . ACCU-CHEK GUIDE test strip     . Accu-Chek Softclix Lancets lancets     . acetaminophen (TYLENOL) 500 MG tablet Take 500-1,000 mg by mouth every 6 (six) hours as needed (for pain.).    Marland Kitchen Alcohol Swabs (B-D SINGLE USE SWABS REGULAR) PADS     . Blood Glucose Monitoring Suppl (ACCU-CHEK GUIDE ME) w/Device KIT     . buPROPion (WELLBUTRIN XL) 150 MG 24 hr tablet TAKE 1 TABLET(150 MG) BY MOUTH DAILY 90 tablet 1  . carbidopa-levodopa (SINEMET IR) 25-100 MG tablet Take by mouth. Take 1 tablet by mouth 4 (four) times daily    . cholecalciferol (VITAMIN D3) 25 MCG  (1000 UNIT) tablet Take 1,000 Units by mouth daily.     . ferrous sulfate 325 (65 FE) MG tablet Take 325 mg by mouth daily with breakfast.     . metFORMIN (GLUCOPHAGE-XR) 500 MG 24 hr tablet Take 1 tablet (500 mg total) by mouth 2 (two) times daily with a meal. TAKE 1 TABLET(500 MG) BY MOUTH BID (Patient taking differently: Take 500 mg by mouth in the morning, at noon, and at bedtime. TAKE 1 TABLET(500 MG) BY MOUTH BID) 180 tablet 3  . mirabegron ER (MYRBETRIQ) 25 MG TB24 tablet Take 1 tablet (25 mg total) by mouth daily. 30 tablet 11  . Na Sulfate-K Sulfate-Mg Sulf 17.5-3.13-1.6 GM/177ML SOLN At 5 PM the day before procedure take 1 bottle and 5 hours before procedure take 1 bottle. 354 mL 0  . solifenacin (VESICARE) 5 MG tablet Take 1 tablet (5 mg total) by mouth daily. (Patient taking differently: Take 5 mg by mouth daily after supper.) 30 tablet 11  . vitamin B-12 (CYANOCOBALAMIN) 500 MCG tablet Take 500 mcg by mouth daily.     No current facility-administered medications for this visit.    Allergies as of 01/30/2021  . (No Known Allergies)    ROS:  General: Negative for anorexia, weight loss, fever, chills, fatigue, weakness. ENT: Negative for hoarseness, difficulty swallowing , nasal congestion. CV: Negative for chest pain, angina, palpitations,  dyspnea on exertion, peripheral edema.  Respiratory: Negative for dyspnea at rest, dyspnea on exertion, cough, sputum, wheezing.  GI: See history of present illness. GU:  Negative for dysuria, hematuria, urinary incontinence, urinary frequency, nocturnal urination.  Endo: Negative for unusual weight change.    Physical Examination:   BP 120/77 (BP Location: Left Arm, Patient Position: Sitting, Cuff Size: Normal)   Pulse 79   Temp (!) 97.5 F (36.4 C) (Oral)   Ht '5\' 2"'  (1.575 m)   Wt 172 lb 6 oz (78.2 kg)   BMI 31.53 kg/m   General: Well-nourished, well-developed in no acute distress.  Eyes: No icterus. Conjunctivae pink. Mouth:  Oropharyngeal mucosa moist and pink , no lesions erythema or exudate. Neck: Supple, Trachea midline Abdomen: Bowel sounds are normal, nontender, nondistended, no hepatosplenomegaly or masses, no abdominal bruits or hernia , no rebound or guarding.   Extremities: No lower extremity edema. No clubbing or deformities. Neuro: Alert and oriented x 3.  Grossly intact. Skin: Warm and dry, no jaundice.   Psych: Alert and cooperative, normal mood and affect.   Labs: CMP     Component Value Date/Time   NA 138 09/18/2020 1259   K 4.1 09/18/2020 1259   CL 103 09/18/2020 1259   CO2 25 09/18/2020 1259   GLUCOSE 247 (H) 09/18/2020 1259   BUN 13 09/18/2020 1259   CREATININE 0.61 09/18/2020 1259   CREATININE 0.58 (L) 09/13/2020 1023   CALCIUM 8.8 (L) 09/18/2020 1259   PROT 6.6 09/18/2020 1259   ALBUMIN 3.5 09/18/2020 1259   AST 35 09/18/2020 1259   ALT 25 09/18/2020 1259   ALKPHOS 112 09/18/2020 1259   BILITOT 0.6 09/18/2020 1259   GFRNONAA >60 09/18/2020 1259   GFRNONAA 88 09/13/2020 1023   GFRAA 102 09/13/2020 1023   Lab Results  Component Value Date   WBC 3.7 (L) 12/20/2020   HGB 12.3 12/20/2020   HCT 38.1 12/20/2020   MCV 84.9 12/20/2020   PLT 107 (L) 12/20/2020    Imaging Studies: No results found.  Assessment and Plan:   Whitney [name] is a 80 y.o. y/o female here for follow-up of iron deficiency anemia and cirrhosis  Etiology of cirrhosis unknown Possible Karlene Lineman Obtain blood work to rule out any other etiologies such as viral or autoimmune hepatitis Chronically low platelets suggest cirrhosis INR normal Normal albumin and other liver enzymes  Patient is in need of EGD for both iron deficiency anemia and variceal screening  I have discussed alternative options, risks & benefits,  which include, but are not limited to, bleeding, infection, perforation,respiratory complication & drug reaction.  The patient agrees with this plan & written consent will be obtained.    I  discussed with her that if EGD is unrevealing for cause of her iron deficiency anemia, next up would be colonoscopy.  She prefers to only proceed with EGD first and if negative, reassess need for colonoscopy  However, it appears that her new iron deficiency anemia was first noted on October 2021 labs.  She had thyroidectomy on August 03, 2020 and 500 cc of estimated blood loss is reported on the surgery note.  Hemoglobin or CBC were not done during that surgery hospitalization.  Question if anemia first noted to be in October were due to blood loss of associated to the surgery itself.  However, blood work was done 6 weeks after the surgery, and continued even a month after that, and usually would not expect that level of anemia this  far out from the surgery   Dr Vonda Antigua

## 2021-01-31 ENCOUNTER — Encounter: Payer: Self-pay | Admitting: Gastroenterology

## 2021-01-31 ENCOUNTER — Other Ambulatory Visit: Payer: Self-pay

## 2021-02-01 ENCOUNTER — Ambulatory Visit (INDEPENDENT_AMBULATORY_CARE_PROVIDER_SITE_OTHER): Payer: Medicare PPO | Admitting: Physician Assistant

## 2021-02-01 ENCOUNTER — Encounter: Payer: Self-pay | Admitting: Physician Assistant

## 2021-02-01 ENCOUNTER — Encounter: Payer: Self-pay | Admitting: Anesthesiology

## 2021-02-01 VITALS — BP 128/74 | HR 84 | Ht 62.0 in | Wt 170.0 lb

## 2021-02-01 DIAGNOSIS — N3281 Overactive bladder: Secondary | ICD-10-CM | POA: Diagnosis not present

## 2021-02-01 LAB — CERULOPLASMIN: Ceruloplasmin: 23 mg/dL (ref 19.0–39.0)

## 2021-02-01 LAB — HEPATITIS C ANTIBODY: Hep C Virus Ab: <0.1 s/co ratio (ref 0.0–0.9)

## 2021-02-01 LAB — HEPATITIS A ANTIBODY, TOTAL: hep A Total Ab: NEGATIVE

## 2021-02-01 LAB — IGG: IgG (Immunoglobin G), Serum: 1070 mg/dL (ref 586–1602)

## 2021-02-01 LAB — MITOCHONDRIAL/SMOOTH MUSCLE AB PNL
Mitochondrial Ab: <20 Units (ref 0.0–20.0)
Smooth Muscle Ab: 7 Units (ref 0–19)

## 2021-02-01 LAB — HEPATITIS B SURFACE ANTIBODY,QUALITATIVE: Hep B Surface Ab, Qual: REACTIVE

## 2021-02-01 LAB — BLADDER SCAN AMB NON-IMAGING

## 2021-02-01 LAB — HEPATITIS B CORE ANTIBODY, TOTAL: Hep B Core Total Ab: NEGATIVE

## 2021-02-01 LAB — HEPATITIS B SURFACE ANTIGEN: Hepatitis B Surface Ag: NEGATIVE

## 2021-02-01 LAB — ANTI-MICROSOMAL ANTIBODY LIVER / KIDNEY: LKM1 Ab: 1.3 Units (ref 0.0–20.0)

## 2021-02-01 LAB — ANA: ANA Titer 1: NEGATIVE

## 2021-02-01 MED ORDER — MIRABEGRON ER 50 MG PO TB24: 50.0000 mg | ORAL_TABLET | Freq: Every day | ORAL | 0 refills | Status: DC

## 2021-02-01 NOTE — Patient Instructions (Signed)
STOP solifenacin. INCREASE Myrbetriq to 50mg  daily. I gave you four weeks of samples of this medication today.

## 2021-02-01 NOTE — Progress Notes (Signed)
02/01/2021 11:26 AM   Whitney [name] 12/10/1941 [id number]  CC: Chief Complaint  Patient presents with  . Follow-up    HPI: Whitney [name] is a 80 y.o. 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  Today she reports having switched the timing of her medications to solifenacin in the morning and Myrbetriq in the evening.  This change occurred approximately 2 months ago.  She continues to report significant nocturia except for the last 2 nights, when she only woke up once to void.  Usually though, she voids every 1-2 hours overnight.  She denies daytime frequency and reports bothersome urgency with standing and urge incontinence without stress incontinence.  She has some dry mouth and dry eye at baseline and denies constipation.  Overall, she is most bothered by her nocturia.  She is not on diuretics.  She does not have a history of snoring.  She reports a sleep study around 5 years ago with no evidence of apnea at that time.  She has BLE edema at baseline and props her feet up in the afternoons.  She drinks water, 12 ounces of Pepsi, and milk daily and limits fluids after bedtime.  She does not currently use absorbent pads and reports large volume leakage when she has incontinence.  PVR 39m.  PMH: Past Medical History:  Diagnosis Date  . Anemia   . Arthritis   . Broken shoulder    left humerus   . COVID-19   . Depression   . Diabetes mellitus without complication ([place]   . Goiter   . Hyperlipidemia   . Overactive bladder   . Parkinson's disease ([place]   . Pneumonia   . Right arm weakness    limited movement  . Unsteady gait     Surgical History: Past Surgical History:  Procedure Laterality Date  . ABDOMINAL HYSTERECTOMY  1980  . BLADDER REPAIR  1980   post hysterectomy  . NECK SURGERY     anterior cervical disc fusion  . THYROIDECTOMY Left  08/03/2020   Procedure: TAlfredia Ferguson  Surgeon: BClyde Canterbury MD;  Location: [facility] ORS;  Service: ENT;  Laterality: Left;    Home Medications:  Allergies as of 02/01/2021   No Known Allergies     Medication List       Accurate as of February 01, 2021 11:26 AM. If you have any questions, ask your nurse or doctor.        Accu-Chek Guide Me w/Device Kit   Accu-Chek Guide test strip Generic drug: glucose blood   Accu-Chek Softclix Lancets lancets   acetaminophen 500 MG tablet Commonly known as: TYLENOL Take 500-1,000 mg by mouth every 6 (six) hours as needed (for pain.).   B-D SINGLE USE SWABS REGULAR Pads   buPROPion 150 MG 24 hr tablet Commonly known as: WELLBUTRIN XL TAKE 1 TABLET(150 MG) BY MOUTH DAILY   carbidopa-levodopa 25-100 MG tablet Commonly known as: SINEMET IR Take by mouth. Take 1 tablet by mouth 4 (four) times daily   cholecalciferol 25 MCG (1000 UNIT) tablet Commonly known as: VITAMIN D3 Take 1,000 Units by mouth daily.   ferrous sulfate 325 (65 FE) MG tablet Take 325 mg by mouth daily with breakfast.   metFORMIN 500 MG 24 hr tablet Commonly known as: GLUCOPHAGE-XR Take 1 tablet (500 mg total) by mouth 2 (two) times daily with a meal. TAKE 1 TABLET(500 MG) BY  MOUTH BID What changed: when to take this   mirabegron ER 25 MG Tb24 tablet Commonly known as: Myrbetriq Take 1 tablet (25 mg total) by mouth daily.   Na Sulfate-K Sulfate-Mg Sulf 17.5-3.13-1.6 GM/177ML Soln At 5 PM the day before procedure take 1 bottle and 5 hours before procedure take 1 bottle.   solifenacin 5 MG tablet Commonly known as: VESICARE Take 1 tablet (5 mg total) by mouth daily. What changed: when to take this   vitamin B-12 500 MCG tablet Commonly known as: CYANOCOBALAMIN Take 500 mcg by mouth daily.       Allergies:  No Known Allergies  Family History: Family History  Problem Relation Age of Onset  . Stroke Mother   . Hypertension Mother   . Diabetes  Father   . Heart disease Father   . Lung cancer Father   . Colon cancer Sister   . Diabetes Sister   . Kidney disease Daughter   . Kidney cancer Daughter   . Pulmonary embolism Sister   . Diabetes Sister   . Kidney disease Sister   . Heart disease Sister   . Atrial fibrillation Sister   . Diabetes Brother   . Heart disease Brother   . AAA (abdominal aortic aneurysm) Brother   . Cancer Brother        skin    Social History:   reports that she quit smoking about 2 years ago. Her smoking use included cigarettes. She has a 80.00 pack-year smoking history. She has never used smokeless tobacco. She reports previous alcohol use. She reports that she does not use drugs.  Physical Exam: BP 128/74   Pulse 84   Ht 5' 2" (1.575 m)   Wt 170 lb (77.1 kg)   BMI 31.09 kg/m   Constitutional:  Alert and oriented, no acute distress, nontoxic appearing HEENT: , AT Cardiovascular: No clubbing, cyanosis, or edema Respiratory: Normal respiratory effort, no increased work of breathing Skin: No rashes, bruises or suspicious lesions Neurologic: Grossly intact, no focal deficits, moving all 4 extremities Psychiatric: Normal mood and affect  Laboratory Data: Results for orders placed or performed in visit on 02/01/21  Bladder Scan (Post Void Residual) in office  Result Value Ref Range   Scan Result 0mL    Assessment & Plan:   1. Overactive bladder  Urgency, urge incontinence, and nocturia on combination solifenacin and Myrbetriq.  She reports a dry mouth and dry eye at baseline, unclear if this represents side effect of anticholinergics.  I explained to the patient and her daughter today that long-term use of anticholinergics increases the risk for cognitive impairment in elderly patients.  I recommended stopping solifenacin and increasing Myrbetriq to 50 mg daily.  They are in agreement with this plan.  Patient prefers a virtual visit for follow-up in 1 month, I am in agreement with this plan  given that she is emptying well today.  We discussed alternative OAB therapies today including PTNS, intravesical Botox, and InterStim today.  I explained that we may consider these in the future if maximum Myrbetriq therapy fails to improve her nocturia.  I counseled her to continue propping up her legs at night and decreasing fluid intake after dinner.  May consider Gemtesa as an alternative pharmacotherapy in the future. - Bladder Scan (Post Void Residual) in office - mirabegron ER (MYRBETRIQ) 50 MG TB24 tablet; Take 1 tablet (50 mg total) by mouth daily.  Dispense: 28 tablet; Refill: 0   Return in about 4   weeks (around 03/01/2021) for Virtual visit for symptom recheck.  Debroah Loop, PA-C  [facility] [address], [place], [place] [address] [phone]

## 2021-02-02 ENCOUNTER — Other Ambulatory Visit: Payer: Medicare PPO

## 2021-02-06 ENCOUNTER — Ambulatory Visit: Admission: RE | Admit: 2021-02-06 | Payer: Medicare PPO | Source: Home / Self Care | Admitting: Gastroenterology

## 2021-02-06 ENCOUNTER — Telehealth: Payer: Self-pay | Admitting: Family Medicine

## 2021-02-06 HISTORY — DX: Anemia, unspecified: D64.9

## 2021-02-06 HISTORY — DX: Other symptoms and signs involving the musculoskeletal system: R29.898

## 2021-02-06 SURGERY — ESOPHAGOGASTRODUODENOSCOPY (EGD) WITH PROPOFOL
Anesthesia: Choice

## 2021-02-06 NOTE — Telephone Encounter (Signed)
Please counsel the patient that it may take a couple of weeks of continuous use of the increased dose of Myrbetriq for her to see an improvement in her urinary symptoms.  She should continue on the medication and keep her scheduled follow-up with me as planned.

## 2021-02-06 NOTE — Telephone Encounter (Signed)
Patient left message and states the Myrbetriq 50mg  is not working. She states she is getting up several times at night and is unable to make it to the restroom. Do you have any suggestions on another medication?

## 2021-02-08 NOTE — Telephone Encounter (Signed)
Notified patient as advised, patient expressed understanding and confirmed next appt.

## 2021-02-15 ENCOUNTER — Other Ambulatory Visit: Payer: Self-pay

## 2021-02-15 ENCOUNTER — Emergency Department: Payer: Medicare PPO

## 2021-02-15 ENCOUNTER — Emergency Department
Admission: EM | Admit: 2021-02-15 | Discharge: 2021-02-15 | Disposition: A | Discharge status: Home / Self Care | Payer: Medicare PPO | Attending: Emergency Medicine | Admitting: Emergency Medicine

## 2021-02-15 ENCOUNTER — Ambulatory Visit: Payer: Self-pay | Admitting: *Deleted

## 2021-02-15 DIAGNOSIS — W19XXXA Unspecified fall, initial encounter: Secondary | ICD-10-CM

## 2021-02-15 DIAGNOSIS — Z8616 Personal history of COVID-19: Secondary | ICD-10-CM | POA: Diagnosis not present

## 2021-02-15 DIAGNOSIS — Z7984 Long term (current) use of oral hypoglycemic drugs: Secondary | ICD-10-CM | POA: Insufficient documentation

## 2021-02-15 DIAGNOSIS — E119 Type 2 diabetes mellitus without complications: Secondary | ICD-10-CM | POA: Insufficient documentation

## 2021-02-15 DIAGNOSIS — Z87891 Personal history of nicotine dependence: Secondary | ICD-10-CM | POA: Diagnosis not present

## 2021-02-15 DIAGNOSIS — S0990XA Unspecified injury of head, initial encounter: Secondary | ICD-10-CM | POA: Diagnosis not present

## 2021-02-15 DIAGNOSIS — S20211A Contusion of right front wall of thorax, initial encounter: Secondary | ICD-10-CM | POA: Insufficient documentation

## 2021-02-15 DIAGNOSIS — S299XXA Unspecified injury of thorax, initial encounter: Secondary | ICD-10-CM | POA: Diagnosis present

## 2021-02-15 DIAGNOSIS — M545 Low back pain, unspecified: Secondary | ICD-10-CM | POA: Insufficient documentation

## 2021-02-15 DIAGNOSIS — W07XXXA Fall from chair, initial encounter: Secondary | ICD-10-CM | POA: Diagnosis not present

## 2021-02-15 DIAGNOSIS — T07XXXA Unspecified multiple injuries, initial encounter: Secondary | ICD-10-CM

## 2021-02-15 MED ORDER — HYDROCODONE-ACETAMINOPHEN 5-325 MG PO TABS
1.0000 | ORAL_TABLET | Freq: Once | ORAL | Status: AC
  Administered 2021-02-15: 1 via ORAL
  Filled 2021-02-15: qty 1

## 2021-02-15 MED ORDER — HYDROCODONE-ACETAMINOPHEN 5-325 MG PO TABS: 1.0000 | ORAL_TABLET | Freq: Four times a day (QID) | ORAL | 0 refills | Status: DC | PRN

## 2021-02-15 NOTE — ED Triage Notes (Signed)
Pt states she had been sitting in her chair on her phone and when she went to get up and slid down in to the floor. Pt c/o right rib pain, in NAD at present.

## 2021-02-15 NOTE — Telephone Encounter (Signed)
Pt called stating she fell last night and now it hurts when she takes a deep breath; the affected area is under her right breast; she rates her pain at 8 out of 10 when she breaths or bends over; the pt slid out of her chair when she was standing up; she fell backwards and hit the kitchen floor; she also has a knot on the right side of the back of her head; recommendations made per nurse triage protocol; the pt is seen by Danelle Berry at [facility]; spoke with Efraim Kaufmann and here is no availability in the office today; pt advised to be evaluated at Urgent Care or ED; she verbalized understanding. Reason for Disposition . [1] Can't take a deep breath BUT [2] no respiratory distress  Answer Assessment - Initial Assessment Questions 1. MECHANISM: "How did the injury happen?"     Pt slid out of chair and landed on kitchen floor 2. ONSET: "When did the injury happen?" (Minutes or hours ago)    02/14/21 around 1800 3. LOCATION: "Where on the chest is the injury located?"     Under right breast 4. APPEARANCE: "What does the injury look like?"     No bruising, bleeding, or breaks in skin 5. BLEEDING: "Is there any bleeding now? If Yes, ask: How long has it been bleeding?"   no 6. SEVERITY: "Any difficulty with breathing?"    Yes, ain with 7. SIZE: For cuts, bruises, or swelling, ask: "How large is it?" (e.g., inches or centimeters)     Left side of stomach; quarter sized 8. PAIN: "Is there pain?" If Yes, ask: "How bad is the pain?"   (e.g., Scale 1-10; or mild, moderate, severe)     8 out of 10 with deep inspiration 9. TETANUS: For any breaks in the skin, ask: "When was the last tetanus booster?"     n/a 10. PREGNANCY: "Is there any chance you are pregnant?" "When was your last menstrual period?"      no  Protocols used: CHEST INJURY-A-AH

## 2021-02-15 NOTE — ED Provider Notes (Signed)
[facility] Emergency Department Provider Note  ____________________________________________   Event Date/Time   First MD Initiated Contact with Patient 02/15/21 1135     (approximate)  I have reviewed the triage vital signs and the nursing notes.   HISTORY  Chief Complaint Fall    HPI Whitney [name] is a 80 y.o. female presents emergency department stating that she has a tall chair that she was sitting in and slid to the floor hitting her lower back, right ribs and back of her head.  No LOC.  This happened last night.  She states it hurts in the right ribs and some difficulty breathing as she cannot take a deep breath.  No abdominal pain.  No cardiac type chest pain.    Past Medical History:  Diagnosis Date  . Anemia   . Arthritis   . Broken shoulder    left humerus   . COVID-19   . Depression   . Diabetes mellitus without complication ([place])   . Goiter   . Hyperlipidemia   . Overactive bladder   . Parkinson's disease ([place])   . Pneumonia   . Right arm weakness    limited movement  . Unsteady gait     Patient Active Problem List   Diagnosis Date Noted  . 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 Impaired mobility and ADLs 04/15/2020  . Vitamin D deficiency 07/20/2019  . 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 At high risk for falls 07/20/2019  . Parkinson disease ([place]) 01/15/2019  . Hyperlipidemia 01/15/2019  . Diabetes mellitus type 2 in obese ([place]) 01/13/2019  . Overactive bladder 01/13/2019  . Obesity (BMI 30.0-34.9) 01/13/2019    Past Surgical History:  Procedure Laterality Date  . ABDOMINAL HYSTERECTOMY  1980  . BLADDER REPAIR  1980   post hysterectomy  . NECK SURGERY     anterior cervical disc fusion  . THYROIDECTOMY Left 08/03/2020   Procedure: Alfredia Ferguson;  Surgeon: Clyde Canterbury, MD;  Location: [facility] ORS;  Service: ENT;  Laterality: Left;    Prior to Admission medications   Medication Sig Start  Date End Date Taking? Authorizing Provider  HYDROcodone-acetaminophen (NORCO/VICODIN) 5-325 MG tablet Take 1 tablet by mouth every 6 (six) hours as needed for moderate pain. 02/15/21  Yes Versie Starks, PA-C  ACCU-CHEK GUIDE test strip  04/27/20   [provider]  Accu-Chek Softclix Lancets lancets  04/27/20   [provider]  acetaminophen (TYLENOL) 500 MG tablet Take 500-1,000 mg by mouth every 6 (six) hours as needed (for pain.).    [provider]  Alcohol Swabs (B-D SINGLE USE SWABS REGULAR) PADS  04/27/20   [provider]  Blood Glucose Monitoring Suppl (ACCU-CHEK GUIDE ME) w/Device KIT  04/27/20   [provider]  buPROPion (WELLBUTRIN XL) 150 MG 24 hr tablet TAKE 1 TABLET(150 MG) BY MOUTH DAILY 04/01/20   Delsa Grana, PA-C  carbidopa-levodopa (SINEMET IR) 25-100 MG tablet Take by mouth. Take 1 tablet by mouth 4 (four) times daily 09/28/20 09/28/21  [provider]  cholecalciferol (VITAMIN D3) 25 MCG (1000 UNIT) tablet Take 1,000 Units by mouth daily.     [provider]  ferrous sulfate 325 (65 FE) MG tablet Take 325 mg by mouth daily with breakfast.     [provider]  metFORMIN (GLUCOPHAGE-XR) 500 MG 24 hr tablet Take 1 tablet (500 mg total) by mouth 2 (two) times daily with a meal. TAKE 1 TABLET(500 MG) BY  MOUTH BID Patient taking differently: Take 500 mg by mouth in the morning, at noon, and at bedtime. TAKE 1 TABLET(500 MG) BY MOUTH BID 09/01/20   Delsa Grana, PA-C  mirabegron ER (MYRBETRIQ) 50 MG TB24 tablet Take 1 tablet (50 mg total) by mouth daily. 02/01/21   Vaillancourt, Samantha, PA-C  Na Sulfate-K Sulfate-Mg Sulf 17.5-3.13-1.6 GM/177ML SOLN At 5 PM the day before procedure take 1 bottle and 5 hours before procedure take 1 bottle. 12/14/20   Virgel Manifold, MD  vitamin B-12 (CYANOCOBALAMIN) 500 MCG tablet Take 500 mcg by mouth daily.    [provider]    Allergies Patient has no known  allergies.  Family History  Problem Relation Age of Onset  . Stroke Mother   . Hypertension Mother   . Diabetes Father   . Heart disease Father   . Lung cancer Father   . Colon cancer Sister   . Diabetes Sister   . Kidney disease Daughter   . Kidney cancer Daughter   . Pulmonary embolism Sister   . Diabetes Sister   . Kidney disease Sister   . Heart disease Sister   . Atrial fibrillation Sister   . Diabetes Brother   . Heart disease Brother   . AAA (abdominal aortic aneurysm) Brother   . Cancer Brother        skin    Social History Social History   Tobacco Use  . Smoking status: Former Smoker    Packs/day: 2.00    Years: 40.00    Pack years: 80.00    Types: Cigarettes    Quit date: 12/10/2018    Years since quitting: 2.1  . Smokeless tobacco: Never Used  Vaping Use  . Vaping Use: Never used  Substance Use Topics  . Alcohol use: Not Currently  . Drug use: Never    Review of Systems  Constitutional: No fever/chills Eyes: No visual changes. ENT: No sore throat. Respiratory: Denies cough, positive for right rib pain Cardiovascular: Denies chest pain Gastrointestinal: Denies abdominal pain Genitourinary: Negative for dysuria. Musculoskeletal: Negative for back pain.  Positive neck pain Skin: Negative for rash. Psychiatric: no mood changes,     ____________________________________________   PHYSICAL EXAM:  VITAL SIGNS: ED Triage Vitals  Enc Vitals Group     BP 02/15/21 1108 112/66     Pulse Rate 02/15/21 1108 89     Resp 02/15/21 1108 18     Temp 02/15/21 1108 98.3 F (36.8 C)     Temp Source 02/15/21 1108 Oral     SpO2 02/15/21 1108 97 %     Weight 02/15/21 1106 170 lb (77.1 kg)     Height 02/15/21 1106 '5\' 2"'  (1.575 m)     Head Circumference --      Peak Flow --      Pain Score 02/15/21 1106 8     Pain Loc --      Pain Edu? --      Excl. in [place]? --     Constitutional: Alert and oriented. Well appearing and in no acute distress. Eyes:  Conjunctivae are normal.  Head: Small lump noted on the posterior scalp on the right side Nose: No congestion/rhinnorhea. Mouth/Throat: Mucous membranes are moist.   Neck:  supple no lymphadenopathy noted Cardiovascular: Normal rate, regular rhythm. Heart sounds are normal Respiratory: Normal respiratory effort.  No retractions, lungs c t a, right ribs tender medially and anteriorly Abd: soft nontender bs normal all 4 quad GU: deferred  Musculoskeletal: FROM all extremities, warm and well perfused Neurologic:  Normal speech and language.  Skin:  Skin is warm, dry and intact. No rash noted. Psychiatric: Mood and affect are normal. Speech and behavior are normal.  ____________________________________________   LABS (all labs ordered are listed, but only abnormal results are displayed)  Labs Reviewed - No data to display ____________________________________________   ____________________________________________  RADIOLOGY  CT of the head, C-spine, and chest  ____________________________________________   PROCEDURES  Procedure(s) performed: No  Procedures    ____________________________________________   INITIAL IMPRESSION / ASSESSMENT AND PLAN / ED COURSE  Pertinent labs & imaging results that were available during my care of the patient were reviewed by me and considered in my medical decision making (see chart for details).   Patient is a 80 year old female presents for fall.  See HPI.  Physical exam shows patient to be uncomfortable.  Tender along the right ribs, C-spine, and posterior scalp  DDx: Right rib fracture, cervical spine fracture, subdural hematoma, subarachnoid, scalp hematoma  CT of the head, C-spine, and chest without contrast   Imaging reviewed by me and confirmed by radiology to be normal.  Patient had relief with the Vicodin she was given here.  Prescription for Vicodin.  Patient is to take it sparingly.  Did warn of more frequent falls with  pain medication.  She and her daughter both state they understand.  She is to return if worsening.  She discharged stable condition.  Earlie Arciga was evaluated in Emergency Department on 02/15/2021 for the symptoms described in the history of present illness. She was evaluated in the context of the global COVID-19 pandemic, which necessitated consideration that the patient might be at risk for infection with the SARS-CoV-2 virus that causes COVID-19. Institutional protocols and algorithms that pertain to the evaluation of patients at risk for COVID-19 are in a state of rapid change based on information released by regulatory bodies including the CDC and federal and state organizations. These policies and algorithms were followed during the patient's care in the ED.    As part of my medical decision making, I reviewed the following data within the [place] History obtained from family, Nursing notes reviewed and incorporated, Old chart reviewed, Radiograph reviewed , Notes from prior ED visits and [place] Controlled Substance Database  ____________________________________________   FINAL CLINICAL IMPRESSION(S) / ED DIAGNOSES  Final diagnoses:  Fall, initial encounter  Multiple contusions  Minor head injury, initial encounter      NEW MEDICATIONS STARTED DURING THIS VISIT:  Discharge Medication List as of 02/15/2021  1:22 PM    START taking these medications   Details  HYDROcodone-acetaminophen (NORCO/VICODIN) 5-325 MG tablet Take 1 tablet by mouth every 6 (six) hours as needed for moderate pain., Starting Wed 02/15/2021, Normal         Note:  This document was prepared using Dragon voice recognition software and may include unintentional dictation errors.    Versie Starks, PA-C 02/15/21 1451    Blake Divine, MD 02/15/21 [phone]

## 2021-02-15 NOTE — Discharge Instructions (Signed)
Follow-up with your regular doctor.  Return emergency department worsening.  Take pain medication as prescribed.

## 2021-02-15 NOTE — Telephone Encounter (Signed)
FYI

## 2021-02-20 ENCOUNTER — Ambulatory Visit: Payer: Medicare PPO | Admitting: Gastroenterology

## 2021-02-28 ENCOUNTER — Other Ambulatory Visit: Payer: Self-pay

## 2021-02-28 ENCOUNTER — Telehealth (INDEPENDENT_AMBULATORY_CARE_PROVIDER_SITE_OTHER): Payer: Medicare PPO | Admitting: Physician Assistant

## 2021-02-28 DIAGNOSIS — N3281 Overactive bladder: Secondary | ICD-10-CM

## 2021-02-28 MED ORDER — GEMTESA 75 MG PO TABS: 75.0000 mg | ORAL_TABLET | Freq: Every day | ORAL | 0 refills | Status: DC

## 2021-02-28 NOTE — Progress Notes (Signed)
Virtual Visit via Telephone Note  I connected with Whitney [name] on 02/28/21 at 11:30 AM EDT by telephone and verified that I am speaking with the correct person using two identifiers.  Location: Patient: Home in [place], [place] Provider: Clinic in [place], [place]   I discussed the limitations, risks, security and privacy concerns of performing an evaluation and management service by telephone and the availability of in person appointments. I also discussed with the patient that there may be a patient responsible charge related to this service. The patient expressed understanding and agreed to proceed.  History of Present Illness: Whitney [name] is a 80 y.o. female with PMH OAB wet on Myrbetriq 50 mg, Parkinson's disease, and diabetes with peripheral neuropathy who presents today for symptom recheck on Myrbetriq.  She was previously on solifenacin 5 mg and Myrbetriq 25 mg, however we discontinued anticholinergics and increase Myrbetriq at her last visit.  Today she reports improved nocturia, now every 2 hours.  She states she has been having increased daytime frequency, every 3-4 hours.  She continues to have episodes of urge incontinence, especially overnight.  Overall, she states her urinary symptoms are slightly improved on the medication changes as above, however they remain bothersome.  Notably, she reports she no longer drives and has difficulty coming to clinic.   Observations/Objective: No apparent distress, asking appropriate questions.  Assessment and Plan: 1. Overactive bladder Slight symptomatic improvement after stopping solifenacin and increasing Myrbetriq.  Patient remains bothered by her urinary symptoms.  We discussed treatment options at this point including a trial of Gemtesa, PTNS, intravesical Botox, and InterStim.  Patient is not interested in pursuing InterStim or intravesical Botox and reports difficulty getting to the office due to transportation limitations.  Based on this,  she prefers to pursue pharmacotherapy and would like to try [facility].  I am providing her with 4 weeks of Gemtesa samples for her to pick up at her earliest convenience.  We will plan for symptom recheck in 4 weeks. - Vibegron (GEMTESA) 75 MG TABS; Take 75 mg by mouth daily.  Dispense: 28 tablet; Refill: 0   Follow Up Instructions: Return in about 4 weeks (around 03/28/2021) for Symptom recheck virtual visit.    I discussed the assessment and treatment plan with the patient. The patient was provided an opportunity to ask questions and all were answered. The patient agreed with the plan and demonstrated an understanding of the instructions.   The patient was advised to call back or seek an in-person evaluation if the symptoms worsen or if the condition fails to improve as anticipated.  I provided 16 minutes of non-face-to-face time during this encounter.  Carman Ching, PA-C

## 2021-03-01 ENCOUNTER — Telehealth: Payer: Self-pay | Admitting: Physician Assistant

## 2021-03-16 ENCOUNTER — Ambulatory Visit: Payer: Medicare PPO | Admitting: Family Medicine

## 2021-03-16 DIAGNOSIS — D649 Anemia, unspecified: Secondary | ICD-10-CM

## 2021-03-16 DIAGNOSIS — E1169 Type 2 diabetes mellitus with other specified complication: Secondary | ICD-10-CM

## 2021-03-16 DIAGNOSIS — E782 Mixed hyperlipidemia: Secondary | ICD-10-CM

## 2021-03-16 DIAGNOSIS — F320 Major depressive disorder, single episode, mild: Secondary | ICD-10-CM

## 2021-03-16 DIAGNOSIS — Z5181 Encounter for therapeutic drug level monitoring: Secondary | ICD-10-CM

## 2021-03-16 DIAGNOSIS — G20 Parkinson's disease: Secondary | ICD-10-CM

## 2021-03-28 ENCOUNTER — Other Ambulatory Visit: Payer: Self-pay

## 2021-03-28 ENCOUNTER — Other Ambulatory Visit: Payer: Self-pay | Admitting: Physician Assistant

## 2021-03-28 ENCOUNTER — Telehealth (INDEPENDENT_AMBULATORY_CARE_PROVIDER_SITE_OTHER): Payer: Medicare PPO | Admitting: Physician Assistant

## 2021-03-28 DIAGNOSIS — N3281 Overactive bladder: Secondary | ICD-10-CM

## 2021-03-28 MED ORDER — TROSPIUM CHLORIDE ER 60 MG PO CP24: 1.0000 | ORAL_CAPSULE | Freq: Every day | ORAL | 0 refills | Status: DC

## 2021-03-28 MED ORDER — MIRABEGRON ER 50 MG PO TB24: 50.0000 mg | ORAL_TABLET | Freq: Every day | ORAL | 0 refills | Status: DC

## 2021-03-28 NOTE — Progress Notes (Signed)
Virtual Visit via Telephone Note  I connected with Whitney [name] on 03/28/21 at 11:30 AM EDT by telephone and verified that I am speaking with the correct person using two identifiers.  Location: Patient: Home in [place], [place] Provider: Clinic in [place], [place]   I discussed the limitations, risks, security and privacy concerns of performing an evaluation and management service by telephone and the availability of in person appointments. I also discussed with the patient that there may be a patient responsible charge related to this service. The patient expressed understanding and agreed to proceed.  History of Present Illness: Whitney [name] is a 80 y.o. female with PMH OAB wet, Parkinson's disease, and diabetes with peripheral neuropathy who presents today for symptom recheck on Gemtesa.  She previously reported an adequate symptomatic improvement on Myrbetriq 50 mg daily after changing over from Myrbetriq 25 mg and Solifenacin 5 mg daily.  Today she reports no symptomatic improvement on Gemtesa with possible worsening urinary leakage overnight.   Over all the drug regimens we have tried, she reports that the combined Myrbetriq 25 mg and Solifenacin 5 mg daily regimen managed her frequency, nocturia, and urge incontinence the best, however even this regimen lost efficacy over time.  She does believe the increased dose of Myrbetriq 50 mg was more helpful than the 25 mg.  She remains disinterested in pursuing third line OAB treatments including InterStim and intravesical Botox.  She continues to report difficulty with transportation, which is rendered PTNS infeasible.   Observations/Objective: Asking appropriate questions, no apparent distress  Assessment and Plan: 1. Overactive bladder We again discussed that anticholinergic medications are associated with side effects including constipation, dry eye, and dry mouth, and long-term use is associated with increased risk of cognitive deficit and  dementia.  She remains appropriately concerned about these risks and is hesitant to resume Solifenacin at this time.  I explained the trospium as an anticholinergic medication that does not cross the blood-brain barrier and is thus felt to be the safest option in this drug class for management of OAB.  I suggested that we try a combination of trospium ER 60 mg and Myrbetriq 50 mg daily.  Patient is in agreement with this plan.  I prescribed both of these medications and will plan for symptom recheck and PVR in clinic in 1 month. - mirabegron ER (MYRBETRIQ) 50 MG TB24 tablet; Take 1 tablet (50 mg total) by mouth daily.  Dispense: 30 tablet; Refill: 0   Follow Up Instructions: Return in about 4 weeks (around 04/25/2021) for Symptom recheck with PVR.    I discussed the assessment and treatment plan with the patient. The patient was provided an opportunity to ask questions and all were answered. The patient agreed with the plan and demonstrated an understanding of the instructions.   The patient was advised to call back or seek an in-person evaluation if the symptoms worsen or if the condition fails to improve as anticipated.  I provided 12 minutes of non-face-to-face time during this encounter.  Carman Ching, PA-C

## 2021-03-30 ENCOUNTER — Telehealth: Payer: Self-pay | Admitting: Physician Assistant

## 2021-04-25 ENCOUNTER — Ambulatory Visit: Payer: Medicare PPO | Admitting: Physician Assistant

## 2021-04-28 ENCOUNTER — Encounter: Payer: Self-pay | Admitting: Physician Assistant

## 2021-05-05 ENCOUNTER — Other Ambulatory Visit: Payer: Self-pay | Admitting: Physician Assistant

## 2021-05-05 DIAGNOSIS — N3281 Overactive bladder: Secondary | ICD-10-CM

## 2021-05-05 NOTE — Telephone Encounter (Signed)
Per Sam: Please schedule her for sx recheck and PVR in office ASAP, need to make sure she's emptying on combination trospium + Myrbetriq.        LMOM advising patient to please return call to get appt scheduled.

## 2021-05-30 ENCOUNTER — Ambulatory Visit: Payer: Medicare PPO

## 2021-06-13 ENCOUNTER — Telehealth: Payer: Self-pay | Admitting: *Deleted

## 2021-06-13 NOTE — Telephone Encounter (Signed)
Pt calling stating that they myrbetriq is not working that she's getting up every 2 hours urinating. Pt states she doesn't drink anything after 6pm. I asked pt was she taking the trospium and myrbetriq together per telephone note 05/02/2021? Pt states she now lives at [facility] and doesn't remember the 2nd rx but will have someone go back to her home and get and start. Please advise        11:02 AM Note Per Sam: Please schedule her for sx recheck and PVR in office ASAP, need to make sure she's emptying on combination trospium + Myrbetriq.           LMOM advising patient to please return call to get appt scheduled.

## 2021-06-13 NOTE — Telephone Encounter (Signed)
Attempted to reach patient again, [facility] however answering machine cut me off half way through saying memory full so unsure how much of message patient received. Needs to be scheduled for appt below.

## 2021-06-13 NOTE — Telephone Encounter (Signed)
She should be taking combination of trospium and Myrbetriq.  Once she is back on both these agents, would like her to follow-up with me in 1 month for symptom recheck and PVR.

## 2021-06-20 ENCOUNTER — Inpatient Hospital Stay: Payer: Medicare PPO | Attending: Oncology

## 2021-06-20 ENCOUNTER — Other Ambulatory Visit: Payer: Self-pay | Admitting: Physician Assistant

## 2021-06-20 DIAGNOSIS — N3281 Overactive bladder: Secondary | ICD-10-CM

## 2021-06-20 NOTE — Telephone Encounter (Signed)
Scheduled 1 mo sx recheck w PVR for patient. Explained to patient she will have to come into office for PVR on combo Myrbetriq/Trospium. Patient notes she does have transportation issues however she confirmed her appt date and time.

## 2021-06-20 NOTE — Telephone Encounter (Signed)
Ok to refill trospium once follow-up appt for sx recheck and PVR on combo Myrbetriq/trospium is scheduled.

## 2021-06-21 ENCOUNTER — Telehealth: Payer: Self-pay | Admitting: Oncology

## 2021-06-21 NOTE — Telephone Encounter (Signed)
Patient did not come for labs on 7/12, called to reschedule her 7/14 md appt and she is confused and unsure of why she has a f/u appointment.  Patient is requesting a call form clinical team to discuss.

## 2021-06-22 ENCOUNTER — Emergency Department: Payer: Medicare PPO

## 2021-06-22 ENCOUNTER — Emergency Department
Admission: EM | Admit: 2021-06-22 | Discharge: 2021-06-22 | Disposition: A | Discharge status: Home / Self Care | Payer: Medicare PPO | Attending: Emergency Medicine | Admitting: Emergency Medicine

## 2021-06-22 ENCOUNTER — Encounter: Payer: Self-pay | Admitting: Emergency Medicine

## 2021-06-22 ENCOUNTER — Other Ambulatory Visit: Payer: Self-pay

## 2021-06-22 ENCOUNTER — Inpatient Hospital Stay: Payer: Medicare PPO | Admitting: Oncology

## 2021-06-22 DIAGNOSIS — Z8616 Personal history of COVID-19: Secondary | ICD-10-CM | POA: Diagnosis not present

## 2021-06-22 DIAGNOSIS — W19XXXA Unspecified fall, initial encounter: Secondary | ICD-10-CM | POA: Diagnosis not present

## 2021-06-22 DIAGNOSIS — E119 Type 2 diabetes mellitus without complications: Secondary | ICD-10-CM | POA: Diagnosis not present

## 2021-06-22 DIAGNOSIS — G20 Parkinson's disease: Secondary | ICD-10-CM | POA: Diagnosis not present

## 2021-06-22 DIAGNOSIS — Z7984 Long term (current) use of oral hypoglycemic drugs: Secondary | ICD-10-CM | POA: Insufficient documentation

## 2021-06-22 DIAGNOSIS — Z87891 Personal history of nicotine dependence: Secondary | ICD-10-CM | POA: Diagnosis not present

## 2021-06-22 DIAGNOSIS — S4991XA Unspecified injury of right shoulder and upper arm, initial encounter: Secondary | ICD-10-CM

## 2021-06-22 MED ORDER — TRAMADOL HCL 50 MG PO TABS
50.0000 mg | ORAL_TABLET | Freq: Once | ORAL | Status: AC
  Administered 2021-06-22: 50 mg via ORAL
  Filled 2021-06-22: qty 1

## 2021-06-22 NOTE — ED Provider Notes (Signed)
[facility] Emergency Department Provider Note ____________________________________________   Event Date/Time   First MD Initiated Contact with Patient 06/22/21 1541     (approximate)  I have reviewed the triage vital signs and the nursing notes.   HISTORY  Chief Complaint Fall  HPI Whitney [name] is a 80 y.o. female with history of Parkinson's, diabetes, and remaining history as listed below presents to the emergency department for treatment and evaluation of right arm pain after a fall 3 days ago.  She denies striking her head or experiencing any loss of consciousness.  She has pain in the right upper arm.  No relief with Tylenol.          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[place]) 07/20/2019   At high risk for falls 07/20/2019   Parkinson disease ([employer]) 01/15/2019   Hyperlipidemia 01/15/2019   Diabetes mellitus type 2 in obese ([place]) 01/13/2019   Overactive bladder 01/13/2019   Obesity (BMI 30.0-34.9) 01/13/2019    Past Surgical History:  Procedure Laterality Date   [place]   post hysterectomy   NECK SURGERY     anterior cervical disc fusion   THYROIDECTOMY Left 08/03/2020   Procedure: THYROIDECTOMY-HEMI;  Surgeon: Clyde Canterbury, MD;  Location: [facility] ORS;  Service: ENT;  Laterality: Left;    Prior to Admission medications   Medication Sig Start Date End Date Taking? Authorizing Provider  ACCU-CHEK  GUIDE test strip  04/27/20   [provider]  Accu-Chek Softclix Lancets lancets  04/27/20   [provider]  acetaminophen (TYLENOL) 500 MG tablet Take 500-1,000 mg by mouth every 6 (six) hours as needed (for pain.).    [provider]  Alcohol Swabs (B-D SINGLE USE SWABS REGULAR) PADS  04/27/20   [provider]  Blood Glucose Monitoring Suppl (ACCU-CHEK GUIDE ME) w/Device KIT  04/27/20   [provider]  buPROPion (WELLBUTRIN XL) 150 MG 24 hr tablet TAKE 1 TABLET(150 MG) BY MOUTH DAILY 04/01/20   Delsa Grana, PA-C  carbidopa-levodopa (SINEMET IR) 25-100 MG tablet Take by mouth. Take 1 tablet by mouth 4 (four) times daily 09/28/20 09/28/21  [provider]  cholecalciferol (VITAMIN D3) 25 MCG (1000 UNIT) tablet Take 1,000 Units by mouth daily.     [provider]  ferrous sulfate 325 (65 FE) MG tablet Take 325 mg by mouth daily with breakfast.     [provider]  meloxicam (MOBIC) 7.5 MG tablet Take by mouth. 03/16/21 03/16/22  [provider]  metFORMIN (GLUCOPHAGE-XR) 500 MG 24 hr tablet Take 1 tablet (500 mg total) by mouth 2 (two) times daily with a meal. TAKE 1 TABLET(500 MG) BY MOUTH BID Patient taking differently: Take 500 mg by mouth in the morning, at noon, and at bedtime. TAKE 1 TABLET(500  MG) BY MOUTH BID 09/01/20   Delsa Grana, PA-C  MYRBETRIQ 50 MG TB24 tablet TAKE 1 TABLET(50 MG) BY MOUTH DAILY 05/05/21   Vaillancourt, Aldona Bar, PA-C  Trospium Chloride 60 MG CP24 TAKE 1 CAPSULE(60 MG) BY MOUTH DAILY 03/28/21   Vaillancourt, Aldona Bar, PA-C  vitamin B-12 (CYANOCOBALAMIN) 500 MCG tablet Take 500 mcg by mouth daily.    [provider]    Allergies Patient has no known allergies.  Family History  Problem Relation Age of Onset   Stroke Mother    Hypertension Mother    Diabetes Father    Heart disease Father    Lung cancer Father    Colon cancer Sister    Diabetes Sister    Kidney disease Daughter     Kidney cancer Daughter    Pulmonary embolism Sister    Diabetes Sister    Kidney disease Sister    Heart disease Sister    Atrial fibrillation Sister    Diabetes Brother    Heart disease Brother    AAA (abdominal aortic aneurysm) Brother    Cancer Brother        skin    Social History Social History   Tobacco Use   Smoking status: Former    Packs/day: 2.00    Years: 40.00    Pack years: 80.00    Types: Cigarettes    Quit date: 12/10/2018    Years since quitting: 2.5   Smokeless tobacco: Never  Vaping Use   Vaping Use: Never used  Substance Use Topics   Alcohol use: Not Currently   Drug use: Never    Review of Systems  Constitutional: No fever/chills Eyes: No visual changes. ENT: No sore throat. Cardiovascular: Denies chest pain. Respiratory: Denies shortness of breath. Gastrointestinal: No abdominal pain.  No nausea, no vomiting.  No diarrhea.  No constipation. Genitourinary: Negative for dysuria. Musculoskeletal: Positive for right upper arm pain. Skin: Negative for rash. Neurological: Negative for headaches, focal weakness or numbness.  ____________________________________________   PHYSICAL EXAM:  VITAL SIGNS: ED Triage Vitals  Enc Vitals Group     BP 06/22/21 1509 138/72     Pulse Rate 06/22/21 1509 85     Resp 06/22/21 1509 17     Temp 06/22/21 1509 98 F (36.7 C)     Temp Source 06/22/21 1509 Oral     SpO2 06/22/21 1509 97 %     Weight 06/22/21 1507 169 lb 15.6 oz (77.1 kg)     Height 06/22/21 1507 '5\' 2"'  (1.575 m)     Head Circumference --      Peak Flow --      Pain Score 06/22/21 1507 6     Pain Loc --      Pain Edu? --      Excl. in [place]? --     Constitutional: Alert and oriented. Well appearing and in no acute distress. Eyes: Conjunctivae are normal.  Head: Atraumatic. Nose: No congestion/rhinnorhea. Mouth/Throat: Mucous membranes are moist.  Oropharynx non-erythematous. Neck: No stridor.   Hematological/Lymphatic/Immunilogical: No  cervical lymphadenopathy. Cardiovascular: Normal rate, regular rhythm. Grossly normal heart sounds.  Good peripheral circulation. Respiratory: Normal respiratory effort.  No retractions. Lungs CTAB. Gastrointestinal: Soft and nontender. No distention. No abdominal bruits. Genitourinary:  Musculoskeletal: No focal tenderness over the right humerus.  Full range of motion of the right shoulder demonstrated.  No tenderness over the right elbow.  Full range of motion of the elbow demonstrated as well. Neurologic:  Normal speech and  language. No gross focal neurologic deficits are appreciated. No gait instability. Skin: Ecchymosis and superficial skin abrasion noted to the posterior aspect of the right upper arm Psychiatric: Mood and affect are normal. Speech and behavior are normal.  ____________________________________________   LABS (all labs ordered are listed, but only abnormal results are displayed)  Labs Reviewed - No data to display ____________________________________________  EKG  Not indicated ____________________________________________  RADIOLOGY  ED MD interpretation:    Healed remote fracture of the proximal humerus and subluxation of the humeral head possibly secondary to joint effusion I, Clarity Ciszek, personally viewed and evaluated these images (plain radiographs) as part of my medical decision making, as well as reviewing the written report by the radiologist.  Official radiology report(s): DG Humerus Right  Result Date: 06/22/2021 CLINICAL DATA:  Right arm pain after fall. EXAM: RIGHT HUMERUS - 2+ VIEW COMPARISON:  11/17/2019. FINDINGS: No evidence of acute fracture. Healed remote fracture deformity of the proximal humerus. Mild inferior subluxation of the humeral head. Partially imaged degenerative changes of the glenohumeral and acromioclavicular joints. Osteopenia. IMPRESSION: 1. No evidence of acute fracture. 2. Healed remote fracture deformity of the proximal  humerus. Mild inferior subluxation of the humeral head, which could be due to a joint effusion. Dedicated shoulder radiographs could better evaluate for acute fracture of the shoulder if there is clinical concern. Electronically Signed   By: Margaretha Sheffield MD   On: 06/22/2021 16:03    ____________________________________________   PROCEDURES  Procedure(s) performed (including Critical Care):  Procedures  ____________________________________________   INITIAL IMPRESSION / ASSESSMENT AND PLAN     80 year old female presenting to the emergency department for treatment and evaluation of right upper arm pain.  See HPI for further details.  X-ray shows a healed fracture of the proximal humerus but no acute injury.  There is also some concern for joint effusion in the shoulder, however the patient is able to demonstrate full range of motion.   ED COURSE  Patient placed in a sling and is to follow-up with her primary care provider if not improving over the week.  She was encouraged to continue Tylenol to help with pain control.    ___________________________________________   FINAL CLINICAL IMPRESSION(S) / ED DIAGNOSES  Final diagnoses:  Injury of right upper extremity, initial encounter     ED Discharge Orders     None        Whitney [name] was evaluated in Emergency Department on 06/22/2021 for the symptoms described in the history of present illness. She was evaluated in the context of the global COVID-19 pandemic, which necessitated consideration that the patient might be at risk for infection with the SARS-CoV-2 virus that causes COVID-19. Institutional protocols and algorithms that pertain to the evaluation of patients at risk for COVID-19 are in a state of rapid change based on information released by regulatory bodies including the CDC and federal and state organizations. These policies and algorithms were followed during the patient's care in the ED.   Note:  This  document was prepared using Dragon voice recognition software and may include unintentional dictation errors.    Victorino Dike, FNP 06/22/21 Meade Maw, MD 06/22/21 [phone]

## 2021-06-22 NOTE — Telephone Encounter (Signed)
Dr. Cathie Hoops follows patient for iron deficiency anemia. These appts were to check iron labs and discuss results.   I attempted to contact patient but no answer. Unable to leave message due to mailbox being full. Looks like pt is currently in ED.

## 2021-06-22 NOTE — Discharge Instructions (Addendum)
Please follow up with primary care if pain does not go away in a week or so.  Use the sling off and on throughout the day. Do not wear it all the time.  Return to the ER for symptoms that change or worsen if unable to schedule an appointment.

## 2021-06-22 NOTE — ED Triage Notes (Signed)
Pt in via EMS from [facility] post fall. Pt c/o pain to right arm. Per staff pt fell Monday. Pt reports his head but no LOC, FSBS 222. EMS reports pt with abnormal gait which is baseline for her.

## 2021-06-22 NOTE — ED Notes (Signed)
See triage note  Presents via EMS from [facility]  S/p fall on Monday   Having pain to right arm

## 2021-06-22 NOTE — ED Triage Notes (Signed)
Pt comes into the ED via [facility] from [facility] c/o fall on Monday. Pt c/o right arm pain.  No LOC, no blood thinners, and patient reports her abnormal gait is her baseline.  Pt in NAD at this time with even and unlabored respirations.

## 2021-06-25 ENCOUNTER — Other Ambulatory Visit: Payer: Self-pay | Admitting: Physician Assistant

## 2021-06-25 DIAGNOSIS — N3281 Overactive bladder: Secondary | ICD-10-CM

## 2021-06-27 ENCOUNTER — Other Ambulatory Visit: Payer: Self-pay

## 2021-06-27 ENCOUNTER — Emergency Department: Payer: Medicare PPO

## 2021-06-27 ENCOUNTER — Emergency Department
Admission: EM | Admit: 2021-06-27 | Discharge: 2021-06-27 | Disposition: A | Discharge status: Home / Self Care | Payer: Medicare PPO | Attending: Emergency Medicine | Admitting: Emergency Medicine

## 2021-06-27 DIAGNOSIS — Z7982 Long term (current) use of aspirin: Secondary | ICD-10-CM | POA: Insufficient documentation

## 2021-06-27 DIAGNOSIS — S51812A Laceration without foreign body of left forearm, initial encounter: Secondary | ICD-10-CM | POA: Diagnosis not present

## 2021-06-27 DIAGNOSIS — E119 Type 2 diabetes mellitus without complications: Secondary | ICD-10-CM | POA: Diagnosis not present

## 2021-06-27 DIAGNOSIS — Z8616 Personal history of COVID-19: Secondary | ICD-10-CM | POA: Diagnosis not present

## 2021-06-27 DIAGNOSIS — Z7984 Long term (current) use of oral hypoglycemic drugs: Secondary | ICD-10-CM | POA: Insufficient documentation

## 2021-06-27 DIAGNOSIS — S0083XA Contusion of other part of head, initial encounter: Secondary | ICD-10-CM

## 2021-06-27 DIAGNOSIS — S060X0A Concussion without loss of consciousness, initial encounter: Secondary | ICD-10-CM | POA: Diagnosis not present

## 2021-06-27 DIAGNOSIS — S00531A Contusion of lip, initial encounter: Secondary | ICD-10-CM | POA: Insufficient documentation

## 2021-06-27 DIAGNOSIS — Z87891 Personal history of nicotine dependence: Secondary | ICD-10-CM | POA: Diagnosis not present

## 2021-06-27 DIAGNOSIS — W01198A Fall on same level from slipping, tripping and stumbling with subsequent striking against other object, initial encounter: Secondary | ICD-10-CM | POA: Diagnosis not present

## 2021-06-27 DIAGNOSIS — S80211A Abrasion, right knee, initial encounter: Secondary | ICD-10-CM | POA: Insufficient documentation

## 2021-06-27 DIAGNOSIS — S0990XA Unspecified injury of head, initial encounter: Secondary | ICD-10-CM

## 2021-06-27 DIAGNOSIS — S59912A Unspecified injury of left forearm, initial encounter: Secondary | ICD-10-CM | POA: Diagnosis present

## 2021-06-27 DIAGNOSIS — W19XXXA Unspecified fall, initial encounter: Secondary | ICD-10-CM

## 2021-06-27 DIAGNOSIS — Y92009 Unspecified place in unspecified non-institutional (private) residence as the place of occurrence of the external cause: Secondary | ICD-10-CM

## 2021-06-27 NOTE — ED Provider Notes (Signed)
[facility] Emergency Department Provider Note   ____________________________________________   Event Date/Time   First MD Initiated Contact with Patient 06/27/21 1424     (approximate)  I have reviewed the triage vital signs and the nursing notes.   HISTORY  Chief Complaint Fall    HPI Whitney [name] is a 80 y.o. female who presents via EMS after mechanical fall from standing suffering injuries to the nose, left forearm, and right knee.  Patient has abrasions to all of the sites without any lacerations or decreased range of motion at the left arm or right leg.  Patient states that she has had a history of similar falls over the last few months that she states she has "lost her footing".  Patient denies any loss of consciousness and states that she uses 81 mg aspirin but denies any other anticoagulant or antiplatelet use.  Patient currently denies any vision changes, tinnitus, difficulty speaking, facial droop, sore throat, chest pain, shortness of breath, abdominal pain, nausea/vomiting/diarrhea, dysuria, or weakness/numbness/paresthesias in any extremity          Past Medical History:  Diagnosis Date   Anemia    Arthritis    Broken shoulder    left humerus    COVID-19    Depression    Diabetes mellitus without complication (HCC)    Goiter    Hyperlipidemia    Overactive bladder    Parkinson's disease ([place])    Pneumonia    Right arm weakness    limited movement   Unsteady gait     Patient Active Problem List   Diagnosis Date Noted   Thyroid goiter 08/03/2020   Impaired mobility and ADLs 04/15/2020   Vitamin D deficiency 07/20/2019   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[place]) 07/20/2019   At high risk for falls 07/20/2019   Parkinson disease ([place]) 01/15/2019   Hyperlipidemia 01/15/2019   Diabetes mellitus type 2 in obese ([place]) 01/13/2019   Overactive bladder 01/13/2019   Obesity (BMI 30.0-34.9) 01/13/2019    Past  Surgical History:  Procedure Laterality Date   [place]   post hysterectomy   NECK SURGERY     anterior cervical disc fusion   THYROIDECTOMY Left 08/03/2020   Procedure: THYROIDECTOMY-HEMI;  Surgeon: Clyde Canterbury, MD;  Location: [facility] ORS;  Service: ENT;  Laterality: Left;    Prior to Admission medications   Medication Sig Start Date End Date Taking? Authorizing Provider  ACCU-CHEK GUIDE test strip  04/27/20   [provider]  Accu-Chek Softclix Lancets lancets  04/27/20   [provider]  acetaminophen (TYLENOL) 500 MG tablet Take 500-1,000 mg by mouth every 6 (six) hours as needed (for pain.).    [provider]  Alcohol Swabs (B-D SINGLE USE SWABS REGULAR) PADS  04/27/20   [provider]  Blood Glucose Monitoring Suppl (ACCU-CHEK GUIDE ME) w/Device KIT  04/27/20   [provider]  buPROPion (WELLBUTRIN XL) 150 MG 24 hr tablet TAKE 1 TABLET(150 MG) BY MOUTH DAILY 04/01/20   Delsa Grana, PA-C  carbidopa-levodopa (SINEMET IR) 25-100 MG tablet Take by mouth. Take 1 tablet by mouth 4 (four) times daily 09/28/20 09/28/21  [provider]  cholecalciferol (VITAMIN D3) 25 MCG (1000 UNIT) tablet Take 1,000 Units by mouth daily.     [provider]  ferrous sulfate 325 (65 FE) MG tablet Take 325 mg by mouth daily with breakfast.     [provider]  meloxicam (MOBIC) 7.5 MG tablet Take by mouth. 03/16/21 03/16/22  [provider]  metFORMIN (GLUCOPHAGE-XR) 500 MG 24 hr tablet Take 1 tablet (500 mg total) by mouth 2 (two) times daily with a meal. TAKE 1 TABLET(500 MG) BY MOUTH BID Patient taking differently: Take 500 mg by mouth in the morning, at noon, and at bedtime. TAKE 1 TABLET(500 MG) BY MOUTH BID 09/01/20   Delsa Grana, PA-C  MYRBETRIQ 50 MG TB24 tablet TAKE 1 TABLET(50 MG) BY MOUTH DAILY 06/26/21   Vaillancourt, Aldona Bar, PA-C  Trospium Chloride 60 MG CP24 TAKE 1 CAPSULE(60 MG)  BY MOUTH DAILY 06/26/21   Vaillancourt, Aldona Bar, PA-C  vitamin B-12 (CYANOCOBALAMIN) 500 MCG tablet Take 500 mcg by mouth daily.    [provider]    Allergies Patient has no known allergies.  Family History  Problem Relation Age of Onset   Stroke Mother    Hypertension Mother    Diabetes Father    Heart disease Father    Lung cancer Father    Colon cancer Sister    Diabetes Sister    Kidney disease Daughter    Kidney cancer Daughter    Pulmonary embolism Sister    Diabetes Sister    Kidney disease Sister    Heart disease Sister    Atrial fibrillation Sister    Diabetes Brother    Heart disease Brother    AAA (abdominal aortic aneurysm) Brother    Cancer Brother        skin    Social History Social History   Tobacco Use   Smoking status: Former    Packs/day: 2.00    Years: 40.00    Pack years: 80.00    Types: Cigarettes    Quit date: 12/10/2018    Years since quitting: 2.5   Smokeless tobacco: Never  Vaping Use   Vaping Use: Never used  Substance Use Topics   Alcohol use: Not Currently   Drug use: Never    Review of Systems Constitutional: No fever/chills Eyes: No visual changes. ENT: No sore throat. Cardiovascular: Denies chest pain. Respiratory: Denies shortness of breath. Gastrointestinal: No abdominal pain.  No nausea, no vomiting.  No diarrhea. Genitourinary: Negative for dysuria. Musculoskeletal: Negative for acute arthralgias Skin: Negative for rash.  Abrasion over right knee, skin tear to left forearm, small abrasion to upper lip Neurological: Negative for headaches, weakness/numbness/paresthesias in any extremity Psychiatric: Negative for suicidal ideation/homicidal ideation   ____________________________________________   PHYSICAL EXAM:  VITAL SIGNS: ED Triage Vitals  Enc Vitals Group     BP 06/27/21 1305 (!) 159/90     Pulse Rate 06/27/21 1305 85     Resp 06/27/21 1305 17     Temp 06/27/21 1305 98.7 F (37.1 C)     Temp  src --      SpO2 06/27/21 1305 100 %     Weight 06/27/21 1346 169 lb 15.6 oz (77.1 kg)     Height 06/27/21 1346 _0  (1.575 m)     Head Circumference --      Peak Flow --      Pain Score 06/27/21 1300 5     Pain Loc --      Pain Edu? --      Excl. in [place]? --    Constitutional: Alert and oriented. Well appearing and in no acute distress. Eyes: Conjunctivae are normal. PERRL. Head: Bruising and abrasion to upper lip. Nose: No congestion/rhinnorhea. Mouth/Throat: Mucous membranes are moist.  Neck: No stridor Cardiovascular: Grossly normal heart sounds.  Good peripheral circulation. Respiratory: Normal respiratory effort.  No retractions. Gastrointestinal: Soft and nontender. No distention. Musculoskeletal: No obvious deformities Neurologic:  Normal speech and language. No gross focal neurologic deficits are appreciated. Skin:  Skin is warm and dry. No rash noted.  4 cm x 1 cm skin tear to the medial aspect of the left forearm.  2 cm x 1 cm abrasion to the anterior right knee Psychiatric: Mood and affect are normal. Speech and behavior are normal.  ____________________________________________   LABS (all labs ordered are listed, but only abnormal results are displayed)  Labs Reviewed - No data to display  RADIOLOGY  ED MD interpretation: CT of the head, cervical spine, and maxillofacial structures did not show any evidence of acute abnormalities  Official radiology report(s): CT Head Wo Contrast  Result Date: 06/27/2021 CLINICAL DATA:  Status post fall. EXAM: CT HEAD WITHOUT CONTRAST CT MAXILLOFACIAL WITHOUT CONTRAST CT CERVICAL SPINE WITHOUT CONTRAST TECHNIQUE: Multidetector CT imaging of the head, cervical spine, and maxillofacial structures were performed using the standard protocol without intravenous contrast. Multiplanar CT image reconstructions of the cervical spine and maxillofacial structures were also generated. COMPARISON:  February 15, 2021. FINDINGS: CT HEAD FINDINGS Brain:  No evidence of acute infarction, hemorrhage, hydrocephalus, extra-axial collection or mass lesion/mass effect. Vascular: No hyperdense vessel or unexpected calcification. Skull: Normal. Negative for fracture or focal lesion. Other: None. CT MAXILLOFACIAL FINDINGS Osseous: No fracture or mandibular dislocation. No destructive process. Orbits: Negative. No traumatic or inflammatory finding. Sinuses: Clear. Soft tissues: Negative. CT CERVICAL SPINE FINDINGS Alignment: Minimal grade 1 anterolisthesis of C4-5 is noted secondary to posterior facet joint hypertrophy. Skull base and vertebrae: No acute fracture. No primary bone lesion or focal pathologic process. Soft tissues and spinal canal: No prevertebral fluid or swelling. No visible canal hematoma. Disc levels:  Status post surgical anterior fusion of C5-6 and C6-7. Upper chest: Negative. Other: None. IMPRESSION: No acute intracranial abnormality seen. No definite abnormality seen in maxillofacial region. Postsurgical and degenerative changes as described in the cervical spine. No acute abnormality is noted in the cervical spine. Electronically Signed   By: Marijo Conception M.D.   On: 06/27/2021 13:57   CT Cervical Spine Wo Contrast  Result Date: 06/27/2021 CLINICAL DATA:  Status post fall. EXAM: CT HEAD WITHOUT CONTRAST CT MAXILLOFACIAL WITHOUT CONTRAST CT CERVICAL SPINE WITHOUT CONTRAST TECHNIQUE: Multidetector CT imaging of the head, cervical spine, and maxillofacial structures were performed using the standard protocol without intravenous contrast. Multiplanar CT image reconstructions of the cervical spine and maxillofacial structures were also generated. COMPARISON:  February 15, 2021. FINDINGS: CT HEAD FINDINGS Brain: No evidence of acute infarction, hemorrhage, hydrocephalus, extra-axial collection or mass lesion/mass effect. Vascular: No hyperdense vessel or unexpected calcification. Skull: Normal. Negative for fracture or focal lesion. Other: None. CT  MAXILLOFACIAL FINDINGS Osseous: No fracture or mandibular dislocation. No destructive process. Orbits: Negative. No traumatic or inflammatory finding. Sinuses: Clear. Soft tissues: Negative. CT CERVICAL SPINE FINDINGS Alignment: Minimal grade 1 anterolisthesis of C4-5 is noted secondary to posterior facet joint hypertrophy. Skull base and vertebrae: No acute fracture. No primary bone lesion or focal pathologic process. Soft tissues and spinal canal: No prevertebral fluid or swelling. No visible canal hematoma. Disc levels:  Status post surgical anterior fusion of C5-6 and C6-7. Upper chest: Negative. Other: None. IMPRESSION: No acute intracranial abnormality seen. No definite abnormality seen in maxillofacial region. Postsurgical and degenerative changes as described in the cervical  spine. No acute abnormality is noted in the cervical spine. Electronically Signed   By: Marijo Conception M.D.   On: 06/27/2021 13:57   DG Foot Complete Right  Result Date: 06/27/2021 CLINICAL DATA:  RIGHT foot pain post fall EXAM: RIGHT FOOT COMPLETE - 3+ VIEW COMPARISON:  None FINDINGS: Osseous demineralization. Degenerative changes at first MTP joint with joint space narrowing and spur formation. Diffuse soft tissue swelling. No acute fracture, dislocation, or bone destruction. Moderate-sized plantar calcaneal spur. IMPRESSION: Soft tissue swelling without acute bony findings. Osseous demineralization with degenerative changes at first MTP joint and moderate plantar calcaneal spur. Electronically Signed   By: Lavonia Dana M.D.   On: 06/27/2021 16:05   CT Maxillofacial Wo Contrast  Result Date: 06/27/2021 CLINICAL DATA:  Status post fall. EXAM: CT HEAD WITHOUT CONTRAST CT MAXILLOFACIAL WITHOUT CONTRAST CT CERVICAL SPINE WITHOUT CONTRAST TECHNIQUE: Multidetector CT imaging of the head, cervical spine, and maxillofacial structures were performed using the standard protocol without intravenous contrast. Multiplanar CT image  reconstructions of the cervical spine and maxillofacial structures were also generated. COMPARISON:  February 15, 2021. FINDINGS: CT HEAD FINDINGS Brain: No evidence of acute infarction, hemorrhage, hydrocephalus, extra-axial collection or mass lesion/mass effect. Vascular: No hyperdense vessel or unexpected calcification. Skull: Normal. Negative for fracture or focal lesion. Other: None. CT MAXILLOFACIAL FINDINGS Osseous: No fracture or mandibular dislocation. No destructive process. Orbits: Negative. No traumatic or inflammatory finding. Sinuses: Clear. Soft tissues: Negative. CT CERVICAL SPINE FINDINGS Alignment: Minimal grade 1 anterolisthesis of C4-5 is noted secondary to posterior facet joint hypertrophy. Skull base and vertebrae: No acute fracture. No primary bone lesion or focal pathologic process. Soft tissues and spinal canal: No prevertebral fluid or swelling. No visible canal hematoma. Disc levels:  Status post surgical anterior fusion of C5-6 and C6-7. Upper chest: Negative. Other: None. IMPRESSION: No acute intracranial abnormality seen. No definite abnormality seen in maxillofacial region. Postsurgical and degenerative changes as described in the cervical spine. No acute abnormality is noted in the cervical spine. Electronically Signed   By: Marijo Conception M.D.   On: 06/27/2021 13:57    ____________________________________________   PROCEDURES  Procedure(s) performed (including Critical Care):  .1-3 Lead EKG Interpretation  Date/Time: 06/27/2021 4:09 PM Performed by: Naaman Plummer, MD Authorized by: Naaman Plummer, MD     Interpretation: normal     ECG rate:  84   ECG rate assessment: normal     Rhythm: sinus rhythm     Ectopy: none     Conduction: normal   ..Laceration Repair  Date/Time: 06/27/2021 4:09 PM Performed by: Naaman Plummer, MD Authorized by: Naaman Plummer, MD   Consent:    Consent obtained:  Verbal   Consent given by:  Patient   Risks, benefits, and  alternatives were discussed: yes     Risks discussed:  Infection, pain, poor cosmetic result and poor wound healing   Alternatives discussed:  No treatment, delayed treatment and observation Universal protocol:    Immediately prior to procedure, a time out was called: yes     Patient identity confirmed:  Verbally with patient Anesthesia:    Anesthesia method:  None Laceration details:    Location:  Shoulder/arm   Shoulder/arm location:  L lower arm   Length (cm):  4   Depth (mm):  3 Pre-procedure details:    Preparation:  Patient was prepped and draped in usual sterile fashion Exploration:    Limited defect created (wound extended): no  Contaminated: no   Treatment:    Area cleansed with:  Povidone-iodine and saline   Amount of cleaning:  Standard   Irrigation solution:  Sterile saline   Irrigation volume:  100   Irrigation method:  Pressure wash   Visualized foreign bodies/material removed: no     Debridement:  None   Undermining:  None   Scar revision: no   Skin repair:    Repair method:  Steri-Strips   Number of Steri-Strips:  3 Approximation:    Approximation:  Loose Repair type:    Repair type:  Simple Post-procedure details:    Dressing:  Non-adherent dressing   Procedure completion:  Tolerated well, no immediate complications   ____________________________________________   INITIAL IMPRESSION / ASSESSMENT AND PLAN / ED COURSE  As part of my medical decision making, I reviewed the following data within the electronic medical record, if available:  Nursing notes reviewed and incorporated, Labs reviewed, EKG interpreted, Old chart reviewed, Radiograph reviewed and Notes from prior ED visits reviewed and incorporated        Patient presenting with head trauma.  Patient's neurological exam was non-focal and unremarkable.  [place] Head CT Rule was applied and patient did not fall into the low risk category so a head CT was obtained.  This showed no significant  findings.  At this time, it is felt that the most likely explanation for the patient's symptoms is concussion.   I also considered SAH, SDH, Epidural Hematoma, IPH, skull fracture, migraine but this appears less likely considering the data gathered thus far.   Patient provided wound care.   Patient remained stable and neurologically intact while in the emergency department.  Discussed warning signs that would prompt return to ED.  Head trauma handout was provided.  Discussed in detail concussion management.  No sports or strenuous activity until symptoms free.  Return to emergency department urgently if new or worsening symptoms develop.    Impression:  Concussion Left forearm skin tear Right knee abrasion Upper lip abrasion Fall from standing  Plan  Discharge from ED Tylenol for pain control. Avoid aspirin, NSAIDs, or other blood thinners. Advised patient on supportive measures for cognitive rest - avoid use of cognitive function for at least 24 hours.  This means no tv, books, texting, computers, etc. Limit visitors to the house.  Head trauma instructions provided in discharge instructions Instructed Pt to monitor for neurologic symptoms, severe HA, change in mental status, seizures, loss of conciousness. Instructed Pt to f/up w/ PCP in 3 days or ETC should symptoms worsen or not improve. Pt verbally expressed understanding and all questions were addressed to Pt's satisfaction.      ____________________________________________   FINAL CLINICAL IMPRESSION(S) / ED DIAGNOSES  Final diagnoses:  None     ED Discharge Orders     None        Note:  This document was prepared using Dragon voice recognition software and may include unintentional dictation errors.    Naaman Plummer, MD 06/27/21 [phone]

## 2021-06-27 NOTE — ED Notes (Signed)
See triage note  Presents s/p fall  States lost her balance and fell    She is from [facility] note to nose,left arm and right knee

## 2021-06-27 NOTE — ED Triage Notes (Signed)
Pt comes via EMs from [facility] with c/o mechanical fall. Pt states she stumbled and lost balance. Pt fell hitting her nose, left arm abrasion, right knee. Bandages in place and bleeding controlled at this time.  Pt denies any LOC or blood thinners.

## 2021-07-13 ENCOUNTER — Ambulatory Visit: Payer: Medicare PPO

## 2021-07-14 ENCOUNTER — Other Ambulatory Visit: Payer: Self-pay

## 2021-07-14 ENCOUNTER — Ambulatory Visit (INDEPENDENT_AMBULATORY_CARE_PROVIDER_SITE_OTHER): Payer: Medicare PPO | Admitting: Nurse Practitioner

## 2021-07-14 DIAGNOSIS — E669 Obesity, unspecified: Secondary | ICD-10-CM

## 2021-07-14 DIAGNOSIS — E1169 Type 2 diabetes mellitus with other specified complication: Secondary | ICD-10-CM

## 2021-07-14 DIAGNOSIS — E782 Mixed hyperlipidemia: Secondary | ICD-10-CM | POA: Diagnosis not present

## 2021-07-14 DIAGNOSIS — M7989 Other specified soft tissue disorders: Secondary | ICD-10-CM

## 2021-07-14 DIAGNOSIS — R600 Localized edema: Secondary | ICD-10-CM | POA: Diagnosis not present

## 2021-07-14 NOTE — Progress Notes (Signed)
Subjective:    Patient ID: Whitney [name], female    DOB: 06/30/1941, 80 y.o.   MRN: [id number] Chief Complaint  Patient presents with   New Patient (Initial Visit)    Ref Whitney [name] rle edema    Whitney [name] is a 80 year old female that is seen for evaluation of leg swelling. The patient first noticed the swelling remotely but is now concerned because of a significant increase in the overall edema. The swelling is associated with pain and discoloration. The patient notes that in the morning the legs are significantly improved but they steadily worsened throughout the course of the day. Elevation makes the legs better, dependency makes them much worse.   Patient has had blisters and weeping with her lower extremity swelling.  Does note that the swelling has been made worse after her thyroid surgery approximately a year ago.  Some of her swelling progresses above the right knee.  The patient denies any recent changes in their medications.  The patient has not been wearing graduated compression.  The patient has no had any past angiography, interventions or vascular surgery.  The patient denies a history of DVT or PE. There is no prior history of phlebitis. There is no history of primary lymphedema.  There is no history of radiation treatment to the groin or pelvis No history of malignancies. No history of trauma or groin or pelvic surgery. No history of foreign travel or parasitic infections area      Review of Systems  Cardiovascular:  Positive for leg swelling.  All other systems reviewed and are negative.     Objective:   Physical Exam Vitals reviewed.  HENT:     Head: Normocephalic.  Cardiovascular:     Rate and Rhythm: Normal rate.     Pulses: Decreased pulses.  Pulmonary:     Effort: Pulmonary effort is normal.  Musculoskeletal:     Right lower leg: 3+ Edema present.     Left lower leg: 3+ Edema present.  Neurological:     Mental Status: She is alert and  oriented to person, place, and time.  Psychiatric:        Mood and Affect: Mood normal.        Behavior: Behavior normal.        Thought Content: Thought content normal.        Judgment: Judgment normal.    There were no vitals taken for this visit.  Past Medical History:  Diagnosis Date   Anemia    Arthritis    Broken shoulder    left humerus    COVID-19    Depression    Diabetes mellitus without complication (HCC)    Goiter    Hyperlipidemia    Overactive bladder    Parkinson's disease ([place])    Pneumonia    Right arm weakness    limited movement   Unsteady gait     Social History   Socioeconomic History   Marital status: Widowed    Spouse name: Whitney [name]   Number of children: 2   Years of education: 12   Highest education level: Some college, no degree  Occupational History   Occupation: retired school system  Tobacco Use   Smoking status: Former    Packs/day: 2.00    Years: 40.00    Pack years: 80.00    Types: Cigarettes    Quit date: 12/10/2018    Years since quitting: 2.6   Smokeless tobacco: Never  Vaping Use   Vaping  Use: Never used  Substance and Sexual Activity   Alcohol use: Not Currently   Drug use: Never   Sexual activity: Not Currently  Other Topics Concern   Not on file  Social History Narrative   Lost her husband of 44 years in oct 2019. Moved back from [place] in Nov 2019   Social Determinants of Health   Financial Resource Strain: Not on file  Food Insecurity: Not on file  Transportation Needs: Not on file  Physical Activity: Not on file  Stress: Not on file  Social Connections: Not on file  Intimate Partner Violence: Not on file    Past Surgical History:  Procedure Laterality Date   [place]   post hysterectomy   NECK SURGERY     anterior cervical disc fusion   THYROIDECTOMY Left 08/03/2020   Procedure: Whitney [name];  Surgeon: Whitney Canterbury, MD;  Location: [facility] ORS;  Service:  ENT;  Laterality: Left;    Family History  Problem Relation Age of Onset   Stroke Mother    Hypertension Mother    Diabetes Father    Heart disease Father    Lung cancer Father    Colon cancer Sister    Diabetes Sister    Kidney disease Daughter    Kidney cancer Daughter    Pulmonary embolism Sister    Diabetes Sister    Kidney disease Sister    Heart disease Sister    Atrial fibrillation Sister    Diabetes Brother    Heart disease Brother    AAA (abdominal aortic aneurysm) Brother    Cancer Brother        skin    No Known Allergies  CBC Latest Ref Rng & Units 12/20/2020 10/11/2020 09/18/2020  WBC 4.0 - 10.5 K/uL 3.7(L) 3.2(L) 3.5(L)  Hemoglobin 12.0 - 15.0 g/dL 12.3 10.5(L) 9.5(L)  Hematocrit 36.0 - 46.0 % 38.1 33.1(L) 30.5(L)  Platelets 150 - 400 K/uL 107(L) 142(L) 131(L)      CMP     Component Value Date/Time   NA 138 09/18/2020 1259   K 4.1 09/18/2020 1259   CL 103 09/18/2020 1259   CO2 25 09/18/2020 1259   GLUCOSE 247 (H) 09/18/2020 1259   BUN 13 09/18/2020 1259   CREATININE 0.61 09/18/2020 1259   CREATININE 0.58 (L) 09/13/2020 1023   CALCIUM 8.8 (L) 09/18/2020 1259   PROT 6.6 09/18/2020 1259   ALBUMIN 3.5 09/18/2020 1259   AST 35 09/18/2020 1259   ALT 25 09/18/2020 1259   ALKPHOS 112 09/18/2020 1259   BILITOT 0.6 09/18/2020 1259   GFRNONAA >60 09/18/2020 1259   GFRNONAA 88 09/13/2020 1023   GFRAA 102 09/13/2020 1023     No results found.     Assessment & Plan:   1. Leg swelling No surgery or intervention at this point in time.    I have had a long discussion with the patient regarding venous insufficiency and why it  causes symptoms, specifically venous ulceration . I have discussed with the patient the chronic skin changes that accompany venous insufficiency and the long term sequela such as infection and recurring  ulceration.  Patient will be placed in [employer] which will be changed weekly drainage permitting.  In addition, behavioral  modification including several periods of elevation of the lower extremities during the day will be continued. Achieving a position with the ankles at heart level was stressed to the patient  The patient is  instructed to begin routine exercise, especially walking on a daily basis  Patient should undergo duplex ultrasound of the venous system to ensure that DVT or reflux is not present. The patient will return in 4 weeks for evaluation of the lower extremity swelling after [place] wraps.  2. Diabetes mellitus type 2 in obese [facility]) Continue hypoglycemic medications as already ordered, these medications have been reviewed and there are no changes at this time.  Hgb A1C to be monitored as already arranged by primary service   3. Mixed hyperlipidemia Continue statin as ordered and reviewed, no changes at this time    Current Outpatient Medications on File Prior to Visit  Medication Sig Dispense Refill   ACCU-CHEK GUIDE test strip      Accu-Chek Softclix Lancets lancets      acetaminophen (TYLENOL) 500 MG tablet Take 500-1,000 mg by mouth every 6 (six) hours as needed (for pain.).     Alcohol Swabs (B-D SINGLE USE SWABS REGULAR) PADS      Blood Glucose Monitoring Suppl (ACCU-CHEK GUIDE ME) w/Device KIT      buPROPion (WELLBUTRIN XL) 150 MG 24 hr tablet TAKE 1 TABLET(150 MG) BY MOUTH DAILY 90 tablet 1   carbidopa-levodopa (SINEMET IR) 25-100 MG tablet Take by mouth. Take 1 tablet by mouth 4 (four) times daily (Patient not taking: No sig reported)     cholecalciferol (VITAMIN D3) 25 MCG (1000 UNIT) tablet Take 1,000 Units by mouth daily.      ferrous sulfate 325 (65 FE) MG tablet Take 325 mg by mouth daily with breakfast.      metFORMIN (GLUCOPHAGE-XR) 500 MG 24 hr tablet Take 1 tablet (500 mg total) by mouth 2 (two) times daily with a meal. TAKE 1 TABLET(500 MG) BY MOUTH BID (Patient taking differently: Take 500 mg by mouth in the morning, at noon, and at bedtime. TAKE 1 TABLET(500 MG) BY MOUTH BID)  180 tablet 3   Trospium Chloride 60 MG CP24 TAKE 1 CAPSULE(60 MG) BY MOUTH DAILY 90 capsule 0   vitamin B-12 (CYANOCOBALAMIN) 500 MCG tablet Take 500 mcg by mouth daily.     meloxicam (MOBIC) 7.5 MG tablet Take by mouth. (Patient not taking: No sig reported)     No current facility-administered medications on file prior to visit.    There are no Patient Instructions on file for this visit. No follow-ups on file.   Kris Hartmann, NP

## 2021-07-20 ENCOUNTER — Emergency Department
Admission: EM | Admit: 2021-07-20 | Discharge: 2021-07-20 | Disposition: A | Discharge status: Home / Self Care | Payer: Medicare PPO | Attending: Emergency Medicine | Admitting: Emergency Medicine

## 2021-07-20 ENCOUNTER — Emergency Department: Payer: Medicare PPO

## 2021-07-20 ENCOUNTER — Encounter: Payer: Self-pay | Admitting: Physician Assistant

## 2021-07-20 ENCOUNTER — Other Ambulatory Visit: Payer: Self-pay | Admitting: Physician Assistant

## 2021-07-20 ENCOUNTER — Ambulatory Visit (INDEPENDENT_AMBULATORY_CARE_PROVIDER_SITE_OTHER): Payer: Medicare PPO | Admitting: Physician Assistant

## 2021-07-20 ENCOUNTER — Other Ambulatory Visit: Payer: Self-pay

## 2021-07-20 VITALS — BP 143/82 | HR 98 | Ht 62.0 in | Wt 172.0 lb

## 2021-07-20 DIAGNOSIS — Z87891 Personal history of nicotine dependence: Secondary | ICD-10-CM | POA: Diagnosis not present

## 2021-07-20 DIAGNOSIS — W01198A Fall on same level from slipping, tripping and stumbling with subsequent striking against other object, initial encounter: Secondary | ICD-10-CM | POA: Insufficient documentation

## 2021-07-20 DIAGNOSIS — G20 Parkinson's disease: Secondary | ICD-10-CM | POA: Insufficient documentation

## 2021-07-20 DIAGNOSIS — E119 Type 2 diabetes mellitus without complications: Secondary | ICD-10-CM | POA: Insufficient documentation

## 2021-07-20 DIAGNOSIS — Z8616 Personal history of COVID-19: Secondary | ICD-10-CM | POA: Insufficient documentation

## 2021-07-20 DIAGNOSIS — W19XXXA Unspecified fall, initial encounter: Secondary | ICD-10-CM

## 2021-07-20 DIAGNOSIS — Z79899 Other long term (current) drug therapy: Secondary | ICD-10-CM | POA: Insufficient documentation

## 2021-07-20 DIAGNOSIS — S0083XA Contusion of other part of head, initial encounter: Secondary | ICD-10-CM | POA: Insufficient documentation

## 2021-07-20 DIAGNOSIS — N3281 Overactive bladder: Secondary | ICD-10-CM | POA: Diagnosis not present

## 2021-07-20 DIAGNOSIS — S59912A Unspecified injury of left forearm, initial encounter: Secondary | ICD-10-CM | POA: Diagnosis present

## 2021-07-20 DIAGNOSIS — Z7984 Long term (current) use of oral hypoglycemic drugs: Secondary | ICD-10-CM | POA: Diagnosis not present

## 2021-07-20 DIAGNOSIS — M542 Cervicalgia: Secondary | ICD-10-CM | POA: Diagnosis not present

## 2021-07-20 DIAGNOSIS — S51812A Laceration without foreign body of left forearm, initial encounter: Secondary | ICD-10-CM | POA: Diagnosis not present

## 2021-07-20 LAB — BLADDER SCAN AMB NON-IMAGING

## 2021-07-20 NOTE — Patient Instructions (Signed)
Continue Myrbetriq and trospium. I'll see you back next year. If you would like to see Dr. Sherron Monday for a second opinion or if you would like to try PTNS, please let me know and I will set this up for you.

## 2021-07-20 NOTE — Discharge Instructions (Addendum)
If you take Benadryl at night only take 1 Benadryl tablet.  If you take 2 you will be too drowsy and this will make you dizzy. Keep the area on your left forearm as dry as possible. Clean the abrasions on your face with soap and water Return to emergency department worsening

## 2021-07-20 NOTE — Progress Notes (Signed)
07/20/2021 5:38 PM   [name] 12/31/1940 [id number]  CC: Chief Complaint  Patient presents with   Over Active Bladder   HPI: Whitney [name] is a 80 y.o. female with PMH OAB wet, Parkinson's disease, and diabetes with peripheral neuropathy who presents today for symptom recheck on Myrbetriq 50 mg and trospium ER 60 mg daily.  Today she reports that of the numerous drug regimens she has tried for her OAB, combination Myrbetriq 50 mg and trospium has been the most effective in managing her frequency, urgency, and urge incontinence.  That said, she is not yet at her treatment goal and thinks there is room for improvement.    She continues to experience foot on the floor phenomenon.  However, she states she is no longer voiding every hour.  She has approximately 3 episodes of urge incontinence daily.  She denies constipation or diarrhea.  PVR 3 mL.  PMH: Past Medical History:  Diagnosis Date   Anemia    Arthritis    Broken shoulder    left humerus    COVID-19    Depression    Diabetes mellitus without complication ([place])    Goiter    Hyperlipidemia    Overactive bladder    Parkinson's disease ([place])    Pneumonia    Right arm weakness    limited movement   Unsteady gait     Surgical History: Past Surgical History:  Procedure Laterality Date   [place]   post hysterectomy   NECK SURGERY     anterior cervical disc fusion   THYROIDECTOMY Left 08/03/2020   Procedure: THYROIDECTOMY-HEMI;  Surgeon: Clyde Canterbury, MD;  Location: [facility] ORS;  Service: ENT;  Laterality: Left;    Home Medications:  Allergies as of 07/20/2021   No Known Allergies      Medication List        Accurate as of July 20, 2021  5:38 PM. If you have any questions, ask your nurse or doctor.          Accu-Chek Guide Me w/Device Kit   Accu-Chek Guide test strip Generic drug: glucose blood   Accu-Chek Softclix Lancets lancets   acetaminophen  500 MG tablet Commonly known as: TYLENOL Take 500-1,000 mg by mouth every 6 (six) hours as needed (for pain.).   B-D SINGLE USE SWABS REGULAR Pads   buPROPion 150 MG 24 hr tablet Commonly known as: WELLBUTRIN XL TAKE 1 TABLET(150 MG) BY MOUTH DAILY   carbidopa-levodopa 25-100 MG tablet Commonly known as: SINEMET IR Take by mouth. Take 1 tablet by mouth 4 (four) times daily   cholecalciferol 25 MCG (1000 UNIT) tablet Commonly known as: VITAMIN D3 Take 1,000 Units by mouth daily.   ferrous sulfate 325 (65 FE) MG tablet Take 325 mg by mouth daily with breakfast.   meloxicam 7.5 MG tablet Commonly known as: MOBIC Take by mouth.   metFORMIN 500 MG 24 hr tablet Commonly known as: GLUCOPHAGE-XR Take 1 tablet (500 mg total) by mouth 2 (two) times daily with a meal. TAKE 1 TABLET(500 MG) BY MOUTH BID What changed: when to take this   Myrbetriq 50 MG Tb24 tablet Generic drug: mirabegron ER TAKE 1 TABLET(50 MG) BY MOUTH DAILY   Trospium Chloride 60 MG Cp24 TAKE 1 CAPSULE(60 MG) BY MOUTH DAILY   vitamin B-12 500 MCG tablet Commonly known as: CYANOCOBALAMIN Take 500 mcg by mouth daily.        Allergies:  No Known Allergies  Family History: Family History  Problem Relation Age of Onset   Stroke Mother    Hypertension Mother    Diabetes Father    Heart disease Father    Lung cancer Father    Colon cancer Sister    Diabetes Sister    Kidney disease Daughter    Kidney cancer Daughter    Pulmonary embolism Sister    Diabetes Sister    Kidney disease Sister    Heart disease Sister    Atrial fibrillation Sister    Diabetes Brother    Heart disease Brother    AAA (abdominal aortic aneurysm) Brother    Cancer Brother        skin    Social History:   reports that she quit smoking about 2 years ago. Her smoking use included cigarettes. She has a 80.00 pack-year smoking history. She has never used smokeless tobacco. She reports that she does not currently use alcohol.  She reports that she does not use drugs.  Physical Exam: BP (!) 143/82   Pulse 98   Ht '5\' 2"'  (1.575 m)   Wt 172 lb (78 kg)   BMI 31.46 kg/m   Constitutional:  Alert and oriented, no acute distress, nontoxic appearing HEENT: [place], AT Cardiovascular: No clubbing, cyanosis, or edema Respiratory: Normal respiratory effort, no increased work of breathing Skin: No rashes, bruises or suspicious lesions Neurologic: Grossly intact, no focal deficits, moving all 4 extremities Psychiatric: Normal mood and affect  Laboratory Data: Results for orders placed or performed in visit on 07/20/21  Bladder Scan (Post Void Residual) in office  Result Value Ref Range   Scan Result 69m    Assessment & Plan:   1. Overactive bladder Combination Myrbetriq 50 mg and trospium ER 60 mg daily is the most efficacious regimen she has yet tried.  We discussed that as she has previously failed Gemtesa and with concerns for other anticholinergics contributing to cognitive decline, her pharmaceutical options are limited at this point.    We again discussed alternative OAB therapies including InterStim, intravesical Botox, and PTNS.  She does not wish to pursue intravesical Botox or InterStim and she continues to have transportation difficulties which render PTNS infeasible.  Based on this, we will continue Myrbetriq and trospium with plans for annual follow-up.  I offered her a second opinion with Dr. MMatilde Sprang and she declined this. - Bladder Scan (Post Void Residual) in office  Return in about 1 year (around 07/20/2022) for Symptom recheck with PVR.  SDebroah Loop PA-C  [facility] [address] [place] [phone]

## 2021-07-20 NOTE — ED Triage Notes (Addendum)
Pt arrives via [facility] from [facility]. Ems reports unwitnessed mechanical fall this morning. Skin tear to left forearm, abrasion carpet burn over rt eye and on nose. A&o x 4. Ems reports pt took benadryl last night and still feels groggy at this time. Pt denies blood thinners, and blurred vision. NAD noted at this time  Ems vitals BP 146/85 80 hr

## 2021-07-20 NOTE — ED Provider Notes (Signed)
[facility] Emergency Department Provider Note  ____________________________________________   Event Date/Time   First MD Initiated Contact with Patient 07/20/21 [phone]     (approximate)  I have reviewed the triage vital signs and the nursing notes.   HISTORY  Chief Complaint Fall    HPI Whitney [name] is a 80 y.o. female presents emergency department after a unwitnessed fall at [facility].  Patient states that she fell hitting her left arm on the basket of her scooter, landing on the right arm and face.  Patient is complaining of facial abrasions, neck pain, no LOC.  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[place]) 07/20/2019   At high risk for falls 07/20/2019   Parkinson's disease ([place]) 01/15/2019   Hyperlipidemia 01/15/2019   Diabetes mellitus type 2 in obese ([place]) 01/13/2019   Overactive bladder 01/13/2019   Obesity (BMI 30.0-34.9) 01/13/2019    Past Surgical History:  Procedure Laterality Date   [place]   post hysterectomy   NECK SURGERY     anterior cervical disc fusion   THYROIDECTOMY Left 08/03/2020   Procedure: THYROIDECTOMY-HEMI;  Surgeon: Clyde Canterbury, MD;  Location: [facility] ORS;  Service: ENT;  Laterality: Left;    Prior to Admission medications   Medication Sig Start Date End Date Taking? Authorizing Provider  ACCU-CHEK GUIDE test strip  04/27/20   [provider]  Accu-Chek Softclix  Lancets lancets  04/27/20   [provider]  acetaminophen (TYLENOL) 500 MG tablet Take 500-1,000 mg by mouth every 6 (six) hours as needed (for pain.).    [provider]  Alcohol Swabs (B-D SINGLE USE SWABS REGULAR) PADS  04/27/20   [provider]  Blood Glucose Monitoring Suppl (ACCU-CHEK GUIDE ME) w/Device KIT  04/27/20   [provider]  buPROPion (WELLBUTRIN XL) 150 MG 24 hr tablet TAKE 1 TABLET(150 MG) BY MOUTH DAILY 04/01/20   Delsa Grana, PA-C  carbidopa-levodopa (SINEMET IR) 25-100 MG tablet Take by mouth. Take 1 tablet by mouth 4 (four) times daily 09/28/20 09/28/21  [provider]  cholecalciferol (VITAMIN D3) 25 MCG (1000 UNIT) tablet Take 1,000 Units by mouth daily.     [provider]  ferrous sulfate 325 (65 FE) MG tablet Take 325 mg by mouth daily with breakfast.     [provider]  meloxicam (MOBIC) 7.5 MG tablet Take by mouth. Patient not taking: Reported on 07/14/2021 03/16/21 03/16/22  [provider]  metFORMIN (GLUCOPHAGE-XR) 500 MG 24 hr tablet Take 1 tablet (500 mg total) by mouth 2 (two) times daily with a meal. TAKE 1 TABLET(500 MG) BY MOUTH BID Patient taking differently: Take 500 mg by mouth in the morning, at noon, and at bedtime. TAKE 1 TABLET(500 MG) BY MOUTH BID 09/01/20   Delsa Grana, PA-C  [facility]  50 MG TB24 tablet TAKE 1 TABLET(50 MG) BY MOUTH DAILY 06/26/21   Vaillancourt, Aldona Bar, PA-C  Trospium Chloride 60 MG CP24 TAKE 1 CAPSULE(60 MG) BY MOUTH DAILY 06/26/21   Vaillancourt, Aldona Bar, PA-C  vitamin B-12 (CYANOCOBALAMIN) 500 MCG tablet Take 500 mcg by mouth daily.    [provider]    Allergies Patient has no known allergies.  Family History  Problem Relation Age of Onset   Stroke Mother    Hypertension Mother    Diabetes Father    Heart disease Father    Lung cancer Father    Colon cancer Sister    Diabetes Sister    Kidney disease Daughter    Kidney cancer Daughter     Pulmonary embolism Sister    Diabetes Sister    Kidney disease Sister    Heart disease Sister    Atrial fibrillation Sister    Diabetes Brother    Heart disease Brother    AAA (abdominal aortic aneurysm) Brother    Cancer Brother        skin    Social History Social History   Tobacco Use   Smoking status: Former    Packs/day: 2.00    Years: 40.00    Pack years: 80.00    Types: Cigarettes    Quit date: 12/10/2018    Years since quitting: 2.6   Smokeless tobacco: Never  Vaping Use   Vaping Use: Never used  Substance Use Topics   Alcohol use: Not Currently   Drug use: Never    Review of Systems  Constitutional: No fever/chills Eyes: No visual changes. ENT: No sore throat. Respiratory: Denies cough Cardiovascular: Denies chest pain Gastrointestinal: Denies abdominal pain Genitourinary: Negative for dysuria. Musculoskeletal: Negative for back pain.  Positive right shoulder pain Skin: Negative for rash. Psychiatric: no mood changes,     ____________________________________________   PHYSICAL EXAM:  VITAL SIGNS: ED Triage Vitals  Enc Vitals Group     BP 07/20/21 0656 135/65     Pulse Rate 07/20/21 0656 97     Resp 07/20/21 0656 18     Temp 07/20/21 0656 98.1 F (36.7 C)     Temp Source 07/20/21 0656 Oral     SpO2 07/20/21 0656 97 %     Weight 07/20/21 0658 167 lb (75.8 kg)     Height 07/20/21 0658 _0  (1.575 m)     Head Circumference --      Peak Flow --      Pain Score 07/20/21 0657 6     Pain Loc --      Pain Edu? --      Excl. in [place]? --     Constitutional: Alert and oriented. Well appearing and in no acute distress. Eyes: Conjunctivae are normal.  Head: Facial abrasions noted Nose: No congestion/rhinnorhea. Mouth/Throat: Mucous membranes are moist.   Neck:  supple no lymphadenopathy noted Cardiovascular: Normal rate, regular rhythm. Heart sounds are normal Respiratory: Normal respiratory effort.  No retractions, lungs c t a  Abd: soft  nontender bs normal all 4 quad GU: deferred Musculoskeletal: Right shoulder and upper humerus tender to palpation, grips equal bilaterally, neurovascular intact, C-spine is tender Neurologic:  Normal speech and language.  Skin:  Skin is warm, dry and intact. No rash noted. Psychiatric: Mood and affect are normal. Speech and behavior are normal.  ____________________________________________   LABS (all labs ordered are listed, but only abnormal results are displayed)  Labs Reviewed - No data to display  ____________________________________________   ____________________________________________  RADIOLOGY  CT of the head maxillofacial and C-spine X-ray of the right shoulder  ____________________________________________   PROCEDURES  Procedure(s) performed:   Marland KitchenMarland KitchenLaceration Repair  Date/Time: 07/20/2021 7:49 AM Performed by: Versie Starks, PA-C Authorized by: Versie Starks, PA-C   Consent:    Consent obtained:  Verbal   Consent given by:  Patient   Risks discussed:  Infection, pain, retained foreign body, need for additional repair, poor cosmetic result, poor wound healing, tendon damage, vascular damage and nerve damage Universal protocol:    Procedure explained and questions answered to patient or proxy's satisfaction: yes     Patient identity confirmed:  Verbally with patient Anesthesia:    Anesthesia method:  None Laceration details:    Location:  Shoulder/arm   Shoulder/arm location:  L lower arm   Length (cm):  6 Exploration:    Hemostasis achieved with:  Direct pressure   Imaging outcome: foreign body not noted     Wound exploration: wound explored through full range of motion     Wound extent: no fascia violation noted, no foreign bodies/material noted, no muscle damage noted, no nerve damage noted, no tendon damage noted, no underlying fracture noted and no vascular damage noted     Contaminated: no   Treatment:    Area cleansed with:  Saline   Amount of  cleaning:  Standard   Irrigation solution:  Sterile saline   Irrigation method:  Tap   Debridement:  None Skin repair:    Repair method:  Tissue adhesive Approximation:    Approximation:  Close Repair type:    Repair type:  Simple Post-procedure details:    Dressing:  Open (no dressing)    ____________________________________________   INITIAL IMPRESSION / ASSESSMENT AND PLAN / ED COURSE  Pertinent labs & imaging results that were available during my care of the patient were reviewed by me and considered in my medical decision making (see chart for details).   Patient 80 year old female presents emergency department after a fall.  See HPI physical exam shows patient appears stable  DDx: Facial fracture, SAH, subdural, cervical fracture, shoulder dislocation/fracture, skin tear  See procedure note for skin tear repair  CT of the head, maxillofacial, C-spine Xray of the right shoulder  CT of the head, maxillofacial, C-spine are negative for any acute abnormalities X-ray of the right shoulder reviewed by me confirmed by radiology to be negative for any acute abnormality   Findings with patient.  She will be discharged and transported by EMS back [facility].  Instructions on how to care for Dermabond were given.  She is to return if worsening.  Also instructed her to decrease the amount of Benadryl she takes at night.  I feel that 2 Benadryl is too much for her and is causing her to fall.  Whitney [name] was evaluated in Emergency Department on 07/20/2021 for the symptoms described in the history of present illness. She was evaluated in the context of the global COVID-19 pandemic, which necessitated consideration that the patient might be at risk for infection with the SARS-CoV-2 virus that causes COVID-19. Institutional protocols and algorithms that pertain to the evaluation of patients at risk for COVID-19 are in a state of rapid change based on information released by  regulatory bodies including the CDC and federal and state organizations. These policies and algorithms were followed during the patient's care in the ED.    As part of my medical decision making, I  reviewed the following data within the [place] notes reviewed and incorporated, Old chart reviewed, Radiograph reviewed , Notes from prior ED visits, and [place] Controlled Substance Database  ____________________________________________   FINAL CLINICAL IMPRESSION(S) / ED DIAGNOSES  Final diagnoses:  Fall, initial encounter  Contusion of face, initial encounter  Skin tear of left forearm without complication, initial encounter      NEW MEDICATIONS STARTED DURING THIS VISIT:  New Prescriptions   No medications on file     Note:  This document was prepared using Dragon voice recognition software and may include unintentional dictation errors.    Versie Starks, PA-C 07/20/21 1962    Harvest Dark, MD 07/20/21 1409

## 2021-07-21 ENCOUNTER — Encounter (INDEPENDENT_AMBULATORY_CARE_PROVIDER_SITE_OTHER): Payer: Self-pay

## 2021-07-21 ENCOUNTER — Ambulatory Visit (INDEPENDENT_AMBULATORY_CARE_PROVIDER_SITE_OTHER): Payer: Medicare PPO | Admitting: Nurse Practitioner

## 2021-07-21 VITALS — BP 130/79 | HR 92 | Resp 16

## 2021-07-21 DIAGNOSIS — M7989 Other specified soft tissue disorders: Secondary | ICD-10-CM

## 2021-07-21 DIAGNOSIS — R600 Localized edema: Secondary | ICD-10-CM | POA: Diagnosis not present

## 2021-07-21 NOTE — Progress Notes (Signed)
History of Present Illness  There is no documented history at this time  Assessments & Plan   There are no diagnoses linked to this encounter.    Additional instructions  Subjective:  Patient presents with venous ulcer of the Bilateral lower extremity.    Procedure:  3 layer unna wrap was placed Bilateral lower extremity.   Plan:   Follow up in one week.  

## 2021-07-24 ENCOUNTER — Encounter (INDEPENDENT_AMBULATORY_CARE_PROVIDER_SITE_OTHER): Payer: Self-pay | Admitting: Nurse Practitioner

## 2021-07-25 ENCOUNTER — Encounter (INDEPENDENT_AMBULATORY_CARE_PROVIDER_SITE_OTHER): Payer: Self-pay | Admitting: Nurse Practitioner

## 2021-07-27 ENCOUNTER — Other Ambulatory Visit: Payer: Self-pay

## 2021-07-27 ENCOUNTER — Ambulatory Visit (INDEPENDENT_AMBULATORY_CARE_PROVIDER_SITE_OTHER): Payer: Medicare PPO | Admitting: Nurse Practitioner

## 2021-07-27 ENCOUNTER — Encounter (INDEPENDENT_AMBULATORY_CARE_PROVIDER_SITE_OTHER): Payer: Self-pay

## 2021-07-27 VITALS — BP 135/79 | HR 96 | Resp 16 | Ht 62.0 in | Wt 172.0 lb

## 2021-07-27 DIAGNOSIS — R600 Localized edema: Secondary | ICD-10-CM | POA: Diagnosis not present

## 2021-07-27 DIAGNOSIS — M7989 Other specified soft tissue disorders: Secondary | ICD-10-CM

## 2021-07-27 NOTE — Progress Notes (Signed)
History of Present Illness  There is no documented history at this time  Assessments & Plan   There are no diagnoses linked to this encounter.    Additional instructions  Subjective:  Patient presents with venous ulcer of the Bilateral lower extremity.    Procedure:  3 layer unna wrap was placed Bilateral lower extremity.   Plan:   Follow up in one week.  

## 2021-07-28 ENCOUNTER — Encounter (INDEPENDENT_AMBULATORY_CARE_PROVIDER_SITE_OTHER)

## 2021-08-03 ENCOUNTER — Ambulatory Visit (INDEPENDENT_AMBULATORY_CARE_PROVIDER_SITE_OTHER): Payer: Medicare PPO | Admitting: Nurse Practitioner

## 2021-08-03 ENCOUNTER — Other Ambulatory Visit: Payer: Self-pay

## 2021-08-03 VITALS — BP 132/82 | HR 99 | Ht 62.0 in | Wt 170.0 lb

## 2021-08-03 DIAGNOSIS — R600 Localized edema: Secondary | ICD-10-CM

## 2021-08-03 DIAGNOSIS — M7989 Other specified soft tissue disorders: Secondary | ICD-10-CM

## 2021-08-03 NOTE — Progress Notes (Signed)
History of Present Illness  There is no documented history at this time  Assessments & Plan   There are no diagnoses linked to this encounter.    Additional instructions  Subjective:  Patient presents with venous ulcer of the Bilateral lower extremity.    Procedure:  3 layer unna wrap was placed Bilateral lower extremity.   Plan:   Follow up in one week.  

## 2021-08-04 ENCOUNTER — Encounter (INDEPENDENT_AMBULATORY_CARE_PROVIDER_SITE_OTHER)

## 2021-08-07 ENCOUNTER — Encounter (INDEPENDENT_AMBULATORY_CARE_PROVIDER_SITE_OTHER): Payer: Self-pay | Admitting: Nurse Practitioner

## 2021-08-09 ENCOUNTER — Other Ambulatory Visit (INDEPENDENT_AMBULATORY_CARE_PROVIDER_SITE_OTHER): Payer: Self-pay | Admitting: Nurse Practitioner

## 2021-08-09 DIAGNOSIS — M7989 Other specified soft tissue disorders: Secondary | ICD-10-CM

## 2021-08-11 ENCOUNTER — Ambulatory Visit (INDEPENDENT_AMBULATORY_CARE_PROVIDER_SITE_OTHER): Payer: Medicare PPO | Admitting: Nurse Practitioner

## 2021-08-11 ENCOUNTER — Ambulatory Visit (INDEPENDENT_AMBULATORY_CARE_PROVIDER_SITE_OTHER): Payer: Medicare PPO

## 2021-08-11 ENCOUNTER — Other Ambulatory Visit: Payer: Self-pay

## 2021-08-11 VITALS — BP 136/77 | HR 103 | Ht 62.0 in | Wt 160.0 lb

## 2021-08-11 DIAGNOSIS — M7989 Other specified soft tissue disorders: Secondary | ICD-10-CM

## 2021-08-11 DIAGNOSIS — E782 Mixed hyperlipidemia: Secondary | ICD-10-CM

## 2021-08-11 DIAGNOSIS — R600 Localized edema: Secondary | ICD-10-CM | POA: Diagnosis not present

## 2021-08-11 DIAGNOSIS — E1169 Type 2 diabetes mellitus with other specified complication: Secondary | ICD-10-CM

## 2021-08-11 DIAGNOSIS — E669 Obesity, unspecified: Secondary | ICD-10-CM | POA: Diagnosis not present

## 2021-08-14 ENCOUNTER — Encounter (INDEPENDENT_AMBULATORY_CARE_PROVIDER_SITE_OTHER): Payer: Self-pay | Admitting: Nurse Practitioner

## 2021-08-14 NOTE — Progress Notes (Signed)
Subjective:    Patient ID: Whitney [name], female    DOB: 1941-03-18, 80 y.o.   MRN: [id number] Chief Complaint  Patient presents with   Follow-up    4 wk BIL Unna boot check an [place]    Whitney [name] is a 80 year old female that returns to the office for followup evaluation regarding leg swelling.  The swelling has improved quite a bit and the pain associated with swelling has decreased substantially. There have not been any interval development of a ulcerations or wounds.  Patient has been in [place] wraps for the last 4 weeks and this has been very helpful in getting her swelling under control.   The patient also states elevation during the day and exercise is being done too.   Today the patient had noninvasive studies which shows no evidence of DVT or superficial thrombophlebitis bilaterally.  No evidence of deep venous insufficiency or superficial venous reflux bilaterally.     Review of Systems  Cardiovascular:  Positive for leg swelling.  All other systems reviewed and are negative.     Objective:   Physical Exam Vitals reviewed.  HENT:     Head: Normocephalic.  Cardiovascular:     Rate and Rhythm: Normal rate.     Pulses: Normal pulses.  Pulmonary:     Effort: Pulmonary effort is normal.  Musculoskeletal:     Right lower leg: No edema.     Left lower leg: No edema.  Skin:    General: Skin is warm and dry.  Neurological:     Mental Status: She is alert and oriented to person, place, and time.  Psychiatric:        Mood and Affect: Mood normal.        Behavior: Behavior normal.        Thought Content: Thought content normal.        Judgment: Judgment normal.    BP 136/77   Pulse (!) 103   Ht '5\' 2"'  (1.575 m)   Wt 160 lb (72.6 kg)   BMI 29.26 kg/m   Past Medical History:  Diagnosis Date   Anemia    Arthritis    Broken shoulder    left humerus    COVID-19    Depression    Diabetes mellitus without complication (HCC)    Goiter    Hyperlipidemia     Overactive bladder    Parkinson's disease (HCC)    Pneumonia    Right arm weakness    limited movement   Unsteady gait     Social History   Socioeconomic History   Marital status: Widowed    Spouse name: Jeneen Rinks   Number of children: 2   Years of education: 12   Highest education level: Some college, no degree  Occupational History   Occupation: retired school system  Tobacco Use   Smoking status: Former    Packs/day: 2.00    Years: 40.00    Pack years: 80.00    Types: Cigarettes    Quit date: 12/10/2018    Years since quitting: 2.6   Smokeless tobacco: Never  Vaping Use   Vaping Use: Never used  Substance and Sexual Activity   Alcohol use: Not Currently   Drug use: Never   Sexual activity: Not Currently  Other Topics Concern   Not on file  Social History Narrative   Lost her husband of 23 years in oct 2019. Moved back from [place] in Nov 2019   Social Determinants of  Health   Financial Resource Strain: Not on file  Food Insecurity: Not on file  Transportation Needs: Not on file  Physical Activity: Not on file  Stress: Not on file  Social Connections: Not on file  Intimate Partner Violence: Not on file    Past Surgical History:  Procedure Laterality Date   [place]   post hysterectomy   NECK SURGERY     anterior cervical disc fusion   THYROIDECTOMY Left 08/03/2020   Procedure: THYROIDECTOMY-HEMI;  Surgeon: Clyde Canterbury, MD;  Location: [facility] ORS;  Service: ENT;  Laterality: Left;    Family History  Problem Relation Age of Onset   Stroke Mother    Hypertension Mother    Diabetes Father    Heart disease Father    Lung cancer Father    Colon cancer Sister    Diabetes Sister    Kidney disease Daughter    Kidney cancer Daughter    Pulmonary embolism Sister    Diabetes Sister    Kidney disease Sister    Heart disease Sister    Atrial fibrillation Sister    Diabetes Brother    Heart disease Brother    AAA  (abdominal aortic aneurysm) Brother    Cancer Brother        skin    No Known Allergies  CBC Latest Ref Rng & Units 12/20/2020 10/11/2020 09/18/2020  WBC 4.0 - 10.5 K/uL 3.7(L) 3.2(L) 3.5(L)  Hemoglobin 12.0 - 15.0 g/dL 12.3 10.5(L) 9.5(L)  Hematocrit 36.0 - 46.0 % 38.1 33.1(L) 30.5(L)  Platelets 150 - 400 K/uL 107(L) 142(L) 131(L)      CMP     Component Value Date/Time   NA 138 09/18/2020 1259   K 4.1 09/18/2020 1259   CL 103 09/18/2020 1259   CO2 25 09/18/2020 1259   GLUCOSE 247 (H) 09/18/2020 1259   BUN 13 09/18/2020 1259   CREATININE 0.61 09/18/2020 1259   CREATININE 0.58 (L) 09/13/2020 1023   CALCIUM 8.8 (L) 09/18/2020 1259   PROT 6.6 09/18/2020 1259   ALBUMIN 3.5 09/18/2020 1259   AST 35 09/18/2020 1259   ALT 25 09/18/2020 1259   ALKPHOS 112 09/18/2020 1259   BILITOT 0.6 09/18/2020 1259   GFRNONAA >60 09/18/2020 1259   GFRNONAA 88 09/13/2020 1023   GFRAA 102 09/13/2020 1023     No results found.     Assessment & Plan:   1. Leg swelling No surgery or intervention at this point in time.    I have reviewed my discussion with the patient regarding venous insufficiency and  lymph edema and why it  causes symptoms. I have discussed with the patient the chronic skin changes that accompany these problems and the long term sequela such as ulceration and infection.  Patient will continue wearing graduated compression stockings class 1 (20-30 mmHg) on a daily basis a prescription was given to the patient to keep this updated. The patient will  put the stockings on first thing in the morning and removing them in the evening. The patient is instructed specifically not to sleep in the stockings.  In addition, behavioral modification including elevation during the day will be continued.  Diet and salt restriction was also discussed.  Previous duplex ultrasound of the lower extremities shows normal deep venous system, superficial reflux was not present.   Following the review  of the ultrasound the patient will follow up in 3 months to reassess the degree of swelling  and the control that graduated compression is offering.   The patient can be assessed for a Lymph Pump at that time.   2. Diabetes mellitus type 2 in obese [facility]) Continue hypoglycemic medications as already ordered, these medications have been reviewed and there are no changes at this time.  Hgb A1C to be monitored as already arranged by primary service   3. Mixed hyperlipidemia Continue statin as ordered and reviewed, no changes at this time    Current Outpatient Medications on File Prior to Visit  Medication Sig Dispense Refill   ACCU-CHEK GUIDE test strip      Accu-Chek Softclix Lancets lancets      acetaminophen (TYLENOL) 500 MG tablet Take 500-1,000 mg by mouth every 6 (six) hours as needed (for pain.).     Alcohol Swabs (B-D SINGLE USE SWABS REGULAR) PADS      Blood Glucose Monitoring Suppl (ACCU-CHEK GUIDE ME) w/Device KIT      buPROPion (WELLBUTRIN XL) 150 MG 24 hr tablet TAKE 1 TABLET(150 MG) BY MOUTH DAILY 90 tablet 1   carbidopa-levodopa (SINEMET IR) 25-100 MG tablet Take by mouth. Take 1 tablet by mouth 4 (four) times daily     cholecalciferol (VITAMIN D3) 25 MCG (1000 UNIT) tablet Take 1,000 Units by mouth daily.      ferrous sulfate 325 (65 FE) MG tablet Take 325 mg by mouth daily with breakfast.      meloxicam (MOBIC) 7.5 MG tablet Take by mouth.     metFORMIN (GLUCOPHAGE-XR) 500 MG 24 hr tablet Take 1 tablet (500 mg total) by mouth 2 (two) times daily with a meal. TAKE 1 TABLET(500 MG) BY MOUTH BID (Patient taking differently: Take 500 mg by mouth in the morning, at noon, and at bedtime. TAKE 1 TABLET(500 MG) BY MOUTH BID) 180 tablet 3   MYRBETRIQ 50 MG TB24 tablet TAKE 1 TABLET(50 MG) BY MOUTH DAILY 30 tablet 11   Trospium Chloride 60 MG CP24 TAKE 1 CAPSULE(60 MG) BY MOUTH DAILY 90 capsule 0   vitamin B-12 (CYANOCOBALAMIN) 500 MCG tablet Take 500 mcg by mouth daily.     No current  facility-administered medications on file prior to visit.    There are no Patient Instructions on file for this visit. No follow-ups on file.   Kris Hartmann, NP

## 2021-09-13 DIAGNOSIS — D696 Thrombocytopenia, unspecified: Secondary | ICD-10-CM | POA: Insufficient documentation

## 2021-09-18 ENCOUNTER — Other Ambulatory Visit: Payer: Self-pay | Admitting: Physician Assistant

## 2021-09-18 DIAGNOSIS — N3281 Overactive bladder: Secondary | ICD-10-CM

## 2021-11-17 ENCOUNTER — Ambulatory Visit (INDEPENDENT_AMBULATORY_CARE_PROVIDER_SITE_OTHER): Admitting: Vascular Surgery

## 2021-11-21 ENCOUNTER — Ambulatory Visit (INDEPENDENT_AMBULATORY_CARE_PROVIDER_SITE_OTHER): Admitting: Vascular Surgery

## 2021-12-05 ENCOUNTER — Other Ambulatory Visit: Payer: Self-pay | Admitting: Internal Medicine

## 2021-12-05 DIAGNOSIS — R269 Unspecified abnormalities of gait and mobility: Secondary | ICD-10-CM

## 2021-12-05 DIAGNOSIS — G20 Parkinson's disease: Secondary | ICD-10-CM

## 2021-12-20 ENCOUNTER — Ambulatory Visit: Payer: Medicare PPO

## 2021-12-26 ENCOUNTER — Other Ambulatory Visit: Payer: Self-pay

## 2021-12-26 ENCOUNTER — Emergency Department: Payer: Medicare PPO

## 2021-12-26 ENCOUNTER — Observation Stay: Payer: Medicare PPO

## 2021-12-26 ENCOUNTER — Inpatient Hospital Stay
Admission: EM | Admit: 2021-12-26 | Discharge: 2021-12-30 | DRG: 871 | Disposition: A | Discharge status: Home Health | Payer: Medicare PPO | Attending: Internal Medicine | Admitting: Internal Medicine

## 2021-12-26 DIAGNOSIS — A419 Sepsis, unspecified organism: Principal | ICD-10-CM | POA: Diagnosis present

## 2021-12-26 DIAGNOSIS — R710 Precipitous drop in hematocrit: Secondary | ICD-10-CM

## 2021-12-26 DIAGNOSIS — K529 Noninfective gastroenteritis and colitis, unspecified: Secondary | ICD-10-CM | POA: Diagnosis present

## 2021-12-26 DIAGNOSIS — E669 Obesity, unspecified: Secondary | ICD-10-CM | POA: Diagnosis present

## 2021-12-26 DIAGNOSIS — Z833 Family history of diabetes mellitus: Secondary | ICD-10-CM

## 2021-12-26 DIAGNOSIS — N3001 Acute cystitis with hematuria: Secondary | ICD-10-CM

## 2021-12-26 DIAGNOSIS — K766 Portal hypertension: Secondary | ICD-10-CM | POA: Diagnosis present

## 2021-12-26 DIAGNOSIS — Z8249 Family history of ischemic heart disease and other diseases of the circulatory system: Secondary | ICD-10-CM

## 2021-12-26 DIAGNOSIS — G20 Parkinson's disease: Secondary | ICD-10-CM | POA: Diagnosis present

## 2021-12-26 DIAGNOSIS — Z79899 Other long term (current) drug therapy: Secondary | ICD-10-CM

## 2021-12-26 DIAGNOSIS — D62 Acute posthemorrhagic anemia: Secondary | ICD-10-CM | POA: Diagnosis not present

## 2021-12-26 DIAGNOSIS — Z6832 Body mass index (BMI) 32.0-32.9, adult: Secondary | ICD-10-CM

## 2021-12-26 DIAGNOSIS — E119 Type 2 diabetes mellitus without complications: Secondary | ICD-10-CM

## 2021-12-26 DIAGNOSIS — F32A Depression, unspecified: Secondary | ICD-10-CM

## 2021-12-26 DIAGNOSIS — K921 Melena: Secondary | ICD-10-CM | POA: Diagnosis present

## 2021-12-26 DIAGNOSIS — J189 Pneumonia, unspecified organism: Secondary | ICD-10-CM | POA: Diagnosis present

## 2021-12-26 DIAGNOSIS — Z87891 Personal history of nicotine dependence: Secondary | ICD-10-CM

## 2021-12-26 DIAGNOSIS — Z789 Other specified health status: Secondary | ICD-10-CM | POA: Diagnosis present

## 2021-12-26 DIAGNOSIS — E559 Vitamin D deficiency, unspecified: Secondary | ICD-10-CM | POA: Diagnosis present

## 2021-12-26 DIAGNOSIS — R6520 Severe sepsis without septic shock: Secondary | ICD-10-CM

## 2021-12-26 DIAGNOSIS — Z823 Family history of stroke: Secondary | ICD-10-CM

## 2021-12-26 DIAGNOSIS — Z9181 History of falling: Secondary | ICD-10-CM

## 2021-12-26 DIAGNOSIS — R1013 Epigastric pain: Secondary | ICD-10-CM

## 2021-12-26 DIAGNOSIS — E1165 Type 2 diabetes mellitus with hyperglycemia: Secondary | ICD-10-CM

## 2021-12-26 DIAGNOSIS — K3189 Other diseases of stomach and duodenum: Secondary | ICD-10-CM | POA: Diagnosis present

## 2021-12-26 DIAGNOSIS — K7460 Unspecified cirrhosis of liver: Secondary | ICD-10-CM | POA: Diagnosis present

## 2021-12-26 DIAGNOSIS — Z800 Family history of malignant neoplasm of digestive organs: Secondary | ICD-10-CM

## 2021-12-26 DIAGNOSIS — I8500 Esophageal varices without bleeding: Secondary | ICD-10-CM

## 2021-12-26 DIAGNOSIS — G20A1 Parkinson's disease without dyskinesia, without mention of fluctuations: Secondary | ICD-10-CM | POA: Diagnosis present

## 2021-12-26 DIAGNOSIS — K2971 Gastritis, unspecified, with bleeding: Secondary | ICD-10-CM | POA: Diagnosis present

## 2021-12-26 DIAGNOSIS — Z841 Family history of disorders of kidney and ureter: Secondary | ICD-10-CM

## 2021-12-26 DIAGNOSIS — K449 Diaphragmatic hernia without obstruction or gangrene: Secondary | ICD-10-CM | POA: Diagnosis present

## 2021-12-26 DIAGNOSIS — Z8051 Family history of malignant neoplasm of kidney: Secondary | ICD-10-CM

## 2021-12-26 DIAGNOSIS — I8510 Secondary esophageal varices without bleeding: Secondary | ICD-10-CM | POA: Diagnosis present

## 2021-12-26 DIAGNOSIS — Z801 Family history of malignant neoplasm of trachea, bronchus and lung: Secondary | ICD-10-CM

## 2021-12-26 DIAGNOSIS — D61818 Other pancytopenia: Secondary | ICD-10-CM | POA: Diagnosis present

## 2021-12-26 DIAGNOSIS — R4781 Slurred speech: Secondary | ICD-10-CM | POA: Diagnosis present

## 2021-12-26 DIAGNOSIS — Z7984 Long term (current) use of oral hypoglycemic drugs: Secondary | ICD-10-CM

## 2021-12-26 DIAGNOSIS — R8281 Pyuria: Secondary | ICD-10-CM | POA: Diagnosis present

## 2021-12-26 DIAGNOSIS — R531 Weakness: Secondary | ICD-10-CM

## 2021-12-26 DIAGNOSIS — E1169 Type 2 diabetes mellitus with other specified complication: Secondary | ICD-10-CM | POA: Diagnosis present

## 2021-12-26 DIAGNOSIS — B9689 Other specified bacterial agents as the cause of diseases classified elsewhere: Secondary | ICD-10-CM | POA: Diagnosis present

## 2021-12-26 DIAGNOSIS — Z8616 Personal history of COVID-19: Secondary | ICD-10-CM

## 2021-12-26 DIAGNOSIS — Z1619 Resistance to other specified beta lactam antibiotics: Secondary | ICD-10-CM | POA: Diagnosis present

## 2021-12-26 DIAGNOSIS — K7581 Nonalcoholic steatohepatitis (NASH): Secondary | ICD-10-CM | POA: Diagnosis present

## 2021-12-26 DIAGNOSIS — Z7409 Other reduced mobility: Secondary | ICD-10-CM | POA: Diagnosis present

## 2021-12-26 DIAGNOSIS — N3281 Overactive bladder: Secondary | ICD-10-CM | POA: Diagnosis present

## 2021-12-26 DIAGNOSIS — D509 Iron deficiency anemia, unspecified: Secondary | ICD-10-CM

## 2021-12-26 DIAGNOSIS — E872 Acidosis, unspecified: Secondary | ICD-10-CM

## 2021-12-26 LAB — CBC
HCT: 34 % — ABNORMAL LOW (ref 36.0–46.0)
Hemoglobin: 10.8 g/dL — ABNORMAL LOW (ref 12.0–15.0)
MCH: 27.8 pg (ref 26.0–34.0)
MCHC: 31.8 g/dL (ref 30.0–36.0)
MCV: 87.6 fL (ref 80.0–100.0)
Platelets: 157 10*3/uL (ref 150–400)
RBC: 3.88 MIL/uL (ref 3.87–5.11)
RDW: 16.4 % — ABNORMAL HIGH (ref 11.5–15.5)
WBC: 6.9 10*3/uL (ref 4.0–10.5)
nRBC: 0 % (ref 0.0–0.2)

## 2021-12-26 LAB — COMPREHENSIVE METABOLIC PANEL
ALT: 29 U/L (ref 0–44)
AST: 45 U/L — ABNORMAL HIGH (ref 15–41)
Albumin: 3.2 g/dL — ABNORMAL LOW (ref 3.5–5.0)
Alkaline Phosphatase: 91 U/L (ref 38–126)
Anion gap: 14 (ref 5–15)
BUN: 34 mg/dL — ABNORMAL HIGH (ref 8–23)
CO2: 19 mmol/L — ABNORMAL LOW (ref 22–32)
Calcium: 9.5 mg/dL (ref 8.9–10.3)
Chloride: 105 mmol/L (ref 98–111)
Creatinine, Ser: 0.62 mg/dL (ref 0.44–1.00)
GFR, Estimated: >60 mL/min (ref >60)
Glucose, Bld: 103 mg/dL — ABNORMAL HIGH (ref 70–99)
Potassium: 4.5 mmol/L (ref 3.5–5.1)
Sodium: 138 mmol/L (ref 135–145)
Total Bilirubin: 1.3 mg/dL — ABNORMAL HIGH (ref 0.3–1.2)
Total Protein: 6.5 g/dL (ref 6.5–8.1)

## 2021-12-26 LAB — URINALYSIS, COMPLETE (UACMP) WITH MICROSCOPIC
Bilirubin Urine: NEGATIVE
Glucose, UA: NEGATIVE mg/dL
Ketones, ur: 40 mg/dL — AB
Nitrite: POSITIVE — AB
Protein, ur: NEGATIVE mg/dL
Specific Gravity, Urine: 1.01 (ref 1.005–1.030)
Squamous Epithelial / HPF: NONE SEEN (ref 0–5)
WBC, UA: >50 WBC/hpf (ref 0–5)
pH: 5 (ref 5.0–8.0)

## 2021-12-26 LAB — PROCALCITONIN: Procalcitonin: <0.1 ng/mL

## 2021-12-26 LAB — TROPONIN I (HIGH SENSITIVITY)
Troponin I (High Sensitivity): 7 ng/L (ref <18)
Troponin I (High Sensitivity): 6 ng/L (ref <18)

## 2021-12-26 LAB — RESP PANEL BY RT-PCR (FLU A&B, COVID) ARPGX2
Influenza A by PCR: NEGATIVE
Influenza B by PCR: NEGATIVE
SARS Coronavirus 2 by RT PCR: NEGATIVE

## 2021-12-26 LAB — TSH: TSH: 1.91 u[IU]/mL (ref 0.350–4.500)

## 2021-12-26 LAB — LACTIC ACID, PLASMA
Lactic Acid, Venous: 5.6 mmol/L (ref 0.5–1.9)
Lactic Acid, Venous: 4.6 mmol/L (ref 0.5–1.9)

## 2021-12-26 LAB — PROTIME-INR
INR: 1.4 — ABNORMAL HIGH (ref 0.8–1.2)
Prothrombin Time: 16.7 seconds — ABNORMAL HIGH (ref 11.4–15.2)

## 2021-12-26 LAB — SALICYLATE LEVEL: Salicylate Lvl: <7 mg/dL — ABNORMAL LOW (ref 7.0–30.0)

## 2021-12-26 LAB — CBG MONITORING, ED: Glucose-Capillary: 116 mg/dL — ABNORMAL HIGH (ref 70–99)

## 2021-12-26 LAB — BRAIN NATRIURETIC PEPTIDE: B Natriuretic Peptide: 28.6 pg/mL (ref 0.0–100.0)

## 2021-12-26 MED ORDER — SODIUM CHLORIDE 0.9 % IV SOLN
500.0000 mg | INTRAVENOUS | Status: DC
  Administered 2021-12-27: 500 mg via INTRAVENOUS
  Filled 2021-12-26: qty 5
  Filled 2021-12-26: qty 500

## 2021-12-26 MED ORDER — HYDRALAZINE HCL 10 MG PO TABS
10.0000 mg | ORAL_TABLET | Freq: Four times a day (QID) | ORAL | Status: DC | PRN
  Filled 2021-12-26: qty 1

## 2021-12-26 MED ORDER — ONDANSETRON HCL 4 MG PO TABS: 4.0000 mg | ORAL_TABLET | Freq: Four times a day (QID) | ORAL | Status: DC | PRN

## 2021-12-26 MED ORDER — ACETAMINOPHEN 325 MG PO TABS
650.0000 mg | ORAL_TABLET | Freq: Four times a day (QID) | ORAL | Status: DC | PRN
  Administered 2021-12-28: 650 mg via ORAL
  Filled 2021-12-26: qty 2

## 2021-12-26 MED ORDER — AMANTADINE HCL 100 MG PO CAPS
100.0000 mg | ORAL_CAPSULE | Freq: Two times a day (BID) | ORAL | Status: DC
  Administered 2021-12-26 – 2021-12-30 (×7): 100 mg via ORAL
  Filled 2021-12-26 (×10): qty 1

## 2021-12-26 MED ORDER — SODIUM CHLORIDE 0.9 % IV BOLUS
500.0000 mL | Freq: Once | INTRAVENOUS | Status: AC
  Administered 2021-12-26: 500 mL via INTRAVENOUS

## 2021-12-26 MED ORDER — ONDANSETRON HCL 4 MG/2ML IJ SOLN
2.0000 mg | Freq: Once | INTRAMUSCULAR | Status: AC
  Administered 2021-12-26: 2 mg via INTRAVENOUS
  Filled 2021-12-26: qty 2

## 2021-12-26 MED ORDER — FERROUS SULFATE 325 (65 FE) MG PO TABS
325.0000 mg | ORAL_TABLET | Freq: Every day | ORAL | Status: DC
  Administered 2021-12-27 – 2021-12-30 (×4): 325 mg via ORAL
  Filled 2021-12-26 (×4): qty 1

## 2021-12-26 MED ORDER — ACETAMINOPHEN 650 MG RE SUPP: 650.0000 mg | Freq: Four times a day (QID) | RECTAL | Status: DC | PRN

## 2021-12-26 MED ORDER — ONDANSETRON HCL 4 MG/2ML IJ SOLN
4.0000 mg | Freq: Four times a day (QID) | INTRAMUSCULAR | Status: DC | PRN
  Administered 2021-12-27: 4 mg via INTRAVENOUS
  Filled 2021-12-26: qty 2

## 2021-12-26 MED ORDER — LACTATED RINGERS IV SOLN: INTRAVENOUS | Status: DC

## 2021-12-26 MED ORDER — BUPROPION HCL ER (XL) 150 MG PO TB24
150.0000 mg | ORAL_TABLET | Freq: Every day | ORAL | Status: DC
  Administered 2021-12-27 – 2021-12-30 (×4): 150 mg via ORAL
  Filled 2021-12-26 (×4): qty 1

## 2021-12-26 MED ORDER — SODIUM CHLORIDE 0.9 % IV SOLN
500.0000 mg | INTRAVENOUS | Status: DC
  Administered 2021-12-26: 500 mg via INTRAVENOUS
  Filled 2021-12-26: qty 5

## 2021-12-26 MED ORDER — METOPROLOL TARTRATE 5 MG/5ML IV SOLN
5.0000 mg | Freq: Once | INTRAVENOUS | Status: AC
  Administered 2021-12-26: 5 mg via INTRAVENOUS
  Filled 2021-12-26: qty 5

## 2021-12-26 MED ORDER — MIRABEGRON ER 50 MG PO TB24
50.0000 mg | ORAL_TABLET | Freq: Every day | ORAL | Status: DC
Start: 2021-12-26 — End: 2021-12-30
  Administered 2021-12-26 – 2021-12-30 (×4): 50 mg via ORAL
  Filled 2021-12-26 (×6): qty 1

## 2021-12-26 MED ORDER — ENOXAPARIN SODIUM 40 MG/0.4ML IJ SOSY
40.0000 mg | PREFILLED_SYRINGE | INTRAMUSCULAR | Status: DC
  Administered 2021-12-26: 40 mg via SUBCUTANEOUS
  Filled 2021-12-26: qty 0.4

## 2021-12-26 MED ORDER — INSULIN ASPART 100 UNIT/ML IJ SOLN
0.0000 [IU] | Freq: Every day | INTRAMUSCULAR | Status: DC
  Administered 2021-12-26: 0 [IU] via SUBCUTANEOUS

## 2021-12-26 MED ORDER — SODIUM CHLORIDE 0.9 % IV SOLN
2.0000 g | INTRAVENOUS | Status: DC
  Administered 2021-12-26: 2 g via INTRAVENOUS
  Filled 2021-12-26: qty 20

## 2021-12-26 MED ORDER — MORPHINE SULFATE (PF) 2 MG/ML IV SOLN: 1.0000 mg | INTRAVENOUS | Status: DC | PRN

## 2021-12-26 MED ORDER — IOHEXOL 350 MG/ML SOLN
75.0000 mL | Freq: Once | INTRAVENOUS | Status: AC | PRN
  Administered 2021-12-26: 75 mL via INTRAVENOUS

## 2021-12-26 MED ORDER — INSULIN ASPART 100 UNIT/ML IJ SOLN
0.0000 [IU] | Freq: Three times a day (TID) | INTRAMUSCULAR | Status: DC
  Administered 2021-12-27: 3 [IU] via SUBCUTANEOUS
  Administered 2021-12-27 (×2): 2 [IU] via SUBCUTANEOUS
  Administered 2021-12-28: 3 [IU] via SUBCUTANEOUS
  Administered 2021-12-28: 5 [IU] via SUBCUTANEOUS
  Administered 2021-12-28: 2 [IU] via SUBCUTANEOUS
  Administered 2021-12-29 (×2): 5 [IU] via SUBCUTANEOUS
  Administered 2021-12-29: 2 [IU] via SUBCUTANEOUS
  Administered 2021-12-30 (×2): 3 [IU] via SUBCUTANEOUS
  Filled 2021-12-26 (×10): qty 1

## 2021-12-26 MED ORDER — MORPHINE SULFATE (PF) 4 MG/ML IV SOLN
4.0000 mg | INTRAVENOUS | Status: DC | PRN
  Administered 2021-12-26: 4 mg via INTRAVENOUS
  Filled 2021-12-26: qty 1

## 2021-12-26 MED ORDER — SODIUM CHLORIDE 0.9 % IV SOLN
2.0000 g | INTRAVENOUS | Status: DC
  Administered 2021-12-27 – 2021-12-28 (×2): 2 g via INTRAVENOUS
  Filled 2021-12-26: qty 2
  Filled 2021-12-26: qty 20
  Filled 2021-12-26: qty 2

## 2021-12-26 MED ORDER — VITAMIN B-12 1000 MCG PO TABS
500.0000 ug | ORAL_TABLET | Freq: Every day | ORAL | Status: DC
  Administered 2021-12-27 – 2021-12-30 (×4): 500 ug via ORAL
  Filled 2021-12-26 (×5): qty 1

## 2021-12-26 MED ORDER — LACTATED RINGERS IV BOLUS (SEPSIS)
1000.0000 mL | Freq: Once | INTRAVENOUS | Status: AC
  Administered 2021-12-26: 1000 mL via INTRAVENOUS

## 2021-12-26 MED ORDER — ROPINIROLE HCL 1 MG PO TABS
0.5000 mg | ORAL_TABLET | Freq: Three times a day (TID) | ORAL | Status: AC | PRN
  Filled 2021-12-26: qty 2

## 2021-12-26 NOTE — Assessment & Plan Note (Signed)
-   Patient follows with GI specialist Dr. Tawni Carnes - Continue follow-up outpatient with gastroenterologist

## 2021-12-26 NOTE — ED Provider Notes (Signed)
[facility] Provider Note    Event Date/Time   First MD Initiated Contact with Patient 12/26/21 1518     (approximate)   History   Chest Pain (Pt from home and had episode of central chest pain while ambulating with home health aide. Pain does not radiate and pt reports it subsided prior to arrival. Pt endorses SOB but states it is no worse than normal for her. Pt denies cardiac history. Denies numbness/tingling. Pt had 324 ASA en route. )   HPI  Whitney [name] is a 81 y.o. female with diabetes presents to the ER for evaluation of this chest pain states that she feels pressure tightness in her chest that started.  His symptoms started while she was walking at home.  Feels like it has eased off but still having discomfort.  No numbness or tingling.  Denies any previous cardiac history.  She does not currently smoke.  Review of note 12/27 was reporting some trouble with hip pain for which she is scheduled outpatient MRI.      Physical Exam   Triage Vital Signs: ED Triage Vitals [12/26/21 1521]  Enc Vitals Group     BP (!) 143/74     Pulse Rate (!) 119     Resp (!) 21     Temp 98.2 F (36.8 C)     Temp Source Oral     SpO2 99 %     Weight 160 lb 0.9 oz (72.6 kg)     Height 5\' 2"  (1.575 m)     Head Circumference      Peak Flow      Pain Score 0     Pain Loc      Pain Edu?      Excl. in [place]?     Most recent vital signs: Vitals:   12/26/21 1935 12/26/21 2000  BP:  115/77  Pulse: (!) 102 (!) 101  Resp: 15 17  Temp:    SpO2: 96% 96%     Constitutional: Alert  Eyes: Conjunctivae are normal.  Head: Atraumatic. Nose: No congestion/rhinnorhea. Mouth/Throat: Mucous membranes are moist.   Neck: Painless ROM.  Cardiovascular:   mildly tachycardic, regular rhythm, Good peripheral circulation. Respiratory: Normal respiratory effort.  No retractions.  Gastrointestinal: Soft and nontender.  Musculoskeletal:  no deformity Neurologic:  MAE  spontaneously. No gross focal neurologic deficits are appreciated.  Skin:  Skin is warm, dry and intact. No rash noted. Psychiatric: Mood and affect are normal. Speech and behavior are normal.    ED Results / Procedures / Treatments   Labs (all labs ordered are listed, but only abnormal results are displayed) Labs Reviewed  CBC - Abnormal; Notable for the following components:      Result Value   Hemoglobin 10.8 (*)    HCT 34.0 (*)    RDW 16.4 (*)    All other components within normal limits  COMPREHENSIVE METABOLIC PANEL - Abnormal; Notable for the following components:   CO2 19 (*)    Glucose, Bld 103 (*)    BUN 34 (*)    Albumin 3.2 (*)    AST 45 (*)    Total Bilirubin 1.3 (*)    All other components within normal limits  LACTIC ACID, PLASMA - Abnormal; Notable for the following components:   Lactic Acid, Venous 5.6 (*)    All other components within normal limits  SALICYLATE LEVEL - Abnormal; Notable for the following components:   Salicylate Lvl [id number] (*)  All other components within normal limits  URINALYSIS, COMPLETE (UACMP) WITH MICROSCOPIC - Abnormal; Notable for the following components:   APPearance HAZY (*)    Hgb urine dipstick MODERATE (*)    Ketones, ur 40 (*)    Nitrite POSITIVE (*)    Leukocytes,Ua LARGE (*)    Bacteria, UA FEW (*)    All other components within normal limits  RESP PANEL BY RT-PCR (FLU A&B, COVID) ARPGX2  CULTURE, BLOOD (ROUTINE X 2)  CULTURE, BLOOD (ROUTINE X 2)  RESPIRATORY PANEL BY PCR  BRAIN NATRIURETIC PEPTIDE  TSH  PROCALCITONIN  BASIC METABOLIC PANEL  CBC WITH DIFFERENTIAL/PLATELET  HEMOGLOBIN A1C  LEGIONELLA PNEUMOPHILA SEROGP 1 UR AG  PROTIME-INR  TROPONIN I (HIGH SENSITIVITY)  TROPONIN I (HIGH SENSITIVITY)     EKG  ED ECG REPORT I, Merlyn Lot, the attending physician, personally viewed and interpreted this ECG.   Date: 12/26/2021  EKG Time: 15:19  Rate: 120  Rhythm: sinus  Axis: normal  Intervals:   normal qt  ST&T Change: no stemi, no depressions    RADIOLOGY Please see ED Course for my review and interpretation.  I personally reviewed all radiographic images ordered to evaluate for the above acute complaints and reviewed radiology reports and findings.  These findings were personally discussed with the patient.  Please see medical record for radiology report.    PROCEDURES:  Critical Care performed: Yes, see critical care procedure note(s)  .1-3 Lead EKG Interpretation Performed by: Merlyn Lot, MD Authorized by: Merlyn Lot, MD     Interpretation: abnormal     ECG rate:  120   ECG rate assessment: tachycardic     Rhythm: sinus rhythm   .Critical Care Performed by: Merlyn Lot, MD Authorized by: Merlyn Lot, MD   Critical care provider statement:    Critical care time (minutes):  35   Critical care was necessary to treat or prevent imminent or life-threatening deterioration of the following conditions:  Sepsis   Critical care was time spent personally by me on the following activities:  Ordering and performing treatments and interventions, ordering and review of laboratory studies, ordering and review of radiographic studies, pulse oximetry, re-evaluation of patient's condition, review of old charts, obtaining history from patient or surrogate, examination of patient, evaluation of patient's response to treatment, discussions with primary provider, discussions with consultants and development of treatment plan with patient or surrogate   MEDICATIONS ORDERED IN ED: Medications  lactated ringers bolus 1,000 mL (1,000 mLs Intravenous New Bag/Given 12/26/21 2016)  lactated ringers infusion (0 mLs Intravenous Hold 12/26/21 2035)  buPROPion (WELLBUTRIN XL) 24 hr tablet 150 mg (has no administration in time range)  mirabegron ER (MYRBETRIQ) tablet 50 mg (has no administration in time range)  ferrous sulfate tablet 325 mg (has no administration in time  range)  vitamin B-12 (CYANOCOBALAMIN) tablet 500 mcg (has no administration in time range)  amantadine (SYMMETREL) capsule 100 mg (has no administration in time range)  rOPINIRole (REQUIP) tablet 0.5 mg (has no administration in time range)  acetaminophen (TYLENOL) tablet 650 mg (has no administration in time range)    Or  acetaminophen (TYLENOL) suppository 650 mg (has no administration in time range)  ondansetron (ZOFRAN) tablet 4 mg (has no administration in time range)    Or  ondansetron (ZOFRAN) injection 4 mg (has no administration in time range)  enoxaparin (LOVENOX) injection 40 mg (has no administration in time range)  cefTRIAXone (ROCEPHIN) 2 g in sodium chloride 0.9 % 100 mL  IVPB (has no administration in time range)  azithromycin (ZITHROMAX) 500 mg in sodium chloride 0.9 % 250 mL IVPB (has no administration in time range)  insulin aspart (novoLOG) injection 0-15 Units (has no administration in time range)  insulin aspart (novoLOG) injection 0-5 Units (has no administration in time range)  hydrALAZINE (APRESOLINE) tablet 10 mg (has no administration in time range)  morphine 2 MG/ML injection 1 mg (has no administration in time range)  ondansetron (ZOFRAN) injection 2 mg (2 mg Intravenous Given 12/26/21 1530)  iohexol (OMNIPAQUE) 350 MG/ML injection 75 mL (75 mLs Intravenous Contrast Given 12/26/21 1655)  sodium chloride 0.9 % bolus 500 mL (0 mLs Intravenous Stopped 12/26/21 1831)  sodium chloride 0.9 % bolus 500 mL (0 mLs Intravenous Stopped 12/26/21 2004)  metoprolol tartrate (LOPRESSOR) injection 5 mg (5 mg Intravenous Given 12/26/21 1902)     IMPRESSION / MDM / ASSESSMENT AND PLAN / ED COURSE  I reviewed the triage vital signs and the nursing notes.                              Differential diagnosis includes, but is not limited to, ACS, pericarditis, esophagitis, boerhaaves, pe, dissection, pna, bronchitis, costochondritis  Presenting with chest discomfort tachycardia  shortness of breath as described above.  She is afebrile mildly tachycardic normotensive nonhypoxic.  EKG is nonischemic.  Initial troponin negative.  Abdominal exam is soft and benign.  Chest x-ray ordered.   Clinical Course as of 12/26/21 2114  Tue Dec 26, 2021  1607 Chest x-ray on my review does not show evidence of pneumothorax.  Will await radiology report.  Tachycardia chest pain pleuritic discomfort will order CTA to further evaluate. [PR]  1758 Patient assessed.  She is pain-free.  She is still a bit tachycardic on cardiac monitor we will give IV fluids and reassess.  CTA by my read no show any evidence of saddle PE.  Per radiology report no PE. [PR]  1949 Patient's lactate being 5.6.  Cultures ordered IV antibiotics.  Repeat abdominal exam soft benign.  Given her lactate vague presenting symptoms will order CT abdomen.  I have consulted with hospitalist for admission.  She does appear stable appropriate for admission to hospital [PR]  2031 Ctabd/pel imaging by my review does not show acute void radiology report. [PR]    Clinical Course User Index [PR] Merlyn Lot, MD     FINAL CLINICAL IMPRESSION(S) / ED DIAGNOSES   Final diagnoses:  Sepsis, due to unspecified organism, unspecified whether acute organ dysfunction present [facility])     Rx / DC Orders   ED Discharge Orders     None        Note:  This document was prepared using Dragon voice recognition software and may include unintentional dictation errors.    Merlyn Lot, MD 12/26/21 2114

## 2021-12-26 NOTE — Assessment & Plan Note (Signed)
Fall precautions.  

## 2021-12-26 NOTE — Assessment & Plan Note (Signed)
-   With markedly elevated lactic acid of 5.6 - Respiratory 20 pathogen PCR panel ordered along with Legionella antigen via urine ordered - Continue ceftriaxone 2 g daily and azithromycin 500 mg IV daily to complete 5-day course ordered - Telemetry cardiac, observation

## 2021-12-26 NOTE — Assessment & Plan Note (Signed)
-   Resumed amantadine capsule 100 mg daily

## 2021-12-26 NOTE — Assessment & Plan Note (Signed)
-   Holding home metformin at this time due to elevated lactic acid - Insulin SSI with at bedtime coverage ordered - CBG monitoring-3 times daily and AC - Goal inpatient blood glucose is 140 - 180

## 2021-12-26 NOTE — ED Notes (Signed)
Patient transported to MRI at this time. 

## 2021-12-26 NOTE — Assessment & Plan Note (Addendum)
-   On admission patient's hemoglobin is 10.8, patient's hemoglobin range from 20 21-20 22 has been 9.5-12.3 - Resumed home ferrous sulfate tablet 325 mg p.o. daily - Continue follow-up with hematologist outpatient for management

## 2021-12-26 NOTE — Assessment & Plan Note (Addendum)
-  Patient met sepsis criteria by the very definition with increase respiration rate, heart rate, elevated lactic acid of 5.6, source is pneumonia - Status post azithromycin 500 mg IV, ceftriaxone 2 g IV per EDP - We will continue these antibiotics to complete a 5-day course - Patient is status post lactated ringer 1 L bolus and sodium chloride 500 mL bolus x2 by EDP - Patient is maintaining appropriate MAP, no further clinical indications for fluid bolus at this time

## 2021-12-26 NOTE — ED Notes (Signed)
Lab called at this time. Confirmed they would add on procalcitonin, respiratory panel, and legionella to existing specimens.

## 2021-12-26 NOTE — Assessment & Plan Note (Signed)
-   Resumed home bupropion 150 mg tablet 24-hour, daily

## 2021-12-26 NOTE — Assessment & Plan Note (Signed)
-   MRI of the brain without contrast has been ordered

## 2021-12-26 NOTE — H&P (Signed)
History and Physical   Whitney [name] RFX:[id number] DOB: June 15, 1941 DOA: 12/26/2021  PCP: Idelle Crouch, MD  Outpatient Specialists: Dr. Manuella Ghazi, [facility] neurology Patient coming from: Home  I have personally briefly reviewed patient's old medical records in [place].  Chief Concern: Chest pain  HPI: Whitney [name] is a 81 year old female with history of Parkinson's disease, non-insulin-dependent diabetes mellitus, depression, hepatic cirrhosis, pulmonary hypertension, who presents emergency department for chief concerns of vague chest pain.  Vitals in the emergency department showed temperature of 98.2, respiration rate of 21, initial heart rate 120 and improved to 102, SPO2 of 98% on room air, blood pressure 135/77.  Labs in the emergency department showed serum sodium 138, potassium 4.5, chloride 105, bicarb 19, BUN 34, serum creatinine of 0.62, GFR greater than 60, nonfasting glucose 103, WBC 6.9, hemoglobin 10.8, platelets of 157.  High sensitive troponin was 7.  BNP was 28.6.  TSH was 1.910.  Lactic acid was markedly elevated at 5.6.  COVID/influenza A/influenza B PCR were negative.  Blood cultures x2 have been collected in the emergency department.  At bedside, she is able to tell me her name, age, current location, and daughter [name]) at bedside.   She talks with her eyes closed and has a flat affect. Daughter states that patient has started closing her eyes when talking for the last 2 weeks and daughter does not know why.  Daughter further endorses that patient has been slurring her speech more over the last 2 weeks.  She states that patient's primary care provider has ordered an MRI of the brain.  She reports chest pain that started when she got out of the bathroom. She endorses shortness of breath.  She reports that at its peak, the chest pain was an 8 out of 10 and lasted several hours.  She reports the chest pain is sharp.  She states that at present the pain is  about 2 out of 10.  She denies trauma.  She states that she felt this way about 2 weeks ago.  She denies cough. She endorses nausea and denies vomiting. She denies dysuria, hematuria, diarrhea, syncope. She endorses baseline swelling of her legs the last 2 weeks.  Social history: She lives by herselt and has caregiver in the AM and PM. She is a tobacco user, quitting 5 years ago. She denies etoh use.   Vaccination history: She is vaccinated for covid 19, 3 doses   ROS: Constitutional: no fever ENT/Mouth: no sore throat, no rhinorrhea Eyes: no eye pain, no vision changes Cardiovascular: + chest pain, + dyspnea,  no edema, no palpitations Respiratory: no cough, no sputum, no wheezing Gastrointestinal: no nausea, no vomiting, no diarrhea, no constipation Genitourinary: no urinary incontinence, no dysuria, no hematuria Musculoskeletal: no arthralgias, no myalgias Skin: no skin lesions, no pruritus, Neuro: + weakness, no loss of consciousness, no syncope Psych: no anxiety, no depression, + decrease appetite Heme/Lymph: no bruising, no bleeding  ED Course: Discussed with emergency medicine provider, patient requiring hospitalization for chief concerns of meeting sepsis criteria.  Assessment/Plan  Principal Problem:   Atypical pneumonia Active Problems:   Diabetes mellitus type 2 in obese (HCC)   Overactive bladder   Parkinson's disease ([place])   Vitamin D deficiency   At high risk for falls   Impaired mobility and ADLs   Depression   Sepsis (HCC)   Iron deficiency anemia   Hepatic cirrhosis (HCC)   Slurred speech   Pyuria    Respiratory *  Atypical pneumonia Assessment & Plan - With markedly elevated lactic acid of 5.6 - Respiratory 20 pathogen PCR panel ordered along with Legionella antigen via urine ordered - Continue ceftriaxone 2 g daily and azithromycin 500 mg IV daily to complete 5-day course ordered - Telemetry cardiac, observation  Digestive Hepatic cirrhosis  ([place]) Assessment & Plan - Patient follows with GI specialist Dr. Enriqueta Shutter - Continue follow-up outpatient with gastroenterologist  Endocrine Diabetes mellitus type 2 in obese [facility]) Assessment & Plan - Holding home metformin at this time due to elevated lactic acid - Insulin SSI with at bedtime coverage ordered - CBG monitoring-3 times daily and AC - Goal inpatient blood glucose is 140 - 180  Nervous and Auditory Parkinson's disease (HCC) Assessment & Plan - Resumed amantadine capsule 100 mg daily  Other Pyuria Assessment & Plan - Ceftriaxone 2 g IV daily - Query UTI - Urine culture ordered  Slurred speech Assessment & Plan - MRI of the brain without contrast has been ordered  Iron deficiency anemia Assessment & Plan - On admission patient's hemoglobin is 10.8, patient's hemoglobin range from 20 21-20 22 has been 9.5-12.3 - Resumed home ferrous sulfate tablet 325 mg p.o. daily - Continue follow-up with hematologist outpatient for management  Sepsis [facility]) Assessment & Plan - Patient met sepsis criteria by the very definition with increase respiration rate, heart rate, elevated lactic acid of 5.6, source is pneumonia - Status post azithromycin 500 mg IV, ceftriaxone 2 g IV per EDP - We will continue these antibiotics to complete a 5-day course - Patient is status post lactated ringer 1 L bolus and sodium chloride 500 mL bolus x2 by EDP - Patient is maintaining appropriate MAP, no further clinical indications for fluid bolus at this time  Depression Assessment & Plan - Resumed home bupropion 150 mg tablet 24-hour, daily  At high risk for falls Assessment & Plan - Fall precautions  Chart reviewed.   DVT prophylaxis: Enoxaparin 40 mg subcutaneous every 24 hours Code Status: Full code Diet: Heart healthy/carb modified Family Communication: Updated daughter, [name] at bedside Disposition Plan: Pending clinical course Consults called: None at this time Admission  status: Telemetry cardiac, observation  Past Medical History:  Diagnosis Date   Anemia    Arthritis    Broken shoulder    left humerus    COVID-19    Depression    Diabetes mellitus without complication ([place])    Goiter    Hyperlipidemia    Overactive bladder    Parkinson's disease ([place])    Pneumonia    Right arm weakness    limited movement   Unsteady gait    Past Surgical History:  Procedure Laterality Date   [place]   post hysterectomy   NECK SURGERY     anterior cervical disc fusion   THYROIDECTOMY Left 08/03/2020   Procedure: THYROIDECTOMY-HEMI;  Surgeon: Clyde Canterbury, MD;  Location: [facility] ORS;  Service: ENT;  Laterality: Left;   Social History:  reports that she quit smoking about 3 years ago. Her smoking use included cigarettes. She has a 80.00 pack-year smoking history. She has never used smokeless tobacco. She reports that she does not currently use alcohol. She reports that she does not use drugs.  No Known Allergies Family History  Problem Relation Age of Onset   Stroke Mother    Hypertension Mother    Diabetes Father    Heart disease Father    Lung cancer Father  Colon cancer Sister    Diabetes Sister    Kidney disease Daughter    Kidney cancer Daughter    Pulmonary embolism Sister    Diabetes Sister    Kidney disease Sister    Heart disease Sister    Atrial fibrillation Sister    Diabetes Brother    Heart disease Brother    AAA (abdominal aortic aneurysm) Brother    Cancer Brother        skin   Family history: Family history reviewed and not pertinent\.  Prior to Admission medications   Medication Sig Start Date End Date Taking? Authorizing Provider  amantadine (SYMMETREL) 100 MG capsule Take 100 mg by mouth 2 (two) times daily. 12/05/21  Yes [provider]  buPROPion (WELLBUTRIN XL) 150 MG 24 hr tablet TAKE 1 TABLET(150 MG) BY MOUTH DAILY 04/01/20  Yes Delsa Grana, PA-C  cholecalciferol  (VITAMIN D3) 25 MCG (1000 UNIT) tablet Take 1,000 Units by mouth daily.    Yes [provider]  ferrous sulfate 325 (65 FE) MG tablet Take 325 mg by mouth daily with breakfast.    Yes [provider]  metFORMIN (GLUCOPHAGE-XR) 500 MG 24 hr tablet Take 1 tablet (500 mg total) by mouth 2 (two) times daily with a meal. TAKE 1 TABLET(500 MG) BY MOUTH BID Patient taking differently: Take 500 mg by mouth 2 (two) times daily. TAKE 1 TABLET(500 MG) BY MOUTH BID 09/01/20  Yes Delsa Grana, PA-C  MYRBETRIQ 50 MG TB24 tablet TAKE 1 TABLET(50 MG) BY MOUTH DAILY 07/20/21  Yes Vaillancourt, Samantha, PA-C  Trospium Chloride 60 MG CP24 TAKE 1 CAPSULE(60 MG) BY MOUTH DAILY 09/19/21  Yes Vaillancourt, Samantha, PA-C  vitamin B-12 (CYANOCOBALAMIN) 500 MCG tablet Take 500 mcg by mouth daily.   Yes [provider]  ACCU-CHEK GUIDE test strip  04/27/20   [provider]  Accu-Chek Softclix Lancets lancets  04/27/20   [provider]  acetaminophen (TYLENOL) 500 MG tablet Take 500-1,000 mg by mouth every 6 (six) hours as needed (for pain.).    [provider]  Alcohol Swabs (B-D SINGLE USE SWABS REGULAR) PADS  04/27/20   [provider]  Blood Glucose Monitoring Suppl (ACCU-CHEK GUIDE ME) w/Device KIT  04/27/20   [provider]  meloxicam (MOBIC) 7.5 MG tablet Take by mouth. Patient not taking: Reported on 12/26/2021 03/16/21 03/16/22  [provider]  rOPINIRole (REQUIP) 0.25 MG tablet Take 0.5 mg by mouth 3 (three) times daily. Patient not taking: Reported on 12/26/2021 10/21/21   [provider]   Physical Exam: Vitals:   12/26/21 1930 12/26/21 1935 12/26/21 2000 12/26/21 2140  BP: 127/78  115/77 127/69  Pulse: (!) 101 (!) 102 (!) 101 (!) 108  Resp: (!) '24 15 17 11  ' Temp:      TempSrc:      SpO2: 97% 96% 96% 97%  Weight:      Height:       Constitutional: appears age-appropriate, frail, NAD, calm, comfortable Eyes: PERRL, lids  and conjunctivae normal ENMT: Mucous membranes are moist. Posterior pharynx clear of any exudate or lesions. Age-appropriate dentition. Hearing appropriate Neck: normal, supple, no masses, no thyromegaly Respiratory: clear to auscultation bilaterally, no wheezing, no crackles. Normal respiratory effort. No accessory muscle use.  Cardiovascular: Regular rate and rhythm, no murmurs / rubs / gallops. No extremity edema. 2+ pedal pulses. No carotid bruits.  Abdomen: Obese abdomen, no tenderness, no masses palpated, no hepatosplenomegaly. Bowel sounds positive.  Musculoskeletal: no  clubbing / cyanosis. No joint deformity upper and lower extremities. Good ROM, no contractures, no atrophy. Normal muscle tone.  Skin: no rashes, lesions, ulcers. No induration Neurologic: Sensation intact. Strength 5/5 in all 4.  Psychiatric: Normal judgment and insight. Alert and oriented x 3. Normal mood.   EKG: independently reviewed, showing sinus tachycardia with rate of 118, QTc 432  Chest x-ray on Admission: I personally reviewed and I agree with radiologist reading as below.  CT ABDOMEN PELVIS WO CONTRAST  Result Date: 12/26/2021 CLINICAL DATA:  Un witnessed fall, sepsis EXAM: CT ABDOMEN AND PELVIS WITHOUT CONTRAST TECHNIQUE: Multidetector CT imaging of the abdomen and pelvis was performed following the standard protocol without IV contrast. RADIATION DOSE REDUCTION: This exam was performed according to the departmental dose-optimization program which includes automated exposure control, adjustment of the mA and/or kV according to patient size and/or use of iterative reconstruction technique. COMPARISON:  12/26/2021, 10/18/2020, 06/29/2020 FINDINGS: Lower chest: Improved aeration at the lung bases since prior CT. There is an indeterminate 0.5 x 0.6 cm ground-glass nodule within the left lower lobe. Hepatobiliary: Diffuse nodularity of the liver capsule consistent with cirrhosis. No focal parenchymal abnormalities  identified on this limited unenhanced exam. Gallbladder is unremarkable. Pancreas: Unremarkable unenhanced appearance. Spleen: Mild splenomegaly, measuring 13.2 cm in craniocaudal length. No focal abnormalities on this unenhanced exam. Adrenals/Urinary Tract: Excreted contrast from previous CT angiography procedure. No evidence of urinary tract calculi or obstruction. Small peripelvic cysts are seen within the kidneys, with a 2.2 cm simple appearing right renal cyst also noted. The adrenals are unremarkable. No filling defects within the bladder. Stomach/Bowel: No bowel obstruction or ileus. Diverticulosis of the sigmoid colon without acute diverticulitis. Mild wall thickening and pericolonic fat stranding involving the ascending colon, which could reflect mild colitis. Remaining portions of the bowel are unremarkable. Vascular/Lymphatic: Mild atherosclerosis throughout the aorta and its branches. There is recanalization of the umbilical vein. Small varices are seen along the lesser curvature of the stomach. Numerous subcentimeter lymph nodes are seen within the mesentery, likely reactive. No pathologic adenopathy within the abdomen or pelvis. Reproductive: Status post hysterectomy. No adnexal masses. Other: No free fluid or free intraperitoneal gas. No abdominal wall hernia. Musculoskeletal: No acute or destructive bony lesions. Reconstructed images demonstrate no additional findings. IMPRESSION: 1. Mild wall thickening and pericolonic fat stranding involving the ascending colon, consistent with inflammatory or infectious colitis. 2. Cirrhosis, with portal venous hypertension manifested by splenomegaly, gastric varices, and recanalization of the umbilical vein. 3. 6 mm left ground-glass left lower lobe pulmonary nodule. Recommend a non-contrast Chest CT at 6-12 months to confirm persistence, then additional non-contrast Chest CTs every 2 years until 5 years. If nodule grows or develops solid component(s), consider  resection. These guidelines do not apply to immunocompromised patients and patients with cancer. Follow up in patients with significant comorbidities as clinically warranted. For lung cancer screening, adhere to Lung-RADS guidelines. Reference: Radiology. 2017; 284(1):228-43. 4. Sigmoid diverticulosis without diverticulitis. 5.  Aortic Atherosclerosis (ICD10-I70.0). Electronically Signed   By: Randa Ngo M.D.   On: 12/26/2021 20:44   CT Angio Chest PE W and/or Wo Contrast  Result Date: 12/26/2021 CLINICAL DATA:  High probability for pulmonary embolus. Chest pain. Shortness of breath. EXAM: CT ANGIOGRAPHY CHEST WITH CONTRAST TECHNIQUE: Multidetector CT imaging of the chest was performed using the standard protocol during bolus administration of intravenous contrast. Multiplanar CT image reconstructions and MIPs were obtained to evaluate the vascular anatomy. RADIATION DOSE REDUCTION: This exam was performed according  to the departmental dose-optimization program which includes automated exposure control, adjustment of the mA and/or kV according to patient size and/or use of iterative reconstruction technique. CONTRAST:  88m OMNIPAQUE IOHEXOL 350 MG/ML SOLN COMPARISON:  Chest x-ray on 12/26/2021 and CT chest on 02/15/2021 FINDINGS: Cardiovascular: Pulmonary arteries are well opacified by the contrast bolus in there is no acute embolus. Coronary artery calcifications are present. No pericardial effusion. Heart is mildly enlarged. Pulmonary arteries segments are dilated, consistent with pulmonary arterial hypertension. Thoracic aorta is minimally calcified but not aneurysmal. Mediastinum/Nodes: No enlarged mediastinal, hilar, or axillary lymph nodes. Thyroid gland, trachea, and esophagus demonstrate no significant findings. Lungs/Pleura: There are focal patchy areas of ground-glass opacity throughout the lungs bilaterally, consistent with infectious or inflammatory process. The no suspicious pulmonary nodules.  No pleural effusions. The airways are patent. Upper Abdomen: Nodular, rounded contour of the liver is consistent with cirrhosis. Gallbladder is present. Although only partially imaged, spleen appears enlarged, consistent with portal venous hypertension. Musculoskeletal: Degenerative changes in the midthoracic spine. Remote anterior cervical fusion. Review of the MIP images confirms the above findings. IMPRESSION: 1. Technically adequate exam showing no acute pulmonary embolus. 2. Mild cardiomegaly. 3. Pulmonary arterial enlargement consistent with pulmonary arterial hypertension. 4. Focal patchy ground-glass opacities throughout the lungs consistent with infectious or inflammatory process. 5. Hepatic cirrhosis. Suspect splenomegaly and portal venous hypertension. Electronically Signed   By: ENolon NationsM.D.   On: 12/26/2021 17:33   DG Chest Portable 1 View  Result Date: 12/26/2021 CLINICAL DATA:  Chest pain.  Evaluate for CHF or pulmonary edema. EXAM: PORTABLE CHEST 1 VIEW COMPARISON:  02/15/2021 CT.  Most recent plain film 11/18/2019 FINDINGS: Cervical spine fixation. Patient rotated right. Midline trachea. Cardiomegaly accentuated by AP portable technique. Tortuous thoracic aorta. No pleural effusion or pneumothorax. Low lung volumes. Peripheral and basilar predominant septal thickening is new since the prior plain film. No lobar consolidation. There may be mild left base subsegmental atelectasis or scarring. IMPRESSION: Cardiomegaly with new septal thickening, favoring pulmonary venous congestion or mild interstitial edema. Electronically Signed   By: KAbigail MiyamotoM.D.   On: 12/26/2021 16:03    Labs on Admission: I have personally reviewed following labs  CBC: Recent Labs  Lab 12/26/21 1525  WBC 6.9  HGB 10.8*  HCT 34.0*  MCV 87.6  PLT 1403  Basic Metabolic Panel: Recent Labs  Lab 12/26/21 1525  NA 138  K 4.5  CL 105  CO2 19*  GLUCOSE 103*  BUN 34*  CREATININE 0.62  CALCIUM 9.5    GFR: Estimated Creatinine Clearance: 52.3 mL/min (by C-G formula based on SCr of 0.62 mg/dL).  Liver Function Tests: Recent Labs  Lab 12/26/21 1525  AST 45*  ALT 29  ALKPHOS 91  BILITOT 1.3*  PROT 6.5  ALBUMIN 3.2*   Thyroid Function Tests: Recent Labs    12/26/21 1525  TSH 1.910   Urine analysis:    Component Value Date/Time   COLORURINE YELLOW 12/26/2021 1959   APPEARANCEUR HAZY (A) 12/26/2021 1959   LABSPEC 1.010 12/26/2021 1959   PHURINE 5.0 12/26/2021 1959   GLUCOSEU NEGATIVE 12/26/2021 1959   HGBUR MODERATE (A) 12/26/2021 [place] 12/26/2021 1959   KETONESUR 40 (A) 12/26/2021 1959   PROTEINUR NEGATIVE 12/26/2021 1959   NITRITE POSITIVE (A) 12/26/2021 1959   LEUKOCYTESUR LARGE (A) 12/26/2021 1959   CRITICAL CARE Performed by: ABriant CedarCox  Total critical care time: 35 minutes  Critical care time was exclusive  of separately billable procedures and treating other patients.  Critical care was necessary to treat or prevent imminent or life-threatening deterioration.  Critical care was time spent personally by me on the following activities: development of treatment plan with patient and/or surrogate as well as nursing, discussions with consultants, evaluation of patient's response to treatment, examination of patient, obtaining history from patient or surrogate, ordering and performing treatments and interventions, ordering and review of laboratory studies, ordering and review of radiographic studies, pulse oximetry and re-evaluation of patient's condition.  Dr. Tobie Poet [facility]  If 7PM-7AM, please contact overnight-coverage provider If 7AM-7PM, please contact day coverage provider [email or web address]  12/26/2021, 9:54 PM

## 2021-12-26 NOTE — Assessment & Plan Note (Addendum)
-   Ceftriaxone 2 g IV daily - Query UTI - Urine culture ordered

## 2021-12-26 NOTE — Hospital Course (Signed)
Whitney [name] is a 81 year old female with history of Parkinson's disease, non-insulin-dependent diabetes mellitus, depression, hepatic cirrhosis, pulmonary hypertension, who presents emergency department for chief concerns of vague chest pain.  Vitals in the emergency department showed temperature of 98.2, respiration rate of 21, initial heart rate 120 and improved to 102, SPO2 of 98% on room air, blood pressure 135/77.  Labs in the emergency department showed serum sodium 138, potassium 4.5, chloride 105, bicarb 19, BUN 34, serum creatinine of 0.62, GFR greater than 60, nonfasting glucose 103, WBC 6.9, hemoglobin 10.8, platelets of 157.  High sensitive troponin was 7.  BNP was 28.6.  TSH was 1.910.  Lactic acid was markedly elevated at 5.6.  COVID/influenza A/influenza B PCR were negative.  Blood cultures x2 have been collected in the emergency department.

## 2021-12-27 ENCOUNTER — Inpatient Hospital Stay: Payer: Medicare PPO | Admitting: Anesthesiology

## 2021-12-27 ENCOUNTER — Encounter: Payer: Self-pay | Admitting: Internal Medicine

## 2021-12-27 ENCOUNTER — Encounter
Admission: EM | Disposition: A | Discharge status: Home Health | Payer: Self-pay | Source: Home / Self Care | Attending: Internal Medicine

## 2021-12-27 DIAGNOSIS — A419 Sepsis, unspecified organism: Secondary | ICD-10-CM | POA: Diagnosis present

## 2021-12-27 DIAGNOSIS — D62 Acute posthemorrhagic anemia: Secondary | ICD-10-CM | POA: Diagnosis not present

## 2021-12-27 DIAGNOSIS — R710 Precipitous drop in hematocrit: Secondary | ICD-10-CM | POA: Diagnosis not present

## 2021-12-27 DIAGNOSIS — E119 Type 2 diabetes mellitus without complications: Secondary | ICD-10-CM

## 2021-12-27 DIAGNOSIS — R6520 Severe sepsis without septic shock: Secondary | ICD-10-CM

## 2021-12-27 DIAGNOSIS — K921 Melena: Secondary | ICD-10-CM

## 2021-12-27 DIAGNOSIS — J189 Pneumonia, unspecified organism: Secondary | ICD-10-CM | POA: Diagnosis present

## 2021-12-27 DIAGNOSIS — K449 Diaphragmatic hernia without obstruction or gangrene: Secondary | ICD-10-CM | POA: Diagnosis present

## 2021-12-27 DIAGNOSIS — K3189 Other diseases of stomach and duodenum: Secondary | ICD-10-CM | POA: Diagnosis present

## 2021-12-27 DIAGNOSIS — D509 Iron deficiency anemia, unspecified: Secondary | ICD-10-CM | POA: Diagnosis present

## 2021-12-27 DIAGNOSIS — K2971 Gastritis, unspecified, with bleeding: Secondary | ICD-10-CM | POA: Diagnosis present

## 2021-12-27 DIAGNOSIS — Z1619 Resistance to other specified beta lactam antibiotics: Secondary | ICD-10-CM | POA: Diagnosis present

## 2021-12-27 DIAGNOSIS — E1165 Type 2 diabetes mellitus with hyperglycemia: Secondary | ICD-10-CM

## 2021-12-27 DIAGNOSIS — E669 Obesity, unspecified: Secondary | ICD-10-CM | POA: Diagnosis present

## 2021-12-27 DIAGNOSIS — D61818 Other pancytopenia: Secondary | ICD-10-CM | POA: Diagnosis present

## 2021-12-27 DIAGNOSIS — F32A Depression, unspecified: Secondary | ICD-10-CM

## 2021-12-27 DIAGNOSIS — G20 Parkinson's disease: Secondary | ICD-10-CM | POA: Diagnosis present

## 2021-12-27 DIAGNOSIS — E872 Acidosis, unspecified: Secondary | ICD-10-CM

## 2021-12-27 DIAGNOSIS — I8510 Secondary esophageal varices without bleeding: Secondary | ICD-10-CM | POA: Diagnosis present

## 2021-12-27 DIAGNOSIS — E559 Vitamin D deficiency, unspecified: Secondary | ICD-10-CM | POA: Diagnosis present

## 2021-12-27 DIAGNOSIS — Z6832 Body mass index (BMI) 32.0-32.9, adult: Secondary | ICD-10-CM | POA: Diagnosis not present

## 2021-12-27 DIAGNOSIS — N3281 Overactive bladder: Secondary | ICD-10-CM | POA: Diagnosis present

## 2021-12-27 DIAGNOSIS — Z87891 Personal history of nicotine dependence: Secondary | ICD-10-CM | POA: Diagnosis not present

## 2021-12-27 DIAGNOSIS — K766 Portal hypertension: Secondary | ICD-10-CM | POA: Diagnosis present

## 2021-12-27 DIAGNOSIS — Z8616 Personal history of COVID-19: Secondary | ICD-10-CM | POA: Diagnosis not present

## 2021-12-27 DIAGNOSIS — N3001 Acute cystitis with hematuria: Secondary | ICD-10-CM | POA: Diagnosis present

## 2021-12-27 DIAGNOSIS — K7460 Unspecified cirrhosis of liver: Secondary | ICD-10-CM | POA: Diagnosis present

## 2021-12-27 HISTORY — PX: ESOPHAGOGASTRODUODENOSCOPY: SHX5428

## 2021-12-27 LAB — BASIC METABOLIC PANEL
Anion gap: 8 (ref 5–15)
BUN: 35 mg/dL — ABNORMAL HIGH (ref 8–23)
CO2: 21 mmol/L — ABNORMAL LOW (ref 22–32)
Calcium: 8.1 mg/dL — ABNORMAL LOW (ref 8.9–10.3)
Chloride: 109 mmol/L (ref 98–111)
Creatinine, Ser: 0.5 mg/dL (ref 0.44–1.00)
GFR, Estimated: >60 mL/min (ref >60)
Glucose, Bld: 154 mg/dL — ABNORMAL HIGH (ref 70–99)
Potassium: 4.5 mmol/L (ref 3.5–5.1)
Sodium: 138 mmol/L (ref 135–145)

## 2021-12-27 LAB — RESPIRATORY PANEL BY PCR

## 2021-12-27 LAB — CBC WITH DIFFERENTIAL/PLATELET
Abs Immature Granulocytes: 0.04 10*3/uL (ref 0.00–0.07)
Basophils Absolute: 0 10*3/uL (ref 0.0–0.1)
Basophils Relative: 1 %
Eosinophils Absolute: 0.2 10*3/uL (ref 0.0–0.5)
Eosinophils Relative: 2 %
HCT: 24.5 % — ABNORMAL LOW (ref 36.0–46.0)
Hemoglobin: 8.1 g/dL — ABNORMAL LOW (ref 12.0–15.0)
Immature Granulocytes: 1 %
Lymphocytes Relative: 21 %
Lymphs Abs: 1.4 10*3/uL (ref 0.7–4.0)
MCH: 29.2 pg (ref 26.0–34.0)
MCHC: 33.1 g/dL (ref 30.0–36.0)
MCV: 88.4 fL (ref 80.0–100.0)
Monocytes Absolute: 0.6 10*3/uL (ref 0.1–1.0)
Monocytes Relative: 9 %
Neutro Abs: 4.4 10*3/uL (ref 1.7–7.7)
Neutrophils Relative %: 66 %
Platelets: 131 10*3/uL — ABNORMAL LOW (ref 150–400)
RBC: 2.77 MIL/uL — ABNORMAL LOW (ref 3.87–5.11)
RDW: 16.6 % — ABNORMAL HIGH (ref 11.5–15.5)
WBC: 6.6 10*3/uL (ref 4.0–10.5)
nRBC: 0 % (ref 0.0–0.2)

## 2021-12-27 LAB — GLUCOSE, CAPILLARY
Glucose-Capillary: 151 mg/dL — ABNORMAL HIGH (ref 70–99)
Glucose-Capillary: 125 mg/dL — ABNORMAL HIGH (ref 70–99)
Glucose-Capillary: 155 mg/dL — ABNORMAL HIGH (ref 70–99)

## 2021-12-27 LAB — HEMOGLOBIN
Hemoglobin: 8.1 g/dL — ABNORMAL LOW (ref 12.0–15.0)
Hemoglobin: 7.8 g/dL — ABNORMAL LOW (ref 12.0–15.0)

## 2021-12-27 LAB — LACTIC ACID, PLASMA: Lactic Acid, Venous: 3.8 mmol/L (ref 0.5–1.9)

## 2021-12-27 LAB — HEMOGLOBIN A1C
Hgb A1c MFr Bld: 6.8 % — ABNORMAL HIGH (ref 4.8–5.6)
Mean Plasma Glucose: 148.46 mg/dL

## 2021-12-27 LAB — CBG MONITORING, ED: Glucose-Capillary: 147 mg/dL — ABNORMAL HIGH (ref 70–99)

## 2021-12-27 SURGERY — EGD (ESOPHAGOGASTRODUODENOSCOPY)
Anesthesia: General

## 2021-12-27 MED ORDER — OCTREOTIDE LOAD VIA INFUSION
100.0000 ug | Freq: Once | INTRAVENOUS | Status: AC
Start: 2021-12-27 — End: 2021-12-27
  Administered 2021-12-27: 100 ug via INTRAVENOUS
  Filled 2021-12-27: qty 50

## 2021-12-27 MED ORDER — PROCHLORPERAZINE EDISYLATE 10 MG/2ML IJ SOLN
10.0000 mg | Freq: Once | INTRAMUSCULAR | Status: DC
  Filled 2021-12-27: qty 2

## 2021-12-27 MED ORDER — SODIUM CHLORIDE 0.9 % IV SOLN: INTRAVENOUS | Status: DC | PRN

## 2021-12-27 MED ORDER — PROPOFOL 500 MG/50ML IV EMUL
INTRAVENOUS | Status: DC | PRN
Start: 2021-12-27 — End: 2021-12-27
  Administered 2021-12-27: 100 ug/kg/min via INTRAVENOUS

## 2021-12-27 MED ORDER — LACTATED RINGERS IV SOLN: INTRAVENOUS | Status: DC

## 2021-12-27 MED ORDER — PANTOPRAZOLE SODIUM 40 MG IV SOLR
40.0000 mg | Freq: Two times a day (BID) | INTRAVENOUS | Status: DC
  Administered 2021-12-27 – 2021-12-29 (×5): 40 mg via INTRAVENOUS
  Filled 2021-12-27 (×5): qty 40

## 2021-12-27 MED ORDER — LIDOCAINE HCL (CARDIAC) PF 100 MG/5ML IV SOSY
PREFILLED_SYRINGE | INTRAVENOUS | Status: DC | PRN
  Administered 2021-12-27: 50 mg via INTRAVENOUS

## 2021-12-27 MED ORDER — SODIUM CHLORIDE 0.9 % IV SOLN: INTRAVENOUS | Status: DC

## 2021-12-27 MED ORDER — SODIUM CHLORIDE 0.9 % IV SOLN
50.0000 ug/h | INTRAVENOUS | Status: DC
  Administered 2021-12-27 – 2021-12-29 (×4): 50 ug/h via INTRAVENOUS
  Filled 2021-12-27 (×8): qty 1

## 2021-12-27 MED ORDER — PROPOFOL 10 MG/ML IV BOLUS
INTRAVENOUS | Status: DC | PRN
  Administered 2021-12-27: 40 mg via INTRAVENOUS

## 2021-12-27 NOTE — Anesthesia Preprocedure Evaluation (Addendum)
Anesthesia Evaluation  Patient identified by MRN, date of birth, ID band Patient awake    Reviewed: Allergy & Precautions, NPO status , Patient's Chart, lab work & pertinent test results  History of Anesthesia Complications Negative for: history of anesthetic complications  Airway Mallampati: II   Neck ROM: Full    Dental   Missing several teeth:   Pulmonary pneumonia (atypical, found this admission), former smoker (quit 2020),     + decreased breath sounds      Cardiovascular pulmonary hypertensionNormal cardiovascular exam Rhythm:Regular Rate:Normal  ECG 12/26/21:  Sinus tachycardia Low voltage, precordial leads Borderline T abnormalities, anterior leads   Neuro/Psych PSYCHIATRIC DISORDERS Depression  Neuromuscular disease (Parkinson disease)    GI/Hepatic (+) Cirrhosis       , GI bleed   Endo/Other  diabetes, Type 2Obesity   Renal/GU      Musculoskeletal   Abdominal   Peds  Hematology  (+) Blood dyscrasia, anemia ,   Anesthesia Other Findings   Reproductive/Obstetrics                            Anesthesia Physical Anesthesia Plan  ASA: 4 and emergent  Anesthesia Plan: General   Post-op Pain Management:    Induction: Intravenous  PONV Risk Score and Plan: 3 and Propofol infusion, TIVA and Treatment may vary due to age or medical condition  Airway Management Planned: Natural Airway  Additional Equipment:   Intra-op Plan:   Post-operative Plan:   Informed Consent: I have reviewed the patients History and Physical, chart, labs and discussed the procedure including the risks, benefits and alternatives for the proposed anesthesia with the patient or authorized representative who has indicated his/her understanding and acceptance.     Consent reviewed with POA  Plan Discussed with: CRNA  Anesthesia Plan Comments: (Discussed anesthesia plan with patient and daughter,  Charlsie Merles, over the phone.  LMA/GETA backup discussed.  Patient consented for risks of anesthesia including but not limited to:  - adverse reactions to medications - damage to eyes, teeth, lips or other oral mucosa - nerve damage due to positioning  - sore throat or hoarseness - damage to heart, brain, nerves, lungs, other parts of body or loss of life  Informed patient about role of CRNA in peri- and intra-operative care.  Patient voiced understanding.)       Anesthesia Quick Evaluation

## 2021-12-27 NOTE — Anesthesia Postprocedure Evaluation (Signed)
Anesthesia Post Note  Patient: Whitney [name]  Procedure(s) Performed: ESOPHAGOGASTRODUODENOSCOPY (EGD)  Patient location during evaluation: PACU Anesthesia Type: General Level of consciousness: awake and alert, oriented and patient cooperative Pain management: pain level controlled Vital Signs Assessment: post-procedure vital signs reviewed and stable Respiratory status: spontaneous breathing, nonlabored ventilation and respiratory function stable Cardiovascular status: blood pressure returned to baseline and stable Postop Assessment: adequate PO intake Anesthetic complications: no   No notable events documented.   Last Vitals:  Vitals:   12/27/21 1434 12/27/21 1454  BP: (!) 86/49 (!) 97/47  Pulse:    Resp:    Temp:    SpO2:      Last Pain:  Vitals:   12/27/21 1454  TempSrc:   PainSc: Asleep                 Darrin Nipper

## 2021-12-27 NOTE — Transfer of Care (Signed)
Immediate Anesthesia Transfer of Care Note  Patient: Whitney [name]  Procedure(s) Performed: ESOPHAGOGASTRODUODENOSCOPY (EGD)  Patient Location: PACU  Anesthesia Type:General  Level of Consciousness: awake and drowsy  Airway & Oxygen Therapy: Patient Spontanous Breathing  Post-op Assessment: Report given to RN and Post -op Vital signs reviewed and stable  Post vital signs: Reviewed and stable  Last Vitals:  Vitals Value Taken Time  BP    Temp    Pulse 98 12/27/21 1425  Resp 22 12/27/21 1425  SpO2 93 % 12/27/21 1425    Last Pain:  Vitals:   12/27/21 1351  TempSrc: Temporal  PainSc: 0-No pain         Complications: No notable events documented.

## 2021-12-27 NOTE — Progress Notes (Signed)
Patient ID: Whitney [name], female   DOB: November 24, 1941, 81 y.o.   MRN: [id number] [facility] PROGRESS NOTE  Lachelle Rissler YKZ:[id number] DOB: 29-Dec-1940 DOA: 12/26/2021 PCP: Marguarite Arbour, MD  HPI/Subjective: Called by nursing staff this morning about black stools.  Hemoglobin also dropped from 10.8 to 8.1.  Patient initially came in with chest pain and found to have pneumonia.  Objective: Vitals:   12/27/21 0959 12/27/21 1102  BP: 116/62 (!) 112/56  Pulse: 99 99  Resp: 17 17  Temp: 98.5 F (36.9 C) 98.9 F (37.2 C)  SpO2: 98% 100%    Intake/Output Summary (Last 24 hours) at 12/27/2021 1340 Last data filed at 12/27/2021 1101 Gross per 24 hour  Intake 2586.35 ml  Output 550 ml  Net 2036.35 ml   Filed Weights   12/26/21 1521 12/27/21 0954  Weight: 72.6 kg 80 kg    ROS: Review of Systems  Respiratory:  Positive for cough. Negative for shortness of breath.   Cardiovascular:  Negative for chest pain.  Gastrointestinal:  Positive for melena. Negative for abdominal pain, nausea and vomiting.  Exam: Physical Exam HENT:     Head: Normocephalic.     Mouth/Throat:     Pharynx: No oropharyngeal exudate.  Eyes:     General: Lids are normal.     Conjunctiva/sclera: Conjunctivae normal.  Cardiovascular:     Rate and Rhythm: Normal rate and regular rhythm.     Heart sounds: Normal heart sounds, S1 normal and S2 normal.  Pulmonary:     Breath sounds: Normal breath sounds. No decreased breath sounds, wheezing, rhonchi or rales.  Abdominal:     Palpations: Abdomen is soft.     Tenderness: There is no abdominal tenderness.  Musculoskeletal:     Right lower leg: No swelling.     Left lower leg: No swelling.  Skin:    General: Skin is warm.     Findings: No rash.  Neurological:     Mental Status: She is alert.      Scheduled Meds:  amantadine  100 mg Oral BID   buPROPion  150 mg Oral Daily   ferrous sulfate  325 mg Oral Q breakfast   insulin aspart  0-15 Units  Subcutaneous TID WC   insulin aspart  0-5 Units Subcutaneous QHS   mirabegron ER  50 mg Oral Daily   pantoprazole (PROTONIX) IV  40 mg Intravenous Q12H   prochlorperazine  10 mg Intravenous Once   vitamin B-12  500 mcg Oral Daily   Continuous Infusions:  azithromycin     cefTRIAXone (ROCEPHIN)  IV     lactated ringers 50 mL/hr at 12/27/21 0815   octreotide  (SANDOSTATIN)    IV infusion 50 mcg/hr (12/27/21 1215)    Assessment/Plan:  Severe sepsis, present on admission with atypical pneumonia, elevated lactic acid of 5.6, tachycardia and tachypnea.  Patient started on Rocephin and Zithromax.  Blood cultures negative for less than 12 hours. Melena with drop in hemoglobin.  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  Decrease rate of IV fluids.  Started on Protonix IV and octreotide drip.  Appreciate gastroenterology consultation.  Patient for endoscopy today.  Repeat at 12 noon hemoglobin stabilized at 8.1. Type 2 diabetes mellitus without complication on sliding scale insulin for right now Parkinson's disease on amantadine Positive urine analysis on Rocephin.  Follow-up urine culture. Depression on Wellbutrin MRI brain negative for acute event. Obesity with a BMI of 32.26     Code Status:  Code Status Orders  (From admission, onward)           Start     Ordered   12/26/21 2011  Full code  Continuous        12/26/21 2012           Code Status History     Date Active Date Inactive Code Status Order ID Comments User Context   08/03/2020 1142 08/04/2020 1958 Full Code [id number]  Geanie Logan, MD Inpatient      Family Communication: Updated daughter on the phone Disposition Plan: Status is: Inpatient  Consultants: Gastroenterology  Antibiotics: Rocephin and Zithromax  [employer]

## 2021-12-27 NOTE — Op Note (Signed)
[facility] Patient Name: Whitney [name] Procedure Date: 12/27/2021 2:11 PM MRN: [id number] Account #: [id number] Date of Birth: [birth year] Admit Type: Inpatient Age: 81 Room: [facility] ENDO ROOM 2 Gender: Female Note Status: Finalized Instrument Name: Upper Endoscope [id number] Procedure:             Upper GI endoscopy Indications:           Acute post hemorrhagic anemia, Melena, CIrrhosis of                         the liver, Portal venous hypertension Providers:             Benay Pike. Alice Reichert MD, MD Referring MD:          Leonie Douglas. Doy Hutching, MD (Referring MD) Medicines:             Propofol per Anesthesia Complications:         No immediate complications. Procedure:             Pre-Anesthesia Assessment:                        - The risks and benefits of the procedure and the                         sedation options and risks were discussed with the                         patient. All questions were answered and informed                         consent was obtained.                        - Patient identification and proposed procedure were                         verified prior to the procedure by the nurse. The                         procedure was verified in the procedure room.                        - ASA Grade Assessment: III - A patient with severe                         systemic disease.                        - After reviewing the risks and benefits, the patient                         was deemed in satisfactory condition to undergo the                         procedure.                        After obtaining informed consent, the endoscope was  passed under direct vision. Throughout the procedure,                         the patient's blood pressure, pulse, and oxygen                         saturations were monitored continuously. The Endoscope                         was introduced through the mouth, and advanced to  the                         third part of duodenum. The upper GI endoscopy was                         accomplished without difficulty. The patient tolerated                         the procedure well. Findings:      Grade II varices were found in the middle third of the esophagus and in       the lower third of the esophagus. Varices had no stigmata of bleeding,       so banding was not performed.      Scattered moderate inflammation characterized by erythema was found in       the gastric antrum.      Mild portal hypertensive gastropathy was found in the entire examined       stomach.      The examined duodenum was normal.      A 1 cm hiatal hernia was present.      There is no endoscopic evidence of varices in the cardia.      The exam was otherwise without abnormality. Impression:            - Grade II esophageal varices.                        - Gastritis.                        - Portal hypertensive gastropathy.                        - Normal examined duodenum.                        - 1 cm hiatal hernia.                        - The examination was otherwise normal.                        - No specimens collected. Recommendation:        - Return patient to hospital ward for ongoing care.                        - Check hemoglobin q 8 hours for one day.                        - Clear liquid diet.                        -  Continue present medications.                        - Following along with you. Procedure Code(s):     --- Professional ---                        [phone], Esophagogastroduodenoscopy, flexible,                         transoral; diagnostic, including collection of                         specimen(s) by brushing or washing, when performed                         (separate procedure) Diagnosis Code(s):     --- Professional ---                        K92.1, Melena (includes Hematochezia)                        D62, Acute posthemorrhagic anemia                         K44.9, Diaphragmatic hernia without obstruction or                         gangrene                        K31.89, Other diseases of stomach and duodenum                        K76.6, Portal hypertension                        K29.70, Gastritis, unspecified, without bleeding                        I85.00, Esophageal varices without bleeding CPT copyright 2019 American Medical Association. All rights reserved. The codes documented in this report are preliminary and upon coder review may  be revised to meet current compliance requirements. Efrain Sella MD, MD 12/27/2021 2:28:50 PM This report has been signed electronically. Number of Addenda: 0 Note Initiated On: 12/27/2021 2:11 PM Estimated Blood Loss:  Estimated blood loss: none.      [facility]

## 2021-12-27 NOTE — Consult Note (Signed)
GI Inpatient Consult Note  Reason for Consult: Anemia, Melena    Attending Requesting Consult: Dr. Loletha Grayer, MD  History of Present Illness: Whitney [name] is a 81 y.o. female seen for evaluation of anemia and melena at the request of Dr. Loletha Grayer. Patient has a PMH of Parkinson's disease, NIDDM, depression, hepatic cirrhosis, pulmonary hypertension, Vitamin D deficiency, and obesity. She presented to the [facility] ED yesterday afternoon for chief complaint of centralized chest pain described as pressure and tightness, waxing and waning in nature. She describes mild shortness of breath at baseline. She says she has felt poorly for about the past two weeks. Upon presentation to the ED, she was tachycardiac (120), tachypneic (21), BP 135/77, and O2 sats 98% on room air. Labs were significant for sodium 138, potassium 4.5, BUN 34, serum creatinine 0.62, hemoglobin 10.8, and platelets 157K. High-sensitivity troponin was negative. Lactic acid was markedly elevated at 5.6. Blood cultures x 2 negative to date. COVID-19/influenza swabs were negative. She met sepsis protocol and was placed on antibiotics and IV fluid boluses. CTA chest showed focal patchy ground-glass opacities throughout the lungs c/w infectious or inflammatory process. CT abd/pelvis without contrast showed mild wall thickening and pericolonic fat stranding involving the ascending colon c/w infectious or inflammatory colitis. There was also mention of cirrhosis with portal venous hypertension manifested by splenomegaly, gastric varices, and recanalization of umbilical vein. MRI brain with no acute changes. Per nursing staff, patient had one large volume black stool this morning. Repeat hemoglobin 8.1. GI consulted for further evaluation and management of black stool and anemia.   Patient seen and evaluated this morning resting comfortably in hospital bed. No family at bedside. Patient is able to tell me her name, age, current  location, and president of the [place]. She reports currently her pain level is 1/10 in severity. She describes pressure-type sensation. She denies any associated nausea, vomiting, heartburn, reflux, dysphagia, palpitations, chills, or diaphoresis. She last saw Dr. Bonna Gains 01/2021 for IDA and cirrhosis. She had previously declined bidirectional endoscopy. 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 It is unknown whether patient has esophageal varices. She reports she was not having any changes in her bowel habits prior to coming to the hospital - no diarrhea or constipation. She reports she moves her bowels appx 2 times per day. She has not seen any overt hematochezia or tarry stool. Unclear whether black BM this morning was tarry or not. She denies any abdominal pain. She reports she takes Aleve several times a week for general aches and pains. She has noticed slurred speech over the last few weeks.   Past Medical History:  Past Medical History:  Diagnosis Date   Anemia    Arthritis    Broken shoulder    left humerus    COVID-19    Depression    Diabetes mellitus without complication (HCC)    Goiter    Hyperlipidemia    Overactive bladder    Parkinson's disease ([place])    Pneumonia    Right arm weakness    limited movement   Unsteady gait     Problem List: Patient Active Problem List   Diagnosis Date Noted   Melena 12/27/2021   Atypical pneumonia 12/26/2021   Depression 12/26/2021   Sepsis ([place]) 12/26/2021   Iron deficiency anemia 12/26/2021   Hepatic cirrhosis ([place]) 12/26/2021   Slurred speech 12/26/2021   Pyuria 12/26/2021   Thyroid goiter 08/03/2020   Impaired mobility and  ADLs 04/15/2020   Vitamin D deficiency 07/20/2019   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[place]) 07/20/2019   At high risk for falls 07/20/2019   Parkinson's disease ([place]) 01/15/2019   Hyperlipidemia 01/15/2019   Diabetes mellitus type 2 in obese ([place]) 01/13/2019    Overactive bladder 01/13/2019   Obesity (BMI 30.0-34.9) 01/13/2019    Past Surgical History: Past Surgical History:  Procedure Laterality Date   [place]   post hysterectomy   NECK SURGERY     anterior cervical disc fusion   THYROIDECTOMY Left 08/03/2020   Procedure: THYROIDECTOMY-HEMI;  Surgeon: Clyde Canterbury, MD;  Location: [facility] ORS;  Service: ENT;  Laterality: Left;    Allergies: No Known Allergies  Home Medications: Medications Prior to Admission  Medication Sig Dispense Refill Last Dose   amantadine (SYMMETREL) 100 MG capsule Take 100 mg by mouth 2 (two) times daily.   12/26/2021 at 0900   buPROPion (WELLBUTRIN XL) 150 MG 24 hr tablet TAKE 1 TABLET(150 MG) BY MOUTH DAILY 90 tablet 1 12/26/2021 at 0900   cholecalciferol (VITAMIN D3) 25 MCG (1000 UNIT) tablet Take 1,000 Units by mouth daily.    12/26/2021 at 0900   ferrous sulfate 325 (65 FE) MG tablet Take 325 mg by mouth daily with breakfast.    12/26/2021 at 0900   metFORMIN (GLUCOPHAGE-XR) 500 MG 24 hr tablet Take 1 tablet (500 mg total) by mouth 2 (two) times daily with a meal. TAKE 1 TABLET(500 MG) BY MOUTH BID (Patient taking differently: Take 500 mg by mouth 2 (two) times daily. TAKE 1 TABLET(500 MG) BY MOUTH BID) 180 tablet 3 12/26/2021 at 0900   MYRBETRIQ 50 MG TB24 tablet TAKE 1 TABLET(50 MG) BY MOUTH DAILY 30 tablet 11 12/25/2021 at 1800   Trospium Chloride 60 MG CP24 TAKE 1 CAPSULE(60 MG) BY MOUTH DAILY 90 capsule 3 12/25/2021 at 1800   vitamin B-12 (CYANOCOBALAMIN) 500 MCG tablet Take 500 mcg by mouth daily.   12/26/2021 at 0900   ACCU-CHEK GUIDE test strip       Accu-Chek Softclix Lancets lancets       acetaminophen (TYLENOL) 500 MG tablet Take 500-1,000 mg by mouth every 6 (six) hours as needed (for pain.).   prn at unknown   Alcohol Swabs (B-D SINGLE USE SWABS REGULAR) PADS       Blood Glucose Monitoring Suppl (ACCU-CHEK GUIDE ME) w/Device KIT       meloxicam (MOBIC) 7.5 MG tablet  Take by mouth. (Patient not taking: Reported on 12/26/2021)   Not Taking   rOPINIRole (REQUIP) 0.25 MG tablet Take 0.5 mg by mouth 3 (three) times daily. (Patient not taking: Reported on 12/26/2021)   Not Taking   Home medication reconciliation was completed with the patient.   Scheduled Inpatient Medications:    amantadine  100 mg Oral BID   buPROPion  150 mg Oral Daily   ferrous sulfate  325 mg Oral Q breakfast   insulin aspart  0-15 Units Subcutaneous TID WC   insulin aspart  0-5 Units Subcutaneous QHS   mirabegron ER  50 mg Oral Daily   pantoprazole (PROTONIX) IV  40 mg Intravenous Q12H   prochlorperazine  10 mg Intravenous Once   vitamin B-12  500 mcg Oral Daily    Continuous Inpatient Infusions:    azithromycin     cefTRIAXone (ROCEPHIN)  IV     lactated ringers 50 mL/hr at 12/27/21 0815   octreotide  (  SANDOSTATIN)    IV infusion 50 mcg/hr (12/27/21 1215)    PRN Inpatient Medications:  acetaminophen **OR** acetaminophen, hydrALAZINE, morphine injection, ondansetron **OR** ondansetron (ZOFRAN) IV, rOPINIRole  Family History: family history includes AAA (abdominal aortic aneurysm) in her brother; Atrial fibrillation in her sister; Cancer in her brother; Colon cancer in her sister; Diabetes in her brother, father, sister, and sister; Heart disease in her brother, father, and sister; Hypertension in her mother; Kidney cancer in her daughter; Kidney disease in her daughter and sister; Lung cancer in her father; Pulmonary embolism in her sister; Stroke in her mother.  The patient's family history is negative for inflammatory bowel disorders, GI malignancy, or solid organ transplantation.  Social History:   reports that she quit smoking about 3 years ago. Her smoking use included cigarettes. She has a 80.00 pack-year smoking history. She has never used smokeless tobacco. She reports that she does not currently use alcohol. She reports that she does not use drugs. The patient denies  ETOH, tobacco, or drug use.   Review of Systems: Constitutional: Weight is stable.  Eyes: No changes in vision. ENT: No oral lesions, sore throat.  GI: see HPI.  Heme/Lymph: No easy bruising.  CV: No chest pain.  GU: No hematuria.  Integumentary: No rashes.  Neuro: No headaches.  Psych: No depression/anxiety.  Endocrine: No heat/cold intolerance.  Allergic/Immunologic: No urticaria.  Resp: No cough, SOB.  Musculoskeletal: No joint swelling.    Physical Examination: BP (!) 112/56 (BP Location: Right Arm)    Pulse 99    Temp 98.9 F (37.2 C) (Oral)    Resp 17    Ht '5\' 2"'  (1.575 m)    Wt 80 kg    SpO2 100%    BMI 32.26 kg/m  Gen: NAD, alert and oriented x 4, flat affect  HEENT: PEERLA, EOMI, Neck: supple, no JVD or thyromegaly Chest: CTA bilaterally, no wheezes, crackles, or other adventitious sounds CV: RRR, no m/g/c/r Abd: soft, NT, ND, +BS in all four quadrants; no HSM, guarding, ridigity, or rebound tenderness Ext: no edema, well perfused with 2+ pulses, Skin: no rash or lesions noted Lymph: no LAD  Data: Lab Results  Component Value Date   WBC 6.6 12/27/2021   HGB 8.1 (L) 12/27/2021   HCT 24.5 (L) 12/27/2021   MCV 88.4 12/27/2021   PLT 131 (L) 12/27/2021   Recent Labs  Lab 12/26/21 1525 12/27/21 0527 12/27/21 1216  HGB 10.8* 8.1* 8.1*   Lab Results  Component Value Date   NA 138 12/27/2021   K 4.5 12/27/2021   CL 109 12/27/2021   CO2 21 (L) 12/27/2021   BUN 35 (H) 12/27/2021   CREATININE 0.50 12/27/2021   Lab Results  Component Value Date   ALT 29 12/26/2021   AST 45 (H) 12/26/2021   ALKPHOS 91 12/26/2021   BILITOT 1.3 (H) 12/26/2021   Recent Labs  Lab 12/26/21 2147  INR 1.4*   CT abd/pelvis without contrast 12/26/21: IMPRESSION: 1. Mild wall thickening and pericolonic fat stranding involving the ascending colon, consistent with inflammatory or infectious colitis. 2. Cirrhosis, with portal venous hypertension manifested by splenomegaly,  gastric varices, and recanalization of the umbilical vein. 3. 6 mm left ground-glass left lower lobe pulmonary nodule. Recommend a non-contrast Chest CT at 6-12 months to confirm persistence, then additional non-contrast Chest CTs every 2 years until 5 years. If nodule grows or develops solid component(s), consider resection. These guidelines do not apply to immunocompromised patients and patients with  cancer. Follow up in patients with significant comorbidities as clinically warranted. For lung cancer screening, adhere to Lung-RADS guidelines. Reference: Radiology. 2017; 284(1):228-43. 4. Sigmoid diverticulosis without diverticulitis. 5.  Aortic Atherosclerosis (ICD10-I70.0).  Assessment/Plan:  81 y/o Caucasian female with a PMH of Parkinson's disease, NIDDM, depression, hepatic cirrhosis, pulmonary hypertension, Vitamin D deficiency, and obesity presented to the [facility] ED yesterday afternoon for chief complaint of centralized chest pain found to meet sepsis protocol likely 2/2 atypical pneumonia. GI consulted in context of large black BM and drop in hemoglobin concerning for GI bleed.   Melena/Anemia - drop in hemoglobin overnight from 10.8 to 8.1. Black BM c/w melena and drop in hemoglobin concerning for GI bleed. No overt hemorrhage/GIB. DDx includes peptic ulcer disease, gastritis, erosive esophagitis, AVMs, varices, Dieulafoy's lesion, neoplasm, polyp, or less likely lower GI beed from colon.   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  - Continue IV PPI for gastric protection. Continue Octreotide for now given potential for variceal bleeding.  - Advise EGD for luminal evaluation and potential endoscopic hemostasis. I called patient's daughter and HPOA, Craig Staggers, and discussed procedure details and indications over the phone. She consents to proceed.  - NPO - Plan for EGD this afternoon with Dr. Alice Reichert - Further recommendations after procedure - Following along with you  I reviewed the risks (including bleeding, perforation, infection, anesthesia complications, cardiac/respiratory complications), benefits and alternatives of EGD. Patient and her daughter consent to proceed.    Thank you for the consult. Please call with questions or concerns.  [facility] [phone] (Cell)

## 2021-12-28 ENCOUNTER — Encounter: Payer: Self-pay | Admitting: Internal Medicine

## 2021-12-28 ENCOUNTER — Ambulatory Visit: Admission: RE | Admit: 2021-12-28 | Payer: Medicare PPO | Source: Ambulatory Visit

## 2021-12-28 DIAGNOSIS — R531 Weakness: Secondary | ICD-10-CM

## 2021-12-28 DIAGNOSIS — I8500 Esophageal varices without bleeding: Secondary | ICD-10-CM

## 2021-12-28 DIAGNOSIS — K7469 Other cirrhosis of liver: Secondary | ICD-10-CM

## 2021-12-28 DIAGNOSIS — I8510 Secondary esophageal varices without bleeding: Secondary | ICD-10-CM

## 2021-12-28 LAB — CBC
HCT: 22.6 % — ABNORMAL LOW (ref 36.0–46.0)
Hemoglobin: 7.3 g/dL — ABNORMAL LOW (ref 12.0–15.0)
MCH: 28.3 pg (ref 26.0–34.0)
MCHC: 32.3 g/dL (ref 30.0–36.0)
MCV: 87.6 fL (ref 80.0–100.0)
Platelets: 97 10*3/uL — ABNORMAL LOW (ref 150–400)
RBC: 2.58 MIL/uL — ABNORMAL LOW (ref 3.87–5.11)
RDW: 16.9 % — ABNORMAL HIGH (ref 11.5–15.5)
WBC: 2.9 10*3/uL — ABNORMAL LOW (ref 4.0–10.5)
nRBC: 0 % (ref 0.0–0.2)

## 2021-12-28 LAB — BASIC METABOLIC PANEL
Anion gap: 7 (ref 5–15)
BUN: 16 mg/dL (ref 8–23)
CO2: 24 mmol/L (ref 22–32)
Calcium: 8 mg/dL — ABNORMAL LOW (ref 8.9–10.3)
Chloride: 110 mmol/L (ref 98–111)
Creatinine, Ser: 0.51 mg/dL (ref 0.44–1.00)
GFR, Estimated: >60 mL/min (ref >60)
Glucose, Bld: 139 mg/dL — ABNORMAL HIGH (ref 70–99)
Potassium: 3.7 mmol/L (ref 3.5–5.1)
Sodium: 141 mmol/L (ref 135–145)

## 2021-12-28 LAB — LACTIC ACID, PLASMA: Lactic Acid, Venous: 1.4 mmol/L (ref 0.5–1.9)

## 2021-12-28 LAB — TYPE AND SCREEN
ABO/RH(D): A NEG
Antibody Screen: NEGATIVE

## 2021-12-28 LAB — GLUCOSE, CAPILLARY
Glucose-Capillary: 138 mg/dL — ABNORMAL HIGH (ref 70–99)
Glucose-Capillary: 217 mg/dL — ABNORMAL HIGH (ref 70–99)
Glucose-Capillary: 153 mg/dL — ABNORMAL HIGH (ref 70–99)
Glucose-Capillary: 180 mg/dL — ABNORMAL HIGH (ref 70–99)

## 2021-12-28 LAB — LEGIONELLA PNEUMOPHILA SEROGP 1 UR AG: L. pneumophila Serogp 1 Ur Ag: NEGATIVE

## 2021-12-28 MED ORDER — AZITHROMYCIN 250 MG PO TABS: 250.0000 mg | ORAL_TABLET | Freq: Every evening | ORAL | Status: DC

## 2021-12-28 MED ORDER — AZITHROMYCIN 250 MG PO TABS
500.0000 mg | ORAL_TABLET | Freq: Every evening | ORAL | Status: DC
Start: 2021-12-28 — End: 2021-12-29
  Administered 2021-12-28: 500 mg via ORAL
  Filled 2021-12-28: qty 2

## 2021-12-28 NOTE — Progress Notes (Signed)
Patient ID: Whitney [name], female   DOB: 1941/01/22, 80 y.o.   MRN: [id number] [facility] PROGRESS NOTE  Whitney [name] [id number] DOB: 1941/01/10 DOA: 12/26/2021 PCP: Idelle Crouch, MD  HPI/Subjective: Patient feeling okay.  Interested in going home.  Hemoglobin drifted down again this morning to 7.3.  Patient declined blood transfusion.  Patient admitted with severe sepsis atypical pneumonia then had a drop in hemoglobin with black stool.  Objective: Vitals:   12/28/21 0841 12/28/21 1214  BP: (!) 106/59 115/68  Pulse: 86 91  Resp: 17 17  Temp: 98 F (36.7 C) 98 F (36.7 C)  SpO2: 100% 98%    Intake/Output Summary (Last 24 hours) at 12/28/2021 1246 Last data filed at 12/28/2021 1216 Gross per 24 hour  Intake 2240.05 ml  Output 3150 ml  Net -909.95 ml   Filed Weights   12/26/21 1521 12/27/21 0954  Weight: 72.6 kg 80 kg    ROS: Review of Systems  Respiratory:  Positive for cough and shortness of breath.   Cardiovascular:  Negative for chest pain.  Gastrointestinal:  Positive for abdominal pain. Negative for nausea and vomiting.  Exam: Physical Exam HENT:     Head: Normocephalic.     Mouth/Throat:     Pharynx: No oropharyngeal exudate.  Eyes:     General: Lids are normal.     Pupils: Pupils are equal, round, and reactive to light.     Comments: Pale conjunctiva  Cardiovascular:     Rate and Rhythm: Normal rate and regular rhythm.     Heart sounds: Normal heart sounds, S1 normal and S2 normal.  Pulmonary:     Breath sounds: No decreased breath sounds, wheezing, rhonchi or rales.  Abdominal:     Palpations: Abdomen is soft.     Tenderness: There is no abdominal tenderness.  Musculoskeletal:     Right lower leg: No swelling.     Left lower leg: No swelling.  Skin:    General: Skin is warm.     Findings: No rash.  Neurological:     Mental Status: She is alert and oriented to person, place, and time.      Scheduled Meds:  amantadine  100 mg Oral  BID   azithromycin  250 mg Oral QPM   buPROPion  150 mg Oral Daily   ferrous sulfate  325 mg Oral Q breakfast   insulin aspart  0-15 Units Subcutaneous TID WC   insulin aspart  0-5 Units Subcutaneous QHS   mirabegron ER  50 mg Oral Daily   pantoprazole (PROTONIX) IV  40 mg Intravenous Q12H   prochlorperazine  10 mg Intravenous Once   vitamin B-12  500 mcg Oral Daily   Continuous Infusions:  sodium chloride Stopped (12/28/21 0238)   cefTRIAXone (ROCEPHIN)  IV Stopped (12/27/21 2220)   octreotide  (SANDOSTATIN)    IV infusion 50 mcg/hr (12/28/21 1232)    Assessment/Plan:  Severe sepsis, present on admission with atypical pneumonia, elevated lactic acid of 5.6, tachycardia and tachypnea.  Continue Rocephin and Zithromax.  Blood cultures negative for 2 days.  Acute cystitis with hematuria, urine culture growing Enterobacter which Rocephin should be sensitive to initially but likely will have to go over to Cipro or Levaquin upon disposition. Melena with drop in hemoglobin.  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  EGD shows esophageal varices, portal hypertensive gastropathy and gastritis.  Continue Protonix IV and octreotide drips.  Advance to full liquid diet.  Hemoglobin drifted down to 7.3.  Discussed benefits and risks of blood transfusion to patient and daughter and the patient declined a blood transfusion today.  We will recheck hemoglobin tomorrow.  Discontinue IV fluids. Type 2 diabetes mellitus without complication on sliding scale insulin for right now Weakness.  We will get physical therapy evaluation Parkinson's disease on amantadine Depression on Wellbutrin Obesity with a BMI of 32.26       Code Status:     Code Status Orders  (From admission, onward)           Start     Ordered   12/26/21 2011  Full code  Continuous        12/26/21 2012           Code Status History     Date Active Date Inactive Code Status Order ID Comments User Context   08/03/2020  1142 08/04/2020 1958 Full Code [id number]  Clyde Canterbury, MD Inpatient      Family Communication: Updated patient's daughter at the bedside Disposition Plan: Status is: Inpatient   Consultants: Gastroenterology  Procedures: EGD  Antibiotics: Rocephin and Zithromax  Markiya Keefe [employer]

## 2021-12-28 NOTE — Evaluation (Signed)
Physical Therapy Evaluation Patient Details Name: Whitney [name] MRN: [id number] DOB: [birth year] Today's Date: 12/28/2021  History of Present Illness  Per ED physician: Whitney [name] is a 81 y.o. female with diabetes presents to the ER for evaluation of this chest pain states that she feels pressure tightness in her chest that started.  His symptoms started while she was walking at home.  Feels like it has eased off but still having discomfort. PMH includes Parkinson Disease.   Clinical Impression  Pt admitted with above diagnosis. Pt received with daughter present at bed side. Agreeable to PT services. With daughter's help for clarity, pt able to report PLOF, DME, assist level and home lay out. Pt reports at baseline she has caregivers throughout the day until 9 pm that help with ADL's/IADL's. Reports she typically stands and step transfers from surfaces using electric scooter for mobility. Pt states she is typically able to transfer indep but sometimes receives help from caregivers. Pt ultimately relied on minA with HOB elevated and cuing for LE/UE sequencing to transfer to EOB. Once feet on floor, pt maintains static sitting with supervision. Able to stand to RW with minguard and cues for hand placement maintaining static standing at RW with supervision. Pt required increased time and minA at [facility] for sequencing and mod multimodal cues for steps. Pt displaying shuffling gait with steps throughout with minimal foot clearance but is able to reach recliner without LOB. Due to excellent care giver support at home at baseline, and ability to transfer to recliner with minimal assist, anticipate pt is safe to d/c home when medically stable. Pt and daughter amenable to [facility] PT as pt reports desire to improve mobility to return to walking. Pt currently with functional limitations due to the deficits listed below (see PT Problem List). Pt will benefit from skilled PT to increase their independence and safety with  mobility to allow discharge to the venue listed below.      Recommendations for follow up therapy are one component of a multi-disciplinary discharge planning process, led by the attending physician.  Recommendations may be updated based on patient status, additional functional criteria and insurance authorization.  Follow Up Recommendations Home health PT    Assistance Recommended at Discharge Frequent or constant Supervision/Assistance  Patient can return home with the following  A little help with walking and/or transfers;Assistance with cooking/housework;A little help with bathing/dressing/bathroom;Direct supervision/assist for financial management;Assist for transportation;Direct supervision/assist for medications management;Help with stairs or ramp for entrance    Equipment Recommendations None recommended by PT  Recommendations for Other Services       Functional Status Assessment Patient has had a recent decline in their functional status and/or demonstrates limited ability to make significant improvements in function in a reasonable and predictable amount of time     Precautions / Restrictions Precautions Precautions: Fall Restrictions Weight Bearing Restrictions: No      Mobility  Bed Mobility Overal bed mobility: Needs Assistance Bed Mobility: Supine to Sit     Supine to sit: HOB elevated, Min assist     General bed mobility comments: MinA for LE's to EOB and for truncal support. Patient Response: Cooperative, Flat affect  Transfers Overall transfer level: Needs assistance Equipment used: Rolling walker (2 wheels) Transfers: Sit to/from Stand, Bed to chair/wheelchair/BSC Sit to Stand: Min guard   Step pivot transfers: Min assist       General transfer comment: Cuing for hand placement for standing. increased time and minA at [facility] for sequencing with steps  to reach recliner.    Ambulation/Gait               General Gait Details: deferred  Stairs             Wheelchair Mobility    Modified Rankin (Stroke Patients Only)       Balance Overall balance assessment: Needs assistance Sitting-balance support: Bilateral upper extremity supported, Feet supported Sitting balance-Leahy Scale: Fair Sitting balance - Comments: close supervision   Standing balance support: During functional activity Standing balance-Leahy Scale: Fair Standing balance comment: Light touch on RW to maintain static standing.                             Pertinent Vitals/Pain Pain Assessment Pain Assessment: No/denies pain    Home Living Family/patient expects to be discharged to:: Private residence Living Arrangements: Alone Available Help at Discharge: Personal care attendant (from 8 or 9 am to 9 pm) Type of Home: House Home Access: Ramped entrance       Home Layout: One level Home Equipment: Conservation officer, nature (2 wheels);Rollator (4 wheels);Shower seat;Wheelchair - Press photographer;Toilet riser Additional Comments: grab bar on bed    Prior Function Prior Level of Function : Needs assist       Physical Assist : Mobility (physical);ADLs (physical) Mobility (physical): Gait ADLs (physical): Grooming;Bathing;Dressing;Toileting;IADLs Mobility Comments: Relies on grab bars and electric scooter for mobility. Reports ambulating very short distances. ADLs Comments: Caregivers assist in some dressing, toileting, transfers.     Hand Dominance        Extremity/Trunk Assessment   Upper Extremity Assessment Upper Extremity Assessment: Defer to OT evaluation    Lower Extremity Assessment Lower Extremity Assessment: Generalized weakness       Communication   Communication: No difficulties  Cognition Arousal/Alertness: Awake/alert Behavior During Therapy: WFL for tasks assessed/performed Overall Cognitive Status: Within Functional Limits for tasks assessed                                 General Comments:  Keeps eyes closed intermittently when asking subjective questions and during mobility.        General Comments      Exercises Other Exercises Other Exercises: Role of PT in acute setting, D/c recs.   Assessment/Plan    PT Assessment Patient needs continued PT services  PT Problem List Decreased strength;Decreased activity tolerance;Decreased safety awareness;Decreased mobility       PT Treatment Interventions DME instruction;Therapeutic exercise;Gait training;Balance training;Stair training;Neuromuscular re-education;Functional mobility training;Therapeutic activities;Patient/family education    PT Goals (Current goals can be found in the Care Plan section)  Acute Rehab PT Goals Patient Stated Goal: To improve mobility and get home PT Goal Formulation: With patient/family Time For Goal Achievement: 01/11/22 Potential to Achieve Goals: Good    Frequency Min 2X/week     Co-evaluation               AM-PAC PT "6 Clicks" Mobility  Outcome Measure Help needed turning from your back to your side while in a flat bed without using bedrails?: A Lot Help needed moving from lying on your back to sitting on the side of a flat bed without using bedrails?: A Lot Help needed moving to and from a bed to a chair (including a wheelchair)?: A Little Help needed standing up from a chair using your arms (e.g., wheelchair or bedside chair)?: A Little Help  needed to walk in hospital room?: A Lot Help needed climbing 3-5 steps with a railing? : Total 6 Click Score: 13    End of Session Equipment Utilized During Treatment: Gait belt Activity Tolerance: Patient tolerated treatment well Patient left: in chair;with call bell/phone within reach;with family/visitor present Nurse Communication: Mobility status PT Visit Diagnosis: Unsteadiness on feet (R26.81);Difficulty in walking, not elsewhere classified (R26.2);Other abnormalities of gait and mobility (R26.89);Muscle weakness (generalized)  (M62.81)    Time: [id number] PT Time Calculation (min) (ACUTE ONLY): 35 min   Charges:   PT Evaluation $PT Eval Moderate Complexity: 1 Mod PT Treatments $Therapeutic Activity: 8-22 mins        Momin Misko M. Fairly IV, PT, DPT Physical Therapist- [facility]  12/28/2021, 3:02 PM

## 2021-12-29 DIAGNOSIS — R1013 Epigastric pain: Secondary | ICD-10-CM

## 2021-12-29 DIAGNOSIS — N3001 Acute cystitis with hematuria: Secondary | ICD-10-CM

## 2021-12-29 LAB — GLUCOSE, CAPILLARY
Glucose-Capillary: 139 mg/dL — ABNORMAL HIGH (ref 70–99)
Glucose-Capillary: 228 mg/dL — ABNORMAL HIGH (ref 70–99)
Glucose-Capillary: 225 mg/dL — ABNORMAL HIGH (ref 70–99)
Glucose-Capillary: 197 mg/dL — ABNORMAL HIGH (ref 70–99)

## 2021-12-29 LAB — BASIC METABOLIC PANEL
Anion gap: 5 (ref 5–15)
BUN: 9 mg/dL (ref 8–23)
CO2: 25 mmol/L (ref 22–32)
Calcium: 7.7 mg/dL — ABNORMAL LOW (ref 8.9–10.3)
Chloride: 110 mmol/L (ref 98–111)
Creatinine, Ser: 0.5 mg/dL (ref 0.44–1.00)
GFR, Estimated: >60 mL/min (ref >60)
Glucose, Bld: 146 mg/dL — ABNORMAL HIGH (ref 70–99)
Potassium: 3.5 mmol/L (ref 3.5–5.1)
Sodium: 140 mmol/L (ref 135–145)

## 2021-12-29 LAB — CBC
HCT: 23.9 % — ABNORMAL LOW (ref 36.0–46.0)
Hemoglobin: 7.7 g/dL — ABNORMAL LOW (ref 12.0–15.0)
MCH: 28.7 pg (ref 26.0–34.0)
MCHC: 32.2 g/dL (ref 30.0–36.0)
MCV: 89.2 fL (ref 80.0–100.0)
Platelets: 90 10*3/uL — ABNORMAL LOW (ref 150–400)
RBC: 2.68 MIL/uL — ABNORMAL LOW (ref 3.87–5.11)
RDW: 17 % — ABNORMAL HIGH (ref 11.5–15.5)
WBC: 2.6 10*3/uL — ABNORMAL LOW (ref 4.0–10.5)
nRBC: 0 % (ref 0.0–0.2)

## 2021-12-29 LAB — URINE CULTURE: Culture: >=100000 — AB

## 2021-12-29 MED ORDER — LEVOFLOXACIN 500 MG PO TABS
500.0000 mg | ORAL_TABLET | Freq: Every day | ORAL | Status: DC
  Administered 2021-12-29 – 2021-12-30 (×2): 500 mg via ORAL
  Filled 2021-12-29 (×2): qty 1

## 2021-12-29 MED ORDER — PANTOPRAZOLE SODIUM 40 MG PO TBEC
40.0000 mg | DELAYED_RELEASE_TABLET | Freq: Every day | ORAL | Status: DC
  Administered 2021-12-30: 40 mg via ORAL
  Filled 2021-12-29: qty 1

## 2021-12-29 NOTE — Care Management Important Message (Signed)
Important Message  Patient Details  Name: Whitney [name] MRN: [id number] Date of Birth: 20-Dec-1940   Medicare Important Message Given:  N/A - LOS <3 / Initial given by admissions     [name] 12/29/2021, 10:13 AM

## 2021-12-29 NOTE — Progress Notes (Signed)
PT Cancellation Note  Patient Details Name: Whitney [name] MRN: [id number] DOB: 08/20/1941   Cancelled Treatment:    Reason Eval/Treat Not Completed: Other (comment). Pt awaiting OT eval. Pt reports probably only being able to tolerate either PT treatment or OT eval. PT plans to hold as OT eval would be beneficial for pt. Pt and pt's daughter amenable. Will continue pt POC and attempt as able.    Salem Caster. Fairly IV, PT, DPT Physical Therapist- [facility]  12/29/2021, 2:40 PM

## 2021-12-29 NOTE — Progress Notes (Signed)
Patient ID: Whitney [name], female   DOB: 1941-06-26, 81 y.o.   MRN: [id number] [facility] PROGRESS NOTE  Whitney [name] [id number] DOB: 1941-01-14 DOA: 12/26/2021 PCP: Idelle Crouch, MD  HPI/Subjective: Patient still having a little abdominal pain.  No nausea or vomiting.  Complains of dry mouth.  No diarrhea.  Objective: Vitals:   12/29/21 0759 12/29/21 1143  BP: 115/61 (!) 111/59  Pulse: 77 79  Resp: 15 16  Temp: 98.2 F (36.8 C) 98.3 F (36.8 C)  SpO2: 98%     Intake/Output Summary (Last 24 hours) at 12/29/2021 1444 Last data filed at 12/28/2021 1837 Gross per 24 hour  Intake 1345.88 ml  Output 600 ml  Net 745.88 ml   Filed Weights   12/26/21 1521 12/27/21 0954  Weight: 72.6 kg 80 kg    ROS: Review of Systems  Respiratory:  Negative for shortness of breath.   Cardiovascular:  Negative for chest pain.  Gastrointestinal:  Positive for abdominal pain. Negative for diarrhea, nausea and vomiting.  Exam: Physical Exam HENT:     Head: Normocephalic.     Mouth/Throat:     Pharynx: No oropharyngeal exudate.  Eyes:     General: Lids are normal.     Conjunctiva/sclera: Conjunctivae normal.  Cardiovascular:     Rate and Rhythm: Normal rate and regular rhythm.     Heart sounds: Normal heart sounds, S1 normal and S2 normal.  Pulmonary:     Breath sounds: No decreased breath sounds, wheezing, rhonchi or rales.  Abdominal:     Palpations: Abdomen is soft.     Tenderness: There is no abdominal tenderness.  Musculoskeletal:     Right lower leg: Swelling present.     Left lower leg: Swelling present.  Skin:    General: Skin is warm.     Findings: No rash.  Neurological:     Mental Status: She is alert and oriented to person, place, and time.      Scheduled Meds:  amantadine  100 mg Oral BID   buPROPion  150 mg Oral Daily   ferrous sulfate  325 mg Oral Q breakfast   insulin aspart  0-15 Units Subcutaneous TID WC   insulin aspart  0-5 Units Subcutaneous  QHS   levofloxacin  500 mg Oral Daily   mirabegron ER  50 mg Oral Daily   [START ON 12/30/2021] pantoprazole  40 mg Oral Daily   prochlorperazine  10 mg Intravenous Once   vitamin B-12  500 mcg Oral Daily   Continuous Infusions:  sodium chloride 5 mL/hr at 12/28/21 2140    Assessment/Plan:  Severe sepsis, present on admission with atypical pneumonia elevated lactic acid of 5.6, tachycardia and tachypnea.  Patient also has acute cystitis with hematuria urine culture growing Enterobacter which was resistant to Rocephin.  We will change Rocephin and Zithromax over to Levaquin for 3 days Melena with drop in hemoglobin.  Cirrhosis seen on liver with imaging study.  EGD showing esophageal varices, portal hypertensive gastropathy and gastritis.  Hemoglobin came up a little bit to 7.7.  Finished octreotide drip today.  Change Protonix to p.o. Thrombocytopenia could be secondary to liver cirrhosis.  Change Protonix to p.o. Abdominal pain.  Could be secondary to gastritis.  Colitis seen on CT scan but no diarrhea.  Levaquin would cover.  No rash seen so I doubt this is shingles.  Continue to monitor.  Advance diet to regular. Type 2 diabetes mellitus without complication on sliding insulin scale for right  now. Weakness.  Physical therapy recommending home health Depression on Wellbutrin Parkinson's disease on amantadine Obesity with a BMI of 32.26        Code Status:     Code Status Orders  (From admission, onward)           Start     Ordered   12/26/21 2011  Full code  Continuous        12/26/21 2012           Code Status History     Date Active Date Inactive Code Status Order ID Comments User Context   08/03/2020 1142 08/04/2020 1958 Full Code [id number]  Clyde Canterbury, MD Inpatient      Family Communication: Updated patient's daughter [name] on the phone Disposition Plan: Status is: Inpatient.  Evaluate tomorrow for potential disposition.  Check CBC again tomorrow  morning  Case discussed with pharmacist switching antibiotics and stopping octreotide drip today and switching Protonix to p.o.  [place]  [facility] [employer]

## 2021-12-29 NOTE — TOC Initial Note (Signed)
Transition of Care [facility]) - Initial/Assessment Note    Patient Details  Name: Whitney [name] MRN: [id number] Date of Birth: [birth year]  Transition of [facility]) CM/SW Contact:    Alberteen Sam, LCSW Phone Number: 12/29/2021, 12:02 PM  Clinical Narrative:                  CSW spoke with patient regarding home health, she reports she is currently active with [facility] for PT and OT. Would like to resume services at time of discharge.   CSW has reached out to [facility] with [facility] to inform.   No further discharge needs identified at this time.    Expected Discharge Plan: [place] Barriers to Discharge: Continued Medical Work up   Patient Goals and CMS Choice Patient states their goals for this hospitalization and ongoing recovery are:: to go home CMS Medicare.gov Compare Post Acute Care list provided to:: Patient Choice offered to / list presented to : Patient  Expected Discharge Plan and Services Expected Discharge Plan: [place] Arranged: PT, RN [facility] Agency: [facility] Date [facility] Agency Contacted: 12/29/21 Time HH Agency Contacted: 1202 Representative spoke with at [place]: Merleen Nicely  Prior Living Arrangements/Services   Lives with:: Self   Do you feel safe going back to the place where you live?: Yes               Activities of Daily Living Home Assistive Devices/Equipment: Grab bars around toilet, Shower chair without back, Wheelchair, Built-in shower seat, Other (Comment) (motorized scooter) ADL Screening (condition at time of admission) Patient's cognitive ability adequate to safely complete daily activities?: Yes Is the patient deaf or have difficulty hearing?: No Does the patient have difficulty seeing, even when wearing glasses/contacts?: No Does the patient have difficulty concentrating, remembering, or making decisions?: No Patient able to express need for assistance with  ADLs?: Yes Does the patient have difficulty dressing or bathing?: Yes Independently performs ADLs?: No Communication: Needs assistance Is this a change from baseline?: Pre-admission baseline Dressing (OT): Needs assistance Is this a change from baseline?: Pre-admission baseline Grooming: Needs assistance Is this a change from baseline?: Pre-admission baseline Feeding: Independent Bathing: Needs assistance Is this a change from baseline?: Pre-admission baseline Toileting: Needs assistance Is this a change from baseline?: Pre-admission baseline In/Out Bed: Needs assistance Is this a change from baseline?: Pre-admission baseline Walks in Home: Needs assistance Is this a change from baseline?: Pre-admission baseline Does the patient have difficulty walking or climbing stairs?: Yes Weakness of Legs: Both Weakness of Arms/Hands: Both  Permission Sought/Granted                  Emotional Assessment   Attitude/Demeanor/Rapport: Gracious Affect (typically observed): Calm Orientation: : Oriented to Self, Oriented to Place, Oriented to  Time, Oriented to Situation Alcohol / Substance Use: Not Applicable Psych Involvement: No (comment)  Admission diagnosis:  Atypical pneumonia [J18.9] Sepsis, due to unspecified organism, unspecified whether acute organ dysfunction present [facility]) [A41.9] Melena [K92.1] Patient Active Problem List   Diagnosis Date Noted   Secondary esophageal varices without bleeding ([place])    Weakness    Melena 12/27/2021   Lactic acidosis    Drop in hemoglobin    Type 2 diabetes mellitus without complication, without  long-term current use of insulin ([place])    Atypical pneumonia 12/26/2021   Depression 12/26/2021   Severe sepsis ([place]) 12/26/2021   Iron deficiency anemia 12/26/2021   Hepatic cirrhosis ([place]) 12/26/2021   Slurred speech 12/26/2021   Pyuria 12/26/2021   Thyroid goiter 08/03/2020   Impaired mobility and ADLs 04/15/2020   Vitamin D deficiency  07/20/2019   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[place]) 07/20/2019   At high risk for falls 07/20/2019   Parkinson's disease ([place]) 01/15/2019   Hyperlipidemia 01/15/2019   Diabetes mellitus type 2 in obese ([place]) 01/13/2019   Overactive bladder 01/13/2019   Obesity (BMI 30.0-34.9) 01/13/2019   PCP:  Idelle Crouch, MD Pharmacy:   [facility] [phone] - [address], [address] [place] [address] Phone: [phone] Fax: [phone]  [place], [place] [address] [place] [address] Phone: [phone] Fax: [phone]     Social Determinants of Health (SDOH) Interventions    Readmission Risk Interventions No flowsheet data found.

## 2021-12-30 DIAGNOSIS — D61818 Other pancytopenia: Secondary | ICD-10-CM

## 2021-12-30 LAB — HEPATIC FUNCTION PANEL
ALT: 29 U/L (ref 0–44)
AST: 48 U/L — ABNORMAL HIGH (ref 15–41)
Albumin: 2.6 g/dL — ABNORMAL LOW (ref 3.5–5.0)
Alkaline Phosphatase: 68 U/L (ref 38–126)
Bilirubin, Direct: 0.2 mg/dL (ref 0.0–0.2)
Indirect Bilirubin: 0.6 mg/dL (ref 0.3–0.9)
Total Bilirubin: 0.8 mg/dL (ref 0.3–1.2)
Total Protein: 5.2 g/dL — ABNORMAL LOW (ref 6.5–8.1)

## 2021-12-30 LAB — CBC
HCT: 23.3 % — ABNORMAL LOW (ref 36.0–46.0)
Hemoglobin: 7.5 g/dL — ABNORMAL LOW (ref 12.0–15.0)
MCH: 28.8 pg (ref 26.0–34.0)
MCHC: 32.2 g/dL (ref 30.0–36.0)
MCV: 89.6 fL (ref 80.0–100.0)
Platelets: 90 10*3/uL — ABNORMAL LOW (ref 150–400)
RBC: 2.6 MIL/uL — ABNORMAL LOW (ref 3.87–5.11)
RDW: 17.2 % — ABNORMAL HIGH (ref 11.5–15.5)
WBC: 3.1 10*3/uL — ABNORMAL LOW (ref 4.0–10.5)
nRBC: 0 % (ref 0.0–0.2)

## 2021-12-30 LAB — GLUCOSE, CAPILLARY
Glucose-Capillary: 154 mg/dL — ABNORMAL HIGH (ref 70–99)
Glucose-Capillary: 186 mg/dL — ABNORMAL HIGH (ref 70–99)

## 2021-12-30 LAB — AMMONIA: Ammonia: 54 umol/L — ABNORMAL HIGH (ref 9–35)

## 2021-12-30 MED ORDER — LEVOFLOXACIN 500 MG PO TABS: 500.0000 mg | ORAL_TABLET | Freq: Every day | ORAL | 0 refills | Status: AC

## 2021-12-30 MED ORDER — LACTULOSE 10 GM/15ML PO SOLN: 10.0000 g | Freq: Every day | ORAL | Status: DC

## 2021-12-30 MED ORDER — PANTOPRAZOLE SODIUM 40 MG PO TBEC: 40.0000 mg | DELAYED_RELEASE_TABLET | Freq: Every day | ORAL | 0 refills | Status: DC

## 2021-12-30 MED ORDER — LACTULOSE 10 GM/15ML PO SOLN: 10.0000 g | Freq: Every day | ORAL | 0 refills | Status: DC

## 2021-12-30 NOTE — TOC Transition Note (Signed)
Transition of Care [facility]) - CM/SW Discharge Note   Patient Details  Name: Genee Rann MRN: [id number] Date of Birth: 01-20-1941  [facility]) CM/SW Contact:  Liliana Cline, LCSW Phone Number: 12/30/2021, 10:17 AM   Clinical Narrative:   Patient to DC home today. CSW notified Adelina Mings with [facility]. HH orders are in.    Final next level of care: Home w Home Health Services Barriers to Discharge: Barriers Resolved   Patient Goals and CMS Choice Patient states their goals for this hospitalization and ongoing recovery are:: home with home health CMS Medicare.gov Compare Post Acute Care list provided to:: Patient Choice offered to / list presented to : Patient  Discharge Placement                       Discharge Plan and Services                          HH Arranged: PT, OT [facility] Agency: [facility] Date [facility] Agency Contacted: 12/30/21 Time HH Agency Contacted: 1202 Representative spoke with at [facility] Agency: Adelina Mings  Social Determinants of Health (SDOH) Interventions     Readmission Risk Interventions No flowsheet data found.

## 2021-12-30 NOTE — Discharge Instructions (Signed)
Patient has an increased level of Ammonia and will be on Lactulose.

## 2021-12-30 NOTE — Progress Notes (Signed)
Patient A*O*4 this morning. NH3 levels stated as increased. Doctor will place patient on Lactulose at Discharge. Discharge orders put in.  Daughter present in room

## 2021-12-30 NOTE — Evaluation (Signed)
Occupational Therapy Evaluation Patient Details Name: Whitney [name] MRN: [id number] DOB: 12-27-1940 Today's Date: 12/30/2021   History of Present Illness Per ED physician: Whitney [name] is a 81 y.o. female with diabetes presents to the ER for evaluation of this chest pain states that she feels pressure tightness in her chest that started.  His symptoms started while she was walking at home.  Feels like it has eased off but still having discomfort. PMH includes Parkinson Disease. Adm for Sepsis.   Clinical Impression   Pt seen for OT evaluation on 12/29/21 in setting of acute hospitalization d/t sepsis. Pt presents this date reporting feeling better. She requires MIN A to [place] for ADL transfers with RW. Demos F standing balance and is able to tolerate being engaged in standing ADLs. Currently, on ADL assessment, she is requiring: SETUP for seated g/h, MIN A for seated UB bathing/dressing, MOD A for seated LB dressing-donning socks, MOD A for standing LB bathing with RW for UE support. Pt returned to bed end of session with all needs met and in reach. Caregiver present throughout. Will continue to follow acutely. Anticipate pt will be appropriate to return home with [facility] and caregiver support.      Recommendations for follow up therapy are one component of a multi-disciplinary discharge planning process, led by the attending physician.  Recommendations may be updated based on patient status, additional functional criteria and insurance authorization.   Follow Up Recommendations  Home health OT    Assistance Recommended at Discharge Frequent or constant Supervision/Assistance  Patient can return home with the following A little help with bathing/dressing/bathroom;Assistance with cooking/housework;Direct supervision/assist for medications management;Direct supervision/assist for financial management;Assist for transportation;Help with stairs or ramp for entrance    Functional Status Assessment   Patient has had a recent decline in their functional status and demonstrates the ability to make significant improvements in function in a reasonable and predictable amount of time.  Equipment Recommendations  None recommended by OT    Recommendations for Other Services       Precautions / Restrictions Precautions Precautions: Fall Restrictions Weight Bearing Restrictions: No      Mobility Bed Mobility Overal bed mobility: Needs Assistance Bed Mobility: Supine to Sit, Sit to Supine     Supine to sit: HOB elevated, Min assist Sit to supine: Min assist   General bed mobility comments: increased time    Transfers Overall transfer level: Needs assistance Equipment used: Rolling walker (2 wheels) Transfers: Sit to/from Stand, Bed to chair/wheelchair/BSC Sit to Stand: Min guard, Min assist           General transfer comment: MIN A, progressing to CGA, requires increased time, cues for RW use      Balance     Sitting balance-Leahy Scale: Fair Sitting balance - Comments: occasional L lateral lean, but able to correct with cues to engage core   Standing balance support: Bilateral upper extremity supported, Single extremity supported Standing balance-Leahy Scale: Fair Standing balance comment: able to alterante hands from walker while static standing for ADLs.                           ADL either performed or assessed with clinical judgement   ADL Overall ADL's : Needs assistance/impaired  General ADL Comments: SETUP for seated g/h, MIN A for seated UB bathing/dressing, MOD A for seated LB dressing-donning socks, MOD A for standing LB bathing with RW for UE support.     Vision   Additional Comments: limited visual attn, reports keeping her eyes closed much of the time at [place]. Generally able to track appropriately when open     Perception     Praxis      Pertinent Vitals/Pain Pain  Assessment Pain Assessment: No/denies pain     Hand Dominance     Extremity/Trunk Assessment Upper Extremity Assessment Upper Extremity Assessment: Generalized weakness;Overall [facility] for tasks assessed   Lower Extremity Assessment Lower Extremity Assessment: Generalized weakness       Communication Communication Communication: No difficulties   Cognition Arousal/Alertness: Awake/alert Behavior During Therapy: WFL for tasks assessed/performed Overall Cognitive Status: Within Functional Limits for tasks assessed                                 General Comments: Keeps eyes closed intermittently when asking subjective questions and during mobility, slow speech pattern     General Comments       Exercises Other Exercises Other Exercises: role of OT   Shoulder Instructions      Home Living Family/patient expects to be discharged to:: Private residence Living Arrangements: Alone Available Help at Discharge: Personal care attendant Type of Home: House Home Access: Ramped entrance     Home Layout: One level     Bathroom Shower/Tub: Occupational psychologist: Handicapped height Bathroom Accessibility: Yes   Home Equipment: Conservation officer, nature (2 wheels);Rollator (4 wheels);Shower seat;Wheelchair - Press photographer;Toilet riser   Additional Comments: grab bar on bed      Prior Functioning/Environment Prior Level of Function : Needs assist       Physical Assist : Mobility (physical);ADLs (physical) Mobility (physical): Gait ADLs (physical): Grooming;Bathing;Dressing;Toileting;IADLs Mobility Comments: Relies on grab bars and electric scooter for mobility. Reports ambulating very short distances. ADLs Comments: Caregivers assist in some dressing, toileting, transfers.        OT Problem List: Decreased strength;Decreased activity tolerance;Impaired balance (sitting and/or standing)      OT Treatment/Interventions: Self-care/ADL  training;Therapeutic exercise;Therapeutic activities    OT Goals(Current goals can be found in the care plan section) Acute Rehab OT Goals Patient Stated Goal: to get stronger OT Goal Formulation: With patient Time For Goal Achievement: 01/12/22 Potential to Achieve Goals: Good ADL Goals Pt Will Perform Lower Body Dressing: with min guard assist;sitting/lateral leans Pt Will Transfer to Toilet: with min guard assist;with supervision;ambulating;bedside commode (RW to BSC ~10' to increase tolerance for HH distance) Pt Will Perform Toileting - Clothing Manipulation and hygiene: with supervision;sitting/lateral leans  OT Frequency: Min 2X/week    Co-evaluation              AM-PAC OT "6 Clicks" Daily Activity     Outcome Measure Help from another person eating meals?: None Help from another person taking care of personal grooming?: A Little Help from another person toileting, which includes using toliet, bedpan, or urinal?: A Little Help from another person bathing (including washing, rinsing, drying)?: A Lot Help from another person to put on and taking off regular upper body clothing?: A Little Help from another person to put on and taking off regular lower body clothing?: A Lot 6 Click Score: 17   End of Session Equipment Utilized During Treatment: Rolling walker (  2 wheels) Nurse Communication: Mobility status  Activity Tolerance: Patient tolerated treatment well Patient left: in bed;with call bell/phone within reach;with family/visitor present;with bed alarm set  OT Visit Diagnosis: Unsteadiness on feet (R26.81)                Time: 0746-0029 OT Time Calculation (min): 46 min Charges:  OT General Charges $OT Visit: 1 Visit OT Evaluation $OT Eval Moderate Complexity: 1 Mod OT Treatments $Self Care/Home Management : 8-22 mins $Therapeutic Activity: 8-22 mins  Gerrianne Scale, MS, OTR/L ascom [phone] 12/30/21, 11:37 AM

## 2021-12-30 NOTE — Discharge Summary (Signed)
[facility] [place] NAME: Whitney [name]    MR#:  [id number]  DATE OF BIRTH:  1941/10/14  DATE OF ADMISSION:  12/26/2021 ADMITTING PHYSICIAN: Loletha Grayer, MD  DATE OF DISCHARGE: 12/30/2021  2:41 PM  PRIMARY CARE PHYSICIAN: Idelle Crouch, MD    ADMISSION DIAGNOSIS:  Atypical pneumonia [J18.9] Sepsis, due to unspecified organism, unspecified whether acute organ dysfunction present ([place]) [A41.9] Melena [K92.1]  DISCHARGE DIAGNOSIS:  Principal Problem:   Atypical pneumonia Active Problems:   Diabetes mellitus type 2 in obese ([place])   Overactive bladder   Parkinson's disease ([place])   Vitamin D deficiency   At high risk for falls   Impaired mobility and ADLs   Depression   Severe sepsis ([place])   Iron deficiency anemia   Hepatic cirrhosis ([place])   Slurred speech   Pyuria   Melena   Lactic acidosis   Drop in hemoglobin   Type 2 diabetes mellitus without complication, without long-term current use of insulin (HCC)   Secondary esophageal varices without bleeding (HCC)   Weakness   Acute cystitis with hematuria   Epigastric pain   SECONDARY DIAGNOSIS:   Past Medical History:  Diagnosis Date   Anemia    Arthritis    Broken shoulder    left humerus    COVID-19    Depression    Diabetes mellitus without complication (HCC)    Goiter    Hyperlipidemia    Overactive bladder    Parkinson's disease (HCC)    Pneumonia    Right arm weakness    limited movement   Unsteady gait     HOSPITAL COURSE:   1.  Severe sepsis, present on admission with atypical pneumonia, elevated lactic acid of 5.6, tachycardia and tachypnea.  Patient also had acute cystitis with hematuria and urine culture grew out Enterobacter which was resistant to the Rocephin which was initially prescribed.  Patient initially on Rocephin and Zithromax and changed over to Levaquin for total of 3 days requiring 1 more day of Levaquin upon  discharge. 2.  Melena with drop in hemoglobin.  CT scan consistent with cirrhosis of the liver.  Dr. Alice Reichert did an upper endoscopy on 12/27/2021 which showed esophageal varices, portal hypertensive gastropathy and gastritis.  The patient had 2 days of octreotide drip and was on IV Protonix initially.  We will switch to Protonix daily. 3.  Pancytopenia likely secondary to cirrhosis.  The patient declined a blood transfusion during hospital course.  Hemoglobin 7.5 upon disposition.  Platelet count 90,000 upon disposition.  White blood cell count 3.1 upon disposition.  I will refer to hematology as outpatient for closer monitoring of blood counts. 4.  Liver cirrhosis without ascites.  Hepatitis profiles earlier in the year negative.  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  Blood pressure too low to start nadolol at this time.  Can consider starting nadolol as outpatient.  Ammonia level slightly high at 54.  We will give very low-dose lactulose once a day with goal of 3 bowel movements per day.  Can titrate as outpatient.  Follow-up with gastroenterology as outpatient 5.  Type 2 diabetes mellitus without complication.  We did do sliding scale while here in the hospital can go back on Glucophage as outpatient. 6.  Weakness.  Physical therapy recommending home health 7.  Depression on Wellbutrin 8.  Parkinson's disease on amantadine 9.  Obesity with a BMI of 32.26 10.  Patient initially had some slurred  speech.  MRI of the brain was negative for acute abnormality.  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  DISCHARGE CONDITIONS:   Satisfactory  CONSULTS OBTAINED:  Treatment Team:  Efrain Sella, MD  DRUG ALLERGIES:  No Known Allergies  DISCHARGE MEDICATIONS:   Allergies as of 12/30/2021   No Known Allergies      Medication List     STOP taking these medications    meloxicam  7.5 MG tablet Commonly known as: MOBIC   rOPINIRole 0.25 MG tablet Commonly known as: REQUIP       TAKE these medications    Accu-Chek Guide Me w/Device Kit   Accu-Chek Guide test strip Generic drug: glucose blood   Accu-Chek Softclix Lancets lancets   acetaminophen 500 MG tablet Commonly known as: TYLENOL Take 500-1,000 mg by mouth every 6 (six) hours as needed (for pain.).   amantadine 100 MG capsule Commonly known as: SYMMETREL Take 100 mg by mouth 2 (two) times daily.   B-D SINGLE USE SWABS REGULAR Pads   buPROPion 150 MG 24 hr tablet Commonly known as: WELLBUTRIN XL TAKE 1 TABLET(150 MG) BY MOUTH DAILY   cholecalciferol 25 MCG (1000 UNIT) tablet Commonly known as: VITAMIN D3 Take 1,000 Units by mouth daily.   ferrous sulfate 325 (65 FE) MG tablet Take 325 mg by mouth daily with breakfast.   lactulose 10 GM/15ML solution Commonly known as: CHRONULAC Take 15 mLs (10 g total) by mouth daily. Start taking on: December 31, 2021   levofloxacin 500 MG tablet Commonly known as: LEVAQUIN Take 1 tablet (500 mg total) by mouth daily for 1 day. Start taking on: December 31, 2021   metFORMIN 500 MG 24 hr tablet Commonly known as: GLUCOPHAGE-XR Take 1 tablet (500 mg total) by mouth 2 (two) times daily with a meal. TAKE 1 TABLET(500 MG) BY MOUTH BID What changed: when to take this   Myrbetriq 50 MG Tb24 tablet Generic drug: mirabegron ER TAKE 1 TABLET(50 MG) BY MOUTH DAILY   pantoprazole 40 MG tablet Commonly known as: PROTONIX Take 1 tablet (40 mg total) by mouth daily. Start taking on: December 31, 2021   Trospium Chloride 60 MG Cp24 TAKE 1 CAPSULE(60 MG) BY MOUTH DAILY   vitamin B-12 500 MCG tablet Commonly known as: CYANOCOBALAMIN Take 500 mcg by mouth daily.         DISCHARGE INSTRUCTIONS:   Follow-up PMD 5 days Follow-up gastroenterology 3 weeks Follow-up hematology oncology as outpatient  If you experience worsening of your admission  symptoms, develop shortness of breath, life threatening emergency, suicidal or homicidal thoughts you must seek medical attention immediately by calling 911 or calling your MD immediately  if symptoms less severe.  You Must read complete instructions/literature along with all the possible adverse reactions/side effects for all the Medicines you take and that have been prescribed to you. Take any new Medicines after you have completely understood and accept all the possible adverse reactions/side effects.   Please note  You were cared for by a hospitalist during your hospital stay. If you have any questions about your discharge medications or the care you received while you were in the hospital after you are discharged, you can call the unit and asked to speak with the hospitalist on call if the hospitalist that took care of you is not available. Once you are discharged, your primary care physician will handle any further medical issues.  Please note that NO REFILLS for any discharge medications will be authorized once you are discharged, as it is imperative that you return to your primary care physician (or establish a relationship with a primary care physician if you do not have one) for your aftercare needs so that they can reassess your need for medications and monitor your lab values.    Today   CHIEF COMPLAINT:   Chief Complaint  Patient presents with   Chest Pain    Pt from home and had episode of central chest pain while ambulating with home health aide. Pain does not radiate and pt reports it subsided prior to arrival. Pt endorses SOB but states it is no worse than normal for her. Pt denies cardiac history. Denies numbness/tingling. Pt had 324 ASA en route.     HISTORY OF PRESENT ILLNESS:  Whitney [name]  is a 81 y.o. female initially came in with chest pain and diagnosed with pneumonia   VITAL SIGNS:  Blood pressure 111/63, pulse 83, temperature 98.1 F (36.7 C), temperature source  Oral, resp. rate 16, height 5' 2" (1.575 m), weight 80 kg, SpO2 96 %.  I/O:   Intake/Output Summary (Last 24 hours) at 12/30/2021 1531 Last data filed at 12/30/2021 1207 Gross per 24 hour  Intake 120 ml  Output 1200 ml  Net -1080 ml    PHYSICAL EXAMINATION:  GENERAL:  81 y.o.-year-old patient lying in the bed with no acute distress.  EYES: Pupils equal, round, reactive to light and accommodation. No scleral icterus.  HEENT: Head atraumatic, normocephalic. Oropharynx and nasopharynx clear.  LUNGS: Normal breath sounds bilaterally, no wheezing, rales,rhonchi or crepitation. No use of accessory muscles of respiration.  CARDIOVASCULAR: S1, S2 normal. No murmurs, rubs, or gallops.  ABDOMEN: Soft, non-tender, non-distended.  EXTREMITIES: No pedal edema.  NEUROLOGIC: Cranial nerves II through XII are intact. Muscle strength 5/5 in all extremities. Sensation intact. Gait not checked.  PSYCHIATRIC: The patient is alert and answers all questions appropriately.  SKIN: No obvious rash, lesion, or ulcer.   DATA REVIEW:   CBC Recent Labs  Lab 12/30/21 0631  WBC 3.1*  HGB 7.5*  HCT 23.3*  PLT 90*    Chemistries  Recent Labs  Lab 12/29/21 0707 12/30/21 0631  NA 140  --   K 3.5  --   CL 110  --   CO2 25  --   GLUCOSE 146*  --   BUN 9  --   CREATININE 0.50  --   CALCIUM 7.7*  --   AST  --  48*  ALT  --  29  ALKPHOS  --  68  BILITOT  --  0.8     Microbiology Results  Results for orders placed or performed during the hospital encounter of 12/26/21  Resp Panel by RT-PCR (Flu A&B, Covid) Nasopharyngeal Swab     Status: None   Collection Time: 12/26/21  3:29 PM   Specimen: Nasopharyngeal Swab; Nasopharyngeal(NP) swabs in vial transport medium  Result Value Ref Range Status   SARS Coronavirus 2 by RT PCR NEGATIVE NEGATIVE Final    Comment: (NOTE) SARS-CoV-2 target nucleic acids are NOT DETECTED.  The SARS-CoV-2 RNA is generally detectable in upper respiratory specimens during  the acute phase of infection. The lowest concentration of SARS-CoV-2 viral copies this assay can detect is 138 copies/mL. A negative result does not preclude SARS-Cov-2 infection and should not be used as the sole basis for treatment or other patient management decisions. A negative  result may occur with  improper specimen collection/handling, submission of specimen other than nasopharyngeal swab, presence of viral mutation(s) within the areas targeted by this assay, and inadequate number of viral copies(<138 copies/mL). A negative result must be combined with clinical observations, patient history, and epidemiological information. The expected result is Negative.  Fact Sheet for Patients:  EntrepreneurPulse.com.au  Fact Sheet for Healthcare Providers:  IncredibleEmployment.be  This test is no t yet approved or cleared by the [place] FDA and  has been authorized for detection and/or diagnosis of SARS-CoV-2 by FDA under an Emergency Use Authorization (EUA). This EUA will remain  in effect (meaning this test can be used) for the duration of the COVID-19 declaration under Section 564(b)(1) of the Act, 21 U.S.C.section 360bbb-3(b)(1), unless the authorization is terminated  or revoked sooner.       Influenza A by PCR NEGATIVE NEGATIVE Final   Influenza B by PCR NEGATIVE NEGATIVE Final    Comment: (NOTE) The Xpert Xpress SARS-CoV-2/FLU/RSV plus assay is intended as an aid in the diagnosis of influenza from Nasopharyngeal swab specimens and should not be used as a sole basis for treatment. Nasal washings and aspirates are unacceptable for Xpert Xpress SARS-CoV-2/FLU/RSV testing.  Fact Sheet for Patients: EntrepreneurPulse.com.au  Fact Sheet for Healthcare Providers: IncredibleEmployment.be  This test is not yet approved or cleared by the [place] FDA and has been authorized for detection and/or  diagnosis of SARS-CoV-2 by FDA under an Emergency Use Authorization (EUA). This EUA will remain in effect (meaning this test can be used) for the duration of the COVID-19 declaration under Section 564(b)(1) of the Act, 21 U.S.C. section 360bbb-3(b)(1), unless the authorization is terminated or revoked.  Performed at [facility], [place], [place] [address]   Respiratory (~20 pathogens) panel by PCR     Status: None   Collection Time: 12/26/21  3:29 PM   Specimen: Nasopharyngeal Swab; Respiratory  Result Value Ref Range Status   Adenovirus NOT DETECTED NOT DETECTED Final   Coronavirus 229E NOT DETECTED NOT DETECTED Final    Comment: (NOTE) The Coronavirus on the Respiratory Panel, DOES NOT test for the novel  Coronavirus (2019 nCoV)    Coronavirus HKU1 NOT DETECTED NOT DETECTED Final   Coronavirus NL63 NOT DETECTED NOT DETECTED Final   Coronavirus OC43 NOT DETECTED NOT DETECTED Final   Metapneumovirus NOT DETECTED NOT DETECTED Final   Rhinovirus / Enterovirus NOT DETECTED NOT DETECTED Final   Influenza A NOT DETECTED NOT DETECTED Final   Influenza B NOT DETECTED NOT DETECTED Final   Parainfluenza Virus 1 NOT DETECTED NOT DETECTED Final   Parainfluenza Virus 2 NOT DETECTED NOT DETECTED Final   Parainfluenza Virus 3 NOT DETECTED NOT DETECTED Final   Parainfluenza Virus 4 NOT DETECTED NOT DETECTED Final   Respiratory Syncytial Virus NOT DETECTED NOT DETECTED Final   Bordetella pertussis NOT DETECTED NOT DETECTED Final   Bordetella Parapertussis NOT DETECTED NOT DETECTED Final   Chlamydophila pneumoniae NOT DETECTED NOT DETECTED Final   Mycoplasma pneumoniae NOT DETECTED NOT DETECTED Final    Comment: Performed at [facility], [place]. [address], [place], [place] [address]  Urine Culture     Status: Abnormal   Collection Time: 12/26/21  7:50 PM   Specimen: Urine, Random  Result Value Ref Range Status   Specimen Description   Final    URINE,  RANDOM Performed at [facility], [address] [place], [place] [address]    Special Requests  Final    NONE Performed at [facility], [address] [place], [place] [address]    Culture >=100,000 COLONIES/mL ENTEROBACTER AEROGENES (A)  Final   Report Status 12/29/2021 FINAL  Final   Organism ID, Bacteria ENTEROBACTER AEROGENES (A)  Final      Susceptibility   Enterobacter aerogenes - MIC*    CEFAZOLIN >=64 RESISTANT Resistant     CEFEPIME <=0.12 SENSITIVE Sensitive     CEFTRIAXONE 32 RESISTANT Resistant     CIPROFLOXACIN <=0.25 SENSITIVE Sensitive     GENTAMICIN <=1 SENSITIVE Sensitive     IMIPENEM 0.5 SENSITIVE Sensitive     NITROFURANTOIN 64 INTERMEDIATE Intermediate     TRIMETH/SULFA <=20 SENSITIVE Sensitive     PIP/TAZO >=128 RESISTANT Resistant     * >=100,000 COLONIES/mL ENTEROBACTER AEROGENES  Blood culture (routine x 2)     Status: None (Preliminary result)   Collection Time: 12/26/21  7:59 PM   Specimen: BLOOD  Result Value Ref Range Status   Specimen Description BLOOD BLOOD RIGHT FOREARM  Final   Special Requests   Final    BOTTLES DRAWN AEROBIC AND ANAEROBIC Blood Culture results may not be optimal due to an excessive volume of blood received in culture bottles   Culture   Final    NO GROWTH 4 DAYS Performed at [facility], [address], [place], [place] [address]    Report Status PENDING  Incomplete  Blood culture (routine x 2)     Status: None (Preliminary result)   Collection Time: 12/26/21  7:59 PM   Specimen: BLOOD  Result Value Ref Range Status   Specimen Description BLOOD LEFT ANTECUBITAL  Final   Special Requests   Final    BOTTLES DRAWN AEROBIC AND ANAEROBIC Blood Culture results may not be optimal due to an excessive volume of blood received in culture bottles   Culture   Final    NO GROWTH 4 DAYS Performed at [facility], [address], [place], [place] [address]    Report Status PENDING   Incomplete      Management plans discussed with the patient, family and they are in agreement.  CODE STATUS:     Code Status Orders  (From admission, onward)           Start     Ordered   12/26/21 2011  Full code  Continuous        12/26/21 2012           Code Status History     Date Active Date Inactive Code Status Order ID Comments User Context   08/03/2020 1142 08/04/2020 1958 Full Code [id number]  Clyde Canterbury, MD Inpatient       TOTAL TIME TAKING CARE OF THIS PATIENT: 34 minutes.    Loletha Grayer M.D on 12/30/2021 at 3:31 PM    [facility]  CC: Primary care physician; Idelle Crouch, MD

## 2021-12-30 NOTE — Plan of Care (Signed)
Patient is alert and oriented. Vital signs are stable. Will follow with Hematology for esophageal varices. Patient and Family education provided

## 2021-12-30 NOTE — Progress Notes (Signed)
Occupational Therapy Treatment Patient Details Name: Whitney [name] MRN: [id number] DOB: [birth year] Today's Date: 12/30/2021   History of present illness Per ED physician: Whitney [name] is a 81 y.o. female with diabetes presents to the ER for evaluation of this chest pain states that she feels pressure tightness in her chest that started while she was walking at home. Feels like it has eased off but still having discomfort. PMH includes Parkinson Disease. Adm for Sepsis.   OT comments  Whitney [name] presents today with generalized weakness, limited endurance, impaired balance. Pt received in bed, attempting to eat and take medications but is experiencing a difficult time swallowing. All of her motions are exceedingly slow and labored, but she expresses determination to complete them herself. She requires Min-Mod A for bed mobility and transfers, demonstrates fair standing balance, which she is able to maintain even w/out UE support on RW. Provided educ re: falls prevention, home safety, ECS. Pt and daughter verbalize understanding. Pt has aide at home for most of day. Recommend adding HHOT to work on improving strength, balance, endurance, to increase safety at home and reduce risk of rehospitalization.     Recommendations for follow up therapy are one component of a multi-disciplinary discharge planning process, led by the attending physician.  Recommendations may be updated based on patient status, additional functional criteria and insurance authorization.    Follow Up Recommendations  Home health OT    Assistance Recommended at Discharge Frequent or constant Supervision/Assistance  Patient can return home with the following  A little help with bathing/dressing/bathroom;Assistance with cooking/housework;Direct supervision/assist for medications management;Assist for transportation   Equipment Recommendations  None recommended by OT    Recommendations for Other Services      Precautions /  Restrictions Precautions Precautions: Fall Restrictions Weight Bearing Restrictions: No       Mobility Bed Mobility Overal bed mobility: Needs Assistance Bed Mobility: Supine to Sit, Sit to Supine     Supine to sit: HOB elevated, Min assist Sit to supine: Mod assist   General bed mobility comments: increased time. Min A for scooting towards HOB in supine. Heavy reliance on bed rails    Transfers Overall transfer level: Needs assistance Equipment used: Rolling walker (2 wheels) Transfers: Sit to/from Stand Sit to Stand: Min guard           General transfer comment: Cueing for safe use of RW. Pt normally transfers directly from bed to scooter     Balance Overall balance assessment: Needs assistance Sitting-balance support: Bilateral upper extremity supported, Feet supported Sitting balance-Leahy Scale: Good Sitting balance - Comments: slight forward flexion in sitting   Standing balance support: Bilateral upper extremity supported, During functional activity Standing balance-Leahy Scale: Fair Standing balance comment: able to reach w/in BOS                           ADL either performed or assessed with clinical judgement   ADL Overall ADL's : Needs assistance/impaired Eating/Feeding: Minimal assistance Eating/Feeding Details (indicate cue type and reason): requires greatly increased time, cueing for swallowing                                 Functional mobility during ADLs: Minimal assistance;Moderate assistance General ADL Comments: Min-Mod A for fxl mobility    Extremity/Trunk Assessment Upper Extremity Assessment Upper Extremity Assessment: Generalized weakness   Lower Extremity Assessment Lower Extremity Assessment:  Generalized weakness   Cervical / Trunk Assessment Cervical / Trunk Assessment: Normal    Vision Patient Visual Report: No change from baseline Additional Comments: Denies all visual impairment; keeps eyes closed  during much of session, requires cueing to open eyes while standing, ambulating.   Perception     Praxis      Cognition Arousal/Alertness: Awake/alert Behavior During Therapy: WFL for tasks assessed/performed                                   General Comments: A&O x 4. Requires increased time, effort to respond.        Exercises Other Exercises Other Exercises: Educ re: role of OT, falls prevention, ECS, home safety, DC recs    Shoulder Instructions       General Comments      Pertinent Vitals/ Pain       Pain Assessment Pain Assessment: No/denies pain  Home Living Family/patient expects to be discharged to:: Private residence Living Arrangements: Alone Available Help at Discharge: Personal care attendant Type of Home: House Home Access: Ramped entrance     Home Layout: One level     Bathroom Shower/Tub: Producer, television/film/video: Handicapped height Bathroom Accessibility: Yes   Home Equipment: Agricultural consultant (2 wheels);Rollator (4 wheels);Shower seat;Wheelchair - IT trainer;Toilet riser   Additional Comments: grab bar on bed      Prior Functioning/Environment              Frequency  Min 2X/week        Progress Toward Goals  OT Goals(current goals can now be found in the care plan section)  Progress towards OT goals: Progressing toward goals  Acute Rehab OT Goals Patient Stated Goal: to go home OT Goal Formulation: With patient Time For Goal Achievement: 01/12/22 Potential to Achieve Goals: Good ADL Goals Pt Will Perform Lower Body Dressing: with min guard assist;sitting/lateral leans Pt Will Transfer to Toilet: with min guard assist;with supervision;ambulating;bedside commode (RW to BSC ~10' to increase tolerance for HH distance) Pt Will Perform Toileting - Clothing Manipulation and hygiene: with supervision;sitting/lateral leans  Plan Discharge plan remains appropriate;Frequency remains appropriate     Co-evaluation                 AM-PAC OT "6 Clicks" Daily Activity     Outcome Measure   Help from another person eating meals?: A Little Help from another person taking care of personal grooming?: A Little Help from another person toileting, which includes using toliet, bedpan, or urinal?: A Little Help from another person bathing (including washing, rinsing, drying)?: A Lot Help from another person to put on and taking off regular upper body clothing?: A Little Help from another person to put on and taking off regular lower body clothing?: A Lot 6 Click Score: 16    End of Session Equipment Utilized During Treatment: Rolling walker (2 wheels)  OT Visit Diagnosis: Unsteadiness on feet (R26.81);Muscle weakness (generalized) (M62.81)   Activity Tolerance Patient tolerated treatment well   Patient Left in bed;with call bell/phone within reach;with family/visitor present;with bed alarm set   Nurse Communication Mobility status        Time: 3295-1884 OT Time Calculation (min): 29 min  Charges: OT General Charges $OT Visit: 1 Visit OT Evaluation $OT Eval Moderate Complexity: 1 Mod OT Treatments $Self Care/Home Management : 23-37 mins $Therapeutic Activity: 8-22 mins  Latina Craver, PhD,  MS, OTR/L 12/30/21, 12:38 PM

## 2021-12-31 LAB — CULTURE, BLOOD (ROUTINE X 2)
Culture: NO GROWTH
Culture: NO GROWTH

## 2022-01-02 ENCOUNTER — Other Ambulatory Visit: Payer: Self-pay

## 2022-01-02 DIAGNOSIS — D509 Iron deficiency anemia, unspecified: Secondary | ICD-10-CM

## 2022-01-03 ENCOUNTER — Other Ambulatory Visit: Payer: Self-pay | Admitting: *Deleted

## 2022-01-03 DIAGNOSIS — D509 Iron deficiency anemia, unspecified: Secondary | ICD-10-CM

## 2022-01-10 ENCOUNTER — Inpatient Hospital Stay: Payer: Medicare PPO | Attending: Oncology

## 2022-01-10 ENCOUNTER — Other Ambulatory Visit: Payer: Self-pay

## 2022-01-10 DIAGNOSIS — Z8616 Personal history of COVID-19: Secondary | ICD-10-CM | POA: Diagnosis not present

## 2022-01-10 DIAGNOSIS — K7460 Unspecified cirrhosis of liver: Secondary | ICD-10-CM | POA: Diagnosis not present

## 2022-01-10 DIAGNOSIS — D509 Iron deficiency anemia, unspecified: Secondary | ICD-10-CM | POA: Insufficient documentation

## 2022-01-10 DIAGNOSIS — D649 Anemia, unspecified: Secondary | ICD-10-CM | POA: Diagnosis not present

## 2022-01-10 DIAGNOSIS — Z87891 Personal history of nicotine dependence: Secondary | ICD-10-CM | POA: Insufficient documentation

## 2022-01-10 DIAGNOSIS — G20 Parkinson's disease: Secondary | ICD-10-CM | POA: Diagnosis not present

## 2022-01-10 DIAGNOSIS — Z801 Family history of malignant neoplasm of trachea, bronchus and lung: Secondary | ICD-10-CM | POA: Diagnosis not present

## 2022-01-10 DIAGNOSIS — R161 Splenomegaly, not elsewhere classified: Secondary | ICD-10-CM | POA: Insufficient documentation

## 2022-01-10 DIAGNOSIS — Z79899 Other long term (current) drug therapy: Secondary | ICD-10-CM | POA: Insufficient documentation

## 2022-01-10 DIAGNOSIS — D696 Thrombocytopenia, unspecified: Secondary | ICD-10-CM | POA: Diagnosis not present

## 2022-01-10 DIAGNOSIS — E785 Hyperlipidemia, unspecified: Secondary | ICD-10-CM | POA: Diagnosis not present

## 2022-01-10 DIAGNOSIS — R5383 Other fatigue: Secondary | ICD-10-CM | POA: Insufficient documentation

## 2022-01-10 DIAGNOSIS — F329 Major depressive disorder, single episode, unspecified: Secondary | ICD-10-CM | POA: Diagnosis not present

## 2022-01-10 DIAGNOSIS — Z800 Family history of malignant neoplasm of digestive organs: Secondary | ICD-10-CM | POA: Insufficient documentation

## 2022-01-10 DIAGNOSIS — E119 Type 2 diabetes mellitus without complications: Secondary | ICD-10-CM | POA: Diagnosis not present

## 2022-01-10 DIAGNOSIS — Z808 Family history of malignant neoplasm of other organs or systems: Secondary | ICD-10-CM | POA: Insufficient documentation

## 2022-01-10 DIAGNOSIS — I700 Atherosclerosis of aorta: Secondary | ICD-10-CM | POA: Insufficient documentation

## 2022-01-10 LAB — CBC WITH DIFFERENTIAL/PLATELET
Abs Immature Granulocytes: 0.01 10*3/uL (ref 0.00–0.07)
Basophils Absolute: 0 10*3/uL (ref 0.0–0.1)
Basophils Relative: 1 %
Eosinophils Absolute: 0.1 10*3/uL (ref 0.0–0.5)
Eosinophils Relative: 2 %
HCT: 27 % — ABNORMAL LOW (ref 36.0–46.0)
Hemoglobin: 8.4 g/dL — ABNORMAL LOW (ref 12.0–15.0)
Immature Granulocytes: 0 %
Lymphocytes Relative: 18 %
Lymphs Abs: 0.7 10*3/uL (ref 0.7–4.0)
MCH: 27.5 pg (ref 26.0–34.0)
MCHC: 31.1 g/dL (ref 30.0–36.0)
MCV: 88.5 fL (ref 80.0–100.0)
Monocytes Absolute: 0.4 10*3/uL (ref 0.1–1.0)
Monocytes Relative: 10 %
Neutro Abs: 2.6 10*3/uL (ref 1.7–7.7)
Neutrophils Relative %: 69 %
Platelets: 149 10*3/uL — ABNORMAL LOW (ref 150–400)
RBC: 3.05 MIL/uL — ABNORMAL LOW (ref 3.87–5.11)
RDW: 16.8 % — ABNORMAL HIGH (ref 11.5–15.5)
WBC: 3.8 10*3/uL — ABNORMAL LOW (ref 4.0–10.5)
nRBC: 0 % (ref 0.0–0.2)

## 2022-01-10 LAB — IRON AND TIBC
Iron: 32 ug/dL (ref 28–170)
Saturation Ratios: 8 % — ABNORMAL LOW (ref 10.4–31.8)
TIBC: 386 ug/dL (ref 250–450)
UIBC: 354 ug/dL

## 2022-01-10 LAB — SAMPLE TO BLOOD BANK

## 2022-01-10 LAB — FERRITIN: Ferritin: 12 ng/mL (ref 11–307)

## 2022-01-11 ENCOUNTER — Encounter: Payer: Self-pay | Admitting: Oncology

## 2022-01-11 ENCOUNTER — Inpatient Hospital Stay (HOSPITAL_BASED_OUTPATIENT_CLINIC_OR_DEPARTMENT_OTHER): Payer: Medicare PPO | Admitting: Oncology

## 2022-01-11 VITALS — BP 130/80 | HR 102 | Temp 97.3°F | Wt 178.0 lb

## 2022-01-11 DIAGNOSIS — D500 Iron deficiency anemia secondary to blood loss (chronic): Secondary | ICD-10-CM

## 2022-01-11 DIAGNOSIS — D509 Iron deficiency anemia, unspecified: Secondary | ICD-10-CM | POA: Insufficient documentation

## 2022-01-11 DIAGNOSIS — K7460 Unspecified cirrhosis of liver: Secondary | ICD-10-CM

## 2022-01-11 DIAGNOSIS — D696 Thrombocytopenia, unspecified: Secondary | ICD-10-CM | POA: Diagnosis not present

## 2022-01-11 DIAGNOSIS — R161 Splenomegaly, not elsewhere classified: Secondary | ICD-10-CM

## 2022-01-11 HISTORY — DX: Iron deficiency anemia, unspecified: D50.9

## 2022-01-11 NOTE — Progress Notes (Signed)
Hematology/Oncology Progress note [phone] [phone]      Patient Care Team: Idelle Crouch, MD as PCP - General (Internal Medicine) Vladimir Crofts, MD as Consulting Physician (Neurology) Clyde Canterbury, MD as Referring Physician (Otolaryngology) Festus Aloe, MD as Consulting Physician (Urology) Earlie Server, MD as Consulting Physician (Hematology and Oncology)  REFERRING PROVIDER: Idelle Crouch, MD CHIEF COMPLAINTS/REASON FOR VISIT:  Follow-up for iron deficiency anemia  HISTORY OF PRESENTING ILLNESS:  Whitney [name] is a  81 y.o.  female with PMH listed below was seen in consultation at the request of Idelle Crouch, MD   for evaluation of iron deficiency anemia.   Reviewed patient's recent labs  10/5/2021Labs revealed anemia with hemoglobin of 9.9 Iron panel ferritin 8, iron saturation 6, TIBC 4 7. Reviewed patient's previous labs ordered by other physician, anemia is new onset since October 2021. No aggravating or improving factors.  Associated signs and symptoms: Patient reports fatigue.  SOB with exertion.  Denies weight loss, easy bruising, hematochezia, hemoptysis, hematuria. Context:  History of iron deficiency: Denies any previous history of iron deficiency.  She has been started on ferrous sulfate 325 mg daily.  She reports tolerating iron treatments very well. Rectal bleeding: She denies any bleeding she recalls that her stool has been dark even before iron supplementation Menstrual bleeding/ Vaginal bleeding : Denies Hematemesis or hemoptysis : denies Blood in urine : denies   Family history positive for sister Izora Gala with colon cancer, another sister with kidney cancer and a daughter with kidney cancer.    06/29/2020, CT chest lung cancer screening showed lung RADS 2 Irregular liver contour consistent with cirrhosis.  Splenomegaly Markedly enlarged and a low-attenuation left thyroid.  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  Post surgery TSH was normal at 3 on 09/01/2020. Patient has increased bilateral lower extremity swelling and has been to emergency room for concern of myxedema.  TSH and T4 have been normal.   INTERVAL HISTORY Whitney [name] is a 81 y.o. female who has above history reviewed by me today presents for follow up visit for management of iron deficiency anemia Patient has lost follow-up since January 2023.  She presents to reestablish care.  Was accompanied by her daughter. Patient reports feeling tired and fatigued. 12/26/2021 - 12/30/2021 patient was hospitalized due to atypical pneumonia, sepsis, GI bleeding.  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 12/27/2021, EGD showed esophageal varices, portal hypertensive gastropathy and gastritis.  Patient was treated with octreotide drip and IV Protonix.  Her hemoglobin was low during hospitalization and the patient declined blood transfusion.  Hemoglobin was 7.5 at discharge.  Platelet count 90,000.  Patient was referred to reestablish care with hematology for further evaluation management. Patient denies seeing any blood in the stool since discharge.  No nausea vomiting diarrhea..   Review of Systems  Constitutional:  Positive for fatigue. Negative for appetite change, chills and fever.  HENT:   Negative for hearing loss and voice change.   Eyes:  Negative for eye problems.  Respiratory:  Negative for chest tightness and cough.   Cardiovascular:  Positive for leg swelling. Negative for chest pain.  Gastrointestinal:  Negative for abdominal distention, abdominal pain and blood in stool.  Endocrine: Negative for hot flashes.  Genitourinary:  Negative for difficulty urinating and frequency.   Musculoskeletal:  Negative for arthralgias.  Skin:  Negative for itching  and rash.   Neurological:  Negative for extremity weakness.  Hematological:  Negative for adenopathy. Bruises/bleeds easily.  Psychiatric/Behavioral:  Negative for confusion.    MEDICAL HISTORY:  Past Medical History:  Diagnosis Date   Anemia    Arthritis    Broken shoulder    left humerus    COVID-19    Depression    Diabetes mellitus without complication ([place])    Goiter    Hyperlipidemia    Overactive bladder    Parkinson's disease ([place])    Pneumonia    Right arm weakness    limited movement   Unsteady gait     SURGICAL HISTORY: Past Surgical History:  Procedure Laterality Date   [employer]   post hysterectomy   ESOPHAGOGASTRODUODENOSCOPY N/A 12/27/2021   Procedure: ESOPHAGOGASTRODUODENOSCOPY (EGD);  Surgeon: Toledo, Benay Pike, MD;  Location: [facility] ENDOSCOPY;  Service: Gastroenterology;  Laterality: N/A;   NECK SURGERY     anterior cervical disc fusion   THYROIDECTOMY Left 08/03/2020   Procedure: THYROIDECTOMY-HEMI;  Surgeon: Clyde Canterbury, MD;  Location: [facility] ORS;  Service: ENT;  Laterality: Left;    SOCIAL HISTORY: Social History   Socioeconomic History   Marital status: Widowed    Spouse name: Jeneen Rinks   Number of children: 2   Years of education: 12   Highest education level: Some college, no degree  Occupational History   Occupation: retired school system  Tobacco Use   Smoking status: Former    Packs/day: 2.00    Years: 40.00    Pack years: 80.00    Types: Cigarettes    Quit date: 12/10/2018    Years since quitting: 3.0   Smokeless tobacco: Never  Vaping Use   Vaping Use: Never used  Substance and Sexual Activity   Alcohol use: Not Currently   Drug use: Never   Sexual activity: Not Currently  Other Topics Concern   Not on file  Social History Narrative   Lost her husband of 57 years in oct 2019. Moved back from [place] in Nov 2019   Social Determinants of Health   Financial Resource Strain: Not on file  Food  Insecurity: Not on file  Transportation Needs: Not on file  Physical Activity: Not on file  Stress: Not on file  Social Connections: Not on file  Intimate Partner Violence: Not on file    FAMILY HISTORY: Family History  Problem Relation Age of Onset   Stroke Mother    Hypertension Mother    Diabetes Father    Heart disease Father    Lung cancer Father    Colon cancer Sister    Diabetes Sister    Kidney disease Daughter    Kidney cancer Daughter    Pulmonary embolism Sister    Diabetes Sister    Kidney disease Sister    Heart disease Sister    Atrial fibrillation Sister    Diabetes Brother    Heart disease Brother    AAA (abdominal aortic aneurysm) Brother    Cancer Brother        skin    ALLERGIES:  has No Known Allergies.  MEDICATIONS:  Current Outpatient Medications  Medication Sig Dispense Refill   ACCU-CHEK GUIDE test strip      Accu-Chek Softclix Lancets lancets      acetaminophen (TYLENOL) 500 MG tablet Take 500-1,000 mg by mouth every 6 (six) hours as needed (for pain.).     Alcohol Swabs (B-D SINGLE  USE SWABS REGULAR) PADS      amantadine (SYMMETREL) 100 MG capsule Take 100 mg by mouth 2 (two) times daily.     Blood Glucose Monitoring Suppl (ACCU-CHEK GUIDE ME) w/Device KIT      buPROPion (WELLBUTRIN XL) 150 MG 24 hr tablet TAKE 1 TABLET(150 MG) BY MOUTH DAILY 90 tablet 1   cholecalciferol (VITAMIN D3) 25 MCG (1000 UNIT) tablet Take 1,000 Units by mouth daily.      ferrous sulfate 325 (65 FE) MG tablet Take 325 mg by mouth daily with breakfast.      lactulose (CHRONULAC) 10 GM/15ML solution Take 15 mLs (10 g total) by mouth daily. 946 mL 0   metFORMIN (GLUCOPHAGE-XR) 500 MG 24 hr tablet Take 1 tablet (500 mg total) by mouth 2 (two) times daily with a meal. TAKE 1 TABLET(500 MG) BY MOUTH BID (Patient taking differently: Take 500 mg by mouth 2 (two) times daily. TAKE 1 TABLET(500 MG) BY MOUTH BID) 180 tablet 3   MYRBETRIQ 50 MG TB24 tablet TAKE 1 TABLET(50 MG) BY  MOUTH DAILY 30 tablet 11   pantoprazole (PROTONIX) 40 MG tablet Take 1 tablet (40 mg total) by mouth daily. 30 tablet 0   Trospium Chloride 60 MG CP24 TAKE 1 CAPSULE(60 MG) BY MOUTH DAILY 90 capsule 3   vitamin B-12 (CYANOCOBALAMIN) 500 MCG tablet Take 500 mcg by mouth daily.     No current facility-administered medications for this visit.     PHYSICAL EXAMINATION: ECOG PERFORMANCE STATUS: 2 - Symptomatic, <50% confined to bed Vitals:   01/11/22 1345  BP: 130/80  Pulse: (!) 102  Temp: (!) 97.3 F (36.3 C)   Filed Weights   01/11/22 1345  Weight: 178 lb (80.7 kg)    Physical Exam Constitutional:      General: She is not in acute distress.    Appearance: She is obese.     Comments: Patient sits in the wheelchair  HENT:     Head: Normocephalic and atraumatic.  Eyes:     General: No scleral icterus. Cardiovascular:     Rate and Rhythm: Normal rate and regular rhythm.     Heart sounds: Normal heart sounds.  Pulmonary:     Effort: Pulmonary effort is normal. No respiratory distress.     Breath sounds: No wheezing.  Abdominal:     General: Bowel sounds are normal. There is no distension.     Palpations: Abdomen is soft.  Musculoskeletal:        General: No deformity. Normal range of motion.     Cervical back: Normal range of motion and neck supple.     Right lower leg: Edema present.     Left lower leg: Edema present.  Skin:    General: Skin is warm and dry.     Findings: No erythema or rash.  Neurological:     Mental Status: She is alert. Mental status is at baseline.     Cranial Nerves: No cranial nerve deficit.     Coordination: Coordination normal.  Psychiatric:        Mood and Affect: Mood normal.          LABORATORY DATA:  I have reviewed the data as listed Lab Results  Component Value Date   WBC 3.8 (L) 01/10/2022   HGB 8.4 (L) 01/10/2022   HCT 27.0 (L) 01/10/2022   MCV 88.5 01/10/2022   PLT 149 (L) 01/10/2022   Recent Labs    12/26/21 1525  12/27/21 3151  12/28/21 0839 12/29/21 0707 12/30/21 0631  NA 138 138 141 140  --   K 4.5 4.5 3.7 3.5  --   CL 105 109 110 110  --   CO2 19* 21* 24 25  --   GLUCOSE 103* 154* 139* 146*  --   BUN 34* 35* 16 9  --   CREATININE 0.62 0.50 0.51 0.50  --   CALCIUM 9.5 8.1* 8.0* 7.7*  --   GFRNONAA >60 >60 >60 >60  --   PROT 6.5  --   --   --  5.2*  ALBUMIN 3.2*  --   --   --  2.6*  AST 45*  --   --   --  48*  ALT 29  --   --   --  29  ALKPHOS 91  --   --   --  68  BILITOT 1.3*  --   --   --  0.8  BILIDIR  --   --   --   --  0.2  IBILI  --   --   --   --  0.6    Iron/TIBC/Ferritin/ %Sat    Component Value Date/Time   IRON 32 01/10/2022 1124   TIBC 386 01/10/2022 1124   FERRITIN 12 01/10/2022 1124   IRONPCTSAT 8 (L) 01/10/2022 1124   IRONPCTSAT 6 (L) 09/13/2020 1023     RADIOGRAPHIC STUDIES: I have personally reviewed the radiological images as listed and agreed with the findings in the report. CT ABDOMEN PELVIS WO CONTRAST  Result Date: 12/26/2021 CLINICAL DATA:  Un witnessed fall, sepsis EXAM: CT ABDOMEN AND PELVIS WITHOUT CONTRAST TECHNIQUE: Multidetector CT imaging of the abdomen and pelvis was performed following the standard protocol without IV contrast. RADIATION DOSE REDUCTION: This exam was performed according to the departmental dose-optimization program which includes automated exposure control, adjustment of the mA and/or kV according to patient size and/or use of iterative reconstruction technique. COMPARISON:  12/26/2021, 10/18/2020, 06/29/2020 FINDINGS: Lower chest: Improved aeration at the lung bases since prior CT. There is an indeterminate 0.5 x 0.6 cm ground-glass nodule within the left lower lobe. Hepatobiliary: Diffuse nodularity of the liver capsule consistent with cirrhosis. No focal parenchymal abnormalities identified on this limited unenhanced exam. Gallbladder is unremarkable. Pancreas: Unremarkable unenhanced appearance. Spleen: Mild splenomegaly, measuring  13.2 cm in craniocaudal length. No focal abnormalities on this unenhanced exam. Adrenals/Urinary Tract: Excreted contrast from previous CT angiography procedure. No evidence of urinary tract calculi or obstruction. Small peripelvic cysts are seen within the kidneys, with a 2.2 cm simple appearing right renal cyst also noted. The adrenals are unremarkable. No filling defects within the bladder. Stomach/Bowel: No bowel obstruction or ileus. Diverticulosis of the sigmoid colon without acute diverticulitis. Mild wall thickening and pericolonic fat stranding involving the ascending colon, which could reflect mild colitis. Remaining portions of the bowel are unremarkable. Vascular/Lymphatic: Mild atherosclerosis throughout the aorta and its branches. There is recanalization of the umbilical vein. Small varices are seen along the lesser curvature of the stomach. Numerous subcentimeter lymph nodes are seen within the mesentery, likely reactive. No pathologic adenopathy within the abdomen or pelvis. Reproductive: Status post hysterectomy. No adnexal masses. Other: No free fluid or free intraperitoneal gas. No abdominal wall hernia. Musculoskeletal: No acute or destructive bony lesions. Reconstructed images demonstrate no additional findings. IMPRESSION: 1. Mild wall thickening and pericolonic fat stranding involving the ascending colon, consistent with inflammatory or infectious colitis. 2. Cirrhosis, with portal venous hypertension manifested by  splenomegaly, gastric varices, and recanalization of the umbilical vein. 3. 6 mm left ground-glass left lower lobe pulmonary nodule. Recommend a non-contrast Chest CT at 6-12 months to confirm persistence, then additional non-contrast Chest CTs every 2 years until 5 years. If nodule grows or develops solid component(s), consider resection. These guidelines do not apply to immunocompromised patients and patients with cancer. Follow up in patients with significant comorbidities as  clinically warranted. For lung cancer screening, adhere to Lung-RADS guidelines. Reference: Radiology. 2017; 284(1):228-43. 4. Sigmoid diverticulosis without diverticulitis. 5.  Aortic Atherosclerosis (ICD10-I70.0). Electronically Signed   By: Randa Ngo M.D.   On: 12/26/2021 20:44   CT Angio Chest PE W and/or Wo Contrast  Result Date: 12/26/2021 CLINICAL DATA:  High probability for pulmonary embolus. Chest pain. Shortness of breath. EXAM: CT ANGIOGRAPHY CHEST WITH CONTRAST TECHNIQUE: Multidetector CT imaging of the chest was performed using the standard protocol during bolus administration of intravenous contrast. Multiplanar CT image reconstructions and MIPs were obtained to evaluate the vascular anatomy. RADIATION DOSE REDUCTION: This exam was performed according to the departmental dose-optimization program which includes automated exposure control, adjustment of the mA and/or kV according to patient size and/or use of iterative reconstruction technique. CONTRAST:  37m OMNIPAQUE IOHEXOL 350 MG/ML SOLN COMPARISON:  Chest x-ray on 12/26/2021 and CT chest on 02/15/2021 FINDINGS: Cardiovascular: Pulmonary arteries are well opacified by the contrast bolus in there is no acute embolus. Coronary artery calcifications are present. No pericardial effusion. Heart is mildly enlarged. Pulmonary arteries segments are dilated, consistent with pulmonary arterial hypertension. Thoracic aorta is minimally calcified but not aneurysmal. Mediastinum/Nodes: No enlarged mediastinal, hilar, or axillary lymph nodes. Thyroid gland, trachea, and esophagus demonstrate no significant findings. Lungs/Pleura: There are focal patchy areas of ground-glass opacity throughout the lungs bilaterally, consistent with infectious or inflammatory process. The no suspicious pulmonary nodules. No pleural effusions. The airways are patent. Upper Abdomen: Nodular, rounded contour of the liver is consistent with cirrhosis. Gallbladder is present.  Although only partially imaged, spleen appears enlarged, consistent with portal venous hypertension. Musculoskeletal: Degenerative changes in the midthoracic spine. Remote anterior cervical fusion. Review of the MIP images confirms the above findings. IMPRESSION: 1. Technically adequate exam showing no acute pulmonary embolus. 2. Mild cardiomegaly. 3. Pulmonary arterial enlargement consistent with pulmonary arterial hypertension. 4. Focal patchy ground-glass opacities throughout the lungs consistent with infectious or inflammatory process. 5. Hepatic cirrhosis. Suspect splenomegaly and portal venous hypertension. Electronically Signed   By: ENolon NationsM.D.   On: 12/26/2021 17:33   MR BRAIN WO CONTRAST  Result Date: 12/27/2021 CLINICAL DATA:  Initial evaluation for neuro deficit, stroke suspected. EXAM: MRI HEAD WITHOUT CONTRAST TECHNIQUE: Multiplanar, multiecho pulse sequences of the brain and surrounding structures were obtained without intravenous contrast. COMPARISON:  Prior CT from 07/20/2021. FINDINGS: Brain: Cerebral volume within normal limits for age. Patchy T2/FLAIR hyperintensity involving the periventricular and deep white matter both cerebral hemispheres most consistent with chronic small vessel ischemic disease, mild in nature. Small remote lacunar infarct present at the right basal ganglia/corona radiata. No evidence for acute or subacute infarct. Gray-white matter differentiation maintained. No areas of chronic cortical infarction. No acute or chronic blood products seen. No mass lesion, midline shift or mass effect. No hydrocephalus or extra-axial fluid collection. Pituitary gland suprasellar region normal. Midline structures intact. Vascular: Major intracranial vascular flow voids are maintained. Skull and upper cervical spine: Craniocervical junction normal. Bone marrow signal intensity within normal limits. Scalp soft tissues demonstrate no acute finding. Sinuses/Orbits: Prior ocular  lens replacement on the right. Globes and orbital soft tissues demonstrate no acute finding. Mild mucosal thickening noted within the ethmoidal air cells. Paranasal sinuses are otherwise largely clear. No mastoid effusion. Inner ear structures grossly normal. Other: None. IMPRESSION: 1. No acute intracranial abnormality. 2. 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 Electronically Signed   By: Jeannine Boga M.D.   On: 12/27/2021 03:45   DG Chest Portable 1 View  Result Date: 12/26/2021 CLINICAL DATA:  Chest pain.  Evaluate for CHF or pulmonary edema. EXAM: PORTABLE CHEST 1 VIEW COMPARISON:  02/15/2021 CT.  Most recent plain film 11/18/2019 FINDINGS: Cervical spine fixation. Patient rotated right. Midline trachea. Cardiomegaly accentuated by AP portable technique. Tortuous thoracic aorta. No pleural effusion or pneumothorax. Low lung volumes. Peripheral and basilar predominant septal thickening is new since the prior plain film. No lobar consolidation. There may be mild left base subsegmental atelectasis or scarring. IMPRESSION: Cardiomegaly with new septal thickening, favoring pulmonary venous congestion or mild interstitial edema. Electronically Signed   By: Abigail Miyamoto M.D.   On: 12/26/2021 16:03        ASSESSMENT & PLAN:  1. Iron deficiency anemia due to chronic blood loss   2. Splenomegaly   3. Cirrhosis of liver without ascites, unspecified hepatic cirrhosis type ([place])   4. Thrombocytopenia ([place])    # Iron deficiency anemia,  01/10/2022, hemoglobin 8.4, platelet count 149,000. Iron panel showed iron saturation of 8, ferritin level was 12. Consistent with iron deficiency.  Etiology of the iron deficiency likely secondary to chronic blood loss due to hypertension gastropathy. Recommend IV Venofer treatments. Plan IV iron with Venofer 25m weekly x 4 doses. Risks of infusion reactions including anaphylactic  reactions were discussed with patient. Other side effects include but not limited to high blood pressure, headache,wheezing, SOB, skin rash and itchiness, weight gain, leg swelling, etc. Patient voices understanding and is willing to proceed.  #Liver cirrhosis, splenomegaly. Recommend patient to closely follow-up with gastroenterology for management.  #Thrombocytopenia, platelet count slightly decreased.  Monitor . Orders Placed This Encounter  Procedures   CBC with Differential/Platelet    Standing Status:   Future    Standing Expiration Date:   01/11/2023   Ferritin    Standing Status:   Future    Standing Expiration Date:   01/11/2023   Iron and TIBC    Standing Status:   Future    Standing Expiration Date:   01/11/2023    All questions were answered. The patient knows to call the clinic with any problems questions or concerns.  Cc SIdelle Crouch MD  Return of visit: 3 months   ZEarlie Server MD, PhD  01/11/2022

## 2022-01-11 NOTE — Progress Notes (Signed)
Patient here for follow up. Patient complains of leg pain. States she has swelling and redness

## 2022-01-16 ENCOUNTER — Ambulatory Visit: Payer: Medicare PPO

## 2022-01-17 ENCOUNTER — Other Ambulatory Visit: Payer: Self-pay

## 2022-01-17 ENCOUNTER — Inpatient Hospital Stay: Payer: Medicare PPO

## 2022-01-17 VITALS — BP 115/69 | HR 97 | Temp 97.6°F | Resp 16

## 2022-01-17 DIAGNOSIS — D509 Iron deficiency anemia, unspecified: Secondary | ICD-10-CM | POA: Diagnosis not present

## 2022-01-17 DIAGNOSIS — D500 Iron deficiency anemia secondary to blood loss (chronic): Secondary | ICD-10-CM

## 2022-01-17 MED ORDER — SODIUM CHLORIDE 0.9 % IV SOLN
Freq: Once | INTRAVENOUS | Status: AC
  Filled 2022-01-17: qty 250

## 2022-01-17 MED ORDER — SODIUM CHLORIDE 0.9 % IV SOLN: 200.0000 mg | Freq: Once | INTRAVENOUS | Status: DC

## 2022-01-17 MED ORDER — IRON SUCROSE 20 MG/ML IV SOLN
200.0000 mg | Freq: Once | INTRAVENOUS | Status: AC
  Administered 2022-01-17: 200 mg via INTRAVENOUS
  Filled 2022-01-17: qty 10

## 2022-01-17 NOTE — Patient Instructions (Signed)

## 2022-01-23 ENCOUNTER — Ambulatory Visit: Payer: Medicare PPO

## 2022-01-24 ENCOUNTER — Other Ambulatory Visit: Payer: Self-pay

## 2022-01-24 ENCOUNTER — Inpatient Hospital Stay: Payer: Medicare PPO

## 2022-01-24 VITALS — BP 148/69 | HR 100 | Temp 97.0°F | Resp 18

## 2022-01-24 DIAGNOSIS — D509 Iron deficiency anemia, unspecified: Secondary | ICD-10-CM | POA: Diagnosis not present

## 2022-01-24 DIAGNOSIS — D500 Iron deficiency anemia secondary to blood loss (chronic): Secondary | ICD-10-CM

## 2022-01-24 MED ORDER — IRON SUCROSE 20 MG/ML IV SOLN
200.0000 mg | Freq: Once | INTRAVENOUS | Status: AC
  Administered 2022-01-24: 200 mg via INTRAVENOUS
  Filled 2022-01-24: qty 10

## 2022-01-24 MED ORDER — SODIUM CHLORIDE 0.9 % IV SOLN: 200.0000 mg | Freq: Once | INTRAVENOUS | Status: DC

## 2022-01-24 MED ORDER — SODIUM CHLORIDE 0.9 % IV SOLN
Freq: Once | INTRAVENOUS | Status: AC
  Filled 2022-01-24: qty 250

## 2022-01-30 ENCOUNTER — Ambulatory Visit: Payer: Medicare PPO

## 2022-01-31 ENCOUNTER — Other Ambulatory Visit: Payer: Self-pay

## 2022-01-31 ENCOUNTER — Inpatient Hospital Stay: Payer: Medicare PPO

## 2022-01-31 VITALS — BP 118/62 | HR 99 | Temp 97.3°F | Resp 16

## 2022-01-31 DIAGNOSIS — D509 Iron deficiency anemia, unspecified: Secondary | ICD-10-CM | POA: Diagnosis not present

## 2022-01-31 DIAGNOSIS — D500 Iron deficiency anemia secondary to blood loss (chronic): Secondary | ICD-10-CM

## 2022-01-31 MED ORDER — SODIUM CHLORIDE 0.9 % IV SOLN
Freq: Once | INTRAVENOUS | Status: AC
  Filled 2022-01-31: qty 250

## 2022-01-31 MED ORDER — IRON SUCROSE 20 MG/ML IV SOLN
200.0000 mg | Freq: Once | INTRAVENOUS | Status: AC
  Administered 2022-01-31: 200 mg via INTRAVENOUS
  Filled 2022-01-31: qty 10

## 2022-01-31 MED ORDER — SODIUM CHLORIDE 0.9 % IV SOLN: 200.0000 mg | Freq: Once | INTRAVENOUS | Status: DC

## 2022-01-31 NOTE — Patient Instructions (Signed)

## 2022-02-06 ENCOUNTER — Ambulatory Visit: Payer: Medicare PPO

## 2022-02-07 ENCOUNTER — Inpatient Hospital Stay: Payer: Medicare PPO | Attending: Oncology

## 2022-02-07 ENCOUNTER — Other Ambulatory Visit: Payer: Self-pay

## 2022-02-07 VITALS — BP 133/67 | HR 91 | Temp 96.3°F

## 2022-02-07 DIAGNOSIS — Z79899 Other long term (current) drug therapy: Secondary | ICD-10-CM | POA: Insufficient documentation

## 2022-02-07 DIAGNOSIS — D500 Iron deficiency anemia secondary to blood loss (chronic): Secondary | ICD-10-CM | POA: Insufficient documentation

## 2022-02-07 MED ORDER — SODIUM CHLORIDE 0.9 % IV SOLN
Freq: Once | INTRAVENOUS | Status: AC
  Filled 2022-02-07: qty 250

## 2022-02-07 MED ORDER — SODIUM CHLORIDE 0.9 % IV SOLN: 200.0000 mg | Freq: Once | INTRAVENOUS | Status: DC

## 2022-02-07 MED ORDER — IRON SUCROSE 20 MG/ML IV SOLN
200.0000 mg | Freq: Once | INTRAVENOUS | Status: AC
  Administered 2022-02-07: 200 mg via INTRAVENOUS
  Filled 2022-02-07: qty 10

## 2022-02-07 NOTE — Progress Notes (Signed)
Pt states she has been falling everyday for past few weeks.  Denies injury. States she is seeing her pcp for a workup.  States they are aware of frequent falls and she has another appt to see them in 2 days ?

## 2022-02-09 ENCOUNTER — Emergency Department: Payer: Medicare PPO

## 2022-02-09 ENCOUNTER — Emergency Department
Admission: EM | Admit: 2022-02-09 | Discharge: 2022-02-09 | Disposition: A | Discharge status: Home / Self Care | Payer: Medicare PPO | Attending: Emergency Medicine | Admitting: Emergency Medicine

## 2022-02-09 ENCOUNTER — Other Ambulatory Visit: Payer: Self-pay

## 2022-02-09 DIAGNOSIS — S32030A Wedge compression fracture of third lumbar vertebra, initial encounter for closed fracture: Secondary | ICD-10-CM

## 2022-02-09 DIAGNOSIS — S3992XA Unspecified injury of lower back, initial encounter: Secondary | ICD-10-CM | POA: Diagnosis present

## 2022-02-09 DIAGNOSIS — E119 Type 2 diabetes mellitus without complications: Secondary | ICD-10-CM | POA: Insufficient documentation

## 2022-02-09 DIAGNOSIS — W19XXXA Unspecified fall, initial encounter: Secondary | ICD-10-CM

## 2022-02-09 DIAGNOSIS — S32039A Unspecified fracture of third lumbar vertebra, initial encounter for closed fracture: Secondary | ICD-10-CM | POA: Insufficient documentation

## 2022-02-09 DIAGNOSIS — Y92002 Bathroom of unspecified non-institutional (private) residence single-family (private) house as the place of occurrence of the external cause: Secondary | ICD-10-CM | POA: Diagnosis not present

## 2022-02-09 DIAGNOSIS — S0990XA Unspecified injury of head, initial encounter: Secondary | ICD-10-CM | POA: Insufficient documentation

## 2022-02-09 DIAGNOSIS — G20 Parkinson's disease: Secondary | ICD-10-CM | POA: Insufficient documentation

## 2022-02-09 DIAGNOSIS — W182XXA Fall in (into) shower or empty bathtub, initial encounter: Secondary | ICD-10-CM | POA: Diagnosis not present

## 2022-02-09 LAB — COMPREHENSIVE METABOLIC PANEL
ALT: 23 U/L (ref 0–44)
AST: 32 U/L (ref 15–41)
Albumin: 3.1 g/dL — ABNORMAL LOW (ref 3.5–5.0)
Alkaline Phosphatase: 118 U/L (ref 38–126)
Anion gap: 8 (ref 5–15)
BUN: 10 mg/dL (ref 8–23)
CO2: 24 mmol/L (ref 22–32)
Calcium: 8.8 mg/dL — ABNORMAL LOW (ref 8.9–10.3)
Chloride: 109 mmol/L (ref 98–111)
Creatinine, Ser: 0.48 mg/dL (ref 0.44–1.00)
GFR, Estimated: >60 mL/min (ref >60)
Glucose, Bld: 128 mg/dL — ABNORMAL HIGH (ref 70–99)
Potassium: 3.9 mmol/L (ref 3.5–5.1)
Sodium: 141 mmol/L (ref 135–145)
Total Bilirubin: 1.1 mg/dL (ref 0.3–1.2)
Total Protein: 6.4 g/dL — ABNORMAL LOW (ref 6.5–8.1)

## 2022-02-09 LAB — CBC WITH DIFFERENTIAL/PLATELET
Abs Immature Granulocytes: 0.01 10*3/uL (ref 0.00–0.07)
Basophils Absolute: 0 10*3/uL (ref 0.0–0.1)
Basophils Relative: 1 %
Eosinophils Absolute: 0.1 10*3/uL (ref 0.0–0.5)
Eosinophils Relative: 2 %
HCT: 31.6 % — ABNORMAL LOW (ref 36.0–46.0)
Hemoglobin: 9.5 g/dL — ABNORMAL LOW (ref 12.0–15.0)
Immature Granulocytes: 0 %
Lymphocytes Relative: 17 %
Lymphs Abs: 0.5 10*3/uL — ABNORMAL LOW (ref 0.7–4.0)
MCH: 27.2 pg (ref 26.0–34.0)
MCHC: 30.1 g/dL (ref 30.0–36.0)
MCV: 90.5 fL (ref 80.0–100.0)
Monocytes Absolute: 0.4 10*3/uL (ref 0.1–1.0)
Monocytes Relative: 13 %
Neutro Abs: 1.8 10*3/uL (ref 1.7–7.7)
Neutrophils Relative %: 67 %
Platelets: 103 10*3/uL — ABNORMAL LOW (ref 150–400)
RBC: 3.49 MIL/uL — ABNORMAL LOW (ref 3.87–5.11)
RDW: 21.1 % — ABNORMAL HIGH (ref 11.5–15.5)
Smear Review: NORMAL
WBC: 2.7 10*3/uL — ABNORMAL LOW (ref 4.0–10.5)
nRBC: 0 % (ref 0.0–0.2)

## 2022-02-09 LAB — TROPONIN I (HIGH SENSITIVITY): Troponin I (High Sensitivity): 8 ng/L (ref <18)

## 2022-02-09 MED ORDER — HYDROCODONE-ACETAMINOPHEN 5-325 MG PO TABS: 1.0000 | ORAL_TABLET | Freq: Four times a day (QID) | ORAL | 0 refills | Status: DC | PRN

## 2022-02-09 MED ORDER — ONDANSETRON HCL 4 MG/2ML IJ SOLN
4.0000 mg | Freq: Once | INTRAMUSCULAR | Status: AC
  Administered 2022-02-09: 4 mg via INTRAVENOUS
  Filled 2022-02-09: qty 2

## 2022-02-09 MED ORDER — MORPHINE SULFATE (PF) 4 MG/ML IV SOLN
4.0000 mg | Freq: Once | INTRAVENOUS | Status: AC
  Administered 2022-02-09: 4 mg via INTRAVENOUS
  Filled 2022-02-09: qty 1

## 2022-02-09 NOTE — ED Triage Notes (Signed)
Pt presents to ED via [facility] with c/o of being on the commode this morning and states she went to reach for toilet paper and fell forward. Pt states she only walks short distances. EMS and pt denies LOC. Pt states lower back pain from fall. Pt has small skin tear to L shin area, bleeding controlled. VSS.   ?

## 2022-02-09 NOTE — Discharge Instructions (Signed)
Follow-up with Dr. Cari Caraway for evaluation of the compression fracture.  Follow-up with Dr. Doy Hutching as needed.  Return emergency department if you are worsening.  Your blood work is stable.  Your heart enzyme was normal. ?

## 2022-02-09 NOTE — ED Provider Notes (Signed)
? ?[facility] ?Provider Note ? ? ? Event Date/Time  ? First MD Initiated Contact with Patient 02/09/22 1020   ?  (approximate) ? ? ?History  ? ?Fall and Back Pain ? ? ?HPI ? ?Whitney [name] is a 81 y.o. female with history of diabetes, Parkinson's, weakness, presents emergency department after a fall off the toilet.  Patient was leaning forward trying to get the toilet paper when she fell onto the right side.  Is complaining of mid back pain, states she did not hit her head.  Denies numbness or tingling.  Patient states that she normally does not walk at home.  She rides a scooter.  Denies chest pain/shortness of breath ? ?  ? ? ?Physical Exam  ? ?Triage Vital Signs: ?ED Triage Vitals  ?Enc Vitals Group  ?   BP 02/09/22 1015 132/76  ?   Pulse Rate 02/09/22 1015 85  ?   Resp 02/09/22 1015 18  ?   Temp 02/09/22 1015 98 ?F (36.7 ?C)  ?   Temp Source 02/09/22 1015 Oral  ?   SpO2 02/09/22 1015 98 %  ?   Weight 02/09/22 1016 170 lb (77.1 kg)  ?   Height 02/09/22 1016 5\' 2"  (1.575 m)  ?   Head Circumference --   ?   Peak Flow --   ?   Pain Score 02/09/22 1016 8  ?   Pain Loc --   ?   Pain Edu? --   ?   Excl. in [place]? --   ? ? ?Most recent vital signs: ?Vitals:  ? 02/09/22 1100 02/09/22 1353  ?BP: (!) 113/59 120/60  ?Pulse: 86 80  ?Resp:  18  ?Temp:    ?SpO2: 97% 97%  ? ? ? ?General: Awake, no distress.   ?CV:  Good peripheral perfusion. regular rate and  rhythm ?Resp:  Normal effort. Lungs CTA ?Abd:  No distention.  Nontender ?Other:  T-spine is tender, C-spine is nontender, skull is nontender, lumbar spine is nontender ? ? ?ED Results / Procedures / Treatments  ? ?Labs ?(all labs ordered are listed, but only abnormal results are displayed) ?Labs Reviewed  ?COMPREHENSIVE METABOLIC PANEL - Abnormal; Notable for the following components:  ?    Result Value  ? Glucose, Bld 128 (*)   ? Calcium 8.8 (*)   ? Total Protein 6.4 (*)   ? Albumin 3.1 (*)   ? All other components within normal limits  ?CBC WITH  DIFFERENTIAL/PLATELET - Abnormal; Notable for the following components:  ? WBC 2.7 (*)   ? RBC 3.49 (*)   ? Hemoglobin 9.5 (*)   ? HCT 31.6 (*)   ? RDW 21.1 (*)   ? Platelets 103 (*)   ? Lymphs Abs 0.5 (*)   ? All other components within normal limits  ?TROPONIN I (HIGH SENSITIVITY)  ?TROPONIN I (HIGH SENSITIVITY)  ? ? ? ?EKG ? ?EKG ? ? ?RADIOLOGY ?CT of the head and T-spine, x-ray left humerus ? ? ? ?PROCEDURES: ? ? ?Procedures ? ? ?MEDICATIONS ORDERED IN ED: ?Medications  ?morphine (PF) 4 MG/ML injection 4 mg (4 mg Intravenous Given 02/09/22 1302)  ?ondansetron (ZOFRAN) injection 4 mg (4 mg Intravenous Given 02/09/22 1301)  ? ? ? ?IMPRESSION / MDM / ASSESSMENT AND PLAN / ED COURSE  ?I reviewed the triage vital signs and the nursing notes. ?             ?               ? ?  Differential diagnosis includes, but is not limited to, subdural hematoma, SAH, thoracic spine fracture, weakness, A-fib ? ?Imaging and EKG ordered.  Will evaluate the EKG to see if we need labs.  Seems to be strictly a mechanical fall. ? ?EKG shows sinus rhythm with PVCs, see physician ? ? ?X-ray of the left humerus was independently reviewed by me and do not see a new fracture, radiologist reads as old deformity fracture ? ?CT of the head was independently reviewed by me, awaiting radiology read.  Radiologist is read as no acute intracranial abnormality ? ?CT of the T-spine was reviewed by me does show some old compression fractures in the upper T-spine, ?Radiologist is read as old compression fracture and T-spine, new L3 vertebral compression fracture with loss of height by 10% ? ?I did explain all these findings to the patient.  Due to her past medical history along with her fall and new compression fracture lab orders were placed. ? ?CBC and metabolic panel to have abnormalities but they are within the patient's normal ranges.  There is been no decrease in her WBC, H&H.  Glucose is 128 which is appropriate for the patient. ? ?The patient's had  pain control with the medication.  We will discharge her with pain medication for the compression fracture.  Did explain all CT and x-ray results to the patient and her daughter.  She was given a prescription for hydrocodone which was sent to the [facility] in [place].  She is to follow-up with Dr. Cari Caraway at [facility] concerning the compression fracture.  Apply ice to the area that hurts.  She was discharged in stable condition. ? ? ?  ? ? ?FINAL CLINICAL IMPRESSION(S) / ED DIAGNOSES  ? ?Final diagnoses:  ?Compression fracture of L3 lumbar vertebra, closed, initial encounter ([place])  ?Fall, initial encounter  ? ? ? ?Rx / DC Orders  ? ?ED Discharge Orders   ? ?      Ordered  ?  HYDROcodone-acetaminophen (NORCO/VICODIN) 5-325 MG tablet  Every 6 hours PRN       ? 02/09/22 1422  ? ?  ?  ? ?  ? ? ? ?Note:  This document was prepared using Dragon voice recognition software and may include unintentional dictation errors. ? ?  ?Versie Starks, PA-C ?02/09/22 1424 ? ?  ?Lucrezia Starch, MD ?02/09/22 1531 ? ?

## 2022-02-14 ENCOUNTER — Other Ambulatory Visit: Payer: Self-pay

## 2022-02-14 ENCOUNTER — Emergency Department: Payer: Medicare PPO

## 2022-02-14 ENCOUNTER — Emergency Department
Admission: EM | Admit: 2022-02-14 | Discharge: 2022-02-14 | Disposition: A | Discharge status: Home / Self Care | Payer: Medicare PPO | Attending: Emergency Medicine | Admitting: Emergency Medicine

## 2022-02-14 DIAGNOSIS — M545 Low back pain, unspecified: Secondary | ICD-10-CM | POA: Insufficient documentation

## 2022-02-14 DIAGNOSIS — W08XXXA Fall from other furniture, initial encounter: Secondary | ICD-10-CM | POA: Insufficient documentation

## 2022-02-14 DIAGNOSIS — W19XXXA Unspecified fall, initial encounter: Secondary | ICD-10-CM

## 2022-02-14 MED ORDER — ACETAMINOPHEN 500 MG PO TABS
1000.0000 mg | ORAL_TABLET | Freq: Once | ORAL | Status: AC
  Administered 2022-02-14: 1000 mg via ORAL
  Filled 2022-02-14: qty 2

## 2022-02-14 MED ORDER — LIDOCAINE 5 % EX PTCH: 1.0000 | MEDICATED_PATCH | Freq: Two times a day (BID) | CUTANEOUS | 0 refills | Status: DC

## 2022-02-14 MED ORDER — OXYCODONE HCL 5 MG PO TABS
5.0000 mg | ORAL_TABLET | Freq: Once | ORAL | Status: AC
  Administered 2022-02-14: 5 mg via ORAL
  Filled 2022-02-14: qty 1

## 2022-02-14 MED ORDER — METHOCARBAMOL 500 MG PO TABS: 500.0000 mg | ORAL_TABLET | Freq: Three times a day (TID) | ORAL | 0 refills | Status: DC | PRN

## 2022-02-14 NOTE — ED Provider Notes (Signed)
? ?[facility] ?Provider Note ? ? ? Event Date/Time  ? First MD Initiated Contact with Patient 02/14/22 0535   ?  (approximate) ? ? ?History  ? ?Fall and Back Pain ? ? ?HPI ? ?Whitney [name] is a 81 y.o. female who presents to the ED for evaluation of Fall and Back Pain ?  ?Review ED visit from 5 days ago where patient was seen for a similar fall and was diagnosed with L3 compression fracture with minimal height loss.  Discharged with Norco. ? ?Patient lives at home.  She has caregivers during the daytime, but not overnight.  She is not ambulatory.  She has an upcoming appointment with orthopedic spine on 3/14. ? ?Patient presents to the ED from home via EMS for evaluation of worsening lower back pain after she slid out of her recliner this morning.  She reports just sliding forward from her recliner, sliding down and striking her bottom on the floor.  She called her daughter who found her seated on the chair, she was able to get back onto this chair, but daughter could not get her in the car so they called 911. ? ?Patient reports similar lower back pain that she has been feeling for the past week since her last fall, but seems more severe now.  She reports only having 1 Norco left and is concerned that she cannot make it to her orthopedic appointment. ? ?Denies urinary or stool retention, fevers or saddle anesthesias.  Denies additional injuries or pain beyond her lower back.  Denies syncopal episodes or trauma beyond this. ? ?Physical Exam  ? ?Triage Vital Signs: ?ED Triage Vitals  ?Enc Vitals Group  ?   BP   ?   Pulse   ?   Resp   ?   Temp   ?   Temp src   ?   SpO2   ?   Weight   ?   Height   ?   Head Circumference   ?   Peak Flow   ?   Pain Score   ?   Pain Loc   ?   Pain Edu?   ?   Excl. in [place]?   ? ? ?Most recent vital signs: ?Vitals:  ? 02/14/22 0536 02/14/22 0630  ?BP: (!) 152/78 133/65  ?Pulse: (!) 115 (!) 106  ?Resp: 18 17  ?Temp: 98.5 ?F (36.9 ?C)   ?SpO2: 98% 99%   ? ? ?General: Awake, no distress.  Obese.  Appears uncomfortable.  Does help turn herself to the side and hold on gurney railing so I can examine her back  ?CV:  Good peripheral perfusion.  ?Resp:  Normal effort.  ?Abd:  No distention.  ?MSK:  No deformity noted.  Lidocaine patch to the upper lumbar spine.  No spinal step-offs or signs of trauma to the back.  Mild upper L-spine tenderness without point focal tenderness or any bony step-offs. ?Palpation of all 4 extremities without evidence of deformity, tenderness or trauma.  ?Full passive range of motion to bilateral lower extremities, active range of motion to the bilateral upper extremities. ?Neuro:  No focal deficits appreciated. ?Other:   ? ? ?ED Results / Procedures / Treatments  ? ?Labs ?(all labs ordered are listed, but only abnormal results are displayed) ?Labs Reviewed - No data to display ? ?EKG ?Sinus tachycardia with a rate of 116 bpm.  Rightward axis.  Normal intervals.  No STEMI.  Nonspecific ST changes inferiorly.  Low voltage. ?Similar to EKG from 5 days ago. ? ?RADIOLOGY ?CT lumbar reviewed by me with chronic L3 changes without acute fracture ? ?Official radiology report(s): ?CT Lumbar Spine Wo Contrast ? ?Result Date: 02/14/2022 ?CLINICAL DATA:  Fall, evaluate compression deformity EXAM: CT LUMBAR SPINE WITHOUT CONTRAST TECHNIQUE: Multidetector CT imaging of the lumbar spine was performed without intravenous contrast administration. Multiplanar CT image reconstructions were also generated. RADIATION DOSE REDUCTION: This exam was performed according to the departmental dose-optimization program which includes automated exposure control, adjustment of the mA and/or kV according to patient size and/or use of iterative reconstruction technique. COMPARISON:  Thoracic spine CT from 5 days ago. FINDINGS: Segmentation: 5 lumbar type vertebrae Alignment: Degenerative grade 1 anterolisthesis at L4-5 and L5-S1. No traumatic malalignment Vertebrae: Subacute L3  superior endplate fracture with mild depression. No interval fracture or evidence of aggressive bone lesion. Generalized osteopenia. Chronic left pars defect at L5 Paraspinal and other soft tissues: No acute finding. Cirrhosis and splenomegaly. Disc levels: T12- L1: Unremarkable. L1-L2: Disc narrowing and bulging with ventral spurring L2-L3: Disc bulging and bilateral facet spurring L3-L4: Disc bulging and ventral spurring. L4-L5: Facet osteoarthritis with spurring and mild anterolisthesis. The disc is narrowed and bulging. Asymmetric right subarticular recess narrowing that could affect the right L5 nerve root. L5-S1:Facet spurring and mild anterolisthesis. Minor disc bulging for age. IMPRESSION: 1. No acute finding. 2. Known L3 superior endplate fracture with mild height loss that is unchanged from 5 days ago. Lumbar spine degeneration as described. Electronically Signed   By: Jorje Guild M.D.   On: 02/14/2022 06:25   ? ?PROCEDURES and INTERVENTIONS: ? ?Procedures ? ?Medications  ?acetaminophen (TYLENOL) tablet 1,000 mg (1,000 mg Oral Given 02/14/22 0547)  ?oxyCODONE (Oxy IR/ROXICODONE) immediate release tablet 5 mg (5 mg Oral Given 02/14/22 0547)  ? ? ? ?IMPRESSION / MDM / ASSESSMENT AND PLAN / ED COURSE  ?I reviewed the triage vital signs and the nursing notes. ? ?81 year old female presents to the ED with lumbar pain after sliding down from her recliner this morning, without evidence of significant acute injury and suitable for outpatient management.  No evidence of neurologic or vascular deficits.  No clear signs of acute trauma.  Has some mild tenderness to her upper lumbar spine where she was recently diagnosed with an L1 fracture.  CT imaging obtained of this area to help elucidate acute versus chronic injury, and this does not show any evidence of acute fracture or dislocation.  Pain is well controlled with oral analgesics and I see no barriers to outpatient management.  Considering her well-controlled  pain and I see no indication for bracing at this point.  She has upcoming follow-up appointment with orthospine in less than 1 week which I think is appropriate follow-up.  We discussed return precautions and fall precautions at home. ? ?Clinical Course as of 02/14/22 0638  ?Wed Feb 14, 2022  ?0551 Daughter now at the bedside.  We discussed plan of care.  All are in agreement. [DS]  ?61 Reassessed.  Patient reports feeling much better and that the pain has now eased off.  We discussed CT results without acute features.  I reexamined the patient and continues to have a reassuring exam.  Discussed plan of care with daughter and patient and they are agreeable with outpatient management.  We discussed being careful with opiates regularly considering her age.  We discussed multimodal pain control at home and return precautions. [DS]  ?  ?Clinical Course User Index ?[DS]  Vladimir Crofts, MD  ? ? ? ?FINAL CLINICAL IMPRESSION(S) / ED DIAGNOSES  ? ?Final diagnoses:  ?Acute bilateral low back pain without sciatica  ?Fall, initial encounter  ? ? ? ?Rx / DC Orders  ? ?ED Discharge Orders   ? ?      Ordered  ?  methocarbamol (ROBAXIN) 500 MG tablet  Every 8 hours PRN       ? 02/14/22 0633  ?  lidocaine (LIDODERM) 5 %  Every 12 hours       ? 02/14/22 [id number]  ? ?  ?  ? ?  ? ? ? ?Note:  This document was prepared using Dragon voice recognition software and may include unintentional dictation errors. ?  ?Vladimir Crofts, MD ?02/14/22 [id number] ? ?

## 2022-02-14 NOTE — ED Triage Notes (Signed)
Pt states she fell around 2am this morning and now complains of lower back pain. Pt was seen most recently for a vertebrae Fx.  ?

## 2022-02-14 NOTE — Discharge Instructions (Addendum)
Use Tylenol for pain and fevers.  Up to 1000 mg per dose, up to 4 times per day.  Do not take more than 4000 mg of Tylenol/acetaminophen within 24 hours.. ? ?Please use lidocaine patches at your site of pain.  Apply 1 patch at a time, leave on for 12 hours, then remove for 12 hours.  12 hours on, 12 hours off.  Do not apply more than 1 patch at a time. ? ?Use the Robaxin/methocarbamol muscle relaxer for more severe/breakthrough pain. ?

## 2022-02-20 ENCOUNTER — Other Ambulatory Visit: Payer: Self-pay | Admitting: Orthopedic Surgery

## 2022-02-20 ENCOUNTER — Ambulatory Visit
Admission: RE | Admit: 2022-02-20 | Discharge: 2022-02-20 | Disposition: A | Discharge status: Home / Self Care | Payer: Medicare PPO | Source: Ambulatory Visit | Attending: Orthopedic Surgery | Admitting: Orthopedic Surgery

## 2022-02-20 DIAGNOSIS — S32030A Wedge compression fracture of third lumbar vertebra, initial encounter for closed fracture: Secondary | ICD-10-CM

## 2022-02-26 DIAGNOSIS — M8008XA Age-related osteoporosis with current pathological fracture, vertebra(e), initial encounter for fracture: Secondary | ICD-10-CM | POA: Insufficient documentation

## 2022-02-26 DIAGNOSIS — S32030A Wedge compression fracture of third lumbar vertebra, initial encounter for closed fracture: Secondary | ICD-10-CM | POA: Insufficient documentation

## 2022-03-01 ENCOUNTER — Emergency Department: Payer: Medicare PPO

## 2022-03-01 ENCOUNTER — Inpatient Hospital Stay
Admission: EM | Admit: 2022-03-01 | Discharge: 2022-03-06 | DRG: 175 | Disposition: A | Discharge status: Skilled Nursing Facility | Payer: Medicare PPO | Attending: Internal Medicine | Admitting: Internal Medicine

## 2022-03-01 ENCOUNTER — Other Ambulatory Visit: Payer: Self-pay

## 2022-03-01 ENCOUNTER — Encounter: Payer: Self-pay | Admitting: Radiology

## 2022-03-01 DIAGNOSIS — K639 Disease of intestine, unspecified: Secondary | ICD-10-CM | POA: Diagnosis present

## 2022-03-01 DIAGNOSIS — Z20822 Contact with and (suspected) exposure to covid-19: Secondary | ICD-10-CM | POA: Diagnosis present

## 2022-03-01 DIAGNOSIS — K5900 Constipation, unspecified: Secondary | ICD-10-CM | POA: Diagnosis not present

## 2022-03-01 DIAGNOSIS — R9431 Abnormal electrocardiogram [ECG] [EKG]: Secondary | ICD-10-CM | POA: Diagnosis present

## 2022-03-01 DIAGNOSIS — K7460 Unspecified cirrhosis of liver: Secondary | ICD-10-CM | POA: Diagnosis present

## 2022-03-01 DIAGNOSIS — K219 Gastro-esophageal reflux disease without esophagitis: Secondary | ICD-10-CM | POA: Diagnosis present

## 2022-03-01 DIAGNOSIS — Z85850 Personal history of malignant neoplasm of thyroid: Secondary | ICD-10-CM

## 2022-03-01 DIAGNOSIS — Z833 Family history of diabetes mellitus: Secondary | ICD-10-CM

## 2022-03-01 DIAGNOSIS — I2609 Other pulmonary embolism with acute cor pulmonale: Principal | ICD-10-CM | POA: Diagnosis present

## 2022-03-01 DIAGNOSIS — Z8249 Family history of ischemic heart disease and other diseases of the circulatory system: Secondary | ICD-10-CM

## 2022-03-01 DIAGNOSIS — E1165 Type 2 diabetes mellitus with hyperglycemia: Secondary | ICD-10-CM | POA: Diagnosis not present

## 2022-03-01 DIAGNOSIS — Z90710 Acquired absence of both cervix and uterus: Secondary | ICD-10-CM

## 2022-03-01 DIAGNOSIS — I82811 Embolism and thrombosis of superficial veins of right lower extremities: Secondary | ICD-10-CM | POA: Diagnosis present

## 2022-03-01 DIAGNOSIS — E559 Vitamin D deficiency, unspecified: Secondary | ICD-10-CM | POA: Diagnosis present

## 2022-03-01 DIAGNOSIS — F329 Major depressive disorder, single episode, unspecified: Secondary | ICD-10-CM | POA: Diagnosis present

## 2022-03-01 DIAGNOSIS — I8510 Secondary esophageal varices without bleeding: Secondary | ICD-10-CM | POA: Diagnosis present

## 2022-03-01 DIAGNOSIS — Z66 Do not resuscitate: Secondary | ICD-10-CM | POA: Diagnosis present

## 2022-03-01 DIAGNOSIS — D693 Immune thrombocytopenic purpura: Secondary | ICD-10-CM | POA: Diagnosis present

## 2022-03-01 DIAGNOSIS — G20 Parkinson's disease: Secondary | ICD-10-CM | POA: Diagnosis present

## 2022-03-01 DIAGNOSIS — Z801 Family history of malignant neoplasm of trachea, bronchus and lung: Secondary | ICD-10-CM

## 2022-03-01 DIAGNOSIS — Z8051 Family history of malignant neoplasm of kidney: Secondary | ICD-10-CM

## 2022-03-01 DIAGNOSIS — K3189 Other diseases of stomach and duodenum: Secondary | ICD-10-CM | POA: Diagnosis present

## 2022-03-01 DIAGNOSIS — K5730 Diverticulosis of large intestine without perforation or abscess without bleeding: Secondary | ICD-10-CM | POA: Diagnosis present

## 2022-03-01 DIAGNOSIS — Z8616 Personal history of COVID-19: Secondary | ICD-10-CM

## 2022-03-01 DIAGNOSIS — E041 Nontoxic single thyroid nodule: Secondary | ICD-10-CM | POA: Diagnosis present

## 2022-03-01 DIAGNOSIS — D509 Iron deficiency anemia, unspecified: Secondary | ICD-10-CM | POA: Diagnosis present

## 2022-03-01 DIAGNOSIS — Z79899 Other long term (current) drug therapy: Secondary | ICD-10-CM

## 2022-03-01 DIAGNOSIS — I272 Pulmonary hypertension, unspecified: Secondary | ICD-10-CM | POA: Diagnosis present

## 2022-03-01 DIAGNOSIS — I2699 Other pulmonary embolism without acute cor pulmonale: Secondary | ICD-10-CM | POA: Diagnosis not present

## 2022-03-01 DIAGNOSIS — Z841 Family history of disorders of kidney and ureter: Secondary | ICD-10-CM

## 2022-03-01 DIAGNOSIS — K7469 Other cirrhosis of liver: Secondary | ICD-10-CM

## 2022-03-01 DIAGNOSIS — K449 Diaphragmatic hernia without obstruction or gangrene: Secondary | ICD-10-CM | POA: Diagnosis present

## 2022-03-01 DIAGNOSIS — R0603 Acute respiratory distress: Secondary | ICD-10-CM | POA: Diagnosis present

## 2022-03-01 DIAGNOSIS — E890 Postprocedural hypothyroidism: Secondary | ICD-10-CM | POA: Diagnosis present

## 2022-03-01 DIAGNOSIS — E8881 Metabolic syndrome: Secondary | ICD-10-CM | POA: Diagnosis present

## 2022-03-01 DIAGNOSIS — Z87891 Personal history of nicotine dependence: Secondary | ICD-10-CM

## 2022-03-01 DIAGNOSIS — Z7984 Long term (current) use of oral hypoglycemic drugs: Secondary | ICD-10-CM

## 2022-03-01 DIAGNOSIS — N3281 Overactive bladder: Secondary | ICD-10-CM | POA: Diagnosis present

## 2022-03-01 DIAGNOSIS — D649 Anemia, unspecified: Secondary | ICD-10-CM

## 2022-03-01 DIAGNOSIS — Z823 Family history of stroke: Secondary | ICD-10-CM

## 2022-03-01 DIAGNOSIS — Z800 Family history of malignant neoplasm of digestive organs: Secondary | ICD-10-CM

## 2022-03-01 DIAGNOSIS — E119 Type 2 diabetes mellitus without complications: Secondary | ICD-10-CM | POA: Diagnosis present

## 2022-03-01 LAB — URINALYSIS, ROUTINE W REFLEX MICROSCOPIC
Bacteria, UA: NONE SEEN
Bilirubin Urine: NEGATIVE
Glucose, UA: NEGATIVE mg/dL
Hgb urine dipstick: NEGATIVE
Ketones, ur: 5 mg/dL — AB
Nitrite: NEGATIVE
Protein, ur: NEGATIVE mg/dL
Specific Gravity, Urine: >1.046 — ABNORMAL HIGH (ref 1.005–1.030)
pH: 5 (ref 5.0–8.0)

## 2022-03-01 LAB — PROTIME-INR
INR: 1.3 — ABNORMAL HIGH (ref 0.8–1.2)
Prothrombin Time: 16.5 seconds — ABNORMAL HIGH (ref 11.4–15.2)

## 2022-03-01 LAB — COMPREHENSIVE METABOLIC PANEL WITH GFR
ALT: 22 U/L (ref 0–44)
AST: 43 U/L — ABNORMAL HIGH (ref 15–41)
Albumin: 2.9 g/dL — ABNORMAL LOW (ref 3.5–5.0)
Alkaline Phosphatase: 125 U/L (ref 38–126)
Anion gap: 11 (ref 5–15)
BUN: 19 mg/dL (ref 8–23)
CO2: 21 mmol/L — ABNORMAL LOW (ref 22–32)
Calcium: 9.1 mg/dL (ref 8.9–10.3)
Chloride: 105 mmol/L (ref 98–111)
Creatinine, Ser: 0.64 mg/dL (ref 0.44–1.00)
GFR, Estimated: >60 mL/min
Glucose, Bld: 218 mg/dL — ABNORMAL HIGH (ref 70–99)
Potassium: 4.2 mmol/L (ref 3.5–5.1)
Sodium: 137 mmol/L (ref 135–145)
Total Bilirubin: 0.8 mg/dL (ref 0.3–1.2)
Total Protein: 6 g/dL — ABNORMAL LOW (ref 6.5–8.1)

## 2022-03-01 LAB — CBC
HCT: 27.2 % — ABNORMAL LOW (ref 36.0–46.0)
Hemoglobin: 8 g/dL — ABNORMAL LOW (ref 12.0–15.0)
MCH: 27.3 pg (ref 26.0–34.0)
MCHC: 29.4 g/dL — ABNORMAL LOW (ref 30.0–36.0)
MCV: 92.8 fL (ref 80.0–100.0)
Platelets: 174 K/uL (ref 150–400)
RBC: 2.93 MIL/uL — ABNORMAL LOW (ref 3.87–5.11)
RDW: 21.2 % — ABNORMAL HIGH (ref 11.5–15.5)
WBC: 5.5 K/uL (ref 4.0–10.5)
nRBC: 0 % (ref 0.0–0.2)

## 2022-03-01 LAB — RESP PANEL BY RT-PCR (FLU A&B, COVID) ARPGX2
Influenza A by PCR: NEGATIVE
Influenza B by PCR: NEGATIVE
SARS Coronavirus 2 by RT PCR: NEGATIVE

## 2022-03-01 LAB — GLUCOSE, CAPILLARY: Glucose-Capillary: 139 mg/dL — ABNORMAL HIGH (ref 70–99)

## 2022-03-01 LAB — D-DIMER, QUANTITATIVE: D-Dimer, Quant: 3.33 ug{FEU}/mL — ABNORMAL HIGH (ref 0.00–0.50)

## 2022-03-01 LAB — MAGNESIUM: Magnesium: 1.8 mg/dL (ref 1.7–2.4)

## 2022-03-01 LAB — APTT: aPTT: 29 seconds (ref 24–36)

## 2022-03-01 LAB — LIPASE, BLOOD: Lipase: 42 U/L (ref 11–51)

## 2022-03-01 MED ORDER — HEPARIN SODIUM (PORCINE) 5000 UNIT/ML IJ SOLN
4000.0000 [IU] | Freq: Once | INTRAMUSCULAR | Status: AC
Start: 2022-03-01 — End: 2022-03-01
  Administered 2022-03-01: 4000 [IU] via INTRAVENOUS
  Filled 2022-03-01: qty 1

## 2022-03-01 MED ORDER — LACTATED RINGERS IV BOLUS
1000.0000 mL | Freq: Once | INTRAVENOUS | Status: AC
  Administered 2022-03-01: 1000 mL via INTRAVENOUS

## 2022-03-01 MED ORDER — ACETAMINOPHEN 650 MG RE SUPP: 650.0000 mg | Freq: Four times a day (QID) | RECTAL | Status: DC | PRN

## 2022-03-01 MED ORDER — HEPARIN (PORCINE) 25000 UT/250ML-% IV SOLN
1150.0000 [IU]/h | INTRAVENOUS | Status: DC
  Administered 2022-03-01 – 2022-03-03 (×3): 1150 [IU]/h via INTRAVENOUS
  Filled 2022-03-01 (×3): qty 250

## 2022-03-01 MED ORDER — FERROUS SULFATE 325 (65 FE) MG PO TABS
325.0000 mg | ORAL_TABLET | Freq: Every day | ORAL | Status: DC
  Administered 2022-03-02 – 2022-03-06 (×5): 325 mg via ORAL
  Filled 2022-03-01 (×5): qty 1

## 2022-03-01 MED ORDER — VITAMIN D 25 MCG (1000 UNIT) PO TABS
1000.0000 [IU] | ORAL_TABLET | Freq: Every day | ORAL | Status: DC
  Administered 2022-03-02 – 2022-03-06 (×5): 1000 [IU] via ORAL
  Filled 2022-03-01 (×5): qty 1

## 2022-03-01 MED ORDER — AMANTADINE HCL 100 MG PO CAPS
100.0000 mg | ORAL_CAPSULE | Freq: Two times a day (BID) | ORAL | Status: DC
  Administered 2022-03-01 – 2022-03-06 (×10): 100 mg via ORAL
  Filled 2022-03-01 (×10): qty 1

## 2022-03-01 MED ORDER — INSULIN ASPART 100 UNIT/ML IJ SOLN
0.0000 [IU] | Freq: Three times a day (TID) | INTRAMUSCULAR | Status: DC
  Administered 2022-03-02 – 2022-03-03 (×6): 3 [IU] via SUBCUTANEOUS
  Administered 2022-03-03: 5 [IU] via SUBCUTANEOUS
  Administered 2022-03-04: 3 [IU] via SUBCUTANEOUS
  Administered 2022-03-04: 2 [IU] via SUBCUTANEOUS
  Administered 2022-03-04: 1 [IU] via SUBCUTANEOUS
  Administered 2022-03-04 – 2022-03-05 (×2): 2 [IU] via SUBCUTANEOUS
  Administered 2022-03-05 (×3): 3 [IU] via SUBCUTANEOUS
  Administered 2022-03-06: 2 [IU] via SUBCUTANEOUS
  Administered 2022-03-06: 3 [IU] via SUBCUTANEOUS
  Filled 2022-03-01 (×17): qty 1

## 2022-03-01 MED ORDER — PANTOPRAZOLE SODIUM 40 MG PO TBEC
40.0000 mg | DELAYED_RELEASE_TABLET | Freq: Every day | ORAL | Status: DC
  Administered 2022-03-02: 40 mg via ORAL
  Filled 2022-03-01: qty 1

## 2022-03-01 MED ORDER — IOHEXOL 350 MG/ML SOLN
75.0000 mL | Freq: Once | INTRAVENOUS | Status: AC | PRN
  Administered 2022-03-01: 75 mL via INTRAVENOUS

## 2022-03-01 MED ORDER — LACTULOSE 10 GM/15ML PO SOLN
10.0000 g | Freq: Every day | ORAL | Status: DC
  Administered 2022-03-02 – 2022-03-03 (×2): 10 g via ORAL
  Filled 2022-03-01 (×2): qty 30

## 2022-03-01 MED ORDER — METHOCARBAMOL 500 MG PO TABS: 500.0000 mg | ORAL_TABLET | Freq: Three times a day (TID) | ORAL | Status: DC | PRN

## 2022-03-01 MED ORDER — IOHEXOL 300 MG/ML  SOLN
100.0000 mL | Freq: Once | INTRAMUSCULAR | Status: AC | PRN
  Administered 2022-03-01: 100 mL via INTRAVENOUS

## 2022-03-01 MED ORDER — BUPROPION HCL ER (XL) 150 MG PO TB24
150.0000 mg | ORAL_TABLET | Freq: Every day | ORAL | Status: DC
  Administered 2022-03-02 – 2022-03-06 (×5): 150 mg via ORAL
  Filled 2022-03-01 (×5): qty 1

## 2022-03-01 MED ORDER — POLYETHYLENE GLYCOL 3350 17 G PO PACK: 17.0000 g | PACK | Freq: Every day | ORAL | Status: DC | PRN

## 2022-03-01 MED ORDER — TROSPIUM CHLORIDE 20 MG PO TABS
20.0000 mg | ORAL_TABLET | Freq: Two times a day (BID) | ORAL | Status: DC
  Administered 2022-03-02 – 2022-03-06 (×9): 20 mg via ORAL
  Filled 2022-03-01 (×9): qty 1

## 2022-03-01 MED ORDER — ONDANSETRON HCL 4 MG/2ML IJ SOLN
4.0000 mg | Freq: Once | INTRAMUSCULAR | Status: AC
  Administered 2022-03-01: 4 mg via INTRAVENOUS
  Filled 2022-03-01: qty 2

## 2022-03-01 MED ORDER — ACETAMINOPHEN 325 MG PO TABS
650.0000 mg | ORAL_TABLET | Freq: Four times a day (QID) | ORAL | Status: DC | PRN
  Administered 2022-03-03 – 2022-03-05 (×3): 650 mg via ORAL
  Filled 2022-03-01 (×3): qty 2

## 2022-03-01 NOTE — Assessment & Plan Note (Addendum)
On CT scan, there is a thickened mucosa in the cecum concerning for malignancy.  Patient will need colonoscopy eventually.  However, given acute PE, colonoscopy should be delayed. ?Discussed with Dr. Marius Ditch, will schedule outpatient colonoscopy after discharge. ?

## 2022-03-01 NOTE — Assessment & Plan Note (Addendum)
No evidence of GI bleed.  Hemoglobin has been stable.  Currently on Protonix twice a day. ?

## 2022-03-01 NOTE — Assessment & Plan Note (Addendum)
Follow up with PCP

## 2022-03-01 NOTE — Assessment & Plan Note (Addendum)
Continue PPI ?

## 2022-03-01 NOTE — ED Provider Notes (Signed)
? ?[facility] ?Provider Note ? ? ? Event Date/Time  ? First MD Initiated Contact with Patient 03/01/22 1734   ?  (approximate) ? ? ?History  ? ?Emesis and Fall ? ? ?HPI ? ?Whitney [name] is a 81 y.o. female who presents to the ED for evaluation of Emesis and Fall ?  ?Patient presents to the ED for evaluation of recurrent emesis.  She reports primarily postprandial emesis throughout the day today.  About 30 minutes after eating her breakfast and lunch she had multiple episodes of nonbloody nonbilious emesis. ? ?When asked about a fall, she reports "oh I always fall."  She reports 30 falls in the past couple months.  Denies any significant injuries recently.  Denies any syncopal episodes. ? ?Denies any chest pain or shortness of breath.  Reports feeling generalized weakness and weakness to her legs when she gets up. ? ?Physical Exam  ? ?Triage Vital Signs: ?ED Triage Vitals  ?Enc Vitals Group  ?   BP 03/01/22 1446 (!) 118/54  ?   Pulse Rate 03/01/22 1446 (!) 118  ?   Resp 03/01/22 1446 18  ?   Temp 03/01/22 1446 98.7 ?F (37.1 ?C)  ?   Temp Source 03/01/22 1446 Oral  ?   SpO2 03/01/22 1446 97 %  ?   Weight 03/01/22 1447 167 lb (75.8 kg)  ?   Height 03/01/22 1447 5\' 2"  (1.575 m)  ?   Head Circumference --   ?   Peak Flow --   ?   Pain Score 03/01/22 1446 0  ?   Pain Loc --   ?   Pain Edu? --   ?   Excl. in [place]? --   ? ? ?Most recent vital signs: ?Vitals:  ? 03/01/22 1900 03/01/22 2000  ?BP: 122/70 (!) 118/59  ?Pulse: (!) 109 (!) 109  ?Resp: (!) 22 (!) 21  ?Temp:    ?SpO2: 98% 97%  ? ? ?General: Awake, no distress.  ?CV:  Good peripheral perfusion.  Tachycardic and regular ?Resp:   Minimal tachypnea to the low 20s.  Clear lungs. ?Abd:  No distention.  RUQ tenderness and LLQ tenderness without guarding or peritoneal features. ?MSK:  No deformity noted.  ?Neuro:  No focal deficits appreciated. ?Other:   ? ? ?ED Results / Procedures / Treatments  ? ?Labs ?(all labs ordered are listed, but only abnormal  results are displayed) ?Labs Reviewed  ?COMPREHENSIVE METABOLIC PANEL - Abnormal; Notable for the following components:  ?    Result Value  ? CO2 21 (*)   ? Glucose, Bld 218 (*)   ? Total Protein 6.0 (*)   ? Albumin 2.9 (*)   ? AST 43 (*)   ? All other components within normal limits  ?CBC - Abnormal; Notable for the following components:  ? RBC 2.93 (*)   ? Hemoglobin 8.0 (*)   ? HCT 27.2 (*)   ? MCHC 29.4 (*)   ? RDW 21.2 (*)   ? All other components within normal limits  ?URINALYSIS, ROUTINE W REFLEX MICROSCOPIC - Abnormal; Notable for the following components:  ? Color, Urine YELLOW (*)   ? APPearance CLEAR (*)   ? Specific Gravity, Urine >1.046 (*)   ? Ketones, ur 5 (*)   ? Leukocytes,Ua MODERATE (*)   ? All other components within normal limits  ?D-DIMER, QUANTITATIVE - Abnormal; Notable for the following components:  ? D-Dimer, Quant 3.33 (*)   ? All other  components within normal limits  ?LIPASE, BLOOD  ?PROTIME-INR  ?APTT  ? ? ?EKG ? ? ?RADIOLOGY ?CTA chest reviewed by me with evidence of acute PE. ? ?Official radiology report(s): ?CT Angio Chest PE W and/or Wo Contrast ? ?Result Date: 03/01/2022 ?CLINICAL DATA:  Emesis and fall EXAM: CT ANGIOGRAPHY CHEST WITH CONTRAST TECHNIQUE: Multidetector CT imaging of the chest was performed using the standard protocol during bolus administration of intravenous contrast. Multiplanar CT image reconstructions and MIPs were obtained to evaluate the vascular anatomy. RADIATION DOSE REDUCTION: This exam was performed according to the departmental dose-optimization program which includes automated exposure control, adjustment of the mA and/or kV according to patient size and/or use of iterative reconstruction technique. CONTRAST:  25mL OMNIPAQUE IOHEXOL 350 MG/ML SOLN COMPARISON:  CT 03/01/2022, CT chest 12/26/2021 FINDINGS: Cardiovascular: Satisfactory opacification of the pulmonary arteries to the segmental level. Positive for acute pulmonary emboli within the descending  right pulmonary artery with extension of thrombus into right lower lobe segmental and subsegmental vessels. Small amount of subsegmental embolus within the left lower lobe as well, series 5 image 189 through 191. RV LV ratio is 1.1. Aorta is nonaneurysmal. No dissection. Mild atherosclerosis. Coronary vascular calcification. Normal cardiac size. No pericardial effusion Mediastinum/Nodes: Midline trachea. No thyroid mass. No suspicious lymph nodes. Esophagus within normal limits. Lungs/Pleura: Lungs are clear. No pleural effusion or pneumothorax. Upper Abdomen: Liver cirrhosis Musculoskeletal: No acute osseous abnormality. Subacute to chronic left third fourth fifth sixth anterior rib fractures. Subacute to chronic left eleventh posteromedial rib fracture Review of the MIP images confirms the above findings. IMPRESSION: 1. Positive for acute right greater than left bilateral pulmonary emboli. Positive for acute PE with CT evidence of right heart strain (RV/LV Ratio = 1.1) consistent with at least submassive (intermediate risk) PE. The presence of right heart strain has been associated with an increased risk of morbidity and mortality. Please refer to the "PE Focused" order set in EPIC. 2. Clear lung fields 3. Hepatic cirrhosis Critical Value/emergent results were called by telephone at the time of interpretation on 03/01/2022 at 8:52 pm to provider [facility] , who verbally acknowledged these results. Aortic Atherosclerosis (ICD10-I70.0). Electronically Signed   By: Donavan Foil M.D.   On: 03/01/2022 20:52  ? ?CT ABDOMEN PELVIS W CONTRAST ? ?Result Date: 03/01/2022 ?CLINICAL DATA:  Postprandial emesis, fell yesterday, lower abdominal pain EXAM: CT ABDOMEN AND PELVIS WITH CONTRAST TECHNIQUE: Multidetector CT imaging of the abdomen and pelvis was performed using the standard protocol following bolus administration of intravenous contrast. RADIATION DOSE REDUCTION: This exam was performed according to the departmental  dose-optimization program which includes automated exposure control, adjustment of the mA and/or kV according to patient size and/or use of iterative reconstruction technique. CONTRAST:  146mL OMNIPAQUE IOHEXOL 300 MG/ML  SOLN COMPARISON:  12/26/2021, 02/20/2022 FINDINGS: Lower chest: Evaluation of the lung bases is somewhat limited by respiratory motion. I cannot exclude incidental right lower lobe pulmonary emboli given asymmetry in attenuation of the right lower lobe versus left lower lobe pulmonary vasculature. If pulmonary embolus is a clinical concern, dedicated CT pulmonary angiography may be useful. No acute airspace disease, effusion, or pneumothorax. Hepatobiliary: Stable findings of cirrhosis. No focal parenchymal liver abnormality or intrahepatic duct dilation. The gallbladder is unremarkable. Pancreas: Unremarkable. No pancreatic ductal dilatation or surrounding inflammatory changes. Spleen: Stable splenomegaly as a sequela of portal venous hypertension. No focal parenchymal abnormalities. Adrenals/Urinary Tract: Stable right renal cyst. Otherwise the kidneys enhance normally and symmetrically. No urinary tract calculi  or obstructive uropathy. The adrenals and bladder are unremarkable. Stomach/Bowel: No bowel obstruction or ileus. There is mild residual mural thickening of the cecum, with persistent but decreased pericecal fat stranding. Mild retained stool throughout the colon. Scattered diverticulosis of the sigmoid colon without diverticulitis. Vascular/Lymphatic: Aortic atherosclerosis. 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 No enlarged abdominal or pelvic lymph nodes. Reproductive: Status post hysterectomy. No adnexal masses. Other: No free fluid or free gas.  No abdominal wall hernia. Musculoskeletal: Stable compression fracture superior endplate of L3. No acute bony abnormalities. Reconstructed images demonstrate no  additional findings. IMPRESSION: 1. Possible incidental right lower lobe pulmonary emboli. Evaluation somewhat limited by respiratory motion at the lung bases. If pulmonary emboli are a clinical concern, CT pu

## 2022-03-01 NOTE — Assessment & Plan Note (Addendum)
Continue home medicines. °

## 2022-03-01 NOTE — Consult Note (Signed)
ANTICOAGULATION CONSULT NOTE - Initial Consult ? ?Pharmacy Consult for heparin infusion ?Indication: pulmonary embolus ? ?No Known Allergies ? ?Patient Measurements: ?Height: 5\' 2"  (157.5 cm) ?Weight: 75.8 kg (167 lb) ?IBW/kg (Calculated) : 50.1 ?Heparin Dosing Weight: 66.6 kg  ? ?Vital Signs: ?Temp: 98.7 ?F (37.1 ?C) (03/23 1446) ?Temp Source: Oral (03/23 1446) ?BP: 118/59 (03/23 2000) ?Pulse Rate: 109 (03/23 2000) ? ?Labs: ?Recent Labs  ?  03/01/22 ?1451  ?HGB 8.0*  ?HCT 27.2*  ?PLT 174  ?CREATININE 0.64  ? ? ?Estimated Creatinine Clearance: 53.5 mL/min (by C-G formula based on SCr of 0.64 mg/dL). ? ? ?Medical History: ?Past Medical History:  ?Diagnosis Date  ? Anemia   ? Arthritis   ? Broken shoulder   ? left humerus   ? COVID-19   ? Depression   ? Diabetes mellitus without complication (HCC)   ? Goiter   ? Hyperlipidemia   ? IDA (iron deficiency anemia) 01/11/2022  ? Overactive bladder   ? Parkinson's disease (HCC)   ? Pneumonia   ? Right arm weakness   ? limited movement  ? Unsteady gait   ? ? ?Medications:  ?No prior anticoagulation identified on chart review  ? ?Assessment: ?81 y.o. female who presents to the ED for evaluation of recurrent emesis and falls. Chest CT revealed acute right greater than left bilateral pulmonary emboli w/ RV/LV ratio = 1.1. Pharmacy consulted for initiation and management of heparin infusion ? ?Goal of Therapy:  ?Heparin level 0.3-0.7 units/ml ?Monitor platelets by anticoagulation protocol: Yes ?  ?Plan:  ?Give 4000 units bolus x 1 ?Start heparin infusion at 1150 units/hr ?Check anti-Xa level in 8 hours and daily while on heparin ?Continue to monitor H&H and platelets ? ?96, PharmD, BCPS ?Clinical Pharmacist   ?03/01/2022,9:03 PM ? ? ?

## 2022-03-01 NOTE — ED Triage Notes (Signed)
FIRST NURSE NOTE ?Pt via EMS from home. Pt have hematemesis that started around 1:00pm. Per EMS, pt had a bright blood clot in her vomit. Pt has hx of lumbar fracture, pt was seen last night . ?CBG 267  ?116 HR  ?123/72  ?96% on RA ?98 orally  ?20G L AC, EMS gave 4mg  of Zofran ?

## 2022-03-01 NOTE — Assessment & Plan Note (Addendum)
Resume home regimen. 

## 2022-03-01 NOTE — H&P (Signed)
?History and Physical  ? ? ?Patient: Whitney [name] MRN: [id number] DOA: 03/01/2022 ? ?Date of Service: the patient was seen and examined on 03/02/2022 ? ?Patient coming from: Home via EMS ? ?Chief Complaint:  ?Chief Complaint  ?Patient presents with  ? Emesis  ? Fall  ? ? ?HPI:  ? 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 ? ?Patient explains that for the past several months she has noted that she has become progressively more short of breath.  Shortness of breath was initially mild in intensity but progressively has become more more severe.  Shortness breath is worse with exertion and improved with rest. ? ?Patient explains that symptoms progressed until yesterday evening while she was playing in her scooter she tripped and fell.  Patient denies any associated focal weakness or loss of consciousness.  Since then, the patient complains of ongoing nausea and several episodes of vomiting.  Vomitus has mostly been nonbloody except for on 1 occasion when she reports throwing up "a blood clot."  Of note, patient denies chest pain or shortness of breath. ? ?Due to patient's persisting severe nausea with associated weakness and intermittent vomiting EMS was contacted who promptly came to evaluate the patient and brought her into [facility] emergency department for evaluation. ? ?Upon evaluation in the emergency department, initial work-up involved CT imaging of the abdomen and pelvis.  While there was no definitive acute process noted in the abdomen there seem to be an incidental right lower lobe pulmonary embolism.  A CT angiogram of the chest was then  obtained revealing bilateral pulmonary emboli with evidence of right heart strain.  Patient was initiated on a heparin infusion and the hospitalist group was then called to assess the patient for admission in the hospital. ? ?Review of Systems: Review of Systems  ?Constitutional:  Positive for malaise/fatigue.  ?Gastrointestinal:  Positive for nausea and vomiting.  ?Neurological:  Positive for weakness.  ?All other systems reviewed and are negative. ? ? ?Past Medical History:  ?Diagnosis Date  ? Anemia   ? Arthritis   ? Broken shoulder   ? left humerus   ? COVID-19   ? Depression   ? Diabetes mellitus without complication ([place])   ? Goiter   ? Hyperlipidemia   ? IDA (iron deficiency anemia) 01/11/2022  ? Overactive bladder   ? Parkinson's disease ([place])   ? Pneumonia   ? Right arm weakness   ? limited movement  ? Unsteady gait   ? ? ?Past Surgical History:  ?Procedure Laterality Date  ? ABDOMINAL HYSTERECTOMY  1980  ? BLADDER REPAIR  1980  ? post hysterectomy  ? ESOPHAGOGASTRODUODENOSCOPY N/A 12/27/2021  ? Procedure: ESOPHAGOGASTRODUODENOSCOPY (EGD);  Surgeon: Toledo, Benay Pike, MD;  Location: [facility] ENDOSCOPY;  Service: Gastroenterology;  Laterality: N/A;  ? NECK SURGERY    ? anterior cervical disc fusion  ? THYROIDECTOMY Left 08/03/2020  ? Procedure: THYROIDECTOMY-HEMI;  Surgeon: Clyde Canterbury, MD;  Location: [facility] ORS;  Service: ENT;  Laterality: Left;  ? ? ?Social History:  reports that she quit smoking about 3 years ago. Her smoking use included cigarettes. She has a 80.00 pack-year smoking history. She has never used smokeless tobacco. She reports that she does not currently use  alcohol. She reports that she does not use drugs. ? ?No Known Allergies ? ?Family History  ?Problem Relation Age of Onset  ? Stroke Mother   ? Hypertension Mother   ? Diabetes Father   ? Heart disease Father   ? Lung cancer Father   ? Colon cancer Sister   ? Diabetes Sister   ? Kidney disease Daughter   ? Kidney cancer Daughter   ? Pulmonary  embolism Sister   ? Diabetes Sister   ? Kidney disease Sister   ? Heart disease Sister   ? Atrial fibrillation Sister   ? Diabetes Brother   ? Heart disease Brother   ? AAA (abdominal aortic aneurysm) Brother   ? Cancer Brother   ?     skin  ? ? ?Prior to Admission medications   ?Medication Sig Start Date End Date Taking? Authorizing Provider  ?acetaminophen (TYLENOL) 500 MG tablet Take 500-1,000 mg by mouth every 6 (six) hours as needed (for pain.).   Yes [provider]  ?amantadine (SYMMETREL) 100 MG capsule Take 100 mg by mouth 2 (two) times daily. 12/05/21  Yes [provider]  ?buPROPion (WELLBUTRIN XL) 150 MG 24 hr tablet TAKE 1 TABLET(150 MG) BY MOUTH DAILY 04/01/20  Yes Danelle Berry, PA-C  ?cholecalciferol (VITAMIN D3) 25 MCG (1000 UNIT) tablet Take 1,000 Units by mouth daily.    Yes [provider]  ?ferrous sulfate 325 (65 FE) MG tablet Take 325 mg by mouth daily with breakfast.    Yes [provider]  ?furosemide (LASIX) 40 MG tablet Take 40 mg by mouth daily as needed. 01/12/22  Yes [provider]  ?HYDROcodone-acetaminophen (NORCO/VICODIN) 5-325 MG tablet Take 1 tablet by mouth every 6 (six) hours as needed for moderate pain. 02/09/22  Yes Fisher, Roselyn Bering, PA-C  ?lactulose (CHRONULAC) 10 GM/15ML solution Take 15 mLs (10 g total) by mouth daily. 12/31/21  Yes Wieting, Richard, MD  ?lidocaine (LIDODERM) 5 % Place 1 patch onto the skin every 12 (twelve) hours. Remove & Discard patch within 12 hours or as directed by MD 02/14/22 02/14/23 Yes Delton Prairie, MD  ?metFORMIN (GLUCOPHAGE-XR) 500 MG 24 hr tablet Take 1 tablet (500 mg total) by mouth 2 (two) times daily with a meal. TAKE 1 TABLET(500 MG) BY MOUTH BID ?Patient taking differently: Take 500 mg by mouth 2 (two) times daily. TAKE 1 TABLET(500 MG) BY MOUTH BID 09/01/20  Yes Danelle Berry, PA-C  ?methocarbamol (ROBAXIN) 500 MG tablet Take 1 tablet (500 mg total) by mouth every 8 (eight) hours as needed for muscle spasms.  02/14/22  Yes Delton Prairie, MD  ?MYRBETRIQ 50 MG TB24 tablet TAKE 1 TABLET(50 MG) BY MOUTH DAILY 07/20/21  Yes Carman Ching, PA-C  ?Trospium Chloride 60 MG CP24 TAKE 1 CAPSULE(60 MG) BY MOUTH DAILY 09/19/21  Yes Vaillancourt, Samantha, PA-C  ?vitamin B-12 (CYANOCOBALAMIN) 500 MCG tablet Take 500 mcg by mouth daily.   Yes [provider]  ?pantoprazole (PROTONIX) 40 MG tablet Take 1 tablet (40 mg total) by mouth daily. ?Patient not taking: Reported on 03/01/2022 12/31/21   Alford Highland, MD  ? ? ?Physical Exam: ? ?Vitals:  ? 03/01/22 2115 03/01/22 2225 03/02/22 0007 03/02/22 0424  ?BP: 135/75 140/79 133/65 127/72  ?Pulse: (!) 113 (!) 111 (!) 109 (!) 109  ?Resp: 15 18 18 18   ?Temp:  98.3 ?F (36.8 ?C) 99.4 ?F (37.4 ?C) 98.4 ?F (36.9 ?C)  ?TempSrc:      ?SpO2: 96% 97% 95% 95%  ?  Weight:      ?Height:      ? ? ?Constitutional: Awake alert and oriented x3, no associated distress.   ?Skin: Notable significant hyperemia of the distal bilateral lower extremities.  Poor skin turgor noted.  No rashes, no lesions ?Eyes: Pupils are equally reactive to light.  No evidence of scleral icterus or conjunctival pallor.  ?ENMT: Moist mucous membranes noted.  Posterior pharynx clear of any exudate or lesions.   ?Neck: normal, supple, no masses, no thyromegaly.  No evidence of jugular venous distension.   ?Respiratory: Scattered rhonchi bilaterally without any evidence of wheezing or rales.  Normal respiratory effort. No accessory muscle use.  ?Cardiovascular: Tachycardic rate with regular rhythm, no murmurs / rubs / gallops.  +1 distal bilateral lower extremity pitting edema.  2+ pedal pulses. No carotid bruits.  ?Chest:   Nontender without crepitus or deformity.   ?Back:   Nontender without crepitus or deformity. ?Abdomen: Abdomen is soft and nontender.  No evidence of intra-abdominal masses.  Positive bowel sounds noted in all quadrants.   ?Musculoskeletal: No joint deformity upper and lower extremities. Good ROM, no  contractures. Normal muscle tone.  ?Neurologic: CN 2-12 grossly intact. Sensation intact.  Patient moving all 4 extremities spontaneously.  Patient is following all commands.  Patient is responsive to verb

## 2022-03-01 NOTE — Assessment & Plan Note (Addendum)
Patient initially treated with heparin drip, changed to Eliquis. ?Due to unprovoked and recurrent VTE, anticoagulation should be continued indefinitely. ?

## 2022-03-01 NOTE — ED Triage Notes (Signed)
Pt here via [facility] for emesis and a fall last night. Pt was plugging up her scooter when she lost her balance but denies hitting her head. Pt has been having N/V since this morning. Pt stable in triage. ?

## 2022-03-02 ENCOUNTER — Inpatient Hospital Stay: Payer: Medicare PPO

## 2022-03-02 ENCOUNTER — Inpatient Hospital Stay
Admit: 2022-03-02 | Discharge: 2022-03-02 | Disposition: A | Discharge status: Home / Self Care | Payer: Medicare PPO | Attending: Internal Medicine | Admitting: Internal Medicine

## 2022-03-02 DIAGNOSIS — D649 Anemia, unspecified: Secondary | ICD-10-CM | POA: Diagnosis not present

## 2022-03-02 DIAGNOSIS — K7469 Other cirrhosis of liver: Secondary | ICD-10-CM | POA: Diagnosis not present

## 2022-03-02 DIAGNOSIS — I2699 Other pulmonary embolism without acute cor pulmonale: Secondary | ICD-10-CM | POA: Diagnosis not present

## 2022-03-02 DIAGNOSIS — K639 Disease of intestine, unspecified: Secondary | ICD-10-CM | POA: Diagnosis not present

## 2022-03-02 DIAGNOSIS — D693 Immune thrombocytopenic purpura: Secondary | ICD-10-CM

## 2022-03-02 LAB — CBC WITH DIFFERENTIAL/PLATELET
Abs Immature Granulocytes: 0.02 10*3/uL (ref 0.00–0.07)
Basophils Absolute: 0 10*3/uL (ref 0.0–0.1)
Basophils Relative: 0 %
Eosinophils Absolute: 0.1 10*3/uL (ref 0.0–0.5)
Eosinophils Relative: 1 %
HCT: 22.5 % — ABNORMAL LOW (ref 36.0–46.0)
Hemoglobin: 6.9 g/dL — ABNORMAL LOW (ref 12.0–15.0)
Immature Granulocytes: 1 %
Lymphocytes Relative: 18 %
Lymphs Abs: 0.8 10*3/uL (ref 0.7–4.0)
MCH: 28 pg (ref 26.0–34.0)
MCHC: 30.7 g/dL (ref 30.0–36.0)
MCV: 91.5 fL (ref 80.0–100.0)
Monocytes Absolute: 0.5 10*3/uL (ref 0.1–1.0)
Monocytes Relative: 11 %
Neutro Abs: 2.9 10*3/uL (ref 1.7–7.7)
Neutrophils Relative %: 69 %
Platelets: 128 10*3/uL — ABNORMAL LOW (ref 150–400)
RBC: 2.46 MIL/uL — ABNORMAL LOW (ref 3.87–5.11)
RDW: 21 % — ABNORMAL HIGH (ref 11.5–15.5)
WBC: 4.2 10*3/uL (ref 4.0–10.5)
nRBC: 0 % (ref 0.0–0.2)

## 2022-03-02 LAB — COMPREHENSIVE METABOLIC PANEL
ALT: 22 U/L (ref 0–44)
AST: 29 U/L (ref 15–41)
Albumin: 2.6 g/dL — ABNORMAL LOW (ref 3.5–5.0)
Alkaline Phosphatase: 118 U/L (ref 38–126)
Anion gap: 5 (ref 5–15)
BUN: 19 mg/dL (ref 8–23)
CO2: 27 mmol/L (ref 22–32)
Calcium: 8.2 mg/dL — ABNORMAL LOW (ref 8.9–10.3)
Chloride: 106 mmol/L (ref 98–111)
Creatinine, Ser: 0.48 mg/dL (ref 0.44–1.00)
GFR, Estimated: >60 mL/min (ref >60)
Glucose, Bld: 201 mg/dL — ABNORMAL HIGH (ref 70–99)
Potassium: 4 mmol/L (ref 3.5–5.1)
Sodium: 138 mmol/L (ref 135–145)
Total Bilirubin: 0.7 mg/dL (ref 0.3–1.2)
Total Protein: 5.6 g/dL — ABNORMAL LOW (ref 6.5–8.1)

## 2022-03-02 LAB — HEMOGLOBIN A1C
Hgb A1c MFr Bld: 5.5 % (ref 4.8–5.6)
Mean Plasma Glucose: 111.15 mg/dL

## 2022-03-02 LAB — HEPARIN LEVEL (UNFRACTIONATED)
Heparin Unfractionated: 0.34 IU/mL (ref 0.30–0.70)
Heparin Unfractionated: 0.49 IU/mL (ref 0.30–0.70)

## 2022-03-02 LAB — GLUCOSE, CAPILLARY
Glucose-Capillary: 158 mg/dL — ABNORMAL HIGH (ref 70–99)
Glucose-Capillary: 153 mg/dL — ABNORMAL HIGH (ref 70–99)
Glucose-Capillary: 172 mg/dL — ABNORMAL HIGH (ref 70–99)
Glucose-Capillary: 183 mg/dL — ABNORMAL HIGH (ref 70–99)

## 2022-03-02 LAB — HEMOGLOBIN
Hemoglobin: 7.2 g/dL — ABNORMAL LOW (ref 12.0–15.0)
Hemoglobin: 8.8 g/dL — ABNORMAL LOW (ref 12.0–15.0)

## 2022-03-02 LAB — PREPARE RBC (CROSSMATCH)

## 2022-03-02 LAB — MAGNESIUM: Magnesium: 1.6 mg/dL — ABNORMAL LOW (ref 1.7–2.4)

## 2022-03-02 MED ORDER — MAGNESIUM SULFATE 2 GM/50ML IV SOLN
2.0000 g | Freq: Once | INTRAVENOUS | Status: AC
  Administered 2022-03-02: 2 g via INTRAVENOUS
  Filled 2022-03-02: qty 50

## 2022-03-02 MED ORDER — PANTOPRAZOLE SODIUM 40 MG PO TBEC
40.0000 mg | DELAYED_RELEASE_TABLET | Freq: Two times a day (BID) | ORAL | Status: DC
Start: 2022-03-02 — End: 2022-03-06
  Administered 2022-03-02 – 2022-03-06 (×8): 40 mg via ORAL
  Filled 2022-03-02 (×8): qty 1

## 2022-03-02 MED ORDER — SODIUM CHLORIDE 0.9% IV SOLUTION: Freq: Once | INTRAVENOUS | Status: AC

## 2022-03-02 MED ORDER — POLYETHYLENE GLYCOL 3350 17 G PO PACK
34.0000 g | PACK | Freq: Every day | ORAL | Status: DC
  Administered 2022-03-02 – 2022-03-05 (×4): 34 g via ORAL
  Filled 2022-03-02 (×5): qty 2

## 2022-03-02 MED ORDER — SODIUM CHLORIDE 0.9 % IV SOLN
300.0000 mg | Freq: Once | INTRAVENOUS | Status: AC
  Administered 2022-03-02: 300 mg via INTRAVENOUS
  Filled 2022-03-02: qty 300

## 2022-03-02 NOTE — Plan of Care (Signed)
  Problem: Education: Goal: Knowledge of General Education information will improve Description: Including pain rating scale, medication(s)/side effects and non-pharmacologic comfort measures Outcome: Progressing   Problem: Health Behavior/Discharge Planning: Goal: Ability to manage health-related needs will improve Outcome: Progressing   Problem: Clinical Measurements: Goal: Ability to maintain clinical measurements within normal limits will improve Outcome: Progressing   Problem: Clinical Measurements: Goal: Diagnostic test results will improve Outcome: Progressing   Problem: Clinical Measurements: Goal: Respiratory complications will improve Outcome: Progressing   

## 2022-03-02 NOTE — Progress Notes (Signed)
?   03/02/22 1650  ?Assess: MEWS Score  ?Temp 98 ?F (36.7 ?C)  ?BP 139/75  ?Pulse Rate (!) 112  ?SpO2 97 %  ?O2 Device Room Air  ?Assess: MEWS Score  ?MEWS Temp 0  ?MEWS Systolic 0  ?MEWS Pulse 2  ?MEWS RR 0  ?MEWS LOC 0  ?MEWS Score 2  ?MEWS Score Color Yellow  ?Assess: if the MEWS score is Yellow or Red  ?Were vital signs taken at a resting state? Yes  ?Focused Assessment No change from prior assessment  ?Does the patient meet 2 or more of the SIRS criteria? No  ?MEWS guidelines implemented *See Row Information* Yes  ?Treat  ?MEWS Interventions Administered scheduled meds/treatments  ?Pain Scale 0-10  ?Notify: Charge Nurse/RN  ?Name of Charge Nurse/RN Notified Bynum  ?Date Charge Nurse/RN Notified 03/02/22  ?Time Charge Nurse/RN Notified 1703  ?Notify: Provider  ?Provider Name/Title Zhang  ?Date Provider Notified 03/02/22  ?Time Provider Notified [phone]  ?Notification Type Page  ?Notification Reason Other (Comment)  ?Provider response No new orders  ?Date of Provider Response 03/02/22  ?Time of Provider Response 1704  ?Assess: SIRS CRITERIA  ?SIRS Temperature  0  ?SIRS Pulse 1  ?SIRS Respirations  0  ?SIRS WBC 0  ?SIRS Score Sum  1  ? ? ?

## 2022-03-02 NOTE — Assessment & Plan Note (Addendum)
Magnesium level normalized ?

## 2022-03-02 NOTE — Progress Notes (Signed)
OT Cancellation Note ? ?Patient Details ?Name: Whitney [name] ?MRN: [id number] ?DOB: 03-Nov-1941 ? ? ?Cancelled Treatment:    Reason Eval/Treat Not Completed: Medical issues which prohibited therapy. Order received and chart reviewed. Per conversation with RN, pt off the unit for ultrasound, pending blood transfusion afterwards, request to hold this AM. Will continue to follow to initiate services as appropriate.  ? ?Dessie Coma, M.S. OTR/L  ?03/02/22, 11:27 AM  ?ascom [phone] ? ?

## 2022-03-02 NOTE — Consult Note (Signed)
ANTICOAGULATION CONSULT NOTE - Initial Consult ? ?Pharmacy Consult for heparin infusion ?Indication: pulmonary embolus ? ?No Known Allergies ? ?Patient Measurements: ?Height: 5\' 2"  (157.5 cm) ?Weight: 75.8 kg (167 lb) ?IBW/kg (Calculated) : 50.1 ?Heparin Dosing Weight: 66.6 kg  ? ?Vital Signs: ?Temp: 98.5 ?F (36.9 ?C) (03/24 1724) ?Temp Source: Oral (03/24 1650) ?BP: 130/72 (03/24 1724) ?Pulse Rate: 108 (03/24 1724) ? ?Labs: ?Recent Labs  ?  03/01/22 ?1451 03/01/22 ?1935 03/02/22 ?[id number] 03/02/22 ?[id number] 03/02/22 ?1713  ?HGB 8.0*  --  6.9* 7.2* 8.8*  ?HCT 27.2*  --  22.5*  --   --   ?PLT 174  --  128*  --   --   ?APTT  --  29  --   --   --   ?LABPROT  --  16.5*  --   --   --   ?INR  --  1.3*  --   --   --   ?HEPARINUNFRC  --   --  0.34  --  0.49  ?CREATININE 0.64  --  0.48  --   --   ? ? ? ?Estimated Creatinine Clearance: 53.5 mL/min (by C-G formula based on SCr of 0.48 mg/dL). ? ? ?Medical History: ?Past Medical History:  ?Diagnosis Date  ? Anemia   ? Arthritis   ? Broken shoulder   ? left humerus   ? COVID-19   ? Depression   ? Diabetes mellitus without complication ([place])   ? Goiter   ? Hyperlipidemia   ? IDA (iron deficiency anemia) 01/11/2022  ? Overactive bladder   ? Parkinson's disease ([place])   ? Pneumonia   ? Right arm weakness   ? limited movement  ? Unsteady gait   ? ? ?Medications:  ?No prior anticoagulation identified on chart review  ? ?Assessment: ?81 y.o. female who presents to the ED for evaluation of recurrent emesis and falls. Chest CT revealed acute right greater than left bilateral pulmonary emboli w/ RV/LV ratio = 1.1. Pharmacy consulted for initiation and management of heparin infusion ? ?3/24 0625 HL 0.34  ?3/24 1713 HL 0.49 ? ?Goal of Therapy:  ?Heparin level 0.3-0.7 units/ml ?Monitor platelets by anticoagulation protocol: Yes ?  ?Plan:  ?Heparin level is therapeutic. Will continue heparin infusion at 1150 units/hr. Recheck heparin level with AM labs. CBC daily while on heparin.  ? ?Sherilyn Banker,  PharmD, BCPS ?Clinical Pharmacist   ?03/02/2022,5:41 PM ? ? ?

## 2022-03-02 NOTE — TOC Initial Note (Signed)
Transition of Care (TOC) - Initial/Assessment Note  ? ? ?Patient Details  ?Name: Whitney [name] ?MRN: [id number] ?Date of Birth: [birth year] ? ?Transition of Care (TOC) CM/SW Contact:    ?Belladonna Lubinski E Britton Perkinson, LCSW ?Phone Number: ?03/02/2022, 1:33 PM ? ?Clinical Narrative:           Spoke to patient for readmission screening. ?Patient lives alone. Daughter or sister provide transport. PCP is Dr. Judithann Sheen. Pharmacy is [facility]. Patient has a RW, cane, scooter, and wheelchair. Patient had [facility] in the past and said she would like to use them again if [facility] is recommended. PT and OT are pending. ?CSW called Adelina Mings with [facility] who confirmed they just closed patient's case on 3/21, she was receiving PT and OT. Informed Adelina Mings patient may need HH again, TOC to follow.      ? ? ?Expected Discharge Plan: Home w Home Health Services ?Barriers to Discharge: Continued Medical Work up ? ? ?Patient Goals and CMS Choice ?Patient states their goals for this hospitalization and ongoing recovery are:: home with home health ?CMS Medicare.gov Compare Post Acute Care list provided to:: Patient ?Choice offered to / list presented to : Patient ? ?Expected Discharge Plan and Services ?Expected Discharge Plan: Home w Home Health Services ?  ?  ?  ?Living arrangements for the past 2 months: Single Family Home ?                ?  ?  ?  ?  ?  ?  ?  ?  ?  ?  ? ?Prior Living Arrangements/Services ?Living arrangements for the past 2 months: Single Family Home ?Lives with:: Self ?Patient language and need for interpreter reviewed:: Yes ?Do you feel safe going back to the place where you live?: Yes      ?Need for Family Participation in Patient Care: Yes (Comment) ?Care giver support system in place?: Yes (comment) ?Current home services: DME ?Criminal Activity/Legal Involvement Pertinent to Current Situation/Hospitalization: No - Comment as needed ? ?Activities of Daily Living ?Home Assistive Devices/Equipment: Dan Humphreys (specify type),  Wheelchair, Electric scooter ?ADL Screening (condition at time of admission) ?Patient's cognitive ability adequate to safely complete daily activities?: No ?Is the patient deaf or have difficulty hearing?: Yes ?Does the patient have difficulty seeing, even when wearing glasses/contacts?: No ?Does the patient have difficulty concentrating, remembering, or making decisions?: No ?Patient able to express need for assistance with ADLs?: Yes ?Does the patient have difficulty dressing or bathing?: No ?Independently performs ADLs?: Yes (appropriate for developmental age) ?Does the patient have difficulty walking or climbing stairs?: Yes ?Weakness of Legs: None ?Weakness of Arms/Hands: None ? ?Permission Sought/Granted ?Permission sought to share information with : Facility Medical sales representative, Family Supports ?Permission granted to share information with : Yes, Verbal Permission Granted ?   ? Permission granted to share info w AGENCY: HH ?   ?   ? ?Emotional Assessment ?  ?  ?  ?Orientation: : Oriented to Self, Oriented to Place, Oriented to  Time, Oriented to Situation ?Alcohol / Substance Use: Not Applicable ?Psych Involvement: No (comment) ? ?Admission diagnosis:  Acute pulmonary embolism (HCC) [I26.99] ?Other acute pulmonary embolism with acute cor pulmonale (HCC) [I26.09] ?Patient Active Problem List  ? Diagnosis Date Noted  ? Chronic anemia 03/02/2022  ? 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 Hypomagnesemia 03/02/2022  ? Acute pulmonary embolism (HCC) 03/01/2022  ? Prolonged QT interval 03/01/2022  ? GERD without esophagitis 03/01/2022  ? Lesion of cecum  03/01/2022  ? IDA (iron deficiency anemia) 01/11/2022  ? Pancytopenia ([place])   ? Epigastric pain   ? Secondary esophageal varices without bleeding ([place])   ? Weakness   ? Melena 12/27/2021  ? Lactic acidosis   ? Drop in hemoglobin   ? Type 2 diabetes mellitus with hyperglycemia, without long-term current use of insulin ([place])   ? Depression 12/26/2021   ? Sepsis ([place]) 12/26/2021  ? Iron deficiency anemia 12/26/2021  ? Hepatic cirrhosis ([place]) 12/26/2021  ? Slurred speech 12/26/2021  ? Pyuria 12/26/2021  ? Thyroid goiter 08/03/2020  ? Impaired mobility and ADLs 04/15/2020  ? Vitamin D deficiency 07/20/2019  ? 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[place]) 07/20/2019  ? At high risk for falls 07/20/2019  ? Parkinson's disease ([place]) 01/15/2019  ? Hyperlipidemia 01/15/2019  ? Mixed diabetic hyperlipidemia associated with type 2 diabetes mellitus ([place]) 01/13/2019  ? Overactive bladder 01/13/2019  ? Obesity (BMI 30.0-34.9) 01/13/2019  ? ?PCP:  Idelle Crouch, MD ?Pharmacy:   ?[facility] [place], [place] AT [facility] OF [address] ?[place] ?[place] [address] ?Phone: [phone] Fax: [phone] ? ?[facility] [place] [address] ?[place] ?[place] [address] ?Phone: [phone] Fax: [phone] ? ? ? ? ?Social Determinants of Health (SDOH) Interventions ?  ? ?Readmission Risk Interventions ? ?  03/02/2022  ?  1:31 PM  ?Readmission Risk Prevention Plan  ?Transportation Screening Complete  ?PCP or Specialist Appt within 3-5 Days Complete  ?[place] or Home Care Consult Complete  ?Social Work Consult for [place] Planning/Counseling Complete  ?Palliative Care Screening Not Applicable  ?Medication Review Press photographer) Complete  ? ? ? ?

## 2022-03-02 NOTE — Assessment & Plan Note (Addendum)
Patient received 1 unit of PRBC and IV iron.  Resume home dose of B12 and iron treatment. ?

## 2022-03-02 NOTE — Hospital Course (Addendum)
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  Patient also has been having short of breath for the last few months. ?Her CT chest showed bilateral PE with right heart strain.  Duplex ultrasound showed a right lower extremity superficial thrombosis. ?Patient is treated with IV heparin. ?3/26.  Echocardiogram did not show any right heart strain ejection fraction 65 to 70%.  Anticoagulation changed to Eliquis.  Patient need nursing placement per physical therapy. ?

## 2022-03-02 NOTE — Assessment & Plan Note (Addendum)
Condition stable/improving ?

## 2022-03-02 NOTE — Consult Note (Signed)
? ? ? ?Arlyss Repress, MD ?[address]  ?[address]  ?[place], [place] [address]  ?Main: [phone]  ?Fax: [phone] ?Pager: [phone] ? ? Consultation ? ?Referring Provider:     No ref. provider found ?Primary Care Physician:  Marguarite Arbour, MD ?Primary Gastroenterologist:  Dr. Maximino Greenland ?Reason for Consultation:     ?  Colon mass ? ?Date of Admission:  03/01/2022 ?Date of Consultation:  03/02/2022 ?       ? HPI:   ?Whitney [name] is a 81 y.o. female with history of cirrhosis, iron deficiency anemia, Parkinson's disease, diabetes, depression, cirrhosis of liver, pulmonary hypertension is admitted with nausea, vomiting after a fall last night.  Patient reports progressively worsening shortness of breath, generalized weakness.  Patient reports that she had several episodes of vomiting which were nonbloody, except 1 episode where she was throwing up a blood clot.  Patient underwent CT of her abdomen and pelvis which incidentally revealed right lower lobe pulmonary embolism, subsequently underwent CT angio PE protocol which revealed bilateral pulmonary emboli with evidence of right heart strain.  Patient is currently on heparin infusion.  GI is consulted to evaluate for abnormal CT abdomen which showed mild residual mural thickening of the cecum with persistent but decreased pericecal fat stranding, mild retained stool throughout the colon, scattered diverticulosis of the sigmoid colon. ? ?Patient was admitted to [facility] in January 2023 secondary to melena, acute blood loss anemia, underwent EGD which revealed large esophageal varices, portal hypertensive gastropathy and hiatal hernia, banding was not performed at that time. ? ?Patient's hemoglobin on admission was 8, dropped to 6.9 today.  It was 9.5 on 02/09/2022, platelets 128, PT/INR 16.5/1.3, BUN/creatinine 19/0.48 ?Patient reports feeling weak, currently receiving IV iron.  She also states that her sister had colon cancer in her 15s and found to have  recurrence about a week ago. ? ?NSAIDs: None ? ?Antiplts/Anticoagulants/Anti thrombotics: None ? ?GI Procedures:  ?Reported history of undergoing colonoscopy about 5 years ago in [place], [place] and reportedly it was normal.  Has had other colonoscopies prior to that as well, according to the patient, which were normal.  None of her previous procedure reports are available to [place]. ? ?Upper endoscopy 12/27/2021 ?- Grade II esophageal varices. ?- Gastritis. ?- Portal hypertensive gastropathy. ?- Normal examined duodenum. ?- 1 cm hiatal hernia. ?- The examination was otherwise normal. ?- No specimens collected. ? ?Past Medical History:  ?Diagnosis Date  ? Anemia   ? Arthritis   ? Broken shoulder   ? left humerus   ? COVID-19   ? Depression   ? Diabetes mellitus without complication (HCC)   ? Goiter   ? Hyperlipidemia   ? IDA (iron deficiency anemia) 01/11/2022  ? Overactive bladder   ? Parkinson's disease (HCC)   ? Pneumonia   ? Right arm weakness   ? limited movement  ? Unsteady gait   ? ? ?Past Surgical History:  ?Procedure Laterality Date  ? ABDOMINAL HYSTERECTOMY  1980  ? BLADDER REPAIR  1980  ? post hysterectomy  ? ESOPHAGOGASTRODUODENOSCOPY N/A 12/27/2021  ? Procedure: ESOPHAGOGASTRODUODENOSCOPY (EGD);  Surgeon: Toledo, Boykin Nearing, MD;  Location: [facility] ENDOSCOPY;  Service: Gastroenterology;  Laterality: N/A;  ? NECK SURGERY    ? anterior cervical disc fusion  ? THYROIDECTOMY Left 08/03/2020  ? Procedure: THYROIDECTOMY-HEMI;  Surgeon: Geanie Logan, MD;  Location: [facility] ORS;  Service: ENT;  Laterality: Left;  ? ? ?Prior to Admission medications   ?Medication Sig Start  Date End Date Taking? Authorizing Provider  ?acetaminophen (TYLENOL) 500 MG tablet Take 500-1,000 mg by mouth every 6 (six) hours as needed (for pain.).   Yes [provider]  ?amantadine (SYMMETREL) 100 MG capsule Take 100 mg by mouth 2 (two) times daily. 12/05/21  Yes [provider]  ?buPROPion (WELLBUTRIN XL) 150 MG 24 hr tablet  TAKE 1 TABLET(150 MG) BY MOUTH DAILY 04/01/20  Yes Delsa Grana, PA-C  ?cholecalciferol (VITAMIN D3) 25 MCG (1000 UNIT) tablet Take 1,000 Units by mouth daily.    Yes [provider]  ?ferrous sulfate 325 (65 FE) MG tablet Take 325 mg by mouth daily with breakfast.    Yes [provider]  ?furosemide (LASIX) 40 MG tablet Take 40 mg by mouth daily as needed. 01/12/22  Yes [provider]  ?HYDROcodone-acetaminophen (NORCO/VICODIN) 5-325 MG tablet Take 1 tablet by mouth every 6 (six) hours as needed for moderate pain. 02/09/22  Yes Fisher, Linden Dolin, PA-C  ?lactulose (CHRONULAC) 10 GM/15ML solution Take 15 mLs (10 g total) by mouth daily. 12/31/21  Yes Wieting, Richard, MD  ?lidocaine (LIDODERM) 5 % Place 1 patch onto the skin every 12 (twelve) hours. Remove & Discard patch within 12 hours or as directed by MD 02/14/22 02/14/23 Yes Vladimir Crofts, MD  ?metFORMIN (GLUCOPHAGE-XR) 500 MG 24 hr tablet Take 1 tablet (500 mg total) by mouth 2 (two) times daily with a meal. TAKE 1 TABLET(500 MG) BY MOUTH BID ?Patient taking differently: Take 500 mg by mouth 2 (two) times daily. TAKE 1 TABLET(500 MG) BY MOUTH BID 09/01/20  Yes Delsa Grana, PA-C  ?methocarbamol (ROBAXIN) 500 MG tablet Take 1 tablet (500 mg total) by mouth every 8 (eight) hours as needed for muscle spasms. 02/14/22  Yes Vladimir Crofts, MD  ?MYRBETRIQ 50 MG TB24 tablet TAKE 1 TABLET(50 MG) BY MOUTH DAILY 07/20/21  Yes Debroah Loop, PA-C  ?Trospium Chloride 60 MG CP24 TAKE 1 CAPSULE(60 MG) BY MOUTH DAILY 09/19/21  Yes Vaillancourt, Samantha, PA-C  ?vitamin B-12 (CYANOCOBALAMIN) 500 MCG tablet Take 500 mcg by mouth daily.   Yes [provider]  ?pantoprazole (PROTONIX) 40 MG tablet Take 1 tablet (40 mg total) by mouth daily. ?Patient not taking: Reported on 03/01/2022 12/31/21   Loletha Grayer, MD  ? ?Current Facility-Administered Medications:  ?  acetaminophen (TYLENOL) tablet 650 mg, 650 mg, Oral, Q6H PRN **OR** acetaminophen (TYLENOL)  suppository 650 mg, 650 mg, Rectal, Q6H PRN, Shalhoub, Sherryll Burger, MD ?  amantadine (SYMMETREL) capsule 100 mg, 100 mg, Oral, BID, Shalhoub, Sherryll Burger, MD, 100 mg at 03/02/22 [id number] ?  buPROPion (WELLBUTRIN XL) 24 hr tablet 150 mg, 150 mg, Oral, Daily, Shalhoub, Sherryll Burger, MD, 150 mg at 03/02/22 [id number] ?  cholecalciferol (VITAMIN D3) tablet 1,000 Units, 1,000 Units, Oral, Daily, Shalhoub, Sherryll Burger, MD, 1,000 Units at 03/02/22 [phone] ?  ferrous sulfate tablet 325 mg, 325 mg, Oral, Q breakfast, Sharen Hones, MD, 325 mg at 03/02/22 [id number] ?  heparin ADULT infusion 100 units/mL (25000 units/235mL), 1,150 Units/hr, Intravenous, Continuous, Shalhoub, Sherryll Burger, MD, Last Rate: 11.5 mL/hr at 03/01/22 2117, 1,150 Units/hr at 03/01/22 2117 ?  insulin aspart (novoLOG) injection 0-15 Units, 0-15 Units, Subcutaneous, TID AC & HS, Shalhoub, Sherryll Burger, MD, 3 Units at 03/02/22 [phone] ?  lactulose (CHRONULAC) 10 GM/15ML solution 10 g, 10 g, Oral, Daily, Shalhoub, Sherryll Burger, MD, 10 g at 03/02/22 [id number] ?  methocarbamol (ROBAXIN) tablet 500 mg, 500 mg, Oral, Q8H PRN, Shalhoub, Sherryll Burger, MD ?  pantoprazole (PROTONIX) EC tablet 40 mg, 40 mg, Oral, BID AC, Sharen Hones, MD ?  polyethylene glycol (MIRALAX / GLYCOLAX) packet 34 g, 34 g, Oral, Daily, Brindle Leyba, Tally Due, MD, 34 g at 03/02/22 0919 ?  trospium (SANCTURA) tablet 20 mg, 20 mg, Oral, BID, Shalhoub, Sherryll Burger, MD, 20 mg at 03/02/22 [id number] ? ? ?Family History  ?Problem Relation Age of Onset  ? Stroke Mother   ? Hypertension Mother   ? Diabetes Father   ? Heart disease Father   ? Lung cancer Father   ? Colon cancer Sister   ? Diabetes Sister   ? Kidney disease Daughter   ? Kidney cancer Daughter   ? Pulmonary embolism Sister   ? Diabetes Sister   ? Kidney disease Sister   ? Heart disease Sister   ? Atrial fibrillation Sister   ? Diabetes Brother   ? Heart disease Brother   ? AAA (abdominal aortic aneurysm) Brother   ? Cancer Brother   ?     skin  ?  ? ?Social History  ? ?Tobacco Use  ? Smoking status:  Former  ?  Packs/day: 2.00  ?  Years: 40.00  ?  Pack years: 80.00  ?  Types: Cigarettes  ?  Quit date: 12/10/2018  ?  Years since quitting: 3.2  ? Smokeless tobacco: Never  ?Vaping Use  ? Vaping Use: Nev

## 2022-03-02 NOTE — Progress Notes (Signed)
?Progress Note ? ? ?Patient: Whitney [name] [id number] DOB: 09-Mar-1941 DOA: 03/01/2022     1 ?DOS: the patient was seen and examined on 03/02/2022 ?  ?Brief hospital course: ?81 year old female with past medical history of Parkinson disease, non-insulin-dependent diabetes mellitus type 2, major depressive disorder, hepatic cirrhosis (complicated by portal hypertensive gastropathy and grade 2 esophageal varices seen during EGD 12/2021), pulmonary hypertension, vitamin D deficiency who initially presented to [facility] emergency department via EMS with complaints of nausea vomiting and recent fall.  Patient also has been having short of breath for the last few months. ?Her CT chest showed bilateral PE with right heart strain.  Duplex ultrasound showed a right lower extremity superficial thrombosis. ?Patient is treated with IV heparin. ? ?Assessment and Plan: ?* Acute pulmonary embolism (HCC) ?Reviewed patient CT scan, moderate load of PE burden, but there is evidence of right heart strain. ?Patient has been having shortness of breath for months, the onset of PE appears to be gradual. ?Patient was also found to have superficial thrombosis in the right lower extremity which appears to be the source of blood clots. ?Underlying malignancy is unlikely the cause of hypercoagulable status. ?We will continue heparin.  Pending echocardiogram.  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 ?Patient has very high risk of bleeding, anticoagulation, she will be monitored very closely ? ?Lesion of cecum ?On CT scan, there is a thickened mucosa in the cecum concerning for malignancy.  Patient will need colonoscopy eventually.  However, given acute PE, colonoscopy should be delayed. ? ?Prolonged QT interval ?Continue to monitor, also will correct any abnormality with magnesium and potassium. ? ?Type 2 diabetes mellitus with hyperglycemia, without long-term current use of  insulin (HCC) ?Continue sliding scale insulin ? ? ?Hepatic cirrhosis (HCC) ?Known history of hepatic cirrhosis ?Complicated by hypertensive gastropathy and grade 2 esophageal varices noted via EGD 12/2021 ?Patient is at high risk of bleeding complications as a result of a heparin drip ?Patient actually dropped hemoglobin today, but no active bleeding.  Will monitor hemoglobin closely. ? ?Parkinson's disease (HCC) ?Continue home medicines.   ? ?GERD without esophagitis ?Continuing home regimen of PPI ?Patient may benefit from switching PPI to twice daily considering high risk of bleeding in this patient with cirrhosis, portal hypertensive gastropathy and esophageal varices ? ?Hypomagnesemia ?Patient received 2 g of magnesium sulfate for magnesium of 1.6. ? ?Chronic idiopathic thrombocytopenia (HCC) ?Reviewed prior lab, patient has been having chronic thrombocytopenia.  Appears to be mild.  Continue to follow. ? ?Chronic anemia ?Reviewed prior lab, patient has chronic anemia, but hemoglobin dropped down to 6.9, will transfuse 1 unit PRBC.  Discussed the benefit and the side effects of blood transfusion with daughter and patient, they have agreed to transfusion. ?We will also check iron B12 level. ?Her most recent lab study showed iron deficiency, she is currently receiving IV iron. ? ? ? ? ?  ? ?Subjective:  ?Patient feels very tired today, she has some short of breath with exertion.  She has no cough. ?She had a normal looking bowel movement today. ? ?Physical Exam: ?Vitals:  ? 03/01/22 2225 03/02/22 0007 03/02/22 0424 03/02/22 0831  ?BP: 140/79 133/65 127/72 (!) 102/47  ?Pulse: (!) 111 (!) 109 (!) 109 (!) 108  ?Resp: 18 18 18 16   ?Temp: 98.3 ?F (36.8 ?C) 99.4 ?F (37.4 ?C) 98.4 ?F (36.9 ?C) 98.3 ?F (36.8 ?C)  ?TempSrc:      ?SpO2: 97% 95%  95% 98%  ?Weight:      ?Height:      ? ?General exam: Appears calm and comfortable  ?Respiratory system: Clear to auscultation. Respiratory effort normal. ?Cardiovascular system: S1  & S2 heard, regular and tachycardic. No JVD, murmurs, rubs, gallops or clicks. No pedal edema. ?Gastrointestinal system: Abdomen is nondistended, soft and nontender. No organomegaly or masses felt. Normal bowel sounds heard. ?Central nervous system: Alert and oriented x3. No focal neurological deficits. ?Extremities: Symmetric 5 x 5 power. ?Skin: No rashes, lesions or ulcers ?Psychiatry: Judgement and insight appear normal. Mood & affect appropriate.  ? ?Data Reviewed: ? ?Reviewed current labs and a prior labs over the past year. ?Reviewed the CT scan results. ?Reviewed upper endoscopy results in 12/2021. ? ?Family Communication: Daughter updated, all questions answered. ? ?Disposition: ?Status is: Inpatient ?Remains inpatient appropriate because: Severity of disease, high risk of deterioration, IV treatment. ? Planned Discharge Destination:  to be determined ? ? ? ?Time spent: 45 minutes ? ?Author: ?Sharen Hones, MD ?03/02/2022 9:33 AM ? ?For on call review [email or web address].  ?

## 2022-03-02 NOTE — Progress Notes (Signed)
PT Cancellation Note ? ?Patient Details ?Name: Whitney [name] ?MRN: [id number] ?DOB: [birth year] ? ? ?Cancelled Treatment:    Reason Eval/Treat Not Completed: Medical issues which prohibited therapy.  Order received and chart reviewed. Per OT, pt off the unit for ultrasound, pending blood transfusion afterwards, request to hold this AM. Will continue to follow to initiate services as appropriate.  ? ? ?Gwenlyn Saran, PT, DPT ?03/02/22, 11:46 AM ? ?

## 2022-03-02 NOTE — Consult Note (Signed)
ANTICOAGULATION CONSULT NOTE - Initial Consult ? ?Pharmacy Consult for heparin infusion ?Indication: pulmonary embolus ? ?No Known Allergies ? ?Patient Measurements: ?Height: 5\' 2"  (157.5 cm) ?Weight: 75.8 kg (167 lb) ?IBW/kg (Calculated) : 50.1 ?Heparin Dosing Weight: 66.6 kg  ? ?Vital Signs: ?Temp: 98.4 ?F (36.9 ?C) (03/24 0424) ?BP: 127/72 (03/24 0424) ?Pulse Rate: 109 (03/24 0424) ? ?Labs: ?Recent Labs  ?  03/01/22 ?1451 03/01/22 ?1935 03/02/22 ?[id number]  ?HGB 8.0*  --  6.9*  ?HCT 27.2*  --  22.5*  ?PLT 174  --  128*  ?APTT  --  29  --   ?LABPROT  --  16.5*  --   ?INR  --  1.3*  --   ?HEPARINUNFRC  --   --  0.34  ?CREATININE 0.64  --  0.48  ? ? ? ?Estimated Creatinine Clearance: 53.5 mL/min (by C-G formula based on SCr of 0.48 mg/dL). ? ? ?Medical History: ?Past Medical History:  ?Diagnosis Date  ? Anemia   ? Arthritis   ? Broken shoulder   ? left humerus   ? COVID-19   ? Depression   ? Diabetes mellitus without complication ([place])   ? Goiter   ? Hyperlipidemia   ? IDA (iron deficiency anemia) 01/11/2022  ? Overactive bladder   ? Parkinson's disease ([place])   ? Pneumonia   ? Right arm weakness   ? limited movement  ? Unsteady gait   ? ? ?Medications:  ?No prior anticoagulation identified on chart review  ? ?Assessment: ?81 y.o. female who presents to the ED for evaluation of recurrent emesis and falls. Chest CT revealed acute right greater than left bilateral pulmonary emboli w/ RV/LV ratio = 1.1. Pharmacy consulted for initiation and management of heparin infusion ? ?3/24 0625 HL 0.34  ? ?Goal of Therapy:  ?Heparin level 0.3-0.7 units/ml ?Monitor platelets by anticoagulation protocol: Yes ?  ?Plan:  ?Heparin level is therapeutic. Will continue heparin infusion at 1150 units/hr. Recheck heparin level in 8 hours. CBC daily while on heparin.  ? ?Oswald Hillock, PharmD, BCPS ?Clinical Pharmacist   ?03/02/2022,7:37 AM ? ? ?

## 2022-03-02 NOTE — Progress Notes (Signed)
*  PRELIMINARY RESULTS* ?Echocardiogram ?2D Echocardiogram has been performed. ? ?Whitney [name] ?03/02/2022, 10:58 AM ?

## 2022-03-03 DIAGNOSIS — I2699 Other pulmonary embolism without acute cor pulmonale: Secondary | ICD-10-CM | POA: Diagnosis not present

## 2022-03-03 DIAGNOSIS — K7469 Other cirrhosis of liver: Secondary | ICD-10-CM | POA: Diagnosis not present

## 2022-03-03 DIAGNOSIS — E041 Nontoxic single thyroid nodule: Secondary | ICD-10-CM

## 2022-03-03 DIAGNOSIS — K639 Disease of intestine, unspecified: Secondary | ICD-10-CM | POA: Diagnosis not present

## 2022-03-03 LAB — TYPE AND SCREEN
ABO/RH(D): A NEG
Antibody Screen: NEGATIVE
Unit division: 0

## 2022-03-03 LAB — ECHOCARDIOGRAM COMPLETE
AR max vel: 2.09 cm2
AV Area VTI: 2.1 cm2
AV Area mean vel: 1.81 cm2
AV Mean grad: 5 mmHg
AV Peak grad: 9.6 mmHg
Ao pk vel: 1.55 m/s
Area-P 1/2: 5.16 cm2
Height: 62 in
MV VTI: 3.31 cm2
S' Lateral: 2.71 cm
Weight: 2672 oz

## 2022-03-03 LAB — GLUCOSE, CAPILLARY
Glucose-Capillary: 117 mg/dL — ABNORMAL HIGH (ref 70–99)
Glucose-Capillary: 169 mg/dL — ABNORMAL HIGH (ref 70–99)
Glucose-Capillary: 164 mg/dL — ABNORMAL HIGH (ref 70–99)
Glucose-Capillary: 206 mg/dL — ABNORMAL HIGH (ref 70–99)

## 2022-03-03 LAB — BPAM RBC
Blood Product Expiration Date: 202303272359
ISSUE DATE / TIME: 202303241315
Unit Type and Rh: 600

## 2022-03-03 LAB — CEA: CEA: 6.8 ng/mL — ABNORMAL HIGH (ref 0.0–4.7)

## 2022-03-03 LAB — BASIC METABOLIC PANEL
Anion gap: 8 (ref 5–15)
BUN: 13 mg/dL (ref 8–23)
CO2: 25 mmol/L (ref 22–32)
Calcium: 8.1 mg/dL — ABNORMAL LOW (ref 8.9–10.3)
Chloride: 105 mmol/L (ref 98–111)
Creatinine, Ser: 0.54 mg/dL (ref 0.44–1.00)
GFR, Estimated: >60 mL/min (ref >60)
Glucose, Bld: 119 mg/dL — ABNORMAL HIGH (ref 70–99)
Potassium: 3.9 mmol/L (ref 3.5–5.1)
Sodium: 138 mmol/L (ref 135–145)

## 2022-03-03 LAB — MAGNESIUM: Magnesium: 1.8 mg/dL (ref 1.7–2.4)

## 2022-03-03 LAB — CBC
HCT: 25.5 % — ABNORMAL LOW (ref 36.0–46.0)
Hemoglobin: 8 g/dL — ABNORMAL LOW (ref 12.0–15.0)
MCH: 27.5 pg (ref 26.0–34.0)
MCHC: 31.4 g/dL (ref 30.0–36.0)
MCV: 87.6 fL (ref 80.0–100.0)
Platelets: 136 10*3/uL — ABNORMAL LOW (ref 150–400)
RBC: 2.91 MIL/uL — ABNORMAL LOW (ref 3.87–5.11)
RDW: 22 % — ABNORMAL HIGH (ref 11.5–15.5)
WBC: 4.4 10*3/uL (ref 4.0–10.5)
nRBC: 0 % (ref 0.0–0.2)

## 2022-03-03 LAB — VITAMIN B12: Vitamin B-12: 463 pg/mL (ref 180–914)

## 2022-03-03 LAB — HEPARIN LEVEL (UNFRACTIONATED): Heparin Unfractionated: 0.44 IU/mL (ref 0.30–0.70)

## 2022-03-03 NOTE — Progress Notes (Signed)
?  Progress Note ? ? ?Patient: Whitney [name] [id number] DOB: 07/15/1941 DOA: 03/01/2022     2 ?DOS: the patient was seen and examined on 03/03/2022 ?  ?Brief hospital course: ?81 year old female with past medical history of Parkinson disease, non-insulin-dependent diabetes mellitus type 2, major depressive disorder, hepatic cirrhosis (complicated by portal hypertensive gastropathy and grade 2 esophageal varices seen during EGD 12/2021), pulmonary hypertension, vitamin D deficiency who initially presented to [facility] emergency department via EMS with complaints of nausea vomiting and recent fall.  Patient also has been having short of breath for the last few months. ?Her CT chest showed bilateral PE with right heart strain.  Duplex ultrasound showed a right lower extremity superficial thrombosis. ?Patient is treated with IV heparin. ? ?Assessment and Plan: ?* Acute pulmonary embolism ([place]) ?Echocardiogram still pending, continue heparin.  May consider change to Eliquis tomorrow. ? ?Lesion of cecum ?On CT scan, there is a thickened mucosa in the cecum concerning for malignancy.  Patient will need colonoscopy eventually.  However, given acute PE, colonoscopy should be delayed. ?Discussed with Dr. Marius Ditch, will schedule outpatient colonoscopy after discharge. ? ?Prolonged QT interval ?Continue to monitor, also will correct any abnormality with magnesium and potassium. ? ?Type 2 diabetes mellitus with hyperglycemia, without long-term current use of insulin ([place]) ?Continue sliding scale insulin. ? ? ?Hepatic cirrhosis ([place]) ?Hemoglobin is more stable after transfusion.  No evidence of active bleeding.  Patient will be continued on PPI. ? ?Parkinson's disease ([place]) ?Continue home medicines.   ? ?GERD without esophagitis ?Continue PPI. ? ?Thyroid nodule ?Incidental finding on image study. Recommended follow up ultrasound in 1 year.  ? ?Hypomagnesemia ?Magnesium level normalized ? ?Chronic idiopathic thrombocytopenia  (HCC) ?Condition stable/improving ? ?Chronic anemia ?Patient received 1 unit of PRBC and IV iron.  Hemoglobin is more stable.  Check a B12 level ? ? ? ? ?  ? ?Subjective:  ?Patient feels better, still short of breath with minimal exertion.  No cough. ?No abdominal pain or nausea vomiting. ? ?Physical Exam: ?Vitals:  ? 03/02/22 2005 03/02/22 2327 03/03/22 0428 03/03/22 0800  ?BP: 128/66 (!) 105/55 (!) 92/50 (!) 103/57  ?Pulse: (!) 104 (!) 102 (!) 109 (!) 101  ?Resp: 18 18 16 16   ?Temp: 98.1 ?F (36.7 ?C) 98.9 ?F (37.2 ?C) 98.2 ?F (36.8 ?C) 98.1 ?F (36.7 ?C)  ?TempSrc:      ?SpO2: 97% 100% 94% 97%  ?Weight:      ?Height:      ? ?General exam: Appears calm and comfortable  ?Respiratory system: Clear to auscultation. Respiratory effort normal. ?Cardiovascular system: S1 & S2 heard, RRR. No JVD, murmurs, rubs, gallops or clicks. No pedal edema. ?Gastrointestinal system: Abdomen is nondistended, soft and nontender. No organomegaly or masses felt. Normal bowel sounds heard. ?Central nervous system: Alert and oriented. No focal neurological deficits. ?Extremities: Symmetric 5 x 5 power. ?Skin: No rashes, lesions or ulcers ?Psychiatry: Judgement and insight appear normal. Mood & affect appropriate.  ? ?Data Reviewed: ? ?Lab results reviewed ? ?Family Communication: Daughter updated on the phone. ? ?Disposition: ?Status is: Inpatient ?Remains inpatient appropriate because: Severity of disease, IV treatment. ? Planned Discharge Destination: Skilled nursing facility ? ? ? ?Time spent: 27 minutes ? ?Author: ?Sharen Hones, MD ?03/03/2022 10:43 AM ? ?For on call review [email or web address].  ?

## 2022-03-03 NOTE — Consult Note (Signed)
ANTICOAGULATION CONSULT NOTE  ? ?Pharmacy Consult for heparin infusion ?Indication: pulmonary embolus ? ?No Known Allergies ? ?Patient Measurements: ?Height: 5\' 2"  (157.5 cm) ?Weight: 75.8 kg (167 lb) ?IBW/kg (Calculated) : 50.1 ?Heparin Dosing Weight: 66.6 kg  ? ?Vital Signs: ?Temp: 98.2 ?F (36.8 ?C) (03/25 0428) ?BP: 92/50 (03/25 0428) ?Pulse Rate: 109 (03/25 0428) ? ?Labs: ?Recent Labs  ?  03/01/22 ?1451 03/01/22 ?1935 03/02/22 ?[id number] 03/02/22 ?[id number] 03/02/22 ?1713 03/03/22 ?0432  ?HGB 8.0*  --  6.9*   < > 8.8* 8.0*  ?HCT 27.2*  --  22.5*  --   --  25.5*  ?PLT 174  --  128*  --   --  136*  ?APTT  --  29  --   --   --   --   ?LABPROT  --  16.5*  --   --   --   --   ?INR  --  1.3*  --   --   --   --   ?HEPARINUNFRC  --   --  0.34  --  0.49 0.44  ?CREATININE 0.64  --  0.48  --   --  0.54  ? < > = values in this interval not displayed.  ? ? ? ?Estimated Creatinine Clearance: 53.5 mL/min (by C-G formula based on SCr of 0.54 mg/dL). ? ? ?Medical History: ?Past Medical History:  ?Diagnosis Date  ? Anemia   ? Arthritis   ? Broken shoulder   ? left humerus   ? COVID-19   ? Depression   ? Diabetes mellitus without complication ([place])   ? Goiter   ? Hyperlipidemia   ? IDA (iron deficiency anemia) 01/11/2022  ? Overactive bladder   ? Parkinson's disease ([place])   ? Pneumonia   ? Right arm weakness   ? limited movement  ? Unsteady gait   ? ? ?Medications:  ?No prior anticoagulation identified on chart review  ? ?Assessment: ?81 y.o. female who presents to the ED for evaluation of recurrent emesis and falls. Chest CT revealed acute right greater than left bilateral pulmonary emboli w/ RV/LV ratio = 1.1. Pharmacy consulted for initiation and management of heparin infusion ? ?3/24 0625 HL 0.34  ?3/24 1713 HL 0.49 ?3/25 0432 HL 0.44, therapeutic x 3 ? ?Goal of Therapy:  ?Heparin level 0.3-0.7 units/ml ?Monitor platelets by anticoagulation protocol: Yes ?  ?Plan:  ?Will continue heparin infusion at 1150 units/hr. ?Recheck HL daily w/ AM  labs.  ?CBC daily while on heparin. ? ?Renda Rolls, PharmD, MBA ?03/03/2022 ?6:23 AM ? ? ?

## 2022-03-03 NOTE — Evaluation (Signed)
Physical Therapy Evaluation ?Patient Details ?Name: Whitney [name] ?MRN: [id number] ?DOB: 1941/10/03 ?Today's Date: 03/03/2022 ? ?History of Present Illness ? Pt is an 81 y/o F admitted on 03/01/22 after presenting to the ED with c/o N&V & recent fall. CT chest showed bilateral PE with right heart strain, Duplex ultrasound showed a right lower extremity superficial thrombosis. Dr. Roosevelt Locks cleared pt for therapy prior to completing 48 hours of anticoagulation via secure chat on 03/03/22. PMH: Parkinson's disease, NIDDM 2, MDD, hepatic cirrhosis, pulmonary HTN, vitamin D deficiency  ?Clinical Impression ? Pt seen for PT evaluation with pt agreeable to tx. Pt reports prior to admission she has a personal care aide that provides assistance 11 hours/day but pt is home alone at times but notes her sister will come assist PRN. Pt requires mod assist for bed mobility & CGA<>min assist for transfers with RW. Pt ambulates short distance in room with RW & min assist but requires heavy cuing for safety as pt is extremely limited by festinating gait pattern and poor ability to change gait/steps despite various multimodal cuing. Pt would benefit from STR upon d/c to maximize independence with functional mobility & reduce fall risk prior to return home. ?  ? ?Recommendations for follow up therapy are one component of a multi-disciplinary discharge planning process, led by the attending physician.  Recommendations may be updated based on patient status, additional functional criteria and insurance authorization. ? ?Follow Up Recommendations Skilled nursing-short term rehab (<3 hours/day) ? ?  ?Assistance Recommended at Discharge Frequent or constant Supervision/Assistance  ?Patient can return home with the following ? A lot of help with walking and/or transfers;A lot of help with bathing/dressing/bathroom;Assistance with cooking/housework;Direct supervision/assist for financial management;Assist for transportation;Help with stairs or ramp  for entrance;Direct supervision/assist for medications management ? ?  ?Equipment Recommendations None recommended by PT  ?Recommendations for Other Services ?    ?  ?Functional Status Assessment Patient has had a recent decline in their functional status and demonstrates the ability to make significant improvements in function in a reasonable and predictable amount of time.  ? ?  ?Precautions / Restrictions Precautions ?Precautions: Fall ?Restrictions ?Weight Bearing Restrictions: No  ? ?  ? ?Mobility ? Bed Mobility ?Overal bed mobility: Needs Assistance ?Bed Mobility: Supine to Sit ?  ?  ?Supine to sit: Mod assist, HOB elevated ?  ?  ?General bed mobility comments: assistance to upright self & scoot to sitting EOB with use of bed rails & HOB elevated ?  ? ?Transfers ?Overall transfer level: Needs assistance ?Equipment used: Rolling walker (2 wheels) ?Transfers: Sit to/from Stand, Bed to chair/wheelchair/BSC ?Sit to Stand: Min guard ?  ?Step pivot transfers: Min assist (bed>recliner with RW) ?  ?  ?  ?  ?  ? ?Ambulation/Gait ?Ambulation/Gait assistance: Min assist ?Gait Distance (Feet): 15 Feet ?Assistive device: Rolling walker (2 wheels) ?Gait Pattern/deviations: Decreased step length - left, Decreased dorsiflexion - right, Decreased dorsiflexion - left, Decreased stride length, Decreased step length - right, Festinating ?Gait velocity: decreased ?  ?  ?General Gait Details: Pt demonstrates significant festinating gait pattern. PT provides cuing for "BIG" steps but with no improvement. With cuing, pt is able to flex hip/knee to simulate marching with cuing to then move foot L/R to take step. Pt requires pauses to re-set herself to attempt to ambulate again. ? ?Stairs ?  ?  ?  ?  ?  ? ?Wheelchair Mobility ?  ? ?Modified Rankin (Stroke Patients Only) ?  ? ?  ? ?  Balance Overall balance assessment: Needs assistance ?Sitting-balance support: Single extremity supported, No upper extremity supported, Feet  supported ?Sitting balance-Leahy Scale: Fair ?Sitting balance - Comments: supervision static sitting ?  ?Standing balance support: During functional activity, Reliant on assistive device for balance, Bilateral upper extremity supported ?Standing balance-Leahy Scale: Poor ?  ?  ?  ?  ?  ?  ?  ?  ?  ?  ?  ?  ?   ? ? ? ?Pertinent Vitals/Pain Pain Assessment ?Pain Assessment: Faces ?Faces Pain Scale: Hurts little more ?Pain Location: stomach cramping ?Pain Descriptors / Indicators: Cramping ?Pain Intervention(s): Monitored during session  ? ? ?Home Living Family/patient expects to be discharged to:: Private residence ?Living Arrangements: Alone ?Available Help at Discharge: Personal care attendant;Family;Available PRN/intermittently (Pt reports personal care aide 8AM-3PM and 5PM-9PM with sister coming to assist in between PRN, alone at night) ?Type of Home: House ?Home Access: Ramped entrance ?  ?  ?  ?Home Layout: One level ?Home Equipment: Conservation officer, nature (2 wheels);Rollator (4 wheels);Shower seat;Wheelchair - Press photographer;Toilet riser ?   ?  ?Prior Function Prior Level of Function : History of Falls (last six months);Needs assist ?  ?  ?  ?Physical Assist : Mobility (physical);ADLs (physical) ?Mobility (physical): Gait ?ADLs (physical): Bathing;Dressing;Toileting;IADLs ?Mobility Comments: Pt reports that she was working with [facility] and [facility] just prior to admission (up through 3/21) and had been able to walk 80' with PT. Also endorses using RW, w/c, and scooter prior to admission. Per OT, pt reported daily falls at home. ?ADLs Comments: Caregivers assist in some dressing, toileting, transfers, bathing, meals, and medications; dtr/sister assist with transportation. Pt endorses damily falls. ?  ? ? ?Hand Dominance  ?   ? ?  ?Extremity/Trunk Assessment  ? Upper Extremity Assessment ?Upper Extremity Assessment: Generalized weakness ?  ? ?Lower Extremity Assessment ?Lower Extremity Assessment: Generalized  weakness ?  ? ?   ?Communication  ? Communication: No difficulties  ?Cognition Arousal/Alertness: Awake/alert ?Behavior During Therapy: [facility] for tasks assessed/performed ?Overall Cognitive Status: No family/caregiver present to determine baseline cognitive functioning ?  ?  ?  ?  ?  ?  ?  ?  ?  ?  ?  ?  ?  ?  ?  ?  ?General Comments: Need to assess further in functional context but pt does appear to have somewhat impaired orientation, impaired motor planning & initiation & execution of mobility tasks. ?  ?  ? ?  ?General Comments General comments (skin integrity, edema, etc.): Pt does c/o feeling SOB at times with SpO2 >/= 90% on room air throughout session. ? ?  ?Exercises Other Exercises ?Other Exercises: Pt performs 5x STS from recliner without BUE support with min/mod assist with focus on BLE strengthening & mechanics of transfer. During activity pt continues to lean back on recliner with BLE for support. PT provides max mulitmodal cuing for flexing at waist to facilitate anterior weight shifting to then push up with BLE. Pt is limited by poor motor planning/impaired cognition.  ? ?Assessment/Plan  ?  ?PT Assessment Patient needs continued PT services  ?PT Problem List Decreased strength;Decreased mobility;Decreased safety awareness;Decreased coordination;Decreased activity tolerance;Decreased cognition;Pain;Decreased balance;Decreased knowledge of use of DME ? ?   ?  ?PT Treatment Interventions DME instruction;Therapeutic exercise;Gait training;Balance training;Stair training;Neuromuscular re-education;Cognitive remediation;Functional mobility training;Patient/family education;Therapeutic activities   ? ?PT Goals (Current goals can be found in the Care Plan section)  ?Acute Rehab PT Goals ?Patient Stated Goal: get better ?PT Goal Formulation: With  patient ?Time For Goal Achievement: 03/17/22 ?Potential to Achieve Goals: Good ? ?  ?Frequency Min 2X/week ?  ? ? ?Co-evaluation   ?  ?  ?  ?  ? ? ?  ?AM-PAC PT "6  Clicks" Mobility  ?Outcome Measure Help needed turning from your back to your side while in a flat bed without using bedrails?: A Little ?Help needed moving from lying on your back to sitting on the side of a flat bed without

## 2022-03-03 NOTE — Assessment & Plan Note (Signed)
Incidental finding on image study. Recommended follow up ultrasound in 1 year.  ?

## 2022-03-03 NOTE — Evaluation (Signed)
Occupational Therapy Evaluation Patient Details Name: Whitney [name] MRN: [id number] DOB: 1941-06-16 Today's Date: 03/03/2022   History of Present Illness 81 y.o. female with PMHx of Parkinson disease, non-insulin-dependent diabetes mellitus type 2, major depressive disorder, hepatic cirrhosis (complicated by portal hypertensive gastropathy and grade 2 esophageal varices seen during EGD 12/2021), pulmonary hypertension, vitamin D deficiency who initially presented to [facility] ED via EMS with complaints of nausea vomiting and recent fall.  Patient also has been short of breath for the last few months. Her CT chest showed bilateral PE with right heart strain.  Duplex ultrasound showed a right lower extremity superficial thrombosis. Cleared by MD to mobilize.   Clinical Impression   Pt was seen for OT evaluation this date. Prior to hospital admission, pt was walking up to 47' with RW and HHPT, but more often with PCAs was completing stand/step pivot transfers. Pt lives alone but has PCAs who assist with bathing, dressing, toileting, and transfers during the day up until 9pm. She reports she usually doesn't have any help overnight but also reports that her daughter is out of town until next week so she has even less assist available. Currently pt demonstrates impairments as described below (See OT problem list) which functionally limit her ability to perform ADL/self-care tasks. Pt currently requires MIN A for bed mobility, CGA for ADL transfers, and CGA-MIN A for step pivot transfers to the [facility] with VC for exaggerated movements to assist with initiation of BLE movement. Pt demo's strong tendency to keep eyes closed during mobility requiring intermittent VC for open her eyes to improve her balance, especially in light of significant falls history (50+/95mo). Pt would benefit from skilled OT services to address noted impairments and functional limitations (see below for any additional details) in order to maximize  safety and independence while minimizing falls risk and caregiver burden. Upon hospital discharge, recommend STR to maximize pt safety and return to PLOF.    Recommendations for follow up therapy are one component of a multi-disciplinary discharge planning process, led by the attending physician.  Recommendations may be updated based on patient status, additional functional criteria and insurance authorization.   Follow Up Recommendations  Skilled nursing-short term rehab (<3 hours/day)    Assistance Recommended at Discharge Frequent or constant Supervision/Assistance  Patient can return home with the following A little help with walking and/or transfers;A lot of help with bathing/dressing/bathroom;Assistance with cooking/housework;Assist for transportation;Direct supervision/assist for medications management;Help with stairs or ramp for entrance    Functional Status Assessment  Patient has had a recent decline in their functional status and demonstrates the ability to make significant improvements in function in a reasonable and predictable amount of time.  Equipment Recommendations  BSC/3in1    Recommendations for Other Services       Precautions / Restrictions Precautions Precautions: Fall Restrictions Weight Bearing Restrictions: No      Mobility Bed Mobility Overal bed mobility: Needs Assistance Bed Mobility: Supine to Sit, Sit to Supine     Supine to sit: Min assist, HOB elevated Sit to supine: Min guard        Transfers Overall transfer level: Needs assistance Equipment used: Rolling walker (2 wheels) Transfers: Sit to/from Stand Sit to Stand: Min guard, From elevated surface                  Balance Overall balance assessment: Needs assistance Sitting-balance support: Single extremity supported, No upper extremity supported, Feet supported Sitting balance-Leahy Scale: Fair     Standing balance  support: During functional activity, Reliant on assistive  device for balance, Bilateral upper extremity supported Standing balance-Leahy Scale: Fair                             ADL either performed or assessed with clinical judgement   ADL                                         General ADL Comments: Pt requires MAX A for LB ADL tasks from seated position, CGA-MIN A for ADL transfers with VC for hand placement, RW mgt. MAX A for pericare in standing, MIN A for clothing mgt after toileting while in standing. Intermittent VC for keeping eyes open instead of tightly shut.     Vision         Perception     Praxis      Pertinent Vitals/Pain Pain Assessment Pain Assessment: 0-10 Pain Score: 6  Pain Location: abdomen Pain Descriptors / Indicators: Aching Pain Intervention(s): Limited activity within patient's tolerance, Monitored during session, Repositioned     Hand Dominance     Extremity/Trunk Assessment Upper Extremity Assessment Upper Extremity Assessment: Generalized weakness   Lower Extremity Assessment Lower Extremity Assessment: Generalized weakness       Communication Communication Communication: No difficulties   Cognition Arousal/Alertness: Awake/alert Behavior During Therapy: WFL for tasks assessed/performed Overall Cognitive Status: No family/caregiver present to determine baseline cognitive functioning                                 General Comments: Alert and oriented, follows commands, tends to keep eyes closed while mobilizing and while speaking with OT     General Comments       Exercises Other Exercises Other Exercises: Pt educated in falls prevention strategies to maximize safety   Shoulder Instructions      Home Living Family/patient expects to be discharged to:: Private residence Living Arrangements: Alone Available Help at Discharge: Personal care attendant;Family;Available PRN/intermittently (PCA daily up until 9pm)   Home Access: Ramped entrance      Home Layout: One level     Bathroom Shower/Tub: Producer, television/film/video: Handicapped height Bathroom Accessibility: Yes   Home Equipment: Agricultural consultant (2 wheels);Rollator (4 wheels);Shower seat;Wheelchair - IT trainer;Toilet riser          Prior Functioning/Environment Prior Level of Function : History of Falls (last six months);Needs assist       Physical Assist : Mobility (physical);ADLs (physical) Mobility (physical): Gait ADLs (physical): Bathing;Dressing;Toileting;IADLs Mobility Comments: Pt reports that she was working with [facility] and HHPT just prior to admission (up through 3/21) and had been able to walk 80' with PT. ADLs Comments: Caregivers assist in some dressing, toileting, transfers, bathing, meals, and medications; dtr/sister assist with transportation. Pt endorses damily falls.        OT Problem List: Decreased strength;Decreased coordination;Impaired balance (sitting and/or standing);Decreased knowledge of use of DME or AE      OT Treatment/Interventions: Self-care/ADL training;Therapeutic exercise;Therapeutic activities;Neuromuscular education;DME and/or AE instruction;Patient/family education;Balance training    OT Goals(Current goals can be found in the care plan section) Acute Rehab OT Goals Patient Stated Goal: get stronger OT Goal Formulation: With patient Time For Goal Achievement: 03/17/22 Potential to Achieve Goals: Good ADL Goals Pt Will  Perform Lower Body Dressing: with adaptive equipment;sit to/from stand;with min assist Pt Will Transfer to Toilet: with supervision;ambulating;bedside commode (LRAD) Pt Will Perform Toileting - Clothing Manipulation and hygiene: sitting/lateral leans;with adaptive equipment;with set-up;with supervision Additional ADL Goal #1: Pt will verbalize plan to implement at least 2 learned falls prevention strategies.  OT Frequency: Min 2X/week    Co-evaluation              AM-PAC OT "6  Clicks" Daily Activity     Outcome Measure Help from another person eating meals?: None Help from another person taking care of personal grooming?: A Little Help from another person toileting, which includes using toliet, bedpan, or urinal?: A Lot Help from another person bathing (including washing, rinsing, drying)?: A Lot Help from another person to put on and taking off regular upper body clothing?: A Little Help from another person to put on and taking off regular lower body clothing?: A Lot 6 Click Score: 16   End of Session Equipment Utilized During Treatment: Rolling walker (2 wheels)  Activity Tolerance: Patient tolerated treatment well Patient left: in bed;with call bell/phone within reach;with bed alarm set  OT Visit Diagnosis: Other abnormalities of gait and mobility (R26.89);Repeated falls (R29.6);Muscle weakness (generalized) (M62.81)                Time: 1610-9604 OT Time Calculation (min): 29 min Charges:  OT General Charges $OT Visit: 1 Visit OT Evaluation $OT Eval Moderate Complexity: 1 Mod OT Treatments $Self Care/Home Management : 23-37 mins  Arman Filter., MPH, MS, OTR/L ascom [phone] 03/03/22, 12:42 PM

## 2022-03-04 DIAGNOSIS — E1165 Type 2 diabetes mellitus with hyperglycemia: Secondary | ICD-10-CM | POA: Diagnosis not present

## 2022-03-04 DIAGNOSIS — D649 Anemia, unspecified: Secondary | ICD-10-CM | POA: Diagnosis not present

## 2022-03-04 DIAGNOSIS — I2699 Other pulmonary embolism without acute cor pulmonale: Secondary | ICD-10-CM | POA: Diagnosis not present

## 2022-03-04 LAB — HEPARIN LEVEL (UNFRACTIONATED): Heparin Unfractionated: 0.41 IU/mL (ref 0.30–0.70)

## 2022-03-04 LAB — GLUCOSE, CAPILLARY
Glucose-Capillary: 140 mg/dL — ABNORMAL HIGH (ref 70–99)
Glucose-Capillary: 185 mg/dL — ABNORMAL HIGH (ref 70–99)
Glucose-Capillary: 150 mg/dL — ABNORMAL HIGH (ref 70–99)
Glucose-Capillary: 179 mg/dL — ABNORMAL HIGH (ref 70–99)

## 2022-03-04 LAB — MAGNESIUM: Magnesium: 1.7 mg/dL (ref 1.7–2.4)

## 2022-03-04 LAB — BASIC METABOLIC PANEL
Anion gap: 8 (ref 5–15)
BUN: 10 mg/dL (ref 8–23)
CO2: 25 mmol/L (ref 22–32)
Calcium: 8.4 mg/dL — ABNORMAL LOW (ref 8.9–10.3)
Chloride: 105 mmol/L (ref 98–111)
Creatinine, Ser: 0.61 mg/dL (ref 0.44–1.00)
GFR, Estimated: >60 mL/min (ref >60)
Glucose, Bld: 134 mg/dL — ABNORMAL HIGH (ref 70–99)
Potassium: 4.1 mmol/L (ref 3.5–5.1)
Sodium: 138 mmol/L (ref 135–145)

## 2022-03-04 LAB — CBC
HCT: 25.7 % — ABNORMAL LOW (ref 36.0–46.0)
Hemoglobin: 8 g/dL — ABNORMAL LOW (ref 12.0–15.0)
MCH: 27.7 pg (ref 26.0–34.0)
MCHC: 31.1 g/dL (ref 30.0–36.0)
MCV: 88.9 fL (ref 80.0–100.0)
Platelets: 123 10*3/uL — ABNORMAL LOW (ref 150–400)
RBC: 2.89 MIL/uL — ABNORMAL LOW (ref 3.87–5.11)
RDW: 22.1 % — ABNORMAL HIGH (ref 11.5–15.5)
WBC: 3.5 10*3/uL — ABNORMAL LOW (ref 4.0–10.5)
nRBC: 0 % (ref 0.0–0.2)

## 2022-03-04 MED ORDER — MAGNESIUM SULFATE 2 GM/50ML IV SOLN
2.0000 g | Freq: Once | INTRAVENOUS | Status: AC
  Administered 2022-03-04: 2 g via INTRAVENOUS
  Filled 2022-03-04: qty 50

## 2022-03-04 MED ORDER — APIXABAN 5 MG PO TABS
10.0000 mg | ORAL_TABLET | Freq: Two times a day (BID) | ORAL | Status: DC
  Administered 2022-03-04 – 2022-03-06 (×5): 10 mg via ORAL
  Filled 2022-03-04 (×5): qty 2

## 2022-03-04 MED ORDER — APIXABAN 5 MG PO TABS: 5.0000 mg | ORAL_TABLET | Freq: Two times a day (BID) | ORAL | Status: DC

## 2022-03-04 MED ORDER — LACTULOSE 10 GM/15ML PO SOLN
20.0000 g | Freq: Every day | ORAL | Status: DC
  Administered 2022-03-04 – 2022-03-05 (×2): 20 g via ORAL
  Filled 2022-03-04 (×2): qty 30

## 2022-03-04 NOTE — Progress Notes (Signed)
?  Progress Note ? ? ?Patient: Whitney [name] [id number] DOB: September 19, 1941 DOA: 03/01/2022     3 ?DOS: the patient was seen and examined on 03/04/2022 ?  ?Brief hospital course: ?81 year old female with past medical history of Parkinson disease, non-insulin-dependent diabetes mellitus type 2, major depressive disorder, hepatic cirrhosis (complicated by portal hypertensive gastropathy and grade 2 esophageal varices seen during EGD 12/2021), pulmonary hypertension, vitamin D deficiency who initially presented to [facility] emergency department via EMS with complaints of nausea vomiting and recent fall.  Patient also has been having short of breath for the last few months. ?Her CT chest showed bilateral PE with right heart strain.  Duplex ultrasound showed a right lower extremity superficial thrombosis. ?Patient is treated with IV heparin. ?3/26.  Echocardiogram did not show any right heart strain ejection fraction 65 to 70%.  Anticoagulation changed to Eliquis.  Patient need nursing placement per physical therapy. ? ?Assessment and Plan: ?* Acute pulmonary embolism ([place]) ?Change anticoagulation to Eliquis. ? ?Lesion of cecum ?On CT scan, there is a thickened mucosa in the cecum concerning for malignancy.  Patient will need colonoscopy eventually.  However, given acute PE, colonoscopy should be delayed. ?Discussed with Dr. Marius Ditch, will schedule outpatient colonoscopy after discharge. ? ?Prolonged QT interval ?Monitor electrolytes. ? ?Type 2 diabetes mellitus with hyperglycemia, without long-term current use of insulin ([place]) ?Continue current treatment with sliding scale insulin. ? ? ?Hepatic cirrhosis ([place]) ?No evidence of GI bleed.  Hemoglobin has been stable. ? ?Parkinson's disease ([place]) ?Continue home medicines.   ? ?GERD without esophagitis ?Continue PPI. ? ?Thyroid nodule ?Incidental finding on image study. Recommended follow up ultrasound in 1 year.  ? ?Hypomagnesemia ?Magnesium level normalized ? ?Chronic idiopathic  thrombocytopenia (HCC) ?Condition stable/improving ? ?Chronic anemia ?Patient received 1 unit of PRBC and IV iron.  Hemoglobin is more stable.  B12 level normal. ? ? ? ? ?  ? ?Subjective:  ?Patient still has some short of breath with exertion.  Feels tired. ?No abdominal pain or nausea vomiting.  But feel constipated, increase lactulose to 20 g daily. ? ?Physical Exam: ?Vitals:  ? 03/03/22 1934 03/03/22 2349 03/04/22 0404 03/04/22 [id number]  ?BP: (!) 115/59 (!) 116/57 (!) 99/59 118/62  ?Pulse: 98 93 97 97  ?Resp: 18 18 18 16   ?Temp: 99 ?F (37.2 ?C) 98.7 ?F (37.1 ?C) 98.4 ?F (36.9 ?C) 98 ?F (36.7 ?C)  ?TempSrc:      ?SpO2: 97% 98% 98% 97%  ?Weight:      ?Height:      ? ?General exam: Appears calm and comfortable  ?Respiratory system: Clear to auscultation. Respiratory effort normal. ?Cardiovascular system: S1 & S2 heard, RRR. No JVD, murmurs, rubs, gallops or clicks. No pedal edema. ?Gastrointestinal system: Abdomen is nondistended, soft and nontender. No organomegaly or masses felt. Normal bowel sounds heard. ?Central nervous system: Alert and oriented. No focal neurological deficits. ?Extremities: Symmetric 5 x 5 power. ?Skin: No rashes, lesions or ulcers ?Psychiatry: Judgement and insight appear normal. Mood & affect appropriate.  ? ?Data Reviewed: ? ?Reviewed echocardiogram, lab results ? ?Family Communication:  ? ?Disposition: ?Status is: Inpatient ?Remains inpatient appropriate because: Unsafe discharge, pending nursing home placement ? Planned Discharge Destination: Skilled nursing facility ? ? ? ?Time spent: 28 minutes ? ?Author: ?Sharen Hones, MD ?03/04/2022 11:18 AM ? ?For on call review [email or web address].  ?

## 2022-03-04 NOTE — Consult Note (Signed)
ANTICOAGULATION CONSULT NOTE  ? ?Pharmacy Consult for heparin infusion ?Indication: pulmonary embolus ? ?No Known Allergies ? ?Patient Measurements: ?Height: 5\' 2"  (157.5 cm) ?Weight: 75.8 kg (167 lb) ?IBW/kg (Calculated) : 50.1 ?Heparin Dosing Weight: 66.6 kg  ? ?Vital Signs: ?Temp: 98.4 ?F (36.9 ?C) (03/26 0404) ?BP: 99/59 (03/26 0404) ?Pulse Rate: 97 (03/26 0404) ? ?Labs: ?Recent Labs  ?  03/01/22 ?1451 03/01/22 ?1935 03/02/22 ?03/04/22 03/02/22 ?03/04/22 03/02/22 ?1713 03/03/22 ?0432 03/04/22 ?03/06/22  ?HGB  --   --  6.9*   < > 8.8* 8.0* 8.0*  ?HCT  --   --  22.5*  --   --  25.5* 25.7*  ?PLT  --   --  128*  --   --  136* 123*  ?APTT  --  29  --   --   --   --   --   ?LABPROT  --  16.5*  --   --   --   --   --   ?INR  --  1.3*  --   --   --   --   --   ?HEPARINUNFRC   < >  --  0.34  --  0.49 0.44 0.41  ?CREATININE  --   --  0.48  --   --  0.54 0.61  ? < > = values in this interval not displayed.  ? ? ? ?Estimated Creatinine Clearance: 53.5 mL/min (by C-G formula based on SCr of 0.61 mg/dL). ? ? ?Medical History: ?Past Medical History:  ?Diagnosis Date  ? Anemia   ? Arthritis   ? Broken shoulder   ? left humerus   ? COVID-19   ? Depression   ? Diabetes mellitus without complication (HCC)   ? Goiter   ? Hyperlipidemia   ? IDA (iron deficiency anemia) 01/11/2022  ? Overactive bladder   ? Parkinson's disease (HCC)   ? Pneumonia   ? Right arm weakness   ? limited movement  ? Unsteady gait   ? ? ?Medications:  ?No prior anticoagulation identified on chart review  ? ?Assessment: ?81 y.o. female who presents to the ED for evaluation of recurrent emesis and falls. Chest CT revealed acute right greater than left bilateral pulmonary emboli w/ RV/LV ratio = 1.1. Pharmacy consulted for initiation and management of heparin infusion ? ?3/24 0625 HL 0.34  ?3/24 1713 HL 0.49 ?3/25 0432 HL 0.44, therapeutic x 3 ?3/26 0553 HL 0.41, therapeutic x 4 ? ?Goal of Therapy:  ?Heparin level 0.3-0.7 units/ml ?Monitor platelets by anticoagulation  protocol: Yes ?  ?Plan:  ?Will continue heparin infusion at 1150 units/hr. ?Recheck HL daily w/ AM labs.  ?CBC daily while on heparin. ? ?4/26, PharmD, MBA ?03/04/2022 ?6:39 AM ? ? ?

## 2022-03-04 NOTE — [place] FL2 (Signed)
?[place] MEDICAID FL2 LEVEL OF CARE SCREENING TOOL  ?  ? ?IDENTIFICATION  ?Patient Name: ?Whitney [name] Birthdate: May 05, 1941 Sex: female Admission Date (Current Location): ?03/01/2022  ?[place] and [place] Number: ? [place] ?  Facility and Address:  ?[facility], [address] [place], [place] [address] ?     Provider Number: ?[id number]  ?Attending Physician Name and Address:  ?Sharen Hones, MD ? Relative Name and Phone Number:  ?BOGGS,CHERYL (POA) (Daughter)   [phone] (Mobile) ?   ?Current Level of Care: ?Hospital Recommended Level of Care: ?[place] Prior Approval Number: ?  ? ?Date Approved/Denied: ?  PASRR Number: ?[id number] ? ?Discharge Plan: ?SNF ?  ? ?Current Diagnoses: ?Patient Active Problem List  ? Diagnosis Date Noted  ? Thyroid nodule 03/03/2022  ? Chronic anemia 03/02/2022  ? 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 Hypomagnesemia 03/02/2022  ? Acute pulmonary embolism ([place]) 03/01/2022  ? Prolonged QT interval 03/01/2022  ? GERD without esophagitis 03/01/2022  ? Lesion of cecum 03/01/2022  ? IDA (iron deficiency anemia) 01/11/2022  ? Pancytopenia ([place])   ? Epigastric pain   ? Secondary esophageal varices without bleeding ([place])   ? Weakness   ? Melena 12/27/2021  ? Lactic acidosis   ? Drop in hemoglobin   ? Type 2 diabetes mellitus with hyperglycemia, without long-term current use of insulin ([place])   ? Depression 12/26/2021  ? Sepsis ([place]) 12/26/2021  ? Iron deficiency anemia 12/26/2021  ? Hepatic cirrhosis ([place]) 12/26/2021  ? Slurred speech 12/26/2021  ? Pyuria 12/26/2021  ? Thyroid goiter 08/03/2020  ? Impaired mobility and ADLs 04/15/2020  ? Vitamin D deficiency 07/20/2019  ? 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[place]) 07/20/2019  ? At high risk for falls 07/20/2019  ? Parkinson's disease ([place]) 01/15/2019  ? Hyperlipidemia 01/15/2019  ? Mixed diabetic hyperlipidemia associated with type 2 diabetes mellitus ([place]) 01/13/2019   ? Overactive bladder 01/13/2019  ? Obesity (BMI 30.0-34.9) 01/13/2019  ? ? ?Orientation RESPIRATION BLADDER Height & Weight   ?  ?Self, Time, Situation, Place ? Normal Continent Weight: 167 lb (75.8 kg) ?Height:  5\' 2"  (157.5 cm)  ?BEHAVIORAL SYMPTOMS/MOOD NEUROLOGICAL BOWEL NUTRITION STATUS  ?    Continent Diet  ?AMBULATORY STATUS COMMUNICATION OF NEEDS Skin   ?Limited Assist Verbally Normal ?  ?  ?  ?    ?     ?     ? ? ?Personal Care Assistance Level of Assistance  ?Bathing, Dressing, Total care Bathing Assistance: Maximum assistance ?Feeding assistance: Independent ?Dressing Assistance: Limited assistance ?Total Care Assistance: Limited assistance  ? ?Functional Limitations Info  ?Sight, Hearing, Speech Sight Info: Adequate ?Hearing Info: Adequate ?Speech Info: Adequate  ? ? ?SPECIAL CARE FACTORS FREQUENCY  ?PT (By licensed PT), OT (By licensed OT)   ?  ?PT Frequency: 5X per week ?OT Frequency: 5X per week ?  ?  ?  ?   ? ? ?Contractures Contractures Info: Not present  ? ? ?Additional Factors Info  ?    ?  ?  ?[place] COVID-19 Vaccine 11/28/2020 , 01/21/2020 , 12/31/2019  ? ?  ?  ?   ? ?Current Medications (03/04/2022):  This is the current hospital active medication list ?Current Facility-Administered Medications  ?Medication Dose Route Frequency Provider Last Rate Last Admin  ? acetaminophen (TYLENOL) tablet 650 mg  650 mg Oral Q6H PRN Vernelle Emerald, MD   650 mg at 03/03/22 0442  ? Or  ? acetaminophen (TYLENOL) suppository  650 mg  650 mg Rectal Q6H PRN Shalhoub, Sherryll Burger, MD      ? amantadine (SYMMETREL) capsule 100 mg  100 mg Oral BID Vernelle Emerald, MD   100 mg at 03/04/22 [id number]  ? apixaban (ELIQUIS) tablet 10 mg  10 mg Oral BID Wynelle Cleveland, RPH   10 mg at 03/04/22 [id number]  ? Followed by  ? [START ON 03/11/2022] apixaban (ELIQUIS) tablet 5 mg  5 mg Oral BID Wynelle Cleveland, RPH      ? buPROPion (WELLBUTRIN XL) 24 hr tablet 150 mg  150 mg Oral Daily Shalhoub, Sherryll Burger, MD   150 mg at 03/04/22 [id number]  ?  cholecalciferol (VITAMIN D3) tablet 1,000 Units  1,000 Units Oral Daily Shalhoub, Sherryll Burger, MD   1,000 Units at 03/04/22 [phone]  ? ferrous sulfate tablet 325 mg  325 mg Oral Q breakfast Sharen Hones, MD   325 mg at 03/04/22 [id number]  ? insulin aspart (novoLOG) injection 0-15 Units  0-15 Units Subcutaneous TID AC & HS Shalhoub, Sherryll Burger, MD   2 Units at 03/04/22 [phone]  ? lactulose (CHRONULAC) 10 GM/15ML solution 20 g  20 g Oral Daily Sharen Hones, MD   20 g at 03/04/22 [id number]  ? methocarbamol (ROBAXIN) tablet 500 mg  500 mg Oral Q8H PRN Shalhoub, Sherryll Burger, MD      ? pantoprazole (PROTONIX) EC tablet 40 mg  40 mg Oral BID Arta Bruce, MD   40 mg at 03/04/22 [id number]  ? polyethylene glycol (MIRALAX / GLYCOLAX) packet 34 g  34 g Oral Daily Lin Landsman, MD   34 g at 03/04/22 [id number]  ? trospium (SANCTURA) tablet 20 mg  20 mg Oral BID Vernelle Emerald, MD   20 mg at 03/04/22 [id number]  ? ? ? ?Discharge Medications: ?Please see discharge summary for a list of discharge medications. ? ?Relevant Imaging Results: ? ?Relevant Lab Results: ? ? ?Additional Information ?SS# [id number] ? ?Georgiann Neider, LCSW ? ? ? ? ?

## 2022-03-04 NOTE — Consult Note (Addendum)
ANTICOAGULATION CONSULT NOTE  ? ?Pharmacy Consult for Apixaban  ?Indication: pulmonary embolus ? ?No Known Allergies ? ?Patient Measurements: ?Height: 5\' 2"  (157.5 cm) ?Weight: 75.8 kg (167 lb) ?IBW/kg (Calculated) : 50.1 ?Heparin Dosing Weight: 66.6 kg  ? ?Vital Signs: ?Temp: 98.4 ?F (36.9 ?C) (03/26 0404) ?BP: 99/59 (03/26 0404) ?Pulse Rate: 97 (03/26 0404) ? ?Labs: ?Recent Labs  ?  03/01/22 ?1451 03/01/22 ?1935 03/02/22 ?[id number] 03/02/22 ?[id number] 03/02/22 ?1713 03/03/22 ?0432 03/04/22 ?[id number]  ?HGB  --   --  6.9*   < > 8.8* 8.0* 8.0*  ?HCT  --   --  22.5*  --   --  25.5* 25.7*  ?PLT  --   --  128*  --   --  136* 123*  ?APTT  --  29  --   --   --   --   --   ?LABPROT  --  16.5*  --   --   --   --   --   ?INR  --  1.3*  --   --   --   --   --   ?HEPARINUNFRC   < >  --  0.34  --  0.Whitney 0.44 0.41  ?CREATININE  --   --  0.48  --   --  0.54 0.61  ? < > = values in this interval not displayed.  ? ? ? ?Estimated Creatinine Clearance: 53.5 mL/min (by C-G formula based on SCr of 0.61 mg/dL). ? ? ?Medical History: ?Past Medical History:  ?Diagnosis Date  ? Anemia   ? Arthritis   ? Broken shoulder   ? left humerus   ? COVID-19   ? Depression   ? Diabetes mellitus without complication ([place])   ? Goiter   ? Hyperlipidemia   ? IDA (iron deficiency anemia) 01/11/2022  ? Overactive bladder   ? Parkinson's disease ([place])   ? Pneumonia   ? Right arm weakness   ? limited movement  ? Unsteady gait   ? ? ?Medications:  ?No prior anticoagulation identified on chart review  ? ?Assessment: ?81 y.o. Whitney [name] who presents to the ED for evaluation of recurrent emesis and falls. Chest CT revealed acute right greater than left bilateral pulmonary emboli w/ RV/LV ratio = 1.1. Pharmacy consulted for transition from heparin to apixaban. ? ?3/24 0625 HL 0.34  ?3/24 1713 HL 0.Whitney ?3/25 0432 HL 0.44, therapeutic x 3 ?3/26 0553 HL 0.41, therapeutic x 4 ? ?3/26: transitioning from Heparin infusion to Apixaban ? ?Goal of Therapy:  ?Heparin level 0.3-0.7  units/ml ?Monitor platelets by anticoagulation protocol: Yes ?  ?Plan: D/C heparin gtt and give apixaban ?Start apixaban 10 mg BID x 7 days followed by 5 mg BID thereafter.  ?CBC every 3 days ? ? ?Pearla Dubonnet, PharmD ?Clinical Pharmacist ?03/04/2022 ?8:57 AM  ? ?

## 2022-03-04 NOTE — TOC Progression Note (Signed)
Transition of Care (TOC) - Progression Note  ? ? ?Patient Details  ?Name: Whitney [name] ?MRN: [id number] ?Date of Birth: 07-19-1941 ? ?Transition of Care (TOC) CM/SW Contact  ?Cayle Cordoba, LCSW ?Phone Number: ?03/04/2022, 3:26 PM ? ?Clinical Narrative:    ? ?CSW spoke with patient. CSW explained SNF placement process. Preferred is [facility] but understands she may need to choose another placement if [facility] does not have availability. SNF search started. ? ?Expected Discharge Plan: [place] ?Barriers to Discharge: Continued Medical Work up ? ?Expected Discharge Plan and Services ?Expected Discharge Plan: [place] ?  ?  ?  ?Living arrangements for the past 2 months: [place] ?                ?  ?  ?  ?  ?  ?  ?  ?  ?  ?  ? ? ?Social Determinants of Health (SDOH) Interventions ?  ? ?Readmission Risk Interventions ? ?  03/02/2022  ?  1:31 PM  ?Readmission Risk Prevention Plan  ?Transportation Screening Complete  ?PCP or Specialist Appt within 3-5 Days Complete  ?[place] or Home Care Consult Complete  ?Social Work Consult for [place] Planning/Counseling Complete  ?Palliative Care Screening Not Applicable  ?Medication Review Press photographer) Complete  ? ? ?

## 2022-03-05 DIAGNOSIS — K7469 Other cirrhosis of liver: Secondary | ICD-10-CM | POA: Diagnosis not present

## 2022-03-05 DIAGNOSIS — I2699 Other pulmonary embolism without acute cor pulmonale: Secondary | ICD-10-CM | POA: Diagnosis not present

## 2022-03-05 DIAGNOSIS — D649 Anemia, unspecified: Secondary | ICD-10-CM | POA: Diagnosis not present

## 2022-03-05 LAB — CBC
HCT: 27.2 % — ABNORMAL LOW (ref 36.0–46.0)
Hemoglobin: 8.2 g/dL — ABNORMAL LOW (ref 12.0–15.0)
MCH: 27.8 pg (ref 26.0–34.0)
MCHC: 30.1 g/dL (ref 30.0–36.0)
MCV: 92.2 fL (ref 80.0–100.0)
Platelets: 128 10*3/uL — ABNORMAL LOW (ref 150–400)
RBC: 2.95 MIL/uL — ABNORMAL LOW (ref 3.87–5.11)
RDW: 22.2 % — ABNORMAL HIGH (ref 11.5–15.5)
WBC: 3.3 10*3/uL — ABNORMAL LOW (ref 4.0–10.5)
nRBC: 0 % (ref 0.0–0.2)

## 2022-03-05 LAB — GLUCOSE, CAPILLARY
Glucose-Capillary: 138 mg/dL — ABNORMAL HIGH (ref 70–99)
Glucose-Capillary: 201 mg/dL — ABNORMAL HIGH (ref 70–99)
Glucose-Capillary: 168 mg/dL — ABNORMAL HIGH (ref 70–99)
Glucose-Capillary: 152 mg/dL — ABNORMAL HIGH (ref 70–99)

## 2022-03-05 MED ORDER — MIRABEGRON ER 25 MG PO TB24
25.0000 mg | ORAL_TABLET | Freq: Every day | ORAL | Status: DC
Start: 2022-03-05 — End: 2022-03-06
  Administered 2022-03-05 – 2022-03-06 (×2): 25 mg via ORAL
  Filled 2022-03-05 (×2): qty 1

## 2022-03-05 MED ORDER — LACTULOSE 10 GM/15ML PO SOLN
20.0000 g | Freq: Two times a day (BID) | ORAL | Status: DC
  Administered 2022-03-06: 20 g via ORAL
  Filled 2022-03-05: qty 30

## 2022-03-05 NOTE — TOC Progression Note (Addendum)
Transition of Care (TOC) - Progression Note  ? ? ?Patient Details  ?Name: Whitney [name] ?MRN: [id number] ?Date of Birth: 1941-07-09 ? ?Transition of Care (TOC) CM/SW Contact  ?Alberteen Sam, LCSW ?Phone Number: ?03/05/2022, 10:25 AM ? ?Clinical Narrative:    ? ?[facility] for [facility] accepted, insurance auth has been started with [employer].  ? ?Daughter [name] has been updated on above. LVM no answer. ? ? ?Expected Discharge Plan: [place] ?Barriers to Discharge: Continued Medical Work up ? ?Expected Discharge Plan and Services ?Expected Discharge Plan: [place] ?  ?  ?  ?Living arrangements for the past 2 months: [place] ?                ?  ?  ?  ?  ?  ?  ?  ?  ?  ?  ? ? ?Social Determinants of Health (SDOH) Interventions ?  ? ?Readmission Risk Interventions ? ?  03/02/2022  ?  1:31 PM  ?Readmission Risk Prevention Plan  ?Transportation Screening Complete  ?PCP or Specialist Appt within 3-5 Days Complete  ?[place] or Home Care Consult Complete  ?Social Work Consult for [place] Planning/Counseling Complete  ?Palliative Care Screening Not Applicable  ?Medication Review Press photographer) Complete  ? ? ?

## 2022-03-05 NOTE — Care Management Important Message (Signed)
Important Message ? ?Patient Details  ?Name: Whitney [name] ?MRN: [id number] ?Date of Birth: 1941-06-09 ? ? ?Medicare Important Message Given:  Yes ? ? ? ? ?Johnell Comings ?03/05/2022, 12:23 PM ?

## 2022-03-05 NOTE — Consult Note (Signed)
Patient counseled on apixaban. Gave 30-day free coupon. Patient states she goes to [place] for prescriptions, when we ran her insurance the co-pay was 40 dollars. Pt is aware of the co-pay.  ? ?Thanks,  ?Paschal Dopp, PharmD, BCPS ?

## 2022-03-05 NOTE — Progress Notes (Signed)
Physical Therapy Treatment ?Patient Details ?Name: Whitney [name] ?MRN: [id number] ?DOB: 04-06-1941 ?Today's Date: 03/05/2022 ? ? ?History of Present Illness Pt is an 81 y/o F admitted on 03/01/22 after presenting to the ED with c/o N&V & recent fall. CT chest showed bilateral PE with right heart strain, Duplex ultrasound showed a right lower extremity superficial thrombosis. Dr. Roosevelt Locks cleared pt for therapy prior to completing 48 hours of anticoagulation via secure chat on 03/03/22. PMH: Parkinson's disease, NIDDM 2, MDD, hepatic cirrhosis, pulmonary HTN, vitamin D deficiency ? ?  ?PT Comments  ? ? Pt in bed, ready for session.  To EOB with mod a x 1 and cues. Stated trouble with bed mobility at home and sleeps in recliner.  Stood and is able to walk 4' to [facility] with very short shuffling steps and increased time.  Difficulty initiating gait and turns.  She is able to void.  Asks for bath and she does actively assist in care as able.  No BM noted.  She stands and is able to turn to recliner but fatigues quickly and is unable to walk further in room.  Sits quickly in chair.  Remains up with needs met.   ? ?SNF remains appropriate for transition.  She is unsafe to transfer without assist and is unable to walk even short distances in room safely. ?  ?Recommendations for follow up therapy are one component of a multi-disciplinary discharge planning process, led by the attending physician.  Recommendations may be updated based on patient status, additional functional criteria and insurance authorization. ? ?Follow Up Recommendations ? Skilled nursing-short term rehab (<3 hours/day) ?  ?  ?Assistance Recommended at Discharge Frequent or constant Supervision/Assistance  ?Patient can return home with the following A lot of help with walking and/or transfers;A lot of help with bathing/dressing/bathroom;Assistance with cooking/housework;Direct supervision/assist for financial management;Assist for transportation;Help with stairs or  ramp for entrance;Direct supervision/assist for medications management ?  ?Equipment Recommendations ? None recommended by PT  ?  ?Recommendations for Other Services   ? ? ?  ?Precautions / Restrictions Precautions ?Precautions: Fall ?Restrictions ?Weight Bearing Restrictions: No  ?  ? ?Mobility ? Bed Mobility ?Overal bed mobility: Needs Assistance ?Bed Mobility: Supine to Sit ?  ?  ?Supine to sit: Mod assist, HOB elevated ?  ?  ?General bed mobility comments: stated she sleeps in lift chair at home due to difficult bed mobility prior. ?  ? ?Transfers ?Overall transfer level: Needs assistance ?Equipment used: Rolling walker (2 wheels) ?Transfers: Sit to/from Stand, Bed to chair/wheelchair/BSC ?Sit to Stand: Min guard ?  ?Step pivot transfers: Min assist ?  ?  ?  ?  ?  ? ?Ambulation/Gait ?Ambulation/Gait assistance: Min assist ?Gait Distance (Feet): 3 Feet ?Assistive device: Rolling walker (2 wheels) ?Gait Pattern/deviations: Decreased step length - left, Decreased dorsiflexion - right, Decreased dorsiflexion - left, Decreased stride length, Decreased step length - right, [place] ?Gait velocity: decreased ?  ?  ?General Gait Details: very short shuffling steps, generally unsteady.  unable to walk further than short transfer distances due to fatigue. ? ? ?Stairs ?  ?  ?  ?  ?  ? ? ?Wheelchair Mobility ?  ? ?Modified Rankin (Stroke Patients Only) ?  ? ? ?  ?Balance Overall balance assessment: Needs assistance ?Sitting-balance support: Single extremity supported, No upper extremity supported, Feet supported ?Sitting balance-Leahy Scale: Fair ?  ?  ?Standing balance support: During functional activity, Reliant on assistive device for balance, Bilateral upper extremity supported ?Standing  balance-Leahy Scale: Poor ?  ?  ?  ?  ?  ?  ?  ?  ?  ?  ?  ?  ?  ? ?  ?Cognition Arousal/Alertness: Awake/alert ?Behavior During Therapy: [facility] for tasks assessed/performed ?Overall Cognitive Status: Within Functional Limits for tasks  assessed ?  ?  ?  ?  ?  ?  ?  ?  ?  ?  ?  ?  ?  ?  ?  ?  ?  ?  ?  ? ?  ?Exercises Other Exercises ?Other Exercises: BSC to void and to actively participate in bathing. ? ?  ?General Comments   ?  ?  ? ?Pertinent Vitals/Pain Pain Assessment ?Pain Assessment: No/denies pain  ? ? ?Home Living   ?  ?  ?  ?  ?  ?  ?  ?  ?  ?   ?  ?Prior Function    ?  ?  ?   ? ?PT Goals (current goals can now be found in the care plan section) Progress towards PT goals: Progressing toward goals ? ?  ?Frequency ? ? ? Min 2X/week ? ? ? ?  ?PT Plan Current plan remains appropriate  ? ? ?Co-evaluation   ?  ?  ?  ?  ? ?  ?AM-PAC PT "6 Clicks" Mobility   ?Outcome Measure ? Help needed turning from your back to your side while in a flat bed without using bedrails?: A Little ?Help needed moving from lying on your back to sitting on the side of a flat bed without using bedrails?: A Lot ?Help needed moving to and from a bed to a chair (including a wheelchair)?: A Little ?Help needed standing up from a chair using your arms (e.g., wheelchair or bedside chair)?: A Little ?Help needed to walk in hospital room?: A Lot ?Help needed climbing 3-5 steps with a railing? : Total ?6 Click Score: 14 ? ?  ?End of Session Equipment Utilized During Treatment: Gait belt ?Activity Tolerance: Patient tolerated treatment well ?Patient left: in chair;with chair alarm set;with call bell/phone within reach ?Nurse Communication: Mobility status ?PT Visit Diagnosis: Unsteadiness on feet (R26.81);Muscle weakness (generalized) (M62.81);Difficulty in walking, not elsewhere classified (R26.2);Other abnormalities of gait and mobility (R26.89) ?  ? ? ?Time: 4287-6811 ?PT Time Calculation (min) (ACUTE ONLY): 25 min ? ?Charges:  $Gait Training: 8-22 mins ?$Therapeutic Activity: 8-22 mins          ?         Chesley Noon, PTA ?03/05/22, 11:20 AM ? ?

## 2022-03-05 NOTE — Progress Notes (Signed)
?Progress Note ? ? ?Patient: Whitney [name] [id number] DOB: 1941/01/20 DOA: 03/01/2022     4 ?DOS: the patient was seen and examined on 03/05/2022 ?  ?Brief hospital course: ?81 year old female with past medical history of Parkinson disease, non-insulin-dependent diabetes mellitus type 2, major depressive disorder, hepatic cirrhosis (complicated by portal hypertensive gastropathy and grade 2 esophageal varices seen during EGD 12/2021), pulmonary hypertension, vitamin D deficiency who initially presented to [facility] emergency department via EMS with complaints of nausea vomiting and recent fall.  Patient also has been having short of breath for the last few months. ?Her CT chest showed bilateral PE with right heart strain.  Duplex ultrasound showed a right lower extremity superficial thrombosis. ?Patient is treated with IV heparin. ?3/26.  Echocardiogram did not show any right heart strain ejection fraction 65 to 70%.  Anticoagulation changed to Eliquis.  Patient need nursing placement per physical therapy. ? ?Assessment and Plan: ?* Acute pulmonary embolism ([place]) ?Continue Eliquis.  Patient currently pending for nursing home placement. ? ?Lesion of cecum ?On CT scan, there is a thickened mucosa in the cecum concerning for malignancy.  Patient will need colonoscopy eventually.  However, given acute PE, colonoscopy should be delayed. ?Discussed with Dr. Marius Ditch, will schedule outpatient colonoscopy after discharge. ? ?Prolonged QT interval ?Monitor electrolytes. ? ?Type 2 diabetes mellitus with hyperglycemia, without long-term current use of insulin ([place]) ?Glucose relatively controlled, continue sliding scale insulin. ? ? ?Hepatic cirrhosis ([place]) ?No evidence of GI bleed.  Hemoglobin has been stable. ? ?Parkinson's disease ([place]) ?Continue home medicines.   ? ?GERD without esophagitis ?Continue PPI. ? ?Thyroid nodule ?Incidental finding on image study. Recommended follow up ultrasound in 1 year.  ? ?Hypomagnesemia ?Magnesium  level normalized ? ?Chronic idiopathic thrombocytopenia (H[facility]) ?Condition stable/improving ? ?Chronic anemia ?Patient received 1 unit of PRBC and IV iron.  Hemoglobin stable at 8.2 today, no B12 deficiency ? ?Severe constipation. ?No results with 1 dose of the lactulose and MiraLAX.  Will give water enema, increase lactulose to twice a day. ? ? ?  ? ?Subjective:  ?Patient doing well today, still has some short of breath with exertion.  No chest pain.  No cough. ?No fever or chills. ?Has significant constipation, did not have bowel movement after giving lactulose MiraLAX.  She has no abdominal pain or nausea vomiting ? ?Physical Exam: ?Vitals:  ? 03/04/22 1942 03/05/22 0011 03/05/22 0406 03/05/22 0751  ?BP: 128/65 (!) 96/55 (!) 100/55 (!) 98/51  ?Pulse: (!) 101 93 93 96  ?Resp: 18 14 18 17   ?Temp: 98.5 ?F (36.9 ?C)  98.4 ?F (36.9 ?C) 98.1 ?F (36.7 ?C)  ?TempSrc: Oral Oral Oral Oral  ?SpO2: 98% 98% 96% 95%  ?Weight:      ?Height:      ? ?General exam: Appears calm and comfortable  ?Respiratory system: Clear to auscultation. Respiratory effort normal. ?Cardiovascular system: S1 & S2 heard, RRR. No JVD, murmurs, rubs, gallops or clicks. No pedal edema. ?Gastrointestinal system: Abdomen is nondistended, soft and nontender. No organomegaly or masses felt. Normal bowel sounds heard. ?Central nervous system: Alert and oriented. No focal neurological deficits. ?Extremities: Symmetric 5 x 5 power. ?Skin: No rashes, lesions or ulcers ?Psychiatry: Judgement and insight appear normal. Mood & affect appropriate.  ? ?Data Reviewed: ? ?Lab results reviewed ? ?Family Communication: Family not available today ? ?Disposition: ?Status is: Inpatient ?Remains inpatient appropriate because: Unsafe discharge, pending nursing home placement.  Patient also had severe constipation. ? Planned Discharge Destination: Skilled nursing facility ? ? ? ?  Time spent: 28 minutes ? ?Author: Sharen Hones, MD ?03/05/2022 10:27 AM ? ?For on call review  [email or web address].  ?

## 2022-03-06 DIAGNOSIS — D649 Anemia, unspecified: Secondary | ICD-10-CM | POA: Diagnosis not present

## 2022-03-06 DIAGNOSIS — K7469 Other cirrhosis of liver: Secondary | ICD-10-CM | POA: Diagnosis not present

## 2022-03-06 DIAGNOSIS — I2699 Other pulmonary embolism without acute cor pulmonale: Secondary | ICD-10-CM | POA: Diagnosis not present

## 2022-03-06 LAB — GLUCOSE, CAPILLARY
Glucose-Capillary: 154 mg/dL — ABNORMAL HIGH (ref 70–99)
Glucose-Capillary: 160 mg/dL — ABNORMAL HIGH (ref 70–99)

## 2022-03-06 MED ORDER — APIXABAN 5 MG PO TABS: 10.0000 mg | ORAL_TABLET | Freq: Two times a day (BID) | ORAL | Status: DC

## 2022-03-06 MED ORDER — APIXABAN 5 MG PO TABS: 5.0000 mg | ORAL_TABLET | Freq: Two times a day (BID) | ORAL | 0 refills | Status: DC

## 2022-03-06 MED ORDER — POLYETHYLENE GLYCOL 3350 17 G PO PACK: 34.0000 g | PACK | Freq: Every day | ORAL | 0 refills | Status: DC

## 2022-03-06 NOTE — Progress Notes (Signed)
Patient Ivs removed and provided DC instructions by charge RN. Report provided to [facility] at [facility]. Patient verbalized understanding and contacted family re discharge and informed them. Spoke with patient sister who is aware of discharge to [facility]. Patient vital signs stable. Patient assisted from chair to stretcher by RN, EMS and NT without complication. All belongings with sent with patient.  ?

## 2022-03-06 NOTE — Discharge Summary (Signed)
?Physician Discharge Summary ?  ?Patient: Whitney [name] MRN: [id number] DOB: 1941/04/22  ?Admit date:     03/01/2022  ?Discharge date: 03/06/22  ?Discharge Physician: Marrion Coy  ? ?PCP: Marguarite Arbour, MD  ? ?Recommendations at discharge:  ? ?Follow-up with PCP in 1 week. ?Follow-up with GI for colonoscopy in 2 weeks. ?Repeat CT scan of thyroid nodule in 1 year. ? ?Discharge Diagnoses: ?Principal Problem: ?  Acute pulmonary embolism (HCC) ?Active Problems: ?  Lesion of cecum ?  Prolonged QT interval ?  Type 2 diabetes mellitus with hyperglycemia, without long-term current use of insulin (HCC) ?  Hepatic cirrhosis (HCC) ?  Parkinson's disease (HCC) ?  GERD without esophagitis ?  Chronic anemia ?  Chronic idiopathic thrombocytopenia (HCC) ?  Hypomagnesemia ?  Thyroid nodule ? ?Resolved Problems: ?  * No resolved hospital problems. * ? ?Hospital Course: ?81 year old female with past medical history of Parkinson disease, non-insulin-dependent diabetes mellitus type 2, major depressive disorder, hepatic cirrhosis (complicated by portal hypertensive gastropathy and grade 2 esophageal varices seen during EGD 12/2021), pulmonary hypertension, vitamin D deficiency who initially presented to [facility] emergency department via EMS with complaints of nausea vomiting and recent fall.  Patient also has been having short of breath for the last few months. ?Her CT chest showed bilateral PE with right heart strain.  Duplex ultrasound showed a right lower extremity superficial thrombosis. ?Patient is treated with IV heparin. ?3/26.  Echocardiogram did not show any right heart strain ejection fraction 65 to 70%.  Anticoagulation changed to Eliquis.  Patient need nursing placement per physical therapy. ? ?Assessment and Plan: ?* Acute pulmonary embolism (HCC) ?Patient initially treated with heparin drip, changed to Eliquis. ?Due to unprovoked and recurrent VTE, anticoagulation should be continued indefinitely. ? ?Lesion of cecum ?On CT  scan, there is a thickened mucosa in the cecum concerning for malignancy.  Patient will need colonoscopy eventually.  However, given acute PE, colonoscopy should be delayed. ?Discussed with Dr. Allegra Lai, will schedule outpatient colonoscopy after discharge. ? ?Prolonged QT interval ?Follow-up with PCP. ? ?Type 2 diabetes mellitus with hyperglycemia, without long-term current use of insulin (HCC) ?Resume home regimen. ? ? ?Hepatic cirrhosis (HCC) ?No evidence of GI bleed.  Hemoglobin has been stable.  Currently on Protonix twice a day. ? ?Parkinson's disease (HCC) ?Continue home medicines.   ? ?GERD without esophagitis ?Continue PPI. ? ?Thyroid nodule ?Incidental finding on image study. Recommended follow up ultrasound in 1 year.  ? ?Hypomagnesemia ?Magnesium level normalized ? ?Chronic idiopathic thrombocytopenia (HCC) ?Condition stable/improving ? ?Chronic anemia ?Patient received 1 unit of PRBC and IV iron.  Resume home dose of B12 and iron treatment. ? ? ? ? ?  ? ? ?Consultants: GI ?Procedures performed: None  ?Disposition: Skilled nursing facility ?Diet recommendation:  ?Discharge Diet Orders (From admission, onward)  ? ?  Start     Ordered  ? 03/06/22 0000  Diet - low sodium heart healthy       ? 03/06/22 0956  ? ?  ?  ? ?  ? ?Cardiac diet ?DISCHARGE MEDICATION: ?Allergies as of 03/06/2022   ?No Known Allergies ?  ? ?  ?Medication List  ?  ? ?STOP taking these medications   ? ?HYDROcodone-acetaminophen 5-325 MG tablet ?Commonly known as: NORCO/VICODIN ?  ? ?  ? ?TAKE these medications   ? ?acetaminophen 500 MG tablet ?Commonly known as: TYLENOL ?Take 500-1,000 mg by mouth every 6 (six) hours as needed (for pain.). ?  ?amantadine 100 MG  capsule ?Commonly known as: SYMMETREL ?Take 100 mg by mouth 2 (two) times daily. ?  ?apixaban 5 MG Tabs tablet ?Commonly known as: ELIQUIS ?Take 2 tablets (10 mg total) by mouth 2 (two) times daily for 5 days. ?  ?apixaban 5 MG Tabs tablet ?Commonly known as: ELIQUIS ?Take 1 tablet  (5 mg total) by mouth 2 (two) times daily. ?Start taking on: March 11, 2022 ?  ?buPROPion 150 MG 24 hr tablet ?Commonly known as: WELLBUTRIN XL ?TAKE 1 TABLET(150 MG) BY MOUTH DAILY ?  ?cholecalciferol 25 MCG (1000 UNIT) tablet ?Commonly known as: VITAMIN D3 ?Take 1,000 Units by mouth daily. ?  ?ferrous sulfate 325 (65 FE) MG tablet ?Take 325 mg by mouth daily with breakfast. ?  ?furosemide 40 MG tablet ?Commonly known as: LASIX ?Take 40 mg by mouth daily as needed. ?  ?lactulose 10 GM/15ML solution ?Commonly known as: CHRONULAC ?Take 15 mLs (10 g total) by mouth daily. ?  ?lidocaine 5 % ?Commonly known as: Lidoderm ?Place 1 patch onto the skin every 12 (twelve) hours. Remove & Discard patch within 12 hours or as directed by MD ?  ?metFORMIN 500 MG 24 hr tablet ?Commonly known as: GLUCOPHAGE-XR ?Take 1 tablet (500 mg total) by mouth 2 (two) times daily with a meal. TAKE 1 TABLET(500 MG) BY MOUTH BID ?What changed: when to take this ?  ?methocarbamol 500 MG tablet ?Commonly known as: Robaxin ?Take 1 tablet (500 mg total) by mouth every 8 (eight) hours as needed for muscle spasms. ?  ?Myrbetriq 50 MG Tb24 tablet ?Generic drug: mirabegron ER ?TAKE 1 TABLET(50 MG) BY MOUTH DAILY ?  ?pantoprazole 40 MG tablet ?Commonly known as: PROTONIX ?Take 1 tablet (40 mg total) by mouth daily. ?  ?polyethylene glycol 17 g packet ?Commonly known as: MIRALAX / GLYCOLAX ?Take 34 g by mouth daily. ?  ?Trospium Chloride 60 MG Cp24 ?TAKE 1 CAPSULE(60 MG) BY MOUTH DAILY ?  ?vitamin B-12 500 MCG tablet ?Commonly known as: CYANOCOBALAMIN ?Take 500 mcg by mouth daily. ?  ? ?  ? ? Follow-up Information   ? ? Marguarite ArbourSparks, Jeffrey D, MD Follow up in 1 week(s).   ?Specialty: Internal Medicine ?Contact information: ?[address] ?[facility] ?[place] [address] ?[phone] ? ? ?  ?  ? ? Toney ReilVanga, Rohini Reddy, MD Follow up in 2 week(s).   ?Specialty: Gastroenterology ?Contact information: ?[address] ?[place]  [id number] ?[phone] ? ? ?  ?  ? ?  ?  ? ?  ? ?Discharge Exam: ?Ceasar MonsFiled Weights  ? 03/01/22 1447  ?Weight: 75.8 kg  ? ?General exam: Appears calm and comfortable  ?Respiratory system: Clear to auscultation. Respiratory effort normal. ?Cardiovascular system: S1 & S2 heard, RRR. No JVD, murmurs, rubs, gallops or clicks. No pedal edema. ?Gastrointestinal system: Abdomen is nondistended, soft and nontender. No organomegaly or masses felt. Normal bowel sounds heard. ?Central nervous system: Alert and oriented. No focal neurological deficits. ?Extremities: Symmetric 5 x 5 power. ?Skin: No rashes, lesions or ulcers ?Psychiatry: Judgement and insight appear normal. Mood & affect appropriate.  ? ? ?Condition at discharge: good ? ?The results of significant diagnostics from this hospitalization (including imaging, microbiology, ancillary and laboratory) are listed below for reference.  ? ?Imaging Studies: ?CT HEAD WO CONTRAST (5MM) ? ?Result Date: 02/09/2022 ?CLINICAL DATA:  Trauma, fall EXAM: CT HEAD WITHOUT CONTRAST TECHNIQUE: Contiguous axial images were obtained from the base of the skull through the vertex without intravenous contrast. RADIATION DOSE  REDUCTION: This exam was performed according to the departmental dose-optimization program which includes automated exposure control, adjustment of the mA and/or kV according to patient size and/or use of iterative reconstruction technique. COMPARISON:  07/20/2021 FINDINGS: Brain: No acute intracranial findings are seen. There are no signs of bleeding. Cortical sulci are prominent. There is decreased density in the periventricular white matter. Vascular: Scattered arterial calcifications are seen. Skull: Unremarkable. Sinuses/Orbits: There is mild mucosal thickening in the ethmoid and sphenoid sinuses. Other: None IMPRESSION: No acute intracranial findings are seen in noncontrast CT brain. Atrophy. Small-vessel disease. Electronically Signed   By: Ernie Avena M.D.   On:  02/09/2022 12:06  ? ?CT Angio Chest PE W and/or Wo Contrast ? ?Result Date: 03/01/2022 ?CLINICAL DATA:  Emesis and fall EXAM: CT ANGIOGRAPHY CHEST WITH CONTRAST TECHNIQUE: Multidetector CT imaging of the chest was p

## 2022-03-06 NOTE — TOC Transition Note (Addendum)
Transition of Care (TOC) - CM/SW Discharge Note ? ? ?Patient Details  ?Name: Whitney [name] ?MRN: [id number] ?Date of Birth: 1941-11-27 ? ?Transition of Care (TOC) CM/SW Contact:  ?Alberteen Sam, LCSW ?Phone Number: ?03/06/2022, 9:55 AM ? ? ?Clinical Narrative:    ? ?[facility] to[facility] ?Anticipated DC date: 03/06/22 ?Family notified: Cheryl - lvm ?Transport by: [facility] ? ?Per MD patient ready for DC to [facility] . RN, patient, patient's family, and facility notified of DC. Discharge Summary sent to facility. RN given number for report   [phone] Room 809. DC packet on chart. Ambulance transport requested for patient.  ?CSW signing off. ? ?Pricilla Riffle, LCSW ? ? ? ?Final next level of care: [place] ?Barriers to Discharge: No Barriers Identified ? ? ?Patient Goals and CMS Choice ?Patient states their goals for this hospitalization and ongoing recovery are:: to go home ?CMS Medicare.gov Compare Post Acute Care list provided to:: Patient Represenative (must comment) [name] POA) ?Choice offered to / list presented to : Adult Children ? ?Discharge Placement ?  ?           ?Patient chooses bed at: [facility] [place] ?Patient to be transferred to facility by: [facility] ?Name of family member notified: [name] ?Patient and family notified of of transfer: 03/06/22 ? ?Discharge Plan and Services ?  ?  ?           ?  ?  ?  ?  ?  ?  ?  ?  ?  ?  ? ?Social Determinants of Health (SDOH) Interventions ?  ? ? ?Readmission Risk Interventions ? ?  03/02/2022  ?  1:31 PM  ?Readmission Risk Prevention Plan  ?Transportation Screening Complete  ?PCP or Specialist Appt within 3-5 Days Complete  ?[place] or Home Care Consult Complete  ?Social Work Consult for [place] Planning/Counseling Complete  ?Palliative Care Screening Not Applicable  ?Medication Review Press photographer) Complete  ? ? ? ? ? ?

## 2022-03-06 NOTE — Progress Notes (Deleted)
Patient Ivs removed and provided DC instructions by charge RN. Patient verbalized understanding and contacted family re discharge and informed them. Spoke with patient sister who is aware of discharge to [facility]. Patient vital signs stable. Patient assisted from chair to stretcher by RN, EMS and NT without complication. All belongings with sent with patient.  ?

## 2022-03-06 NOTE — Progress Notes (Signed)
PT Cancellation Note ? ?Patient Details ?Name: Whitney [name] ?MRN: [id number] ?DOB: 12-20-1940 ? ? ?Cancelled Treatment:    Reason Eval/Treat Not Completed: Other (comment).  Chart reviewed and attempted to see pt.  Pt currently with nursing on the [facility], however has plans of d/c today and requesting to not be seen for therapy.  Will continue to monitor throughout hospital stay. ? ? ?Gwenlyn Saran, PT, DPT ?03/06/22, 12:25 PM ? ?

## 2022-03-29 ENCOUNTER — Encounter: Payer: Self-pay | Admitting: Gastroenterology

## 2022-03-29 ENCOUNTER — Ambulatory Visit (INDEPENDENT_AMBULATORY_CARE_PROVIDER_SITE_OTHER): Payer: Medicare PPO | Admitting: Gastroenterology

## 2022-03-29 ENCOUNTER — Other Ambulatory Visit: Payer: Self-pay

## 2022-03-29 VITALS — BP 118/72 | HR 106 | Temp 97.8°F | Ht 62.0 in

## 2022-03-29 DIAGNOSIS — D509 Iron deficiency anemia, unspecified: Secondary | ICD-10-CM

## 2022-03-29 NOTE — Progress Notes (Signed)
?  ?Whitney Darby, MD ?[address]  ?[address]  ?[place], [place] [address]  ?Main: [phone]  ?Fax: [phone] ? ? ? ?Gastroenterology Consultation ? ?Referring Provider:     Idelle Crouch, MD ?Primary Care Physician:  Idelle Crouch, MD ?Primary Gastroenterologist:  Dr. Alice Reichert ?Reason for Consultation: Hospital follow-up, ?colon mass ?      ? HPI:   ?Whitney [name] is a 81 y.o. female referred by Dr. Doy Hutching, Leonie Douglas, MD  for consultation & management of recent hospitalization for shortness of breath and generalized weakness secondary to severe symptomatic iron deficiency anemia, as well as new diagnosis of bilateral pulmonary emboli with evidence of right heart strain.  Patient underwent CT abdomen and pelvis with contrast, was incidentally found to have thickening of the cecum concerning for a mass.  Patient was discharged to rehab on Eliquis.  Patient is today accompanied by her daughter.  Patient is taking oral iron daily.  She denies any abdominal pain, bloating, rectal bleeding, nausea or vomiting or melena.  Patient has an appointment to see hematologist, Dr. Tasia Catchings on 5/4 ?Patient is accompanied by her daughter who reports that she will be moving to senior citizen facility soon.  Patient is wheelchair-bound secondary to severe bilateral lower extremity weakness ? ?NSAIDs: None ? ?Antiplts/Anticoagulants/Anti thrombotics: Eliquis for history of PE ? ?GI Procedures:  ?Reported history of undergoing colonoscopy about 5 years ago in [place], [place] and reportedly it was normal.  Has had other colonoscopies prior to that as well, according to the patient, which were normal.  None of her previous procedure reports are available to [place]. ?  ?Upper endoscopy 12/27/2021 ?- Grade II esophageal varices. ?- Gastritis. ?- Portal hypertensive gastropathy. ?- Normal examined duodenum. ?- 1 cm hiatal hernia. ?- The examination was otherwise normal. ?- No specimens collected. ? ?Past Medical History:   ?Diagnosis Date  ? Anemia   ? Arthritis   ? Broken shoulder   ? left humerus   ? COVID-19   ? Depression   ? Diabetes mellitus without complication ([place])   ? Goiter   ? Hyperlipidemia   ? IDA (iron deficiency anemia) 01/11/2022  ? Overactive bladder   ? Parkinson's disease ([place])   ? Pneumonia   ? Right arm weakness   ? limited movement  ? Unsteady gait   ? ? ?Past Surgical History:  ?Procedure Laterality Date  ? ABDOMINAL HYSTERECTOMY  1980  ? BLADDER REPAIR  1980  ? post hysterectomy  ? ESOPHAGOGASTRODUODENOSCOPY N/A 12/27/2021  ? Procedure: ESOPHAGOGASTRODUODENOSCOPY (EGD);  Surgeon: Toledo, Benay Pike, MD;  Location: [facility] ENDOSCOPY;  Service: Gastroenterology;  Laterality: N/A;  ? NECK SURGERY    ? anterior cervical disc fusion  ? THYROIDECTOMY Left 08/03/2020  ? Procedure: THYROIDECTOMY-HEMI;  Surgeon: Clyde Canterbury, MD;  Location: [facility] ORS;  Service: ENT;  Laterality: Left;  ? ? ?Current Outpatient Medications:  ?  acetaminophen (TYLENOL) 500 MG tablet, Take 500-1,000 mg by mouth every 6 (six) hours as needed (for pain.)., Disp: , Rfl:  ?  apixaban (ELIQUIS) 5 MG TABS tablet, Take 1 tablet (5 mg total) by mouth 2 (two) times daily., Disp: 60 tablet, Rfl: 0 ?  buPROPion (WELLBUTRIN XL) 150 MG 24 hr tablet, Take 1 tablet by mouth every morning., Disp: , Rfl:  ?  cholecalciferol (VITAMIN D3) 25 MCG (1000 UNIT) tablet, Take 1,000 Units by mouth daily. , Disp: , Rfl:  ?  ferrous sulfate 325 (65 FE) MG tablet, Take  325 mg by mouth daily with breakfast. , Disp: , Rfl:  ?  lidocaine (LIDODERM) 5 %, Place 1 patch onto the skin every 12 (twelve) hours. Remove & Discard patch within 12 hours or as directed by MD, Disp: 10 patch, Rfl: 0 ?  metFORMIN (GLUCOPHAGE-XR) 500 MG 24 hr tablet, Take 2 tablets by mouth 2 (two) times daily., Disp: , Rfl:  ?  methocarbamol (ROBAXIN) 500 MG tablet, Take 1 tablet (500 mg total) by mouth every 8 (eight) hours as needed for muscle spasms., Disp: 25 tablet, Rfl: 0 ?  MYRBETRIQ 50 MG TB24  tablet, TAKE 1 TABLET(50 MG) BY MOUTH DAILY, Disp: 30 tablet, Rfl: 11 ?  pantoprazole (PROTONIX) 40 MG tablet, Take 1 tablet (40 mg total) by mouth daily., Disp: 30 tablet, Rfl: 0 ?  triamcinolone cream (KENALOG) 0.1 %, Apply topically 2 (two) times daily., Disp: , Rfl:  ?  Trospium Chloride 60 MG CP24, TAKE 1 CAPSULE(60 MG) BY MOUTH DAILY, Disp: 90 capsule, Rfl: 3 ?  vitamin B-12 (CYANOCOBALAMIN) 500 MCG tablet, Take 500 mcg by mouth daily., Disp: , Rfl:  ?  amantadine (SYMMETREL) 100 MG capsule, Take 100 mg by mouth 2 (two) times daily., Disp: , Rfl:  ?  apixaban (ELIQUIS) 5 MG TABS tablet, Take 2 tablets (10 mg total) by mouth 2 (two) times daily for 5 days., Disp: 60 tablet, Rfl:  ?  furosemide (LASIX) 40 MG tablet, Take 40 mg by mouth daily as needed. (Patient not taking: Reported on 03/29/2022), Disp: , Rfl:  ?  lactulose (CHRONULAC) 10 GM/15ML solution, Take 15 mLs (10 g total) by mouth daily. (Patient not taking: Reported on 03/29/2022), Disp: 946 mL, Rfl: 0 ?  metolazone (ZAROXOLYN) 2.5 MG tablet, Take 2.5 mg by mouth daily., Disp: , Rfl:  ?  polyethylene glycol (MIRALAX / GLYCOLAX) 17 g packet, Take 34 g by mouth daily. (Patient not taking: Reported on 03/29/2022), Disp: 14 each, Rfl: 0 ? ?Family History  ?Problem Relation Age of Onset  ? Stroke Mother   ? Hypertension Mother   ? Diabetes Father   ? Heart disease Father   ? Lung cancer Father   ? Colon cancer Sister   ? Diabetes Sister   ? Kidney disease Daughter   ? Kidney cancer Daughter   ? Pulmonary embolism Sister   ? Diabetes Sister   ? Kidney disease Sister   ? Heart disease Sister   ? Atrial fibrillation Sister   ? Diabetes Brother   ? Heart disease Brother   ? AAA (abdominal aortic aneurysm) Brother   ? Cancer Brother   ?     skin  ?  ? ?Social History  ? ?Tobacco Use  ? Smoking status: Former  ?  Packs/day: 2.00  ?  Years: 40.00  ?  Pack years: 80.00  ?  Types: Cigarettes  ?  Quit date: 12/10/2018  ?  Years since quitting: 3.3  ? Smokeless tobacco:  Never  ?Vaping Use  ? Vaping Use: Never used  ?Substance Use Topics  ? Alcohol use: Not Currently  ? Drug use: Never  ? ? ?Allergies as of 03/29/2022  ? (No Known Allergies)  ? ? ?Review of Systems:    ?All systems reviewed and negative except where noted in HPI. ? ? Physical Exam:  ?BP 118/72 (BP Location: Left Arm, Patient Position: Sitting, Cuff Size: Normal)   Pulse (!) 106   Temp 97.8 ?F (36.6 ?C) (Oral)   Ht 5\' 2"  (1.575  m)   BMI 30.54 kg/m?  ?No LMP recorded. Patient has had a hysterectomy. ? ?General:   Alert,  Well-developed, well-nourished, pleasant and cooperative in NAD ?Head:  Normocephalic and atraumatic. ?Eyes:  Sclera clear, no icterus.   Conjunctiva pink. ?Ears:  Normal auditory acuity. ?Nose:  No deformity, discharge, or lesions. ?Mouth:  No deformity or lesions,oropharynx pink & moist. ?Neck:  Supple; no masses or thyromegaly. ?Lungs:  Respirations even and unlabored.  Clear throughout to auscultation.   No wheezes, crackles, or rhonchi. No acute distress. ?Heart:  Regular rate and rhythm; no murmurs, clicks, rubs, or gallops. ?Abdomen:  Normal bowel sounds. Soft, non-tender and non-distended without masses, hepatosplenomegaly or hernias noted.  No guarding or rebound tenderness.   ?Rectal: Not performed ?Msk:  Symmetrical without gross deformities, bilateral lower extremity weakness ?Pulses:  Normal pulses noted. ?Extremities:  No clubbing or edema.  No cyanosis. ?Neurologic:  Alert and oriented x3;  grossly normal neurologically. ?Skin:  Intact without significant lesions or rashes. No jaundice. ?Psych:  Alert and cooperative. Normal mood and affect. ? ?Imaging Studies: ?Reviewed ? ?Assessment and Plan:  ? ?Whitney [name] is a 81 y.o. female with history of metabolic syndrome, Karlene Lineman cirrhosis with esophageal varices, pulmonary hypertension, history of chronic iron deficiency anemia is seen in consultation for incidentally found possible cecum/ascending colon mass/thickening on the CT abdomen  and pelvis during recent hospitalization.  Patient has new diagnosis of acute pulmonary embolism with right heart strain, currently treated with Eliquis ? ??cecum/ascending colon mass/thickening on the CT abdome

## 2022-03-30 LAB — CBC
Hematocrit: 36.1 % (ref 34.0–46.6)
Hemoglobin: 11.6 g/dL (ref 11.1–15.9)
MCH: 28.1 pg (ref 26.6–33.0)
MCHC: 32.1 g/dL (ref 31.5–35.7)
MCV: 87 fL (ref 79–97)
Platelets: 132 10*3/uL — ABNORMAL LOW (ref 150–450)
RBC: 4.13 x10E6/uL (ref 3.77–5.28)
RDW: 16.9 % — ABNORMAL HIGH (ref 11.7–15.4)
WBC: 3.8 10*3/uL (ref 3.4–10.8)

## 2022-03-30 LAB — IRON,TIBC AND FERRITIN PANEL
Ferritin: 118 ng/mL (ref 15–150)
Iron Saturation: 12 % — ABNORMAL LOW (ref 15–55)
Iron: 32 ug/dL (ref 27–139)
Total Iron Binding Capacity: 263 ug/dL (ref 250–450)
UIBC: 231 ug/dL (ref 118–369)

## 2022-04-10 ENCOUNTER — Inpatient Hospital Stay: Payer: Medicare PPO | Attending: Oncology

## 2022-04-10 DIAGNOSIS — D500 Iron deficiency anemia secondary to blood loss (chronic): Secondary | ICD-10-CM | POA: Insufficient documentation

## 2022-04-10 LAB — CBC WITH DIFFERENTIAL/PLATELET
Abs Immature Granulocytes: 0.01 10*3/uL (ref 0.00–0.07)
Basophils Absolute: 0 10*3/uL (ref 0.0–0.1)
Basophils Relative: 1 %
Eosinophils Absolute: 0.1 10*3/uL (ref 0.0–0.5)
Eosinophils Relative: 2 %
HCT: 36.8 % (ref 36.0–46.0)
Hemoglobin: 12 g/dL (ref 12.0–15.0)
Immature Granulocytes: 0 %
Lymphocytes Relative: 29 %
Lymphs Abs: 1.2 10*3/uL (ref 0.7–4.0)
MCH: 28.4 pg (ref 26.0–34.0)
MCHC: 32.6 g/dL (ref 30.0–36.0)
MCV: 87 fL (ref 80.0–100.0)
Monocytes Absolute: 0.4 10*3/uL (ref 0.1–1.0)
Monocytes Relative: 10 %
Neutro Abs: 2.4 10*3/uL (ref 1.7–7.7)
Neutrophils Relative %: 58 %
Platelets: 125 10*3/uL — ABNORMAL LOW (ref 150–400)
RBC: 4.23 MIL/uL (ref 3.87–5.11)
RDW: 17.5 % — ABNORMAL HIGH (ref 11.5–15.5)
WBC: 4.2 10*3/uL (ref 4.0–10.5)
nRBC: 0 % (ref 0.0–0.2)

## 2022-04-10 LAB — IRON AND TIBC
Iron: 65 ug/dL (ref 28–170)
Saturation Ratios: 21 % (ref 10.4–31.8)
TIBC: 314 ug/dL (ref 250–450)
UIBC: 249 ug/dL

## 2022-04-10 LAB — FERRITIN: Ferritin: 59 ng/mL (ref 11–307)

## 2022-04-12 ENCOUNTER — Inpatient Hospital Stay (HOSPITAL_BASED_OUTPATIENT_CLINIC_OR_DEPARTMENT_OTHER): Payer: Medicare PPO | Admitting: Oncology

## 2022-04-12 ENCOUNTER — Inpatient Hospital Stay: Payer: Medicare PPO

## 2022-04-12 ENCOUNTER — Encounter: Payer: Self-pay | Admitting: Oncology

## 2022-04-12 VITALS — BP 118/79 | HR 109 | Temp 97.5°F | Wt 151.2 lb

## 2022-04-12 DIAGNOSIS — D696 Thrombocytopenia, unspecified: Secondary | ICD-10-CM | POA: Diagnosis not present

## 2022-04-12 DIAGNOSIS — D500 Iron deficiency anemia secondary to blood loss (chronic): Secondary | ICD-10-CM

## 2022-04-12 DIAGNOSIS — R161 Splenomegaly, not elsewhere classified: Secondary | ICD-10-CM | POA: Diagnosis not present

## 2022-04-12 DIAGNOSIS — K7460 Unspecified cirrhosis of liver: Secondary | ICD-10-CM | POA: Diagnosis not present

## 2022-04-25 ENCOUNTER — Encounter: Payer: Self-pay | Admitting: Oncology

## 2022-04-25 NOTE — Progress Notes (Signed)
?Hematology/Oncology Progress note ?[phone] [phone] ?  ? ? ? ?Patient Care Team: ?Marguarite Arbour, MD as PCP - General (Internal Medicine) ?Lonell Face, MD as Consulting Physician (Neurology) ?Geanie Logan, MD as Referring Physician (Otolaryngology) ?Jerilee Field, MD as Consulting Physician (Urology) ?Rickard Patience, MD as Consulting Physician (Hematology and Oncology) ? ?REFERRING PROVIDER: ?Marguarite Arbour, MD ?CHIEF COMPLAINTS/REASON FOR VISIT:  ?Follow-up for iron deficiency anemia ? ?HISTORY OF PRESENTING ILLNESS:  ?Whitney [name] is a  81 y.o.  female with PMH listed below was seen in consultation at the request of Marguarite Arbour, MD   for evaluation of iron deficiency anemia.  ? ?Reviewed patient's recent labs  ?10/5/2021Labs revealed anemia with hemoglobin of 9.9 ?Iron panel ferritin 8, iron saturation 6, TIBC 4 7. ?Reviewed patient's previous labs ordered by other physician, anemia is new onset since October 2021. ?No aggravating or improving factors. ? ?Associated signs and symptoms: Patient reports fatigue.  SOB with exertion.  ?Denies weight loss, easy bruising, hematochezia, hemoptysis, hematuria. ?Context:  ?History of iron deficiency: Denies any previous history of iron deficiency.  She has been started on ferrous sulfate 325 mg daily.  She reports tolerating iron treatments very well. ?Rectal bleeding: She denies any bleeding she recalls that her stool has been dark even before iron supplementation ?Menstrual bleeding/ Vaginal bleeding : Denies ?Hematemesis or hemoptysis : denies ?Blood in urine : denies  ? ?Family history positive for sister Harriett Sine with colon cancer, another sister with kidney cancer and a daughter with kidney cancer.   ? ?06/29/2020, CT chest lung cancer screening showed lung RADS 2 ?Irregular liver contour consistent with cirrhosis.  Splenomegaly ?Markedly enlarged and a low-attenuation left thyroid.  Patient is status post a left  hemithyroidectomy 08/03/20 ?Pathology showed papillary microcarcinoma.  Nodular hyperplasia with dominant adenomatoid nodule exhibiting extensive organizing hemorrhage and fibrosis.  Post surgery TSH was normal at 3 on 09/01/2020. ?Patient has increased bilateral lower extremity swelling and has been to emergency room for concern of myxedema.  TSH and T4 have been normal. ? ?Patient has lost follow-up since January 2023.  She presents to reestablish care.  Was accompanied by her daughter. ?Patient reports feeling tired and fatigued. ?12/26/2021 - 12/30/2021 patient was hospitalized due to atypical pneumonia, sepsis, GI bleeding.  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 ?12/27/2021, EGD showed esophageal varices, portal hypertensive gastropathy and gastritis.  Patient was treated with octreotide drip and IV Protonix.  Her hemoglobin was low during hospitalization and the patient declined blood transfusion.  Hemoglobin was 7.5 at discharge.  Platelet count 90,000.  Patient was referred to reestablish care with hematology for further evaluation management. ?Patient denies seeing any blood in the stool since discharge.  No nausea vomiting diarrhea.. ? ? ?INTERVAL HISTORY ?Whitney [name] is a 81 y.o. female who has above history reviewed by me today presents for follow up visit for management of iron deficiency anemia ? ?Still feel tired. Tolerated IV venofer well. No new complaints.  ? ?Review of Systems  ?Constitutional:  Negative for appetite change, chills, fatigue and fever.  ?HENT:   Negative for hearing loss and voice change.   ?Eyes:  Negative for eye problems.  ?Respiratory:  Negative for chest tightness and cough.   ?Cardiovascular:  Positive for leg swelling. Negative for chest pain.  ?Gastrointestinal:  Negative for abdominal distention, abdominal pain and blood in stool.  ?Endocrine: Negative for hot flashes.  ?Genitourinary:  Negative for difficulty urinating and frequency.   ?Musculoskeletal:  Negative for  arthralgias.  ?Skin:  Negative for itching and rash.  ?Neurological:  Negative for extremity weakness.  ?Hematological:  Negative for adenopathy. Bruises/bleeds easily.  ?Psychiatric/Behavioral:  Negative for confusion.   ? ?MEDICAL HISTORY:  ?Past Medical History:  ?Diagnosis Date  ? Anemia   ? Arthritis   ? Broken shoulder   ? left humerus   ? COVID-19   ? Depression   ? Diabetes mellitus without complication (HCC)   ? Goiter   ? Hyperlipidemia   ? IDA (iron deficiency anemia) 01/11/2022  ? Overactive bladder   ? Parkinson's disease (HCC)   ? Pneumonia   ? Right arm weakness   ? limited movement  ? Unsteady gait   ? ? ?SURGICAL HISTORY: ?Past Surgical History:  ?Procedure Laterality Date  ? ABDOMINAL HYSTERECTOMY  1980  ? BLADDER REPAIR  1980  ? post hysterectomy  ? ESOPHAGOGASTRODUODENOSCOPY N/A 12/27/2021  ? Procedure: ESOPHAGOGASTRODUODENOSCOPY (EGD);  Surgeon: Toledo, Boykin Nearing, MD;  Location: [facility] END[facility];  Service: Gastroenterology;  Laterality: N/A;  ? NECK SURGERY    ? anterior cervical disc fusion  ? THYROIDECTOMY Left 08/03/2020  ? Procedure: THYROIDECTOMY-HEMI;  Surgeon: Geanie Logan, MD;  Location: [facility] ORS;  Service: ENT;  Laterality: Left;  ? ? ?SOCIAL HISTORY: ?Social History  ? ?Socioeconomic History  ? Marital status: Widowed  ?  Spouse name: Whitney [name]  ? Number of children: 2  ? Years of education: 25  ? Highest education level: Some college, no degree  ?Occupational History  ? Occupation: retired school system  ?Tobacco Use  ? Smoking status: Former  ?  Packs/day: 2.00  ?  Years: 40.00  ?  Pack years: 80.00  ?  Types: Cigarettes  ?  Quit date: 12/10/2018  ?  Years since quitting: 3.3  ? Smokeless tobacco: Never  ?Vaping Use  ? Vaping Use: Never used  ?Substance and Sexual Activity  ? Alcohol use: Not Currently  ? Drug use: Never  ? Sexual activity: Not Currently  ?Other Topics Concern  ? Not on file  ?Social History Narrative  ? Lost her husband of 57 years in oct 2019. Moved back from [place] in  Nov 2019  ? ?Social Determinants of Health  ? ?Financial Resource Strain: Not on file  ?Food Insecurity: Not on file  ?Transportation Needs: Not on file  ?Physical Activity: Not on file  ?Stress: Not on file  ?Social Connections: Not on file  ?Intimate Partner Violence: Not on file  ? ? ?FAMILY HISTORY: ?Family History  ?Problem Relation Age of Onset  ? Stroke Mother   ? Hypertension Mother   ? Diabetes Father   ? Heart disease Father   ? Lung cancer Father   ? Colon cancer Sister   ? Diabetes Sister   ? Kidney disease Daughter   ? Kidney cancer Daughter   ? Pulmonary embolism Sister   ? Diabetes Sister   ? Kidney disease Sister   ? Heart disease Sister   ? Atrial fibrillation Sister   ? Diabetes Brother   ? Heart disease Brother   ? AAA (abdominal aortic aneurysm) Brother   ? Cancer Brother   ?     skin  ? ? ?ALLERGIES:  has No Known Allergies. ? ?MEDICATIONS:  ?Current Outpatient Medications  ?Medication Sig Dispense Refill  ? acetaminophen (TYLENOL) 500 MG tablet Take 500-1,000 mg by mouth every 6 (six) hours as needed (for pain.).    ? amantadine (SYMMETREL) 100 MG capsule Take  100 mg by mouth 2 (two) times daily.    ? apixaban (ELIQUIS) 5 MG TABS tablet Take 1 tablet (5 mg total) by mouth 2 (two) times daily. 60 tablet 0  ? buPROPion (WELLBUTRIN XL) 150 MG 24 hr tablet Take 1 tablet by mouth every morning.    ? cholecalciferol (VITAMIN D3) 25 MCG (1000 UNIT) tablet Take 1,000 Units by mouth daily.     ? ferrous sulfate 325 (65 FE) MG tablet Take 325 mg by mouth daily with breakfast.     ? lidocaine (LIDODERM) 5 % Place 1 patch onto the skin every 12 (twelve) hours. Remove & Discard patch within 12 hours or as directed by MD 10 patch 0  ? metFORMIN (GLUCOPHAGE-XR) 500 MG 24 hr tablet Take 2 tablets by mouth 2 (two) times daily.    ? methocarbamol (ROBAXIN) 500 MG tablet Take 1 tablet (500 mg total) by mouth every 8 (eight) hours as needed for muscle spasms. 25 tablet 0  ? metolazone (ZAROXOLYN) 2.5 MG tablet  Take 2.5 mg by mouth daily.    ? MYRBETRIQ 50 MG TB24 tablet TAKE 1 TABLET(50 MG) BY MOUTH DAILY 30 tablet 11  ? pantoprazole (PROTONIX) 40 MG tablet Take 1 tablet (40 mg total) by mouth daily. 30 tablet 0  ? triamcin

## 2022-05-01 ENCOUNTER — Other Ambulatory Visit: Payer: Self-pay | Admitting: Internal Medicine

## 2022-05-01 DIAGNOSIS — S32010S Wedge compression fracture of first lumbar vertebra, sequela: Secondary | ICD-10-CM

## 2022-05-15 ENCOUNTER — Ambulatory Visit
Admission: RE | Admit: 2022-05-15 | Discharge: 2022-05-15 | Disposition: A | Discharge status: Home / Self Care | Payer: Medicare PPO | Source: Ambulatory Visit | Attending: Internal Medicine | Admitting: Internal Medicine

## 2022-05-15 DIAGNOSIS — S32010S Wedge compression fracture of first lumbar vertebra, sequela: Secondary | ICD-10-CM

## 2022-07-10 DEATH — deceased

## 2022-07-20 ENCOUNTER — Ambulatory Visit (INDEPENDENT_AMBULATORY_CARE_PROVIDER_SITE_OTHER): Payer: Medicare PPO | Admitting: Physician Assistant

## 2022-07-20 VITALS — BP 122/76 | HR 111 | Ht 62.0 in | Wt 151.0 lb

## 2022-07-20 DIAGNOSIS — N3281 Overactive bladder: Secondary | ICD-10-CM | POA: Diagnosis not present

## 2022-07-20 DIAGNOSIS — R310 Gross hematuria: Secondary | ICD-10-CM

## 2022-07-20 LAB — URINALYSIS, COMPLETE
Bilirubin, UA: NEGATIVE
Leukocytes,UA: NEGATIVE
Nitrite, UA: NEGATIVE
Specific Gravity, UA: 1.03 (ref 1.005–1.030)
Urobilinogen, Ur: 2 mg/dL — ABNORMAL HIGH (ref 0.2–1.0)
pH, UA: 5 (ref 5.0–7.5)

## 2022-07-20 LAB — MICROSCOPIC EXAMINATION: RBC, Urine: >30 /hpf — AB (ref 0–2)

## 2022-07-20 LAB — BLADDER SCAN AMB NON-IMAGING

## 2022-07-20 NOTE — Patient Instructions (Signed)
Please continue Myrbetriq in the morning and trospium at night. If there are any questions or concerns, you may call my office on the top right corner of this page (highlighted).

## 2022-07-20 NOTE — Progress Notes (Signed)
07/20/2022 4:43 PM   [name] 25-Jul-1941 [id number]  CC: Chief Complaint  Patient presents with   Over Active Bladder   HPI: Whitney [name] is a 81 y.o. female with PMH OAB wet on Myrbetriq 50 mg and trospium ER 60 mg, Parkinson's disease, PE on Eliquis x4 months, and diabetes with peripheral neuropathy who presents today for evaluation of recurrent UTIs.  She is accompanied today by her daughter, who contributes to HPI.  Today she reports an approximate 42-monthhistory of frequent UTIs, approximately once every week.  She describes her UTI symptoms as gross hematuria without pain.  Dr. SDoy Hutchinghas been managing these and should be calling them later today to start her on Cipro for yet another infection.  She is not currently on antibiotics.  She is now living at [facility]  She is a former smoker with a 40-pack-year history.  She remains bothered by her ongoing urinary leakage today.  In-office catheterized UA today positive for trace glucose, 1+ ketones, 3+ blood, 3+ protein, and 2.0 EU/DL urobilinogen; urine microscopy with >30 RBCs/HPF, granular casts, amorphous sediment, and moderate bacteria.  Bladder scan on arrival with 257 mL, last void 2 hours prior.  PMH: Past Medical History:  Diagnosis Date   Anemia    Arthritis    Broken shoulder    left humerus    COVID-19    Depression    Diabetes mellitus without complication (HCC)    Goiter    Hyperlipidemia    IDA (iron deficiency anemia) 01/11/2022   Overactive bladder    Parkinson's disease ([facility]    Pneumonia    Right arm weakness    limited movement   Unsteady gait     Surgical History: Past Surgical History:  [place]cedure [place]ality Date   [place]   ESOPHAGOGASTRODUODENOSCOPY N/A 12/27/2021   Procedure: ESOPHAGOGASTRODUODENOSCOPY (EGD);  Surgeon: Toledo, TBenay Pike MD;  Location: [facility];  Service: Gastroenterology;  Laterality: N/A;    NECK SURGERY     anterior cervical disc fusion   THYROIDECTOMY Left 08/03/2020   Procedure: THYROIDECTOMY-HEMI;  Surgeon: BClyde Canterbury MD;  Location: [facility] ORS;  Service: ENT;  Laterality: Left;    Home Medications:  Allergies as of 07/20/2022   No Known Allergies      Medication List        Accurate as of July 20, 2022  4:43 PM. If you have any questions, ask your nurse or doctor.          acetaminophen 500 MG tablet Commonly known as: TYLENOL Take 500-1,000 mg by mouth every 6 (six) hours as needed (for pain.).   amantadine 100 MG capsule Commonly known as: SYMMETREL Take 100 mg by mouth 2 (two) times daily.   apixaban 5 MG Tabs tablet Commonly known as: ELIQUIS Take 2 tablets (10 mg total) by mouth 2 (two) times daily for 5 days.   apixaban 5 MG Tabs tablet Commonly known as: ELIQUIS Take 1 tablet (5 mg total) by mouth 2 (two) times daily.   buPROPion 150 MG 24 hr tablet Commonly known as: WELLBUTRIN XL Take 1 tablet by mouth every morning.   cholecalciferol 25 MCG (1000 UNIT) tablet Commonly known as: VITAMIN D3 Take 1,000 Units by mouth daily.   cyanocobalamin 500 MCG tablet Commonly known as: VITAMIN B12 Take 500 mcg by mouth daily.   ferrous sulfate 325 (65 FE) MG tablet Take 325 mg by mouth daily  with breakfast.   furosemide 40 MG tablet Commonly known as: LASIX Take 40 mg by mouth daily as needed.   lactulose 10 GM/15ML solution Commonly known as: CHRONULAC Take 15 mLs (10 g total) by mouth daily.   lidocaine 5 % Commonly known as: Lidoderm Place 1 patch onto the skin every 12 (twelve) hours. Remove & Discard patch within 12 hours or as directed by MD   metFORMIN 500 MG 24 hr tablet Commonly known as: GLUCOPHAGE-XR Take 2 tablets by mouth 2 (two) times daily.   methocarbamol 500 MG tablet Commonly known as: Robaxin Take 1 tablet (500 mg total) by mouth every 8 (eight) hours as needed for muscle spasms.   metolazone 2.5 MG  tablet Commonly known as: ZAROXOLYN Take 2.5 mg by mouth daily.   Myrbetriq 50 MG Tb24 tablet Generic drug: mirabegron ER TAKE 1 TABLET(50 MG) BY MOUTH DAILY   pantoprazole 40 MG tablet Commonly known as: PROTONIX Take 1 tablet (40 mg total) by mouth daily.   polyethylene glycol 17 g packet Commonly known as: MIRALAX / GLYCOLAX Take 34 g by mouth daily.   triamcinolone cream 0.1 % Commonly known as: KENALOG Apply topically 2 (two) times daily.   Trospium Chloride 60 MG Cp24 TAKE 1 CAPSULE(60 MG) BY MOUTH DAILY        Allergies:  No Known Allergies  Family History: Family History  Problem Relation Age of Onset   Stroke Mother    Hypertension Mother    Diabetes Father    Heart disease Father    Lung cancer Father    Colon cancer Sister    Diabetes Sister    Kidney disease Daughter    Kidney cancer Daughter    Pulmonary embolism Sister    Diabetes Sister    Kidney disease Sister    Heart disease Sister    Atrial fibrillation Sister    Diabetes Brother    Heart disease Brother    AAA (abdominal aortic aneurysm) Brother    Cancer Brother        skin    Social History:   reports that she quit smoking about 3 years ago. Her smoking use included cigarettes. She has a 80.00 pack-year smoking history. She has never used smokeless tobacco. She reports that she does not currently use alcohol. She reports that she does not use drugs.  Physical Exam: BP 122/76   Pulse (!) 111   Ht 5' 2"  (1.575 m)   Wt 151 lb (68.5 kg)   BMI 27.62 kg/m   Constitutional:  Alert and oriented, no acute distress, nontoxic appearing HEENT: [place], AT Cardiovascular: No clubbing, cyanosis, or edema Respiratory: Normal respiratory effort, no increased work of breathing Skin: No rashes, bruises or suspicious lesions Neurologic: Grossly intact, no focal deficits, moving all 4 extremities Psychiatric: Normal mood and affect  Laboratory Data: Results for orders placed or performed in visit  on 07/20/22  Microscopic Examination   Urine  Result Value Ref Range   WBC, UA 0-5 0 - 5 /hpf   RBC, Urine >30 (A) 0 - 2 /hpf   Epithelial Cells (non renal) 0-10 0 - 10 /hpf   Casts Present (A) None seen /lpf   Cast Type Granular casts (A) N/A   Crystals Present (A) N/A   Crystal Type Amorphous Sediment N/A   Bacteria, UA Moderate (A) None seen/Few  Urinalysis, Complete  Result Value Ref Range   Specific Gravity, UA 1.030 1.005 - 1.030   pH, UA 5.0  5.0 - 7.5   Color, UA Red (A) Yellow   Appearance Ur Cloudy (A) Clear   Leukocytes,UA Negative Negative   Protein,UA 3+ (A) Negative/Trace   Glucose, UA Trace (A) Negative   Ketones, UA 1+ (A) Negative   RBC, UA 3+ (A) Negative   Bilirubin, UA Negative Negative   Urobilinogen, Ur 2.0 (H) 0.2 - 1.0 mg/dL   Nitrite, UA Negative Negative   Microscopic Examination See below:   Bladder Scan (Post Void Residual) in office  Result Value Ref Range   Scan Result 269m    Assessment & Plan:   1. Overactive bladder Stable, continue Myrbetriq and trospium.  We discussed that this issue needs to be deprioritized right now in the setting of #2 below. - Bladder Scan (Post Void Residual) in office  2. Hematuria, gross 2 months of painless gross hematuria being treated as recurrent UTIs.  UA today is without pyuria, so low clinical suspicion for infection, though I will repeat a standard and atypical culture for thoroughness.  I had a lengthy conversation today with the patient and her daughter regarding the etiology of blood in the urine.  I explained that blood in the urine can be caused by a myriad of factors, including but not limited to infection, stones, cysts, anticoagulation, and urinary tract malignancies.  I explained that the recommended work-up for blood in the urine is twofold and includes a CT urogram for evaluation of the upper urinary tract including kidneys and ureters as well as a cystoscopy for evaluation of the urethra and  bladder.  I explained that these two studies complement one another in reviewing the entire urinary tract for possible causes of bleeding.  I recommended that we proceed with this at this time.  Patient agreed; CTU ordered and follow-up cysto with CTU results scheduled. - Urinalysis, Complete - CULTURE, URINE COMPREHENSIVE - Mycoplasma / ureaplasma culture - CT HEMATURIA WORKUP; Future  Return in about 2 weeks (around 08/03/2022) for Cysto and CTU results.  SDebroah Loop PA-C  [facility] [address] [place] [phone]

## 2022-07-24 LAB — CULTURE, URINE COMPREHENSIVE

## 2022-07-27 ENCOUNTER — Ambulatory Visit
Admission: RE | Admit: 2022-07-27 | Discharge: 2022-07-27 | Disposition: A | Discharge status: Home / Self Care | Payer: Medicare PPO | Source: Ambulatory Visit | Attending: Physician Assistant | Admitting: Physician Assistant

## 2022-07-27 DIAGNOSIS — R310 Gross hematuria: Secondary | ICD-10-CM | POA: Insufficient documentation

## 2022-07-27 LAB — MYCOPLASMA / UREAPLASMA CULTURE
Mycoplasma hominis Culture: NEGATIVE
Ureaplasma urealyticum: NEGATIVE

## 2022-07-27 LAB — POCT I-STAT CREATININE: Creatinine, Ser: 0.4 mg/dL — ABNORMAL LOW (ref 0.44–1.00)

## 2022-07-27 MED ORDER — IOHEXOL 300 MG/ML  SOLN
100.0000 mL | Freq: Once | INTRAMUSCULAR | Status: AC | PRN
  Administered 2022-07-27: 100 mL via INTRAVENOUS

## 2022-07-31 ENCOUNTER — Other Ambulatory Visit: Payer: Medicare PPO | Admitting: Urology

## 2022-08-02 ENCOUNTER — Encounter: Payer: Self-pay | Admitting: Urology

## 2022-08-02 ENCOUNTER — Ambulatory Visit (INDEPENDENT_AMBULATORY_CARE_PROVIDER_SITE_OTHER): Payer: Medicare PPO | Admitting: Urology

## 2022-08-02 VITALS — BP 111/72 | HR 106 | Ht 62.0 in | Wt 147.0 lb

## 2022-08-02 DIAGNOSIS — N952 Postmenopausal atrophic vaginitis: Secondary | ICD-10-CM

## 2022-08-02 DIAGNOSIS — N3289 Other specified disorders of bladder: Secondary | ICD-10-CM

## 2022-08-02 DIAGNOSIS — N3281 Overactive bladder: Secondary | ICD-10-CM

## 2022-08-02 DIAGNOSIS — Z87440 Personal history of urinary (tract) infections: Secondary | ICD-10-CM

## 2022-08-02 DIAGNOSIS — N390 Urinary tract infection, site not specified: Secondary | ICD-10-CM

## 2022-08-02 LAB — URINALYSIS, COMPLETE
Bilirubin, UA: NEGATIVE
Glucose, UA: NEGATIVE
Leukocytes,UA: NEGATIVE
Nitrite, UA: NEGATIVE
Specific Gravity, UA: 1.025 (ref 1.005–1.030)
Urobilinogen, Ur: 1 mg/dL (ref 0.2–1.0)
pH, UA: 5 (ref 5.0–7.5)

## 2022-08-02 LAB — MICROSCOPIC EXAMINATION: RBC, Urine: >30 /hpf — AB (ref 0–2)

## 2022-08-02 MED ORDER — ESTRADIOL 0.1 MG/GM VA CREA
TOPICAL_CREAM | VAGINAL | 12 refills | Status: DC
Start: 2022-08-02 — End: 2022-12-06

## 2022-08-02 NOTE — Patient Instructions (Signed)
Estrogen Cream Instruction  Discard applicator  Apply pea sized amount to tip of finger to urethra before bed. Wash hands well after application. Use Monday, Wednesday and Friday   Please increase water in take daily

## 2022-08-02 NOTE — Progress Notes (Signed)
   08/03/22  CC:  Chief Complaint  Patient presents with   Cysto     HPI: Whitney [name] is a 81 y.o.female With the personal history of OAB wet on myrbetriq 50 mg on Trospium ER 60 mg, Parkinson's disease, PE on Eliquis x4 month, rUTIs, and diabetes with peripheral neuropathy, who presents today for cystoscopy.  Dr Doy Hutching has been managing her recurrent Utis.  She's a former smoker with a 40 pack year history.Her most recent urinalysis on 10/29/22 during visit with Whitney Loop, PA-C, had >30 RBCS per higher power field.  She underwent a CT urogram on 07/27/2022 that visualized no urolithiasis. No hydronephrosis, no suspicious renal cortical masses, no evidence of urothelial lesions with limitations. She had mildly diffuse bladder wall thickening mildly increased from prior CT. There was haziness of the perivesical fat, particularly anteriorly. Findings were nonspecific, with acute cystitis not excluded.  UA today has >30 RBCs.      Vitals:   08/02/22 0952  BP: 111/72  Pulse: (!) 106   NED. A&Ox3.   No respiratory distress   Abd soft, NT, ND Very irritated external genitalia with atrophy around the urethra  Cystoscopy Procedure Note  Patient identification was confirmed, informed consent was obtained, and patient was prepped using Betadine solution.  Lidocaine jelly was administered per urethral meatus.    Procedure: - Flexible cystoscope introduced, without any difficulty.   - Thorough search of the bladder revealed:    normal urethral meatus    no stones    no ulcers     no tumors    no urethral polyps    mild trabeculation    Mild inflammation on anterior surface of bladder wall      Urinary debris and thick squamous metaplasia of bladder wall and trigone  Significant periurethral irrtitation and erythema    - Ureteral orifices were normal in position and appearance.  Post-Procedure: - Patient tolerated the procedure well  Assessment/  Plan:  Atrophic vaginits  - Cystoscopy shows NED May benfit topic estrogen cream  - Will try her on cream pea-sized amount per urethra 3 times a week  2.  Squamous metaplasia -Significant squamous metaplasia and some debris limiting visualization -No obvious tumor today or malignant process which is reassuring -Given the degree of metaplasia and presence of inflammation, I recommended a follow-up cystoscopy in 6 months to ensure that there is no underlying issue   Return in about 6 months (around 02/02/2023) for 15mow/cysto.  IConley Rollsas a sEducation administratorfor AHollice Espy MD.,have documented all relevant documentation on the behalf of AHollice Espy MD,as directed by  AHollice Espy MD while in the presence of AHollice Espy MD.  I have reviewed the above documentation for accuracy and completeness, and I agree with the above.   AHollice Espy MD

## 2022-08-02 NOTE — Progress Notes (Signed)
In and Out Catheterization  Patient is present today for a I & O catheterization for UA collection. Patient was cleaned and prepped in a sterile fashion with betadine . A 14FR cath was inserted no complications were noted , 54m of urine return was noted, urine was amber in color. A clean urine sample was collected for UA. Bladder was drained  And catheter was removed with out difficulty.    Performed by: SFonnie Jarvis CMA

## 2022-09-09 ENCOUNTER — Emergency Department: Payer: Medicare PPO

## 2022-09-09 ENCOUNTER — Emergency Department
Admission: EM | Admit: 2022-09-09 | Discharge: 2022-09-09 | Disposition: A | Discharge status: Home / Self Care | Payer: Medicare PPO | Attending: Emergency Medicine | Admitting: Emergency Medicine

## 2022-09-09 ENCOUNTER — Other Ambulatory Visit: Payer: Self-pay

## 2022-09-09 DIAGNOSIS — S0083XA Contusion of other part of head, initial encounter: Secondary | ICD-10-CM | POA: Diagnosis not present

## 2022-09-09 DIAGNOSIS — W050XXA Fall from non-moving wheelchair, initial encounter: Secondary | ICD-10-CM | POA: Insufficient documentation

## 2022-09-09 DIAGNOSIS — W19XXXA Unspecified fall, initial encounter: Secondary | ICD-10-CM

## 2022-09-09 DIAGNOSIS — S0990XA Unspecified injury of head, initial encounter: Secondary | ICD-10-CM | POA: Diagnosis present

## 2022-09-09 MED ORDER — OXYMETAZOLINE HCL 0.05 % NA SOLN: 2.0000 | Freq: Two times a day (BID) | NASAL | 0 refills | Status: DC | PRN

## 2022-09-09 MED ORDER — ACETAMINOPHEN 500 MG PO TABS
1000.0000 mg | ORAL_TABLET | Freq: Once | ORAL | Status: AC
  Administered 2022-09-09: 1000 mg via ORAL
  Filled 2022-09-09: qty 2

## 2022-09-09 NOTE — ED Notes (Signed)
Patient is resting on stretcher in room. Siderails are up and door is open. Waiting for transport.

## 2022-09-09 NOTE — ED Notes (Signed)
E signature pad not working. Pt and staff nurse educated on discharge instructions and verbalized understanding.

## 2022-09-09 NOTE — ED Triage Notes (Addendum)
Per EMT report, patient is wheelchair-bound and was reaching down to the floor and fell out of the wheelchair. It was a witnessed fall by staff. Patient is on Eliquist. Patient has abrasions on forehead and nose was bleeding, but has resolved. Patient c/o bilateral  arm pain from when staff pulled her up off the floor according to patient. Patient is alert and oriented upon arrival, but is somewhat slow to respond that could be due to hearing issues.  136/75 109 pulse 95% room air

## 2022-09-09 NOTE — ED Notes (Signed)
Right nares is swollen, no active bleeding.

## 2022-09-09 NOTE — Discharge Instructions (Addendum)
Take Tylenol 500 mg every 4-6 hours for pain  In the event of a nosebleed, use 2 sprays of Afrin (Oxymetazoline) to both nostrils then apply pressure for 10 minutes

## 2022-09-09 NOTE — ED Provider Notes (Signed)
[facility] Provider Note    Event Date/Time   First MD Initiated Contact with Patient 09/09/22 1718     (approximate)   History   Fall   HPI  Whitney [name] is a 81 y.o. female here with fall.  Patient reportedly was sitting in a wheelchair.  She leaned forward and fell onto her face.  She subsequently sustained a bruise to the face with surrounding ecchymoses.  She had bleeding from her nares as well.  She did not lose consciousness.  No chest pain.  No other areas of pain.  Patient states she felt well prior to the follow-up. Patient is on blood thinners.     Physical Exam   Triage Vital Signs: ED Triage Vitals [09/09/22 1726]  Enc Vitals Group     BP 123/73     Pulse Rate (!) 108     Resp 20     Temp 98.4 F (36.9 C)     Temp Source Oral     SpO2 96 %     Weight      Height      Head Circumference      Peak Flow      Pain Score      Pain Loc      Pain Edu?      Excl. in [place]?     Most recent vital signs: Vitals:   09/09/22 1726 09/09/22 1835  BP: 123/73 129/68  Pulse: (!) 108 95  Resp: 20 18  Temp: 98.4 F (36.9 C)   SpO2: 96% 96%     General: Awake, no distress.  CV:  Good peripheral perfusion.  Resp:  Normal effort.  Abd:  No distention.  Other:  Ecchymoses and superficial abrasions to the face.  No obvious deformity.  There is slight swelling of the nasal bridge.  Dried blood in the right greater than left nares but no active bleeding noted.  No apparent dental trauma.  No midline cervical tenderness.  Mild tenderness over the anterior chest wall but no deformity or bruising.  No tenderness or deformity to the bilateral upper and lower extremities.   ED Results / Procedures / Treatments   Labs (all labs ordered are listed, but only abnormal results are displayed) Labs Reviewed - No data to display   EKG    RADIOLOGY CT head: NAICA CT face:Old nasal fx, no new fx CT C-spine:No acute abnormality Chest x-ray:No  acute abnormality   I also independently reviewed and agree with radiologist interpretations.   PROCEDURES:  Critical Care performed: No   MEDICATIONS ORDERED IN ED: Medications  acetaminophen (TYLENOL) tablet 1,000 mg (1,000 mg Oral Given 09/09/22 1832)     IMPRESSION / MDM / ASSESSMENT AND PLAN / ED COURSE  I reviewed the triage vital signs and the nursing notes.                               The patient is on the cardiac monitor to evaluate for evidence of arrhythmia and/or significant heart rate changes.   Ddx:  Differential includes the following, with pertinent life- or limb-threatening emergencies considered:  Mechanical fall with facial contusions, facial fracture, nasal fracture, TBI, subdural, chest injury due to fall, rib fractures, pneumothorax  Patient's presentation is most consistent with acute presentation with potential threat to life or bodily function.  MDM:  81 year old female here with mechanical fall and contusion to the  face.  Patient is on anticoagulation.  She is alert, oriented, at her neurological baseline.  Patient will be sent for CT head, face, neck.  No other evidence of significant trauma to the extremities.  CT imaging shows no acute injury. Pt is otherwise well appearing and at her mental baseline. Wounds dressed, will d/c with tylenol at home PRN and good return precautions.   MEDICATIONS GIVEN IN ED: Medications  acetaminophen (TYLENOL) tablet 1,000 mg (1,000 mg Oral Given 09/09/22 1832)     Consults:     EMR reviewed       FINAL CLINICAL IMPRESSION(S) / ED DIAGNOSES   Final diagnoses:  Fall, initial encounter  Contusion of face, initial encounter     Rx / DC Orders   ED Discharge Orders          Ordered    oxymetazoline (AFRIN) 0.05 % nasal spray  2 times daily PRN        09/09/22 1908             Note:  This document was prepared using Dragon voice recognition software and may include unintentional  dictation errors.   Duffy Bruce, MD 09/09/22 1925

## 2022-10-01 ENCOUNTER — Other Ambulatory Visit: Payer: Self-pay

## 2022-10-02 ENCOUNTER — Encounter: Payer: Self-pay | Admitting: Gastroenterology

## 2022-10-02 ENCOUNTER — Ambulatory Visit (INDEPENDENT_AMBULATORY_CARE_PROVIDER_SITE_OTHER): Payer: Medicare PPO | Admitting: Gastroenterology

## 2022-10-02 VITALS — BP 128/72 | HR 68 | Temp 97.8°F

## 2022-10-02 DIAGNOSIS — K7460 Unspecified cirrhosis of liver: Secondary | ICD-10-CM | POA: Diagnosis not present

## 2022-10-02 NOTE — Progress Notes (Signed)
Cephas Darby, MD [address]  [place]  [place], [place] [address]  Main: [phone]  Fax: [phone]    Gastroenterology Consultation  Referring Provider:     Idelle Crouch, MD Primary Care Physician:  Idelle Crouch, MD Primary Gastroenterologist:  Dr. Alice Reichert Reason for Consultation: Cirrhosis of liver, ?colon mass        HPI:   Whitney [name] is a 81 y.o. female referred by Dr. Doy Hutching, Leonie Douglas, MD  for consultation & management of recent hospitalization for shortness of breath and generalized weakness secondary to severe symptomatic iron deficiency anemia, as well as new diagnosis of bilateral pulmonary emboli with evidence of right heart strain.  Patient underwent CT abdomen and pelvis with contrast, was incidentally found to have thickening of the cecum concerning for a mass.  Patient was discharged to rehab on Eliquis.  Patient is today accompanied by her daughter.  Patient is taking oral iron daily.  She denies any abdominal pain, bloating, rectal bleeding, nausea or vomiting or melena.  Patient has an appointment to see hematologist, Dr. Tasia Catchings on 5/4 Patient is accompanied by her daughter who reports that she will be moving to senior citizen facility soon.  Patient is wheelchair-bound secondary to severe bilateral lower extremity weakness  Follow-up visit 10/02/2022 Patient is here for follow-up of cirrhosis of liver.  She has been doing fairly okay from cirrhosis standpoint.  Denies any swelling of legs, abdominal distention, black stools or rectal bleeding.  She is mostly wheelchair-bound, residing at group home.  Patient is accompanied by her daughter today.  Patient has new onset of A-fib, has an appointment to see Dr. Josefa Half for further evaluation.  Patient continues to take Eliquis for history of PE.  She was also seen by the hematologist Dr. Tasia Catchings in May 2023.  However, there were no recommendations made on duration of anticoagulation for her PE.  NSAIDs:  None  Antiplts/Anticoagulants/Anti thrombotics: Eliquis for history of PE  GI Procedures:  Reported history of undergoing colonoscopy about 5 years ago in [place], [place] and reportedly it was normal.  Has had other colonoscopies prior to that as well, according to the patient, which were normal.  None of her previous procedure reports are available to [place].   Upper endoscopy 12/27/2021 - Grade II esophageal varices. - Gastritis. - Portal hypertensive gastropathy. - Normal examined duodenum. - 1 cm hiatal hernia. - The examination was otherwise normal. - No specimens collected.  Past Medical History:  Diagnosis Date   Anemia    Arthritis    Broken shoulder    left humerus    COVID-19    Depression    Diabetes mellitus without complication ([place])    Goiter    Hyperlipidemia    IDA (iron deficiency anemia) 01/11/2022   Overactive bladder    Parkinson's disease    Pneumonia    Right arm weakness    limited movement   Unsteady gait     Past Surgical History:  Procedure Laterality Date   [place]   post hysterectomy   ESOPHAGOGASTRODUODENOSCOPY N/A 12/27/2021   Procedure: ESOPHAGOGASTRODUODENOSCOPY (EGD);  Surgeon: Tol[place], Benay Pike, MD;  Location: [facility] ENDOSCOPY;  Service: Gastroenterology;  Laterality: N/A;   NECK SURGERY     anterior cervical disc fusion   THYROIDECTOMY Left 08/03/2020   Procedure: THYROIDECTOMY-HEMI;  Surgeon: Clyde Canterbury, MD;  Location: [facility] ORS;  Service: ENT;  Laterality: Left;  Current Outpatient Medications:    acetaminophen (TYLENOL) 500 MG tablet, Take 500-1,000 mg by mouth every 6 (six) hours as needed (for pain.)., Disp: , Rfl:    amantadine (SYMMETREL) 100 MG capsule, Take 100 mg by mouth 2 (two) times daily., Disp: , Rfl:    apixaban (ELIQUIS) 5 MG TABS tablet, Take 1 tablet (5 mg total) by mouth 2 (two) times daily., Disp: 60 tablet, Rfl: 0   buPROPion (WELLBUTRIN XL) 150 MG 24 hr tablet,  Take 1 tablet by mouth every morning., Disp: , Rfl:    cholecalciferol (VITAMIN D3) 25 MCG (1000 UNIT) tablet, Take 1,000 Units by mouth daily. , Disp: , Rfl:    estradiol (ESTRACE) 0.1 MG/GM vaginal cream, Discard applicator Apply pea sized amount to tip of finger to urethra before bed. Wash hands well after application. Use Monday, Wednesday and Friday, Disp: 42.5 g, Rfl: 12   ferrous sulfate 325 (65 FE) MG tablet, Take 325 mg by mouth daily with breakfast. , Disp: , Rfl:    furosemide (LASIX) 40 MG tablet, Take 40 mg by mouth daily as needed., Disp: , Rfl:    lidocaine (LIDODERM) 5 %, Place onto the skin., Disp: , Rfl:    metFORMIN (GLUCOPHAGE) 1000 MG tablet, Take 1,000 mg by mouth 2 (two) times daily., Disp: , Rfl:    MYRBETRIQ 50 MG TB24 tablet, TAKE 1 TABLET(50 MG) BY MOUTH DAILY, Disp: 30 tablet, Rfl: 11   nitrofurantoin, macrocrystal-monohydrate, (MACROBID) 100 MG capsule, Take 100 mg by mouth 2 (two) times daily., Disp: , Rfl:    pantoprazole (PROTONIX) 40 MG tablet, Take 1 tablet by mouth daily., Disp: , Rfl:    polyethylene glycol (MIRALAX / GLYCOLAX) 17 g packet, Take 34 g by mouth daily., Disp: 14 each, Rfl: 0   triamcinolone cream (KENALOG) 0.1 %, Apply topically 2 (two) times daily., Disp: , Rfl:    Trospium Chloride 60 MG CP24, TAKE 1 CAPSULE(60 MG) BY MOUTH DAILY, Disp: 90 capsule, Rfl: 3   vitamin B-12 (CYANOCOBALAMIN) 500 MCG tablet, Take 500 mcg by mouth daily., Disp: , Rfl:   Family History  Problem Relation Age of Onset   Stroke Mother    Hypertension Mother    Diabetes Father    Heart disease Father    Lung cancer Father    Colon cancer Sister    Diabetes Sister    Kidney disease Daughter    Kidney cancer Daughter    Pulmonary embolism Sister    Diabetes Sister    Kidney disease Sister    Heart disease Sister    Atrial fibrillation Sister    Diabetes Brother    Heart disease Brother    AAA (abdominal aortic aneurysm) Brother    Cancer Brother        skin      Social History   Tobacco Use   Smoking status: Former    Packs/day: 2.00    Years: 40.00    Total pack years: 80.00    Types: Cigarettes    Quit date: 12/10/2018    Years since quitting: 3.8   Smokeless tobacco: Never  Vaping Use   Vaping Use: Never used  Substance Use Topics   Alcohol use: Not Currently   Drug use: Never    Allergies as of 10/02/2022   (No Known Allergies)    Review of Systems:    All systems reviewed and negative except where noted in HPI.   Physical Exam:  BP 128/72 (BP Location: Left  Arm, Patient Position: Sitting, Cuff Size: Normal)   Pulse 68   Temp 97.8 F (36.6 C) (Oral)  No LMP recorded. Patient has had a hysterectomy.  General:   Alert,  Well-developed, well-nourished, pleasant and cooperative in NAD Head:  Normocephalic and atraumatic. Eyes:  Sclera clear, no icterus.   Conjunctiva pink. Ears:  Normal auditory acuity. Nose:  No deformity, discharge, or lesions. Mouth:  No deformity or lesions,oropharynx pink & moist. Neck:  Supple; no masses or thyromegaly. Lungs:  Respirations even and unlabored.  Clear throughout to auscultation.   No wheezes, crackles, or rhonchi. No acute distress. Heart:  Regular rate and rhythm; no murmurs, clicks, rubs, or gallops. Abdomen:  Normal bowel sounds. Soft, non-tender and non-distended without masses, hepatosplenomegaly or hernias noted.  No guarding or rebound tenderness.   Rectal: Not performed Msk:  Symmetrical without gross deformities, bilateral lower extremity weakness Pulses:  Normal pulses noted. Extremities:  No clubbing or edema.  No cyanosis. Neurologic:  Alert and oriented x3;  grossly normal neurologically. Skin:  Intact without significant lesions or rashes. No jaundice. Psych:  Alert and cooperative. Normal mood and affect.  Imaging Studies: Reviewed  Assessment and Plan:   Lashann Hagg is a 81 y.o. female with history of metabolic syndrome, Karlene Lineman cirrhosis with esophageal varices,  pulmonary hypertension, history of chronic iron deficiency anemia is seen in consultation for incidentally found possible cecum/ascending colon mass/thickening on the CT abdomen and pelvis during recent hospitalization.  Patient has diagnosis of acute pulmonary embolism with right heart strain based on CT angio on 03/01/2022, currently treated with Eliquis  ?cecum/ascending colon mass/thickening on the CT abdomen and pelvis Today, I have discussed with the patient and her daughter regarding implications of colonoscopy and ruling out colon cancer.  Even if patient is found to have colon cancer, she will be at high risk for surgery or chemo if indicated.  Patient and her daughter both understand and patient actually wanted to know if she has colon cancer or not.  However, patient needs to be cleared by cardiology as she is undergoing evaluation for A-fib.  Also, her Eliquis needs to be interrupted for colonoscopy for which we need clearance. Patient's daughter would like to hold off on the colonoscopy until evaluated by cardiology and she will call our office back once a decision is made to proceed with colonoscopy   Karlene Lineman cirrhosis of liver without evidence of ascites, well compensated History of esophageal varices, nonbleeding, thrombocytopenia No evidence of [place] based on the most recent CT scan Appears euvolemic Recommend low-sodium diet   Follow up in 6 months   Cephas Darby, MD

## 2022-10-08 ENCOUNTER — Encounter (INDEPENDENT_AMBULATORY_CARE_PROVIDER_SITE_OTHER): Payer: Self-pay

## 2022-10-15 ENCOUNTER — Inpatient Hospital Stay: Payer: Medicare PPO | Attending: Oncology

## 2022-10-15 DIAGNOSIS — E119 Type 2 diabetes mellitus without complications: Secondary | ICD-10-CM | POA: Diagnosis not present

## 2022-10-15 DIAGNOSIS — E785 Hyperlipidemia, unspecified: Secondary | ICD-10-CM | POA: Insufficient documentation

## 2022-10-15 DIAGNOSIS — N3281 Overactive bladder: Secondary | ICD-10-CM | POA: Insufficient documentation

## 2022-10-15 DIAGNOSIS — Z801 Family history of malignant neoplasm of trachea, bronchus and lung: Secondary | ICD-10-CM | POA: Insufficient documentation

## 2022-10-15 DIAGNOSIS — Z8616 Personal history of COVID-19: Secondary | ICD-10-CM | POA: Insufficient documentation

## 2022-10-15 DIAGNOSIS — D696 Thrombocytopenia, unspecified: Secondary | ICD-10-CM | POA: Diagnosis not present

## 2022-10-15 DIAGNOSIS — R161 Splenomegaly, not elsewhere classified: Secondary | ICD-10-CM | POA: Insufficient documentation

## 2022-10-15 DIAGNOSIS — Z79899 Other long term (current) drug therapy: Secondary | ICD-10-CM | POA: Diagnosis not present

## 2022-10-15 DIAGNOSIS — D509 Iron deficiency anemia, unspecified: Secondary | ICD-10-CM | POA: Diagnosis not present

## 2022-10-15 DIAGNOSIS — Z7984 Long term (current) use of oral hypoglycemic drugs: Secondary | ICD-10-CM | POA: Diagnosis not present

## 2022-10-15 DIAGNOSIS — G20A1 Parkinson's disease without dyskinesia, without mention of fluctuations: Secondary | ICD-10-CM | POA: Insufficient documentation

## 2022-10-15 DIAGNOSIS — K7460 Unspecified cirrhosis of liver: Secondary | ICD-10-CM | POA: Insufficient documentation

## 2022-10-15 DIAGNOSIS — Z7901 Long term (current) use of anticoagulants: Secondary | ICD-10-CM | POA: Diagnosis not present

## 2022-10-15 DIAGNOSIS — Z800 Family history of malignant neoplasm of digestive organs: Secondary | ICD-10-CM | POA: Diagnosis not present

## 2022-10-15 DIAGNOSIS — D500 Iron deficiency anemia secondary to blood loss (chronic): Secondary | ICD-10-CM

## 2022-10-15 LAB — CBC WITH DIFFERENTIAL/PLATELET
Abs Immature Granulocytes: 0.01 10*3/uL (ref 0.00–0.07)
Basophils Absolute: 0 10*3/uL (ref 0.0–0.1)
Basophils Relative: 0 %
Eosinophils Absolute: 0.1 10*3/uL (ref 0.0–0.5)
Eosinophils Relative: 2 %
HCT: 34.4 % — ABNORMAL LOW (ref 36.0–46.0)
Hemoglobin: 11.3 g/dL — ABNORMAL LOW (ref 12.0–15.0)
Immature Granulocytes: 0 %
Lymphocytes Relative: 27 %
Lymphs Abs: 1.3 10*3/uL (ref 0.7–4.0)
MCH: 31 pg (ref 26.0–34.0)
MCHC: 32.8 g/dL (ref 30.0–36.0)
MCV: 94.2 fL (ref 80.0–100.0)
Monocytes Absolute: 0.4 10*3/uL (ref 0.1–1.0)
Monocytes Relative: 9 %
Neutro Abs: 3 10*3/uL (ref 1.7–7.7)
Neutrophils Relative %: 62 %
Platelets: 108 10*3/uL — ABNORMAL LOW (ref 150–400)
RBC: 3.65 MIL/uL — ABNORMAL LOW (ref 3.87–5.11)
RDW: 16.9 % — ABNORMAL HIGH (ref 11.5–15.5)
WBC: 4.8 10*3/uL (ref 4.0–10.5)
nRBC: 0 % (ref 0.0–0.2)

## 2022-10-15 LAB — IRON AND TIBC
Iron: 55 ug/dL (ref 28–170)
Saturation Ratios: 19 % (ref 10.4–31.8)
TIBC: 287 ug/dL (ref 250–450)
UIBC: 232 ug/dL

## 2022-10-15 LAB — FERRITIN: Ferritin: 40 ng/mL (ref 11–307)

## 2022-10-16 MED FILL — Iron Sucrose Inj 20 MG/ML (Fe Equiv): INTRAVENOUS | Qty: 10 | Status: AC

## 2022-10-17 ENCOUNTER — Inpatient Hospital Stay (HOSPITAL_BASED_OUTPATIENT_CLINIC_OR_DEPARTMENT_OTHER): Payer: Medicare PPO | Admitting: Oncology

## 2022-10-17 ENCOUNTER — Encounter: Payer: Self-pay | Admitting: Oncology

## 2022-10-17 ENCOUNTER — Inpatient Hospital Stay: Payer: Medicare PPO

## 2022-10-17 VITALS — BP 113/87 | HR 108 | Temp 97.2°F | Wt 141.0 lb

## 2022-10-17 DIAGNOSIS — D696 Thrombocytopenia, unspecified: Secondary | ICD-10-CM | POA: Diagnosis not present

## 2022-10-17 DIAGNOSIS — K7469 Other cirrhosis of liver: Secondary | ICD-10-CM | POA: Diagnosis not present

## 2022-10-17 DIAGNOSIS — D500 Iron deficiency anemia secondary to blood loss (chronic): Secondary | ICD-10-CM | POA: Diagnosis not present

## 2022-10-17 DIAGNOSIS — D509 Iron deficiency anemia, unspecified: Secondary | ICD-10-CM | POA: Diagnosis not present

## 2022-10-17 NOTE — Assessment & Plan Note (Signed)
Iron deficiency anemia,  She tolerated IV venofer well.  Labs are reviewed and discussed with patient. Hemoglobin has normalized.  Iron panel is stable. Hold off IV venofer today.  Continue oral ferrous sulfate 362m daily.

## 2022-10-17 NOTE — Assessment & Plan Note (Signed)
platelet count stable, due to cirrhosis/ splenomegaly.  Monitor

## 2022-10-17 NOTE — Assessment & Plan Note (Signed)
#  Liver cirrhosis, splenomegaly,  Recommend patient to closely follow-up with gastroenterology for management. She is waiting for cardiology clearance for colonoscopy

## 2022-10-17 NOTE — Progress Notes (Signed)
Hematology/Oncology Progress note [phone] [phone]      Patient Care Team: Whitney Crouch, MD as PCP - General (Internal Medicine) Whitney Crofts, MD as Consulting Physician (Neurology) Whitney Canterbury, MD as Referring Physician (Otolaryngology) Whitney Aloe, MD as Consulting Physician (Urology) Whitney Server, MD as Consulting Physician (Hematology and Oncology)  ASSESSMENT & PLAN:   IDA (iron deficiency anemia)  Iron deficiency anemia,  She tolerated IV venofer well.  Labs are reviewed and discussed with patient. Hemoglobin has normalized.  Iron panel is stable. Hold off IV venofer today.  Continue oral ferrous sulfate 398m daily.   Hepatic cirrhosis (HCC) #Liver cirrhosis, splenomegaly,  Recommend patient to closely follow-up with gastroenterology for management. She is waiting for cardiology clearance for colonoscopy  Thrombocytopenia (HCC) platelet count stable, due to cirrhosis/ splenomegaly.  Monitor   Orders Placed This Encounter  Procedures   CBC with Differential/Platelet    Standing Status:   Future    Standing Expiration Date:   10/17/2023   Iron and TIBC([facility])    Standing Status:   Future    Standing Expiration Date:   10/18/2023   Ferritin    Standing Status:   Future    Standing Expiration Date:   10/18/2023   AFP tumor marker    Standing Status:   Future    Standing Expiration Date:   10/17/2023   Comprehensive metabolic panel    Standing Status:   Future    Standing Expiration Date:   10/17/2023   Follow up in 6 months.  All questions were answered. The patient knows to call the clinic with any problems, questions or concerns.  Whitney Server MD, PhD [facility] Oncology 10/17/2022   CHIEF COMPLAINTS/REASON FOR VISIT:  Follow-up for iron deficiency anemia  HISTORY OF PRESENTING ILLNESS:  MAmandajo Gonderis a  81y.o.  female with PMH listed below was seen in consultation at the request of Whitney Crouch MD   for evaluation of iron deficiency anemia.   Reviewed patient's recent labs  10/5/2021Labs revealed anemia with hemoglobin of 9.9 Iron panel ferritin 8, iron saturation 6, TIBC 4 7. Reviewed patient's previous labs ordered by other physician, anemia is new onset since October 2021. No aggravating or improving factors.  Associated signs and symptoms: Patient reports fatigue.  SOB with exertion.  Denies weight loss, easy bruising, hematochezia, hemoptysis, hematuria. Context:  History of iron deficiency: Denies any previous history of iron deficiency.  She has been started on ferrous sulfate 325 mg daily.  She reports tolerating iron treatments very well. Rectal bleeding: She denies any bleeding she recalls that her stool has been dark even before iron supplementation Menstrual bleeding/ Vaginal bleeding : Denies Hematemesis or hemoptysis : denies Blood in urine : denies   Family history positive for sister Whitney Galawith colon cancer, another sister with kidney cancer and a daughter with kidney cancer.    06/29/2020, CT chest lung cancer screening showed lung RADS 2 Irregular liver contour consistent with cirrhosis.  Splenomegaly Markedly enlarged and a low-attenuation left thyroid.  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  Post surgery TSH was normal at 3 on 09/01/2020. Patient has increased bilateral lower extremity swelling and has been to emergency room for concern of myxedema.  TSH and T4 have been normal.  Patient has lost follow-up since January 2023.  She presents to reestablish care.  Was accompanied by her daughter. Patient reports feeling tired and fatigued. 12/26/2021 - 12/30/2021 patient was hospitalized due to atypical pneumonia, sepsis, GI bleeding.  CT scan was consistent with liver  cirrhosis. 12/27/2021, EGD showed esophageal varices, portal hypertensive gastropathy and gastritis.  Patient was treated with octreotide drip and IV Protonix.  Her hemoglobin was low during hospitalization and the patient declined blood transfusion.  Hemoglobin was 7.5 at discharge.  Platelet count 90,000.  Patient was referred to reestablish care with hematology for further evaluation management. Patient denies seeing any blood in the stool since discharge.  No nausea vomiting diarrhea..   INTERVAL HISTORY Whitney [name] is a 81 y.o. female who has above history reviewed by me today presents for follow up visit for management of iron deficiency anemia  Still feel tired. Tolerated IV venofer well. No new complaints.   Review of Systems  Constitutional:  Negative for appetite change, chills, fatigue and fever.  HENT:   Negative for hearing loss and voice change.   Eyes:  Negative for eye problems.  Respiratory:  Negative for chest tightness and cough.   Cardiovascular:  Positive for leg swelling. Negative for chest pain.  Gastrointestinal:  Negative for abdominal distention, abdominal pain and blood in stool.  Endocrine: Negative for hot flashes.  Genitourinary:  Negative for difficulty urinating and frequency.   Musculoskeletal:  Negative for arthralgias.  Skin:  Negative for itching and rash.  Neurological:  Negative for extremity weakness.  Hematological:  Negative for adenopathy. Bruises/bleeds easily.  Psychiatric/Behavioral:  Negative for confusion.     MEDICAL HISTORY:  Past Medical History:  Diagnosis Date   Anemia    Arthritis    Broken shoulder    left humerus    COVID-19    Depression    Diabetes mellitus without complication ([place])    Goiter    Hyperlipidemia    IDA (iron deficiency anemia) 01/11/2022   Overactive bladder    Parkinson's disease    Pneumonia    Right arm weakness    limited movement   Unsteady gait     SURGICAL HISTORY: Past Surgical History:   Procedure Laterality Date   ABDOMINAL HYSTERECTOMY  1980   BLADDER REPAIR  1980   post hysterectomy   ESOPHAGOGASTRODUODENOSCOPY N/A 12/27/2021   Procedure: ESOPHAGOGASTRODUODENOSCOPY (EGD);  Surgeon: Whitney [name], Benay Pike, MD;  Location: [facility] ENDOSCOPY;  Service: Gastroenterology;  Laterality: N/A;   NECK SURGERY     anterior cervical disc fusion   THYROIDECTOMY Left 08/03/2020   Procedure: THYROIDECTOMY-HEMI;  Surgeon: Whitney Canterbury, MD;  Location: [facility] ORS;  Service: ENT;  Laterality: Left;    SOCIAL HISTORY: Social History   Socioeconomic History   Marital status: Widowed    Spouse name: Jeneen Rinks   Number of children: 2   Years of education: 12   Highest education level: Some college, no degree  Occupational History   Occupation: retired school system  Tobacco Use   Smoking status: Former    Packs/day: 2.00    Years: 40.00    Total pack years: 80.00    Types: Cigarettes    Quit date: 12/10/2018    Years since quitting: 3.8   Smokeless tobacco: Never  Vaping Use   Vaping Use: Never used  Substance and Sexual Activity   Alcohol use: Not Currently   Drug use: Never   Sexual activity: Not Currently  Other Topics Concern   Not on file  Social History Narrative   Lost her husband of 31 years in  oct 2019. Moved back from [place] in Nov 2019   Social Determinants of Health   Financial Resource Strain: Low Risk  (05/26/2020)   Overall Financial Resource Strain (CARDIA)    Difficulty of Paying Living Expenses: Not hard at all  Food Insecurity: No Food Insecurity (05/26/2020)   Hunger Vital Sign    Worried About Running Out of Food in the Last Year: Never true    Ran Out of Food in the Last Year: Never true  Transportation Needs: No Transportation Needs (05/26/2020)   PRAPARE - Hydrologist (Medical): No    Lack of Transportation (Non-Medical): No  Physical Activity: Inactive (05/26/2020)   Exercise Vital Sign    Days of Exercise per Week: 0 days     Minutes of Exercise per Session: 0 min  Stress: No Stress Concern Present (05/26/2020)   [place]    Feeling of Stress : Not at all  Social Connections: Socially Isolated (05/26/2020)   Social Connection and Isolation Panel [NHANES]    Frequency of Communication with Friends and Family: More than three times a week    Frequency of Social Gatherings with Friends and Family: More than three times a week    Attends Religious Services: Never    Marine scientist or Organizations: No    Attends Archivist Meetings: Never    Marital Status: Widowed  Intimate Partner Violence: Not At Risk (05/26/2020)   Humiliation, Afraid, Rape, and Kick questionnaire    Fear of Current or Ex-Partner: No    Emotionally Abused: No    Physically Abused: No    Sexually Abused: No    FAMILY HISTORY: Family History  Problem Relation Age of Onset   Stroke Mother    Hypertension Mother    Diabetes Father    Heart disease Father    Lung cancer Father    Colon cancer Sister    Diabetes Sister    Kidney disease Daughter    Kidney cancer Daughter    Pulmonary embolism Sister    Diabetes Sister    Kidney disease Sister    Heart disease Sister    Atrial fibrillation Sister    Diabetes Brother    Heart disease Brother    AAA (abdominal aortic aneurysm) Brother    Cancer Brother        skin    ALLERGIES:  has No Known Allergies.  MEDICATIONS:  Current Outpatient Medications  Medication Sig Dispense Refill   acetaminophen (TYLENOL) 500 MG tablet Take 500-1,000 mg by mouth every 6 (six) hours as needed (for pain.).     amantadine (SYMMETREL) 100 MG capsule Take 100 mg by mouth 2 (two) times daily.     apixaban (ELIQUIS) 5 MG TABS tablet Take 1 tablet (5 mg total) by mouth 2 (two) times daily. 60 tablet 0   benzonatate (TESSALON) 200 MG capsule Take by mouth.     buPROPion (WELLBUTRIN XL) 150 MG 24 hr tablet Take 1 tablet  by mouth every morning.     cholecalciferol (VITAMIN D3) 25 MCG (1000 UNIT) tablet Take 1,000 Units by mouth daily.      estradiol (ESTRACE) 0.1 MG/GM vaginal cream Discard applicator Apply pea sized amount to tip of finger to urethra before bed. Wash hands well after application. Use Monday, Wednesday and Friday 42.5 g 12   ferrous sulfate 325 (65 FE) MG tablet Take 325 mg by mouth daily  with breakfast.      lidocaine (LIDODERM) 5 % Place onto the skin.     metFORMIN (GLUCOPHAGE) 1000 MG tablet Take 1,000 mg by mouth 2 (two) times daily.     MYRBETRIQ 50 MG TB24 tablet TAKE 1 TABLET(50 MG) BY MOUTH DAILY 30 tablet 11   pantoprazole (PROTONIX) 40 MG tablet Take 1 tablet by mouth daily.     polyethylene glycol (MIRALAX / GLYCOLAX) 17 g packet Take 34 g by mouth daily. 14 each 0   triamcinolone cream (KENALOG) 0.1 % Apply topically 2 (two) times daily.     Trospium Chloride 60 MG CP24 TAKE 1 CAPSULE(60 MG) BY MOUTH DAILY 90 capsule 3   vitamin B-12 (CYANOCOBALAMIN) 500 MCG tablet Take 500 mcg by mouth daily.     furosemide (LASIX) 40 MG tablet Take 40 mg by mouth daily as needed. (Patient not taking: Reported on 10/17/2022)     nitrofurantoin, macrocrystal-monohydrate, (MACROBID) 100 MG capsule Take 100 mg by mouth 2 (two) times daily. (Patient not taking: Reported on 10/17/2022)     No current facility-administered medications for this visit.     PHYSICAL EXAMINATION: ECOG PERFORMANCE STATUS: 2 - Symptomatic, <50% confined to bed Vitals:   10/17/22 1338  BP: 113/87  Pulse: (!) 108  Temp: (!) 97.2 F (36.2 C)  SpO2: 97%   Filed Weights   10/17/22 1338  Weight: 141 lb (64 kg)    Physical Exam Constitutional:      General: She is not in acute distress.    Appearance: She is obese.     Comments: Patient sits in the wheelchair  HENT:     Head: Normocephalic and atraumatic.  Eyes:     General: No scleral icterus. Cardiovascular:     Rate and Rhythm: Regular rhythm. Tachycardia  present.     Heart sounds: Normal heart sounds.  Pulmonary:     Effort: Pulmonary effort is normal. No respiratory distress.     Breath sounds: No wheezing.  Abdominal:     General: Bowel sounds are normal. There is no distension.     Palpations: Abdomen is soft.  Musculoskeletal:        General: No deformity. Normal range of motion.     Cervical back: Normal range of motion and neck supple.     Right lower leg: Edema present.     Left lower leg: Edema present.  Skin:    General: Skin is warm and dry.     Findings: No erythema or rash.  Neurological:     Mental Status: She is alert. Mental status is at baseline.     Cranial Nerves: No cranial nerve deficit.     Coordination: Coordination normal.  Psychiatric:        Mood and Affect: Mood normal.           LABORATORY DATA:  I have reviewed the data as listed    Latest Ref Rng & Units 10/15/2022    2:13 PM 04/10/2022   12:23 PM 03/29/2022    2:40 PM  CBC  WBC 4.0 - 10.5 K/uL 4.8  4.2  3.8   Hemoglobin 12.0 - 15.0 g/dL 11.3  12.0  11.6   Hematocrit 36.0 - 46.0 % 34.4  36.8  36.1   Platelets 150 - 400 K/uL 108  125  132       Latest Ref Rng & Units 07/27/2022    2:32 PM 03/04/2022    5:53 AM 03/03/2022  4:32 AM  CMP  Glucose 70 - 99 mg/dL  134  119   BUN 8 - 23 mg/dL  10  13   Creatinine 0.44 - 1.00 mg/dL 0.40  0.61  0.54   Sodium 135 - 145 mmol/L  138  138   Potassium 3.5 - 5.1 mmol/L  4.1  3.9   Chloride 98 - 111 mmol/L  105  105   CO2 22 - 32 mmol/L  25  25   Calcium 8.9 - 10.3 mg/dL  8.4  8.1    Lab Results  Component Value Date   IRON 55 10/15/2022   TIBC 287 10/15/2022   FERRITIN 40 10/15/2022      RADIOGRAPHIC STUDIES: I have personally reviewed the radiological images as listed and agreed with the findings in the report. DG Chest 2 View  Result Date: 09/09/2022 CLINICAL DATA:  Fall. EXAM: CHEST - 2 VIEW COMPARISON:  Chest radiograph 12/26/2021 and earlier; CT angio chest 03/01/2022 FINDINGS: The  heart size and mediastinal contours are within normal limits. Both lungs are clear. No pleural effusion or pneumothorax. Severe diffuse osteopenia. Anterior cervical spinal fixation. Sequela of prior right humeral fracture. No acute osseous abnormality. IMPRESSION: No acute cardiopulmonary abnormality. No acute osseous abnormality given limitations of severe diffuse osteopenia. Electronically Signed   By: Ileana Roup Whitney.D.   On: 09/09/2022 19:08   CT HEAD WO CONTRAST (5MM)  Result Date: 09/09/2022 CLINICAL DATA:  Head trauma, moderate-severe; Neck trauma (Age >= 65y); Facial trauma, blunt. Fall EXAM: CT HEAD WITHOUT CONTRAST CT MAXILLOFACIAL WITHOUT CONTRAST CT CERVICAL SPINE WITHOUT CONTRAST TECHNIQUE: Multidetector CT imaging of the head, cervical spine, and maxillofacial structures were performed using the standard protocol without intravenous contrast. Multiplanar CT image reconstructions of the cervical spine and maxillofacial structures were also generated. RADIATION DOSE REDUCTION: This exam was performed according to the departmental dose-optimization program which includes automated exposure control, adjustment of the mA and/or kV according to patient size and/or use of iterative reconstruction technique. COMPARISON:  None Available. FINDINGS: CT HEAD FINDINGS BRAIN: BRAIN Brain: Cerebral ventricle sizes are concordant with the degree of cerebral volume loss. Patchy and confluent areas of decreased attenuation are noted throughout the deep and periventricular white matter of the cerebral hemispheres bilaterally, compatible with chronic microvascular ischemic disease. No evidence of large-territorial acute infarction. No parenchymal hemorrhage. No mass lesion. No extra-axial collection. No mass effect or midline shift. No hydrocephalus. Basilar cisterns are patent. Vascular: No hyperdense vessel. Atherosclerotic calcifications are present within the cavernous internal carotid arteries. Skull: No acute  fracture or focal lesion. Sinuses/Orbits: Paranasal sinuses and mastoid air cells are clear. The orbits are unremarkable. Other: None. CT MAXILLOFACIAL FINDINGS Osseous: Age-indeterminate nondisplaced bilateral nasal bone fracture at the junction between nasal bone and nasal process of the maxilla. No definite acute displaced fracture or mandibular dislocation. No destructive process. Bilateral temporomandibular joint degenerative changes. Sinuses/Orbits: Paranasal sinuses and mastoid air cells are clear. Right lens replacement. Otherwise the orbits are unremarkable. Soft tissues: Negative. CT CERVICAL SPINE FINDINGS Alignment: Normal. Skull base and vertebrae: C5-C7 anterior cervical discectomy and fusion. Multilevel mild degenerative changes spine. No acute fracture. No aggressive appearing focal osseous lesion or focal pathologic process. Soft tissues and spinal canal: No prevertebral fluid or swelling. No visible canal hematoma. Upper chest: Unremarkable. Other: Atherosclerotic plaque of the aorta. IMPRESSION: 1. No acute intracranial abnormality. 2. No definite acute displaced facial fracture. Age-indeterminate nondisplaced bilateral nasal bone fracture at the junction between nasal bone and nasal  process of the maxilla. 3. No acute displaced fracture or traumatic listhesis of the cervical spine in a patient with C5-C7 anterior cervical discectomy and fusion. Electronically Signed   By: Iven Finn Whitney.D.   On: 09/09/2022 18:54   CT Maxillofacial Wo Contrast  Result Date: 09/09/2022 CLINICAL DATA:  Head trauma, moderate-severe; Neck trauma (Age >= 65y); Facial trauma, blunt. Fall EXAM: CT HEAD WITHOUT CONTRAST CT MAXILLOFACIAL WITHOUT CONTRAST CT CERVICAL SPINE WITHOUT CONTRAST TECHNIQUE: Multidetector CT imaging of the head, cervical spine, and maxillofacial structures were performed using the standard protocol without intravenous contrast. Multiplanar CT image reconstructions of the cervical spine and  maxillofacial structures were also generated. RADIATION DOSE REDUCTION: This exam was performed according to the departmental dose-optimization program which includes automated exposure control, adjustment of the mA and/or kV according to patient size and/or use of iterative reconstruction technique. COMPARISON:  None Available. FINDINGS: CT HEAD FINDINGS BRAIN: BRAIN Brain: Cerebral ventricle sizes are concordant with the degree of cerebral volume loss. Patchy and confluent areas of decreased attenuation are noted throughout the deep and periventricular white matter of the cerebral hemispheres bilaterally, compatible with chronic microvascular ischemic disease. No evidence of large-territorial acute infarction. No parenchymal hemorrhage. No mass lesion. No extra-axial collection. No mass effect or midline shift. No hydrocephalus. Basilar cisterns are patent. Vascular: No hyperdense vessel. Atherosclerotic calcifications are present within the cavernous internal carotid arteries. Skull: No acute fracture or focal lesion. Sinuses/Orbits: Paranasal sinuses and mastoid air cells are clear. The orbits are unremarkable. Other: None. CT MAXILLOFACIAL FINDINGS Osseous: Age-indeterminate nondisplaced bilateral nasal bone fracture at the junction between nasal bone and nasal process of the maxilla. No definite acute displaced fracture or mandibular dislocation. No destructive process. Bilateral temporomandibular joint degenerative changes. Sinuses/Orbits: Paranasal sinuses and mastoid air cells are clear. Right lens replacement. Otherwise the orbits are unremarkable. Soft tissues: Negative. CT CERVICAL SPINE FINDINGS Alignment: Normal. Skull base and vertebrae: C5-C7 anterior cervical discectomy and fusion. Multilevel mild degenerative changes spine. No acute fracture. No aggressive appearing focal osseous lesion or focal pathologic process. Soft tissues and spinal canal: No prevertebral fluid or swelling. No visible canal  hematoma. Upper chest: Unremarkable. Other: Atherosclerotic plaque of the aorta. IMPRESSION: 1. No acute intracranial abnormality. 2. No definite acute displaced facial fracture. Age-indeterminate nondisplaced bilateral nasal bone fracture at the junction between nasal bone and nasal process of the maxilla. 3. No acute displaced fracture or traumatic listhesis of the cervical spine in a patient with C5-C7 anterior cervical discectomy and fusion. Electronically Signed   By: Iven Finn Whitney.D.   On: 09/09/2022 18:54   CT Cervical Spine Wo Contrast  Result Date: 09/09/2022 CLINICAL DATA:  Head trauma, moderate-severe; Neck trauma (Age >= 65y); Facial trauma, blunt. Fall EXAM: CT HEAD WITHOUT CONTRAST CT MAXILLOFACIAL WITHOUT CONTRAST CT CERVICAL SPINE WITHOUT CONTRAST TECHNIQUE: Multidetector CT imaging of the head, cervical spine, and maxillofacial structures were performed using the standard protocol without intravenous contrast. Multiplanar CT image reconstructions of the cervical spine and maxillofacial structures were also generated. RADIATION DOSE REDUCTION: This exam was performed according to the departmental dose-optimization program which includes automated exposure control, adjustment of the mA and/or kV according to patient size and/or use of iterative reconstruction technique. COMPARISON:  None Available. FINDINGS: CT HEAD FINDINGS BRAIN: BRAIN Brain: Cerebral ventricle sizes are concordant with the degree of cerebral volume loss. Patchy and confluent areas of decreased attenuation are noted throughout the deep and periventricular white matter of the cerebral hemispheres bilaterally, compatible with  chronic microvascular ischemic disease. No evidence of large-territorial acute infarction. No parenchymal hemorrhage. No mass lesion. No extra-axial collection. No mass effect or midline shift. No hydrocephalus. Basilar cisterns are patent. Vascular: No hyperdense vessel. Atherosclerotic calcifications  are present within the cavernous internal carotid arteries. Skull: No acute fracture or focal lesion. Sinuses/Orbits: Paranasal sinuses and mastoid air cells are clear. The orbits are unremarkable. Other: None. CT MAXILLOFACIAL FINDINGS Osseous: Age-indeterminate nondisplaced bilateral nasal bone fracture at the junction between nasal bone and nasal process of the maxilla. No definite acute displaced fracture or mandibular dislocation. No destructive process. Bilateral temporomandibular joint degenerative changes. Sinuses/Orbits: Paranasal sinuses and mastoid air cells are clear. Right lens replacement. Otherwise the orbits are unremarkable. Soft tissues: Negative. CT CERVICAL SPINE FINDINGS Alignment: Normal. Skull base and vertebrae: C5-C7 anterior cervical discectomy and fusion. Multilevel mild degenerative changes spine. No acute fracture. No aggressive appearing focal osseous lesion or focal pathologic process. Soft tissues and spinal canal: No prevertebral fluid or swelling. No visible canal hematoma. Upper chest: Unremarkable. Other: Atherosclerotic plaque of the aorta. IMPRESSION: 1. No acute intracranial abnormality. 2. No definite acute displaced facial fracture. Age-indeterminate nondisplaced bilateral nasal bone fracture at the junction between nasal bone and nasal process of the maxilla. 3. No acute displaced fracture or traumatic listhesis of the cervical spine in a patient with C5-C7 anterior cervical discectomy and fusion. Electronically Signed   By: Iven Finn Whitney.D.   On: 09/09/2022 18:54   CT HEMATURIA WORKUP  Result Date: 07/30/2022 CLINICAL DATA:  Painless gross hematuria. EXAM: CT ABDOMEN AND PELVIS WITHOUT AND WITH CONTRAST TECHNIQUE: Multidetector CT imaging of the abdomen and pelvis was performed following the standard protocol before and following the bolus administration of intravenous contrast. RADIATION DOSE REDUCTION: This exam was performed according to the departmental  dose-optimization program which includes automated exposure control, adjustment of the mA and/or kV according to patient size and/or use of iterative reconstruction technique. CONTRAST:  178m OMNIPAQUE IOHEXOL 300 MG/ML  SOLN COMPARISON:  03/01/2022 CT abdomen/pelvis. FINDINGS: Lower chest: Patchy ground-glass opacity and scattered mild reticulation at both lung bases appears increased. Coronary atherosclerosis. Small to moderate lower esophageal varices. Hepatobiliary: Diffusely irregular liver surface compatible with cirrhosis. No liver masses. Stable venovenous shunt in the lateral segment left liver (series 4/image 21). Normal gallbladder with no radiopaque cholelithiasis. No biliary ductal dilatation. Small periampullary duodenal diverticulum is unchanged. Pancreas: Normal, with no mass or duct dilation. Spleen: Mild splenomegaly. Craniocaudal splenic length 13.1 cm, stable. No splenic masses. Adrenals/Urinary Tract: Stable adrenals without discrete adrenal nodules. No renal stones. No hydronephrosis. Simple 2.7 cm posterior interpolar right renal cortical cyst, additional scattered subcentimeter hypodense bilateral renal cortical lesions and scattered small simple parapelvic bilateral renal cysts, for which no follow-up imaging is recommended. No suspicious renal cortical masses. Normal caliber ureters. No ureteral stones. On delayed imaging, there is no urothelial wall thickening and there are no filling defects in the opacified portions of the bilateral collecting systems or ureters, noting limited evaluation of portions of the lumbar ureters bilaterally due to poor opacification. No bladder stones or masses. Mild diffuse bladder wall thickening, mildly increased from prior CT. Haziness of the perivesical fat, particularly anteriorly. No bladder diverticula. Stomach/Bowel: Normal non-distended stomach. Normal caliber small bowel with no small bowel wall thickening. Appendix not discretely visualized. No  pericecal inflammatory changes. Moderate diffuse colorectal stool. No large bowel wall thickening, diverticulosis or significant pericolonic fat stranding. Vascular/Lymphatic: Atherosclerotic nonaneurysmal abdominal aorta. Patent portal, splenic, hepatic and renal  veins. Small paraumbilical and proximal gastric varices. No pathologically enlarged lymph nodes in the abdomen or pelvis. Reproductive: Status post hysterectomy, with no abnormal findings at the vaginal cuff. No adnexal mass. Other: No pneumoperitoneum, ascites or focal fluid collection. Musculoskeletal: No aggressive appearing focal osseous lesions. Moderate thoracolumbar spondylosis. Chronic moderate L3 and mild L4 vertebral compression fractures. IMPRESSION: 1. No urolithiasis. No hydronephrosis. No suspicious renal cortical masses. No evidence of urothelial lesions, with limitations as described. 2. Mild diffuse bladder wall thickening, mildly increased from prior CT. Haziness of the perivesical fat, particularly anteriorly. Findings are nonspecific with acute cystitis not excluded. Suggest correlation with urinalysis. 3. Cirrhosis. No liver masses. Stable mild splenomegaly. No ascites. Mild-to-moderate gastroesophageal and paraumbilical varices. 4. Moderate diffuse colorectal stool, suggesting constipation. 5. Patchy ground-glass opacity and scattered mild reticulation at both lung bases, increased, nonspecific and with a broad differential. If clinically warranted by respiratory symptoms, high-resolution chest CT could be obtained for further evaluation. 6. Aortic Atherosclerosis (ICD10-I70.0). Electronically Signed   By: Ilona Sorrel Whitney.D.   On: 07/30/2022 14:39

## 2022-11-07 ENCOUNTER — Other Ambulatory Visit: Payer: Self-pay | Admitting: Physician Assistant

## 2022-11-07 ENCOUNTER — Ambulatory Visit (INDEPENDENT_AMBULATORY_CARE_PROVIDER_SITE_OTHER): Payer: Medicare PPO | Admitting: Physician Assistant

## 2022-11-07 VITALS — BP 109/69 | HR 99 | Ht 62.0 in | Wt 141.0 lb

## 2022-11-07 DIAGNOSIS — R310 Gross hematuria: Secondary | ICD-10-CM

## 2022-11-07 NOTE — Progress Notes (Unsigned)
11/07/2022 11:38 AM   Whitney [name] 11-Aug-1941 [id number]  CC: Chief Complaint  Patient presents with   Hematuria   HPI: Whitney [name] is a 81 y.o. 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  He is accompanied today by her daughter, who contributes to HPI.  Today she reports ongoing intermittent gross hematuria without clot passage.  She denies dysuria, fever, chills, nausea, or vomiting.  Overall, she thinks her gross hematuria has remained stable and denies any acute changes.  CBC earlier this month with Dr. Tasia Catchings showed stable hemoglobin, 11.3.  She remains on Eliquis and is scheduled to establish care with Dr. Saralyn Pilar tomorrow.  She continues to use vaginal estrogen cream 2-3 times weekly.  In-office catheterized UA today with brown color and positive for 1+ ketones, 3+ blood, 3+ protein, nitrites, and 1+ leukocytes; urine microscopy with >30 WBCs/HPF, >30 RBCs/HPF, and few bacteria.  PMH: Past Medical History:  Diagnosis Date   Anemia    Arthritis    Broken shoulder    left humerus    COVID-19    Depression    Diabetes mellitus without complication (HCC)    Goiter    Hyperlipidemia    IDA (iron deficiency anemia) 01/11/2022   Overactive bladder    Parkinson's disease    Pneumonia    Right arm weakness    limited movement   Unsteady gait     Surgical History: Past Surgical History:  Procedure Laterality Date   [place]   post hysterectomy   ESOPHAGOGASTRODUODENOSCOPY N/A 12/27/2021   Procedure: ESOPHAGOGASTRODUODENOSCOPY (EGD);  Surgeon: Tol[place], Benay Pike, MD;  Location: [facility] ENDOSCOPY;  Service:  Gastroenterology;  Laterality: N/A;   NECK SURGERY     anterior cervical disc fusion   THYROIDECTOMY Left 08/03/2020   Procedure: THYROIDECTOMY-HEMI;  Surgeon: Clyde Canterbury, MD;  Location: [facility] ORS;  Service: ENT;  Laterality: Left;    Home Medications:  Allergies as of 11/07/2022   No Known Allergies      Medication List        Accurate as of November 07, 2022 11:38 AM. If you have any questions, ask your nurse or doctor.          acetaminophen 500 MG tablet Commonly known as: TYLENOL Take 500-1,000 mg by mouth every 6 (six) hours as needed (for pain.).   amantadine 100 MG capsule Commonly known as: SYMMETREL Take 100 mg by mouth 2 (two) times daily.   apixaban 5 MG Tabs tablet Commonly known as: ELIQUIS Take 1 tablet (5 mg total) by mouth 2 (two) times daily.   benzonatate 200 MG capsule Commonly known as: TESSALON Take by mouth.   buPROPion 150 MG 24 hr tablet Commonly known as: WELLBUTRIN XL Take 1 tablet by mouth every morning.   cholecalciferol 25 MCG (1000 UNIT) tablet Commonly known as: VITAMIN D3 Take 1,000 Units by mouth daily.   cyanocobalamin 500 MCG tablet Commonly known as: VITAMIN B12 Take 500 mcg by mouth daily.   estradiol 0.1 MG/GM vaginal cream Commonly known as: ESTRACE Discard applicator Apply pea sized amount to tip of finger to urethra before bed. Wash hands well after application. Use Monday, Wednesday  and Friday   ferrous sulfate 325 (65 FE) MG tablet Take 325 mg by mouth daily with breakfast.   furosemide 40 MG tablet Commonly known as: LASIX Take 40 mg by mouth daily as needed.   lidocaine 5 % Commonly known as: [place] onto the skin.   metFORMIN 1000 MG tablet Commonly known as: GLUCOPHAGE Take 1,000 mg by mouth 2 (two) times daily.   Myrbetriq 50 MG Tb24 tablet Generic drug: mirabegron ER TAKE 1 TABLET(50 MG) BY MOUTH DAILY   nitrofurantoin (macrocrystal-monohydrate) 100 MG capsule Commonly known as:  MACROBID Take 100 mg by mouth 2 (two) times daily.   pantoprazole 40 MG tablet Commonly known as: PROTONIX Take 1 tablet by mouth daily.   polyethylene glycol 17 g packet Commonly known as: MIRALAX / GLYCOLAX Take 34 g by mouth daily.   triamcinolone cream 0.1 % Commonly known as: KENALOG Apply topically 2 (two) times daily.   Trospium Chloride 60 MG Cp24 TAKE 1 CAPSULE(60 MG) BY MOUTH DAILY        Allergies:  No Known Allergies  Family History: Family History  Problem Relation Age of Onset   Stroke Mother    Hypertension Mother    Diabetes Father    Heart disease Father    Lung cancer Father    Colon cancer Sister    Diabetes Sister    Kidney disease Daughter    Kidney cancer Daughter    Pulmonary embolism Sister    Diabetes Sister    Kidney disease Sister    Heart disease Sister    Atrial fibrillation Sister    Diabetes Brother    Heart disease Brother    AAA (abdominal aortic aneurysm) Brother    Cancer Brother        skin    Social History:   reports that she quit smoking about 3 years ago. Her smoking use included cigarettes. She has a 80.00 pack-year smoking history. She has never used smokeless tobacco. She reports that she does not currently use alcohol. She reports that she does not use drugs.  Physical Exam: BP 109/69   Pulse 99   Ht 5' 2"  (1.575 m)   Wt 141 lb (64 kg)   BMI 25.79 kg/m   Constitutional:  Alert and oriented, no acute distress, nontoxic appearing HEENT: [place], AT Cardiovascular: No clubbing, cyanosis, or edema Respiratory: Normal respiratory effort, no increased work of breathing GU: Clitoral phimosis, sparse pubic hair, anterior labial fusion Skin: No rashes, bruises or suspicious lesions Neurologic: Grossly intact, no focal deficits, moving all 4 extremities Psychiatric: Normal mood and affect  Laboratory Data: Results for orders placed or performed in visit on 11/07/22  Mycoplasma / ureaplasma culture   Specimen: Genital    UR  Result Value Ref Range   Ureaplasma urealyticum Comment Negative   Mycoplasma hominis Culture Comment Negative  Microscopic Examination   Urine  Result Value Ref Range   WBC, UA >30 (A) 0 - 5 /hpf   RBC, Urine >30 (A) 0 - 2 /hpf   Epithelial Cells (non renal) 0-10 0 - 10 /hpf   Bacteria, UA Few None seen/Few  Urinalysis, Complete  Result Value Ref Range   Specific Gravity, UA 1.030 1.005 - 1.030   pH, UA 5.5 5.0 - 7.5   Color, UA Brown (A) Yellow   Appearance Ur Cloudy (A) Clear   Leukocytes,UA 1+ (A) Negative   Protein,UA 3+ (A) Negative/Trace   Glucose, UA Negative Negative   Ketones, UA  1+ (A) Negative   RBC, UA 3+ (A) Negative   Bilirubin, UA Negative Negative   Urobilinogen, Ur 1.0 0.2 - 1.0 mg/dL   Nitrite, UA Positive (A) Negative   Microscopic Examination See below:    Assessment & Plan:   1. Hematuria, gross Ongoing, intermittent gross hematuria.  Fortunately, her blood counts have been rather stable.  Her UA also appears stable, though will send for standard and atypical cultures today and treat as indicated.  Overall, my suspicion for UTI is low at this time especially as she denies dysuria. Positive nitrites on UA are likely false positive due to hematuria.  Will also send her urine for cytology today.  I discussed her case with Dr. Erlene Quan, and we recommend proceeding to the OR for cystoscopy and bladder biopsies under anesthesia.  Patient is in agreement with this plan.  She will need to be off anticoagulation for this, will plan to obtain cardiac clearance from Dr. Saralyn Pilar.  OR orders placed today. - Urinalysis, Complete - Cytology - Non PAP; - CULTURE, URINE COMPREHENSIVE - Mycoplasma / ureaplasma culture   Return for will call with results and to schedule surgery.  Debroah Loop, PA-C  [facility] [address] [place], [place] [address] [phone]

## 2022-11-07 NOTE — Progress Notes (Signed)
Surgical Physician [facility]  Dr. Erlene Quan * Scheduling expectation : Next Available  *Length of Case:   *Clearance needed: yes, cardiac  *Anticoagulation Instructions: Hold all anticoagulants  *Aspirin Instructions: N/A  *Post-op visit Date/Instructions:   TBD  *Diagnosis: Bladder Lesion, gross hematuria  *Procedure: Cysto Bladder Biopsy (56861)   Additional orders: N/A  -Admit type: OUTpatient  -Anesthesia: General  -VTE Prophylaxis Standing Order SCD's       Other:   -Standing Lab Orders Per Anesthesia    Lab other: None  -Standing Test orders EKG/Chest x-ray per Anesthesia       Test other:   - Medications:  Ancef 1gm IV  -Other orders:  N/A

## 2022-11-08 LAB — URINALYSIS, COMPLETE
Bilirubin, UA: NEGATIVE
Glucose, UA: NEGATIVE
Nitrite, UA: POSITIVE — AB
Specific Gravity, UA: 1.03 (ref 1.005–1.030)
Urobilinogen, Ur: 1 mg/dL (ref 0.2–1.0)
pH, UA: 5.5 (ref 5.0–7.5)

## 2022-11-08 LAB — MICROSCOPIC EXAMINATION
RBC, Urine: >30 /hpf — AB (ref 0–2)
WBC, UA: >30 /hpf — AB (ref 0–5)

## 2022-11-09 LAB — CYTOLOGY - NON PAP

## 2022-11-11 LAB — CULTURE, URINE COMPREHENSIVE

## 2022-11-13 ENCOUNTER — Telehealth: Payer: Self-pay

## 2022-11-13 ENCOUNTER — Other Ambulatory Visit: Payer: Self-pay | Admitting: *Deleted

## 2022-11-13 LAB — MYCOPLASMA / UREAPLASMA CULTURE
Mycoplasma hominis Culture: NEGATIVE
Ureaplasma urealyticum: NEGATIVE

## 2022-11-13 MED ORDER — SULFAMETHOXAZOLE-TRIMETHOPRIM 800-160 MG PO TABS: 1.0000 | ORAL_TABLET | Freq: Two times a day (BID) | ORAL | 0 refills | Status: AC

## 2022-11-13 NOTE — Telephone Encounter (Signed)
Called on 11/08/2022 and spoke with daughter who reported she could not talk at the time and would call on 12/01. No call received, I called back today 11/13/2022 and left a detailed message to call back to schedule surgery. Will try again.

## 2022-11-29 NOTE — Telephone Encounter (Signed)
Left message for daughter to call back to schedule surgery. Will try again.

## 2022-12-01 ENCOUNTER — Inpatient Hospital Stay
Admission: EM | Admit: 2022-12-01 | Discharge: 2022-12-06 | DRG: 871 | Disposition: A | Discharge status: Hospice | Payer: Medicare PPO | Source: Skilled Nursing Facility | Attending: Internal Medicine | Admitting: Internal Medicine

## 2022-12-01 ENCOUNTER — Inpatient Hospital Stay: Payer: Medicare PPO

## 2022-12-01 ENCOUNTER — Emergency Department: Payer: Medicare PPO

## 2022-12-01 DIAGNOSIS — Z66 Do not resuscitate: Secondary | ICD-10-CM | POA: Diagnosis not present

## 2022-12-01 DIAGNOSIS — D649 Anemia, unspecified: Secondary | ICD-10-CM | POA: Diagnosis not present

## 2022-12-01 DIAGNOSIS — R54 Age-related physical debility: Secondary | ICD-10-CM | POA: Diagnosis present

## 2022-12-01 DIAGNOSIS — D509 Iron deficiency anemia, unspecified: Secondary | ICD-10-CM | POA: Diagnosis present

## 2022-12-01 DIAGNOSIS — F329 Major depressive disorder, single episode, unspecified: Secondary | ICD-10-CM | POA: Diagnosis present

## 2022-12-01 DIAGNOSIS — R310 Gross hematuria: Secondary | ICD-10-CM | POA: Diagnosis not present

## 2022-12-01 DIAGNOSIS — F32A Depression, unspecified: Secondary | ICD-10-CM | POA: Diagnosis present

## 2022-12-01 DIAGNOSIS — I272 Pulmonary hypertension, unspecified: Secondary | ICD-10-CM | POA: Diagnosis present

## 2022-12-01 DIAGNOSIS — R6520 Severe sepsis without septic shock: Secondary | ICD-10-CM | POA: Diagnosis present

## 2022-12-01 DIAGNOSIS — N179 Acute kidney failure, unspecified: Secondary | ICD-10-CM

## 2022-12-01 DIAGNOSIS — I8500 Esophageal varices without bleeding: Secondary | ICD-10-CM | POA: Diagnosis present

## 2022-12-01 DIAGNOSIS — Z823 Family history of stroke: Secondary | ICD-10-CM

## 2022-12-01 DIAGNOSIS — Z8249 Family history of ischemic heart disease and other diseases of the circulatory system: Secondary | ICD-10-CM

## 2022-12-01 DIAGNOSIS — K766 Portal hypertension: Secondary | ICD-10-CM | POA: Diagnosis present

## 2022-12-01 DIAGNOSIS — E870 Hyperosmolality and hypernatremia: Secondary | ICD-10-CM | POA: Diagnosis present

## 2022-12-01 DIAGNOSIS — T45515A Adverse effect of anticoagulants, initial encounter: Secondary | ICD-10-CM | POA: Diagnosis present

## 2022-12-01 DIAGNOSIS — E890 Postprocedural hypothyroidism: Secondary | ICD-10-CM | POA: Diagnosis present

## 2022-12-01 DIAGNOSIS — Z993 Dependence on wheelchair: Secondary | ICD-10-CM

## 2022-12-01 DIAGNOSIS — E860 Dehydration: Secondary | ICD-10-CM | POA: Diagnosis present

## 2022-12-01 DIAGNOSIS — D684 Acquired coagulation factor deficiency: Secondary | ICD-10-CM | POA: Diagnosis present

## 2022-12-01 DIAGNOSIS — E119 Type 2 diabetes mellitus without complications: Secondary | ICD-10-CM | POA: Diagnosis present

## 2022-12-01 DIAGNOSIS — A4151 Sepsis due to Escherichia coli [E. coli]: Secondary | ICD-10-CM | POA: Diagnosis present

## 2022-12-01 DIAGNOSIS — K7460 Unspecified cirrhosis of liver: Secondary | ICD-10-CM | POA: Diagnosis present

## 2022-12-01 DIAGNOSIS — G9341 Metabolic encephalopathy: Secondary | ICD-10-CM

## 2022-12-01 DIAGNOSIS — N390 Urinary tract infection, site not specified: Secondary | ICD-10-CM | POA: Diagnosis present

## 2022-12-01 DIAGNOSIS — I8510 Secondary esophageal varices without bleeding: Secondary | ICD-10-CM | POA: Diagnosis present

## 2022-12-01 DIAGNOSIS — D62 Acute posthemorrhagic anemia: Secondary | ICD-10-CM

## 2022-12-01 DIAGNOSIS — Z8616 Personal history of COVID-19: Secondary | ICD-10-CM

## 2022-12-01 DIAGNOSIS — Z86711 Personal history of pulmonary embolism: Secondary | ICD-10-CM | POA: Insufficient documentation

## 2022-12-01 DIAGNOSIS — N3281 Overactive bladder: Secondary | ICD-10-CM | POA: Diagnosis present

## 2022-12-01 DIAGNOSIS — Z9889 Other specified postprocedural states: Secondary | ICD-10-CM

## 2022-12-01 DIAGNOSIS — Z87891 Personal history of nicotine dependence: Secondary | ICD-10-CM

## 2022-12-01 DIAGNOSIS — Z79899 Other long term (current) drug therapy: Secondary | ICD-10-CM

## 2022-12-01 DIAGNOSIS — E872 Acidosis, unspecified: Secondary | ICD-10-CM | POA: Diagnosis present

## 2022-12-01 DIAGNOSIS — I2782 Chronic pulmonary embolism: Secondary | ICD-10-CM | POA: Diagnosis present

## 2022-12-01 DIAGNOSIS — D696 Thrombocytopenia, unspecified: Secondary | ICD-10-CM | POA: Diagnosis present

## 2022-12-01 DIAGNOSIS — I493 Ventricular premature depolarization: Secondary | ICD-10-CM | POA: Diagnosis not present

## 2022-12-01 DIAGNOSIS — Z841 Family history of disorders of kidney and ureter: Secondary | ICD-10-CM

## 2022-12-01 DIAGNOSIS — A419 Sepsis, unspecified organism: Secondary | ICD-10-CM | POA: Diagnosis present

## 2022-12-01 DIAGNOSIS — E878 Other disorders of electrolyte and fluid balance, not elsewhere classified: Secondary | ICD-10-CM | POA: Diagnosis present

## 2022-12-01 DIAGNOSIS — Z515 Encounter for palliative care: Secondary | ICD-10-CM | POA: Diagnosis not present

## 2022-12-01 DIAGNOSIS — E8721 Acute metabolic acidosis: Secondary | ICD-10-CM | POA: Diagnosis present

## 2022-12-01 DIAGNOSIS — E785 Hyperlipidemia, unspecified: Secondary | ICD-10-CM | POA: Diagnosis present

## 2022-12-01 DIAGNOSIS — Z7984 Long term (current) use of oral hypoglycemic drugs: Secondary | ICD-10-CM

## 2022-12-01 DIAGNOSIS — Z1152 Encounter for screening for COVID-19: Secondary | ICD-10-CM | POA: Diagnosis not present

## 2022-12-01 DIAGNOSIS — G20A1 Parkinson's disease without dyskinesia, without mention of fluctuations: Secondary | ICD-10-CM | POA: Diagnosis present

## 2022-12-01 DIAGNOSIS — Z833 Family history of diabetes mellitus: Secondary | ICD-10-CM

## 2022-12-01 DIAGNOSIS — D6832 Hemorrhagic disorder due to extrinsic circulating anticoagulants: Secondary | ICD-10-CM | POA: Diagnosis present

## 2022-12-01 DIAGNOSIS — Z8051 Family history of malignant neoplasm of kidney: Secondary | ICD-10-CM

## 2022-12-01 DIAGNOSIS — Z800 Family history of malignant neoplasm of digestive organs: Secondary | ICD-10-CM

## 2022-12-01 DIAGNOSIS — K922 Gastrointestinal hemorrhage, unspecified: Secondary | ICD-10-CM

## 2022-12-01 DIAGNOSIS — Z6822 Body mass index (BMI) 22.0-22.9, adult: Secondary | ICD-10-CM

## 2022-12-01 DIAGNOSIS — K3189 Other diseases of stomach and duodenum: Secondary | ICD-10-CM | POA: Diagnosis present

## 2022-12-01 DIAGNOSIS — Z801 Family history of malignant neoplasm of trachea, bronchus and lung: Secondary | ICD-10-CM

## 2022-12-01 DIAGNOSIS — Z7901 Long term (current) use of anticoagulants: Secondary | ICD-10-CM

## 2022-12-01 LAB — CBC WITH DIFFERENTIAL/PLATELET
Abs Immature Granulocytes: 0.03 10*3/uL (ref 0.00–0.07)
Basophils Absolute: 0 10*3/uL (ref 0.0–0.1)
Basophils Relative: 0 %
Eosinophils Absolute: 0 10*3/uL (ref 0.0–0.5)
Eosinophils Relative: 0 %
HCT: 29.9 % — ABNORMAL LOW (ref 36.0–46.0)
Hemoglobin: 9.5 g/dL — ABNORMAL LOW (ref 12.0–15.0)
Immature Granulocytes: 0 %
Lymphocytes Relative: 17 %
Lymphs Abs: 1.2 10*3/uL (ref 0.7–4.0)
MCH: 31.1 pg (ref 26.0–34.0)
MCHC: 31.8 g/dL (ref 30.0–36.0)
MCV: 98 fL (ref 80.0–100.0)
Monocytes Absolute: 0.4 10*3/uL (ref 0.1–1.0)
Monocytes Relative: 5 %
Neutro Abs: 5.5 10*3/uL (ref 1.7–7.7)
Neutrophils Relative %: 78 %
Platelets: 139 10*3/uL — ABNORMAL LOW (ref 150–400)
RBC: 3.05 MIL/uL — ABNORMAL LOW (ref 3.87–5.11)
RDW: 20 % — ABNORMAL HIGH (ref 11.5–15.5)
WBC: 7.2 10*3/uL (ref 4.0–10.5)
nRBC: 0 % (ref 0.0–0.2)

## 2022-12-01 LAB — TYPE AND SCREEN
ABO/RH(D): A NEG
Antibody Screen: NEGATIVE

## 2022-12-01 LAB — URINALYSIS, COMPLETE (UACMP) WITH MICROSCOPIC
RBC / HPF: >50 RBC/hpf — ABNORMAL HIGH (ref 0–5)
Specific Gravity, Urine: 1.017 (ref 1.005–1.030)
Squamous Epithelial / HPF: >50 — ABNORMAL HIGH (ref 0–5)
WBC, UA: >50 WBC/hpf — ABNORMAL HIGH (ref 0–5)

## 2022-12-01 LAB — RESP PANEL BY RT-PCR (RSV, FLU A&B, COVID)  RVPGX2
Influenza A by PCR: NEGATIVE
Influenza B by PCR: NEGATIVE
Resp Syncytial Virus by PCR: NEGATIVE
SARS Coronavirus 2 by RT PCR: NEGATIVE

## 2022-12-01 LAB — COMPREHENSIVE METABOLIC PANEL
ALT: 40 U/L (ref 0–44)
AST: 58 U/L — ABNORMAL HIGH (ref 15–41)
Albumin: 2.6 g/dL — ABNORMAL LOW (ref 3.5–5.0)
Alkaline Phosphatase: 124 U/L (ref 38–126)
Anion gap: 20 — ABNORMAL HIGH (ref 5–15)
BUN: 69 mg/dL — ABNORMAL HIGH (ref 8–23)
CO2: 11 mmol/L — ABNORMAL LOW (ref 22–32)
Calcium: 8.5 mg/dL — ABNORMAL LOW (ref 8.9–10.3)
Chloride: 118 mmol/L — ABNORMAL HIGH (ref 98–111)
Creatinine, Ser: 2.01 mg/dL — ABNORMAL HIGH (ref 0.44–1.00)
GFR, Estimated: 24 mL/min — ABNORMAL LOW (ref >60)
Glucose, Bld: 71 mg/dL (ref 70–99)
Potassium: 4.6 mmol/L (ref 3.5–5.1)
Sodium: 149 mmol/L — ABNORMAL HIGH (ref 135–145)
Total Bilirubin: 3.5 mg/dL — ABNORMAL HIGH (ref 0.3–1.2)
Total Protein: 6.4 g/dL — ABNORMAL LOW (ref 6.5–8.1)

## 2022-12-01 LAB — APTT: aPTT: 43 seconds — ABNORMAL HIGH (ref 24–36)

## 2022-12-01 LAB — PROTIME-INR
INR: 4.3 (ref 0.8–1.2)
Prothrombin Time: 41 seconds — ABNORMAL HIGH (ref 11.4–15.2)

## 2022-12-01 LAB — HEMOGLOBIN: Hemoglobin: 8.3 g/dL — ABNORMAL LOW (ref 12.0–15.0)

## 2022-12-01 LAB — LACTIC ACID, PLASMA
Lactic Acid, Venous: 7.2 mmol/L (ref 0.5–1.9)
Lactic Acid, Venous: 6 mmol/L (ref 0.5–1.9)

## 2022-12-01 MED ORDER — ACETAMINOPHEN 650 MG RE SUPP
650.0000 mg | Freq: Four times a day (QID) | RECTAL | Status: DC | PRN
  Administered 2022-12-02 – 2022-12-04 (×2): 650 mg via RECTAL
  Filled 2022-12-01: qty 2
  Filled 2022-12-01: qty 1

## 2022-12-01 MED ORDER — SODIUM CHLORIDE 0.9 % IV SOLN: 2.0000 g | Freq: Once | INTRAVENOUS | Status: DC

## 2022-12-01 MED ORDER — ACETAMINOPHEN 325 MG PO TABS: 650.0000 mg | ORAL_TABLET | Freq: Four times a day (QID) | ORAL | Status: DC | PRN

## 2022-12-01 MED ORDER — OCTREOTIDE LOAD VIA INFUSION
50.0000 ug | Freq: Once | INTRAVENOUS | Status: AC
  Administered 2022-12-01: 50 ug via INTRAVENOUS
  Filled 2022-12-01 (×2): qty 25

## 2022-12-01 MED ORDER — SODIUM CHLORIDE 0.9 % IV SOLN
1.0000 g | Freq: Once | INTRAVENOUS | Status: AC
  Administered 2022-12-01: 1 g via INTRAVENOUS
  Filled 2022-12-01: qty 10

## 2022-12-01 MED ORDER — SODIUM CHLORIDE 0.9 % IV SOLN
50.0000 ug/h | INTRAVENOUS | Status: DC
  Administered 2022-12-01: 50 ug/h via INTRAVENOUS
  Filled 2022-12-01 (×5): qty 1

## 2022-12-01 MED ORDER — PANTOPRAZOLE 80MG IVPB - SIMPLE MED
80.0000 mg | Freq: Once | INTRAVENOUS | Status: AC
  Administered 2022-12-01: 80 mg via INTRAVENOUS
  Filled 2022-12-01: qty 100

## 2022-12-01 MED ORDER — VITAMIN K1 10 MG/ML IJ SOLN
5.0000 mg | Freq: Once | INTRAVENOUS | Status: AC
  Administered 2022-12-02: 5 mg via INTRAVENOUS
  Filled 2022-12-01: qty 0.5

## 2022-12-01 MED ORDER — LACTATED RINGERS IV BOLUS
30.0000 mL/kg | Freq: Once | INTRAVENOUS | Status: AC
  Administered 2022-12-01: 2040 mL via INTRAVENOUS

## 2022-12-01 MED ORDER — PANTOPRAZOLE INFUSION (NEW) - SIMPLE MED
8.0000 mg/h | INTRAVENOUS | Status: DC
  Administered 2022-12-01 – 2022-12-03 (×4): 8 mg/h via INTRAVENOUS
  Filled 2022-12-01 (×4): qty 100

## 2022-12-01 MED ORDER — LACTATED RINGERS IV SOLN: INTRAVENOUS | Status: DC

## 2022-12-01 MED ORDER — LACTATED RINGERS IV BOLUS
1000.0000 mL | Freq: Once | INTRAVENOUS | Status: AC
  Administered 2022-12-01: 1000 mL via INTRAVENOUS

## 2022-12-01 MED ORDER — SODIUM CHLORIDE 0.9 % IV SOLN
1.0000 g | INTRAVENOUS | Status: DC
  Administered 2022-12-01: 1 g via INTRAVENOUS
  Filled 2022-12-01: qty 10

## 2022-12-01 MED ORDER — PANTOPRAZOLE SODIUM 40 MG IV SOLR
40.0000 mg | Freq: Two times a day (BID) | INTRAVENOUS | Status: DC
  Administered 2022-12-05 – 2022-12-06 (×3): 40 mg via INTRAVENOUS
  Filled 2022-12-01 (×3): qty 10

## 2022-12-01 NOTE — IPAL (Signed)
  Interdisciplinary Goals of Care Family Meeting   Date carried out: 12/01/2022  Location of the meeting: Bedside  Member's involved: Physician, Bedside Registered Nurse, and Family Member or next of kin daughter and POA [place] or acting medical decision maker: Daughter Craig Staggers  Discussion: We discussed goals of care for [employer] .  Discussed multiple severe acute illnesses, underlying chronic comorbidities and frailty, possibility of decompensation.  Plan of care and specialist that will be involved in care.  Patient is DNR and comfort care if she declines  Code status: DNR  Disposition: Continue current acute care  Time spent for the meeting: [place], MD  12/01/2022, 10:13 PM

## 2022-12-01 NOTE — Assessment & Plan Note (Addendum)
On amantadine Delirium precautions

## 2022-12-01 NOTE — Assessment & Plan Note (Signed)
Acute metabolic acidosis Creatinine 2.0 up from 0.40 a month ago with bicarb of 11 Secondary to sepsis, lactic acidosis IV fluid resuscitation, monitor renal function and avoid nephrotoxins

## 2022-12-01 NOTE — Assessment & Plan Note (Signed)
Hold meds while n.p.o.

## 2022-12-01 NOTE — ED Notes (Signed)
Dr Archie Balboa notified 1740 lactic acid 7.2

## 2022-12-01 NOTE — Assessment & Plan Note (Signed)
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

## 2022-12-01 NOTE — Assessment & Plan Note (Signed)
Hyperchloremia Sodium 149 with chloride 111 Likely secondary to dehydration and poor oral intake related to acute illness Fluid resuscitation LR and monitor.  May consider switching to D5 once stabilized

## 2022-12-01 NOTE — Assessment & Plan Note (Addendum)
Patient with confusion and lethargy Delirium precautions, fall and aspiration precautions

## 2022-12-01 NOTE — ED Triage Notes (Signed)
Pt brought to [facility] via [facility] from [facility] for Lethargy, confusion. Pt being treated for uti but per EMS has not been getting her antibiotics continuously at facility. Pt altered on arrival will not answer any questions.VSS on arrival

## 2022-12-01 NOTE — Assessment & Plan Note (Signed)
Stable.  Platelet 1 39,000

## 2022-12-01 NOTE — Assessment & Plan Note (Addendum)
Chronic anticoagulation Gross hematuria Possible coffee-ground emesis Hemoglobin 9.5 down from 11.3 a month prior.  Platelets 139,000. Presumed acute blood loss with some concern for GI etiology given coffee-ground stains around mouth on arrival to the ED Possibility of chronic blood loss from ongoing gross hematuria Serial H&H Type cross and transfuse if necessary GI consult for possible GI related bleeding.Marland KitchenMarland KitchenDiscussed with Dr Vicente Males: Will see in am ..." won't be able to scope until Tuesday 2 days off eloquis unless she has a significant bleed ..." , Hold Eliquis.  SCDs for DVT prophylaxis

## 2022-12-01 NOTE — Assessment & Plan Note (Addendum)
Elevated bilirubin of 3.5 with AST 58 and ALT 40.  Bilirubin a month ago was 0.7 Secondary to severe sepsis however CT abdomen and pelvis showing concerning findings as outlined below:  "Interval development of innumerable subcentimeter hypodensities.Marland KitchenMarland KitchenDifferential diagnosis would include multifocal HCC, metastatic disease, atypical presentation of regenerative nodules in cirrhosis, or less likely infectious etiologies to include fungal organisms...."  Aggressive treatment of sepsis and continue to monitor Consider MRI as recommended by radiologist

## 2022-12-01 NOTE — Assessment & Plan Note (Addendum)
Possible upper GI bleed (see management of ABLA above) Elevated bilirubin suspect related to sepsis GI has been consulted

## 2022-12-01 NOTE — Assessment & Plan Note (Addendum)
Complicated urinary tract infection Gross hematuria s/p cystoscopy with bladder biopsy 11/07/2022, cytology negative History of Staphylococcus haemolyticus UTI 11/07/2022 Severe sepsis criteria includes tachycardia and lactic acidosis, AKI, hyperbilirubinemia and altered mental status Continue sepsis fluids Will switch antibiotics to cefepime in view of fairly recent instrumentation on 11/29 Of note patient was prescribed Bactrim on 11/29 but family reports that administration has not been regular. Follow cultures Follow CT abdomen and pelvis "Persistent bladder wall thickening. This may be in part due to history of prior bladder repair, though superimposed cystitis cannot be completely excluded" Consider urology consult

## 2022-12-01 NOTE — Assessment & Plan Note (Signed)
Sliding scale insulin coverage.  Hold home metformin in view of AKI

## 2022-12-01 NOTE — Consult Note (Signed)
Pharmacy Antibiotic Note  Whitney [name] is a 81 y.o. female admitted on 12/01/2022 with UTI.  Pharmacy has been consulted for cefepime dosing.  Plan: Cefepime 1g IV every 24 hours Continue to monitor and dose adjust antibiotics according to renal function and indication  Height: 5' 3"  (160 cm) Weight: 68 kg (149 lb 14.6 oz) IBW/kg (Calculated) : 52.4  Temp (24hrs), Avg:98 F (36.7 C), Min:97.7 F (36.5 C), Max:98.2 F (36.8 C)  Recent Labs  Lab 12/01/22 1605 12/01/22 1744  WBC 7.2  --   CREATININE 2.01*  --   LATICACIDVEN 7.2* 6.0*    Estimated Creatinine Clearance: 20.3 mL/min (A) (by C-G formula based on SCr of 2.01 mg/dL (H)).    No Known Allergies  Antimicrobials this admission: 12/23 Rocephin IV x1 12/23 Cefepime >>    Microbiology results: 12/23 BCx: sent 12/23 UCx: sent   Thank you for allowing pharmacy to be a part of this patient's care.  Darrick Penna 12/01/2022 9:35 PM

## 2022-12-01 NOTE — H&P (Addendum)
History and Physical    Patient: Whitney [name] BTD:[id number] DOB: 04-27-1941 DOA: 12/01/2022 DOS: the patient was seen and examined on 12/01/2022 PCP: Idelle Crouch, MD  Patient coming from: Home  Chief Complaint:  Chief Complaint  Patient presents with   Altered Mental Status    HPI: Whitney [name] is a 81 y.o. 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  According to reports given by EMS, patient has not been getting antibiotics as prescribed for unclear reason.  Of note cytology was negative.  Patient is unable to contribute to history due to altered mental status due to confusion and mumbling speech. ED course and data review: Afebrile on arrival, BP 114/56 with pulse 110, respirations up to 43 with O2 sat of 100% on room air.  WBC 7200 with lactic acid 7.2-6.  Hemoglobin 9.5 down from 11.3 a month prior.  Platelets 139,000.  Chemistries showed sodium 149 and chloride 111.  Creatinine 2.0 up from 0.40 a month ago with bicarb of 11.  Elevated bilirubin of 3.5 with AST 58 and ALT 40.  Bilirubin a month ago was 0.7.  Urinalysis showing urine red in color with many bacteria.  COVID flu and RSV negative. EKG, personally viewed and interpreted showing sinus tach with nonspecific ST-T wave changes Chest x-ray nonacute Patient started on sepsis fluids as well as Rocephin.  On physical exam by ED provider she was  noted to have dried coffee-ground emesis material around her mouth and she was empirically started on Protonix and Sandostatin in view of history of esophageal varices and chronic anticoagulation. Hospitalist consulted for admission.     Past Medical History:  Diagnosis Date   Anemia    Arthritis    Broken shoulder    left humerus    COVID-19    Depression    Diabetes mellitus without complication ([place])    Goiter    Hyperlipidemia    IDA (iron deficiency anemia) 01/11/2022   Overactive bladder    Parkinson's disease    Pneumonia    Right arm weakness    limited movement   Unsteady gait    Past Surgical History:  Procedure Laterality Date   [place]   post hysterectomy   ESOPHAGOGASTRODUODENOSCOPY N/A 12/27/2021   Procedure: ESOPHAGOGASTRODUODENOSCOPY (EGD);  Surgeon: Toledo, Benay Pike, MD;  Location: [facility] ENDOSCOPY;  Service: Gastroenterology;  Laterality: N/A;   NECK SURGERY     anterior cervical disc fusion   THYROIDECTOMY Left 08/03/2020   Procedure: THYROIDECTOMY-HEMI;  Surgeon: Clyde Canterbury, MD;  Location: [facility] ORS;  Service: ENT;  Laterality: Left;   Social History:  reports that she quit smoking about 3 years ago. Her smoking use included cigarettes. She has a 80.00 pack-year smoking history. She has never used smokeless tobacco. She reports that she does not currently use alcohol. She reports that she does not use drugs.  No Known Allergies  Family History  Problem Relation Age of Onset   Stroke Mother  Hypertension Mother    Diabetes Father    Heart disease Father    Lung cancer Father    Colon cancer Sister    Diabetes Sister    Kidney disease Daughter    Kidney cancer Daughter    Pulmonary embolism Sister    Diabetes Sister    Kidney disease Sister    Heart disease Sister    Atrial fibrillation Sister    Diabetes Brother    Heart disease Brother    AAA (abdominal aortic aneurysm) Brother    Cancer Brother         skin    Prior to Admission medications   Medication Sig Start Date End Date Taking? Authorizing Provider  acetaminophen (TYLENOL) 500 MG tablet Take 500-1,000 mg by mouth every 6 (six) hours as needed (for pain.).    [provider]  Amantadine HCl 100 MG tablet Take 100 mg by mouth 2 (two) times daily. 11/12/22   [provider]  apixaban (ELIQUIS) 5 MG TABS tablet Take 1 tablet (5 mg total) by mouth 2 (two) times daily. 03/11/22   Sharen Hones, MD  benzonatate (TESSALON) 200 MG capsule Take by mouth. 10/15/22   [provider]  buPROPion (WELLBUTRIN XL) 150 MG 24 hr tablet Take 1 tablet by mouth every morning. 02/14/22   [provider]  cholecalciferol (VITAMIN D3) 25 MCG (1000 UNIT) tablet Take 1,000 Units by mouth daily.     [provider]  estradiol (ESTRACE) 0.1 MG/GM vaginal cream Discard applicator Apply pea sized amount to tip of finger to urethra before bed. Wash hands well after application. Use Monday, Wednesday and Friday 08/02/22   Hollice Espy, MD  ferrous sulfate 325 (65 FE) MG tablet Take 325 mg by mouth daily with breakfast.     [provider]  furosemide (LASIX) 40 MG tablet Take 40 mg by mouth daily as needed. 01/12/22   [provider]  lidocaine (LIDODERM) 5 % Place onto the skin. 07/23/22   [provider]  metFORMIN (GLUCOPHAGE) 1000 MG tablet Take 1,000 mg by mouth 2 (two) times daily. 08/20/22   [provider]  MYRBETRIQ 50 MG TB24 tablet TAKE 1 TABLET(50 MG) BY MOUTH DAILY 07/20/21   Vaillancourt, Aldona Bar, PA-C  nitrofurantoin, macrocrystal-monohydrate, (MACROBID) 100 MG capsule Take 100 mg by mouth 2 (two) times daily. 09/20/22   [provider]  pantoprazole (PROTONIX) 40 MG tablet Take 1 tablet by mouth daily. 07/23/22   [provider]  polyethylene glycol (MIRALAX / GLYCOLAX) 17 g packet Take 34 g by mouth daily. 03/06/22   Sharen Hones, MD  triamcinolone cream  (KENALOG) 0.1 % Apply topically 2 (two) times daily. 01/19/22   [provider]  Trospium Chloride 60 MG CP24 TAKE 1 CAPSULE(60 MG) BY MOUTH DAILY 09/19/21   Vaillancourt, Samantha, PA-C  vitamin B-12 (CYANOCOBALAMIN) 500 MCG tablet Take 500 mcg by mouth daily.    [provider]    Physical Exam: Vitals:   12/01/22 1551 12/01/22 1553 12/01/22 1745 12/01/22 2021  BP:   (!) 114/56 124/74  Pulse: (!) 110  (!) 107 (!) 102  Resp: 16  (!) 43 (!) 22  Temp: 98.2 F (36.8 C)   97.7 F (36.5 C)  TempSrc: Oral   Oral  SpO2: 98%  100% 96%  Weight:  68 kg    Height:  5' 3"  (1.6 m)     Physical Exam Vitals and nursing note reviewed.  Constitutional:  General: She is not in acute distress.    Comments: Frail-appearing female, appears confused, disoriented, mumbling incomprehensibly  HENT:     Head: Normocephalic and atraumatic.     Mouth/Throat:     Comments: Inside of mouth with dried black material Cardiovascular:     Rate and Rhythm: Regular rhythm. Tachycardia present.     Heart sounds: Normal heart sounds.  Pulmonary:     Effort: Tachypnea present.     Breath sounds: Normal breath sounds.  Abdominal:     Palpations: Abdomen is soft.     Tenderness: There is no abdominal tenderness.  Neurological:     General: No focal deficit present.     Mental Status: She is lethargic.     Labs on Admission: I have personally reviewed following labs and imaging studies  CBC: Recent Labs  Lab 12/01/22 1605  WBC 7.2  NEUTROABS 5.5  HGB 9.5*  HCT 29.9*  MCV 98.0  PLT 283*   Basic Metabolic Panel: Recent Labs  Lab 12/01/22 1605  NA 149*  K 4.6  CL 118*  CO2 11*  GLUCOSE 71  BUN 69*  CREATININE 2.01*  CALCIUM 8.5*   GFR: Estimated Creatinine Clearance: 20.3 mL/min (A) (by C-G formula based on SCr of 2.01 mg/dL (H)). Liver Function Tests: Recent Labs  Lab 12/01/22 1605  AST 58*  ALT 40  ALKPHOS 124  BILITOT 3.5*  PROT 6.4*  ALBUMIN 2.6*   No  results for input(s): "LIPASE", "AMYLASE" in the last 168 hours. No results for input(s): "AMMONIA" in the last 168 hours. Coagulation Profile: No results for input(s): "INR", "PROTIME" in the last 168 hours. Cardiac Enzymes: No results for input(s): "CKTOTAL", "CKMB", "CKMBINDEX", "TROPONINI" in the last 168 hours. BNP (last 3 results) No results for input(s): "PROBNP" in the last 8760 hours. HbA1C: No results for input(s): "HGBA1C" in the last 72 hours. CBG: No results for input(s): "GLUCAP" in the last 168 hours. Lipid Profile: No results for input(s): "CHOL", "HDL", "LDLCALC", "TRIG", "CHOLHDL", "LDLDIRECT" in the last 72 hours. Thyroid Function Tests: No results for input(s): "TSH", "T4TOTAL", "FREET4", "T3FREE", "THYROIDAB" in the last 72 hours. Anemia Panel: No results for input(s): "VITAMINB12", "FOLATE", "FERRITIN", "TIBC", "IRON", "RETICCTPCT" in the last 72 hours. Urine analysis:    Component Value Date/Time   COLORURINE RED (A) 12/01/2022 1621   APPEARANCEUR TURBID (A) 12/01/2022 1621   APPEARANCEUR Cloudy (A) 11/07/2022 1154   LABSPEC 1.017 12/01/2022 1621   PHURINE  12/01/2022 1621    TEST NOT REPORTED DUE TO COLOR INTERFERENCE OF URINE PIGMENT   GLUCOSEU (A) 12/01/2022 1621    TEST NOT REPORTED DUE TO COLOR INTERFERENCE OF URINE PIGMENT   HGBUR (A) 12/01/2022 1621    TEST NOT REPORTED DUE TO COLOR INTERFERENCE OF URINE PIGMENT   BILIRUBINUR (A) 12/01/2022 1621    TEST NOT REPORTED DUE TO COLOR INTERFERENCE OF URINE PIGMENT   BILIRUBINUR Negative 11/07/2022 1154   KETONESUR (A) 12/01/2022 1621    TEST NOT REPORTED DUE TO COLOR INTERFERENCE OF URINE PIGMENT   PROTEINUR (A) 12/01/2022 1621    TEST NOT REPORTED DUE TO COLOR INTERFERENCE OF URINE PIGMENT   NITRITE (A) 12/01/2022 1621    TEST NOT REPORTED DUE TO COLOR INTERFERENCE OF URINE PIGMENT   LEUKOCYTESUR (A) 12/01/2022 1621    TEST NOT REPORTED DUE TO COLOR INTERFERENCE OF URINE PIGMENT    Radiological  Exams on Admission: CT RENAL STONE STUDY  Result Date: 12/01/2022 CLINICAL DATA:  Hematuria, urinary  tract infection EXAM: CT ABDOMEN AND PELVIS WITHOUT CONTRAST TECHNIQUE: Multidetector CT imaging of the abdomen and pelvis was performed following the standard protocol without IV contrast. Unenhanced CT was performed per clinician order. Lack of IV contrast limits sensitivity and specificity, especially for evaluation of abdominal/pelvic solid viscera. RADIATION DOSE REDUCTION: This exam was performed according to the departmental dose-optimization program which includes automated exposure control, adjustment of the mA and/or kV according to patient size and/or use of iterative reconstruction technique. COMPARISON:  07/27/2022 FINDINGS: Lower chest: No acute pleural or parenchymal lung disease. Hepatobiliary: Since the previous exam, innumerable subcentimeter hypodensities are seen throughout the liver parenchyma. These are superimposed upon background cirrhosis. No intrahepatic biliary duct dilation. The gallbladder is moderately distended, with gallbladder sludge layering dependently. No evidence of cholelithiasis or cholecystitis. Pancreas: Unremarkable unenhanced appearance. 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 Otherwise unremarkable unenhanced exam. Adrenals/Urinary Tract: No urinary tract calculi or obstructive uropathy within either kidney. Stable right renal cyst does not require specific imaging follow-up. The adrenals are unremarkable. There is persistent irregular bladder wall thickening, which could be due to previous history of bladder repair. Superimposed cystitis would be difficult to exclude, and correlation with urinalysis is recommended. Stomach/Bowel: No bowel obstruction or ileus. Moderate retained stool throughout the colon. No bowel wall thickening or inflammatory change. Vascular/Lymphatic: Aortic atherosclerosis. No enlarged abdominal or pelvic lymph  nodes. Reproductive: Status post hysterectomy. No adnexal masses. Other: No free fluid or free intraperitoneal gas. No abdominal wall hernia. Musculoskeletal: No acute or destructive bony lesions. Chronic compression deformities within the superior endplates of L3 and L4. Reconstructed images demonstrate no additional findings. IMPRESSION: 1. Interval development of innumerable subcentimeter hypodensities throughout the entirety of the liver, superimposed upon chronic background cirrhosis. Differential diagnosis would include multifocal HCC, metastatic disease, atypical presentation of regenerative nodules in cirrhosis, or less likely infectious etiologies to include fungal organisms. Further evaluation with dedicated liver MRI may be useful when the patient is able to clinically tolerate breath hold instructions. 2. Persistent bladder wall thickening. This may be in part due to history of prior bladder repair, though superimposed cystitis cannot be completely excluded. Please correlate with urinalysis. 3. No urinary tract calculi or obstructive uropathy. 4. Moderate fecal retention consistent with constipation. No bowel obstruction or ileus. 5.  Aortic Atherosclerosis (ICD10-I70.0). Electronically Signed   By: Randa Ngo M.D.   On: 12/01/2022 21:12   DG Chest Port 1 View  Result Date: 12/01/2022 CLINICAL DATA:  Questionable sepsis EXAM: PORTABLE CHEST 1 VIEW COMPARISON:  Chest x-ray dated September 09, 2022 FINDINGS: Cardiac and mediastinal contours within normal limits. Electronic device overlying the upper mediastinum. Lungs are clear. No evidence of pleural effusion or pneumothorax IMPRESSION: No active disease. Electronically Signed   By: Yetta Glassman M.D.   On: 12/01/2022 16:41     Data Reviewed: Relevant notes from primary care and specialist visits, past discharge summaries as available in EHR, including Care Everywhere. Prior diagnostic testing as pertinent to current admission  diagnoses Updated medications and problem lists for reconciliation ED course, including vitals, labs, imaging, treatment and response to treatment Triage notes, nursing and pharmacy notes and ED provider's notes Notable results as noted in HPI   Assessment and Plan: * Severe sepsis ([place]) Complicated urinary tract infection Gross hematuria s/p cystoscopy with bladder biopsy 11/07/2022, cytology negative History of Staphylococcus haemolyticus UTI 11/07/2022 Severe sepsis criteria includes tachycardia and lactic acidosis, AKI, hyperbilirubinemia and altered mental status Continue sepsis fluids Will switch antibiotics  to cefepime in view of fairly recent instrumentation on 11/29 Of note patient was prescribed Bactrim on 11/29 but family reports that administration has not been regular. Follow cultures Follow CT abdomen and pelvis "Persistent bladder wall thickening. This may be in part due to history of prior bladder repair, though superimposed cystitis cannot be completely excluded" Consider urology consult   ABLA (acute blood loss anemia) Chronic anticoagulation Gross hematuria Possible coffee-ground emesis Hemoglobin 9.5 down from 11.3 a month prior.  Platelets 139,000. Presumed acute blood loss with some concern for GI etiology given coffee-ground stains around mouth on arrival to the ED Possibility of chronic blood loss from ongoing gross hematuria Serial H&H Type cross and transfuse if necessary GI consult for possible GI related bleeding.Marland KitchenMarland KitchenDiscussed with Dr Vicente Males: Will see in am ..." won't be able to scope until Tuesday 2 days off eloquis unless she has a significant bleed ..." , Hold Eliquis.  SCDs for DVT prophylaxis   AKI (acute kidney injury) ([place]) Acute metabolic acidosis Creatinine 2.0 up from 0.40 a month ago with bicarb of 11 Secondary to sepsis, lactic acidosis IV fluid resuscitation, monitor renal function and avoid nephrotoxins  Acute metabolic  encephalopathy Patient with confusion and lethargy Delirium precautions, fall and aspiration precautions  Hyperbilirubinemia  Elevated bilirubin of 3.5 with AST 58 and ALT 40.  Bilirubin a month ago was 0.7 Secondary to severe sepsis however CT abdomen and pelvis showing concerning findings as outlined below:  "Interval development of innumerable subcentimeter hypodensities.Marland KitchenMarland KitchenDifferential diagnosis would include multifocal HCC, metastatic disease, atypical presentation of regenerative nodules in cirrhosis, or less likely infectious etiologies to include fungal organisms...."  Aggressive treatment of sepsis and continue to monitor Consider MRI as recommended by radiologist  Hypernatremia Hyperchloremia Sodium 149 with chloride 111 Likely secondary to dehydration and poor oral intake related to acute illness Fluid resuscitation LR and monitor.  May consider switching to D5 once stabilized  Thrombocytopenia ([facility]) Stable.  Platelet 1 39,000  Diabetes mellitus, type II ([facility]) Sliding scale insulin coverage.  Hold home metformin in view of AKI  Chronic anticoagulation History of unprovoked PE 02/2022 Hold Eliquis due to concern for acute blood loss SCDs for DVT prophylaxis  Hepatic cirrhosis with portal hypertensive gastropathy and grade 2 esophageal varices seen during EGD 12/2021 ([place]) Possible upper GI bleed (see management of ABLA above) Elevated bilirubin suspect related to sepsis GI has been consulted   Parkinson's disease On amantadine Delirium precautions  Depression Hold meds while n.p.o.        DVT prophylaxis: SCDs  Consults: GI Dr. Vicente Males  Advance Care Planning: DNR   Family Communication: cheryl boggs, daughter and POA  Disposition Plan: Back to previous home environment  Severity of Illness: The appropriate patient status for this patient is INPATIENT. Inpatient status is judged to be reasonable and necessary in order to provide the required intensity of  service to ensure the patient's safety. The patient's presenting symptoms, physical exam findings, and initial radiographic and laboratory data in the context of their chronic comorbidities is felt to place them at high risk for further clinical deterioration. Furthermore, it is not anticipated that the patient will be medically stable for discharge from the hospital within 2 midnights of admission.   * I certify that at the point of admission it is my clinical judgment that the patient will require inpatient hospital care spanning beyond 2 midnights from the point of admission due to high intensity of service, high risk for further deterioration and high frequency  of surveillance required.*  Author: Athena Masse, MD 12/01/2022 8:53 PM  For on call review [email or web address].

## 2022-12-01 NOTE — Progress Notes (Signed)
         CROSS COVER NOTE  NAME: Blaike Newburn MRN: [id number] DOB : 08/03/1941    HPI/Events of Note   INR 4.3  Assessment and  Interventions   Assessment: coagulopathy, likely related to liver disease  Plan: Vitamin K 28m IV, given concern for GI bleed X X

## 2022-12-01 NOTE — ED Provider Notes (Signed)
[facility] Provider Note    Event Date/Time   First MD Initiated Contact with Patient 12/01/22 1551     (approximate)   History   Altered Mental Status   HPI  Whitney [name] is a 81 y.o. female  who presents from living facility today because of concern for confusion, lethargy. Patient herself is unable to give any significant history. Per report the patient is currently being treated for UTI however there is concern that she has not been getting the antibiotics as prescribed.    Physical Exam   Triage Vital Signs: ED Triage Vitals  Enc Vitals Group     BP --      Pulse Rate 12/01/22 1551 (!) 110     Resp 12/01/22 1551 16     Temp 12/01/22 1551 98.2 F (36.8 C)     Temp Source 12/01/22 1551 Oral     SpO2 12/01/22 1551 98 %     Weight 12/01/22 1553 149 lb 14.6 oz (68 kg)     Height 12/01/22 1553 5' 3"  (1.6 m)     Head Circumference --      Peak Flow --      Pain Score --      Pain Loc --      Pain Edu? --      Excl. in [place]? --     Most recent vital signs: Vitals:   12/01/22 1551  Pulse: (!) 110  Resp: 16  Temp: 98.2 F (36.8 C)  SpO2: 98%   General: Awake, alert, not oriented. CV:  Good peripheral perfusion. Tachycardia. Resp:  Normal effort.  Abd:  No distention.  ENT:  Dark coffee ground like substance in oropharynx.    ED Results / Procedures / Treatments   Labs (all labs ordered are listed, but only abnormal results are displayed) Labs Reviewed  LACTIC ACID, PLASMA - Abnormal; Notable for the following components:      Result Value   Lactic Acid, Venous 7.2 (*)    All other components within normal limits  LACTIC ACID, PLASMA - Abnormal; Notable for the following components:   Lactic Acid, Venous 6.0 (*)    All other components within normal limits  COMPREHENSIVE METABOLIC PANEL - Abnormal; Notable for the following components:   Sodium 149 (*)    Chloride 118 (*)    CO2 11 (*)    BUN 69 (*)    Creatinine, Ser 2.01  (*)    Calcium 8.5 (*)    Total Protein 6.4 (*)    Albumin 2.6 (*)    AST 58 (*)    Total Bilirubin 3.5 (*)    GFR, Estimated 24 (*)    Anion gap 20 (*)    All other components within normal limits  CBC WITH DIFFERENTIAL/PLATELET - Abnormal; Notable for the following components:   RBC 3.05 (*)    Hemoglobin 9.5 (*)    HCT 29.9 (*)    RDW 20.0 (*)    Platelets 139 (*)    All other components within normal limits  URINALYSIS, COMPLETE (UACMP) WITH MICROSCOPIC - Abnormal; Notable for the following components:   Color, Urine RED (*)    APPearance TURBID (*)    Glucose, UA   (*)    Value: TEST NOT REPORTED DUE TO COLOR INTERFERENCE OF URINE PIGMENT   Hgb urine dipstick   (*)    Value: TEST NOT REPORTED DUE TO COLOR INTERFERENCE OF URINE PIGMENT   Bilirubin  Urine   (*)    Value: TEST NOT REPORTED DUE TO COLOR INTERFERENCE OF URINE PIGMENT   Ketones, ur   (*)    Value: TEST NOT REPORTED DUE TO COLOR INTERFERENCE OF URINE PIGMENT   Protein, ur   (*)    Value: TEST NOT REPORTED DUE TO COLOR INTERFERENCE OF URINE PIGMENT   Nitrite   (*)    Value: TEST NOT REPORTED DUE TO COLOR INTERFERENCE OF URINE PIGMENT   Leukocytes,Ua   (*)    Value: TEST NOT REPORTED DUE TO COLOR INTERFERENCE OF URINE PIGMENT   RBC / HPF >50 (*)    WBC, UA >50 (*)    Bacteria, UA MANY (*)    Squamous Epithelial / LPF >50 (*)    All other components within normal limits  RESP PANEL BY RT-PCR (RSV, FLU A&B, COVID)  [id number]  CULTURE, BLOOD (ROUTINE X 2)  CULTURE, BLOOD (ROUTINE X 2)  URINE CULTURE  PROTIME-INR  APTT  TYPE AND SCREEN     EKG  I, Nance Pear, attending physician, personally viewed and interpreted this EKG  EKG Time: 1600 Rate: 108 Rhythm: sinus tachycardia Axis: normal Intervals: qtc 541 QRS: narrow, low voltage ST changes: no st elevation Impression: abnormal ekg   RADIOLOGY I independently interpreted and visualized the CXR. My interpretation: No acute abnormality Radiology  interpretation:  IMPRESSION:  No active disease.      PROCEDURES:  Critical Care performed: Yes  Procedures  CRITICAL CARE Performed by: Nance Pear   Total critical care time: 35 minutes  Critical care time was exclusive of separately billable procedures and treating other patients.  Critical care was necessary to treat or prevent imminent or life-threatening deterioration.  Critical care was time spent personally by me on the following activities: development of treatment plan with patient and/or surrogate as well as nursing, discussions with consultants, evaluation of patient's response to treatment, examination of patient, obtaining history from patient or surrogate, ordering and performing treatments and interventions, ordering and review of laboratory studies, ordering and review of radiographic studies, pulse oximetry and re-evaluation of patient's condition.   MEDICATIONS ORDERED IN ED: Medications - No data to display   IMPRESSION / MDM / [place] / ED COURSE  I reviewed the triage vital signs and the nursing notes.                              Differential diagnosis includes, but is not limited to, pneumonia, UTI, anemia, electrolyte abnormality.  Patient's presentation is most consistent with acute presentation with potential threat to life or bodily function.  The patient is on the cardiac monitor to evaluate for evidence of arrhythmia and/or significant heart rate changes.  Patient presented to the emergency department today because of concern for confusion and lethergy. On exam patient is awake and alert, but unable to answer any questions. Afebrile. Blood work without leukocytosis however did show significant lactic acidosis. Additionally blood work shows worsening anemia when compared to blood work from last month. At this time do have concern for possible GI bleed. Patient has history of varices per EGD performed earlier this year. Patient was  given IV abx for possible UTI. Will plan on starting protonix and octreotide as well. Discussed with Dr. Damita Dunnings with the hospitalist service who will plan on admission.     FINAL CLINICAL IMPRESSION(S) / ED DIAGNOSES   Final diagnoses:  Lower urinary tract infectious  disease  Anemia, unspecified type  Gastrointestinal hemorrhage, unspecified gastrointestinal hemorrhage type       Note:  This document was prepared using Dragon voice recognition software and may include unintentional dictation errors.    Nance Pear, MD 12/01/22 2118

## 2022-12-02 DIAGNOSIS — D649 Anemia, unspecified: Secondary | ICD-10-CM | POA: Diagnosis not present

## 2022-12-02 DIAGNOSIS — A419 Sepsis, unspecified organism: Secondary | ICD-10-CM | POA: Diagnosis not present

## 2022-12-02 DIAGNOSIS — R310 Gross hematuria: Secondary | ICD-10-CM | POA: Diagnosis not present

## 2022-12-02 DIAGNOSIS — R6520 Severe sepsis without septic shock: Secondary | ICD-10-CM | POA: Diagnosis not present

## 2022-12-02 LAB — BLOOD CULTURE ID PANEL (REFLEXED) - BCID2

## 2022-12-02 LAB — CBC
HCT: 25.2 % — ABNORMAL LOW (ref 36.0–46.0)
Hemoglobin: 8.1 g/dL — ABNORMAL LOW (ref 12.0–15.0)
MCH: 31.4 pg (ref 26.0–34.0)
MCHC: 32.1 g/dL (ref 30.0–36.0)
MCV: 97.7 fL (ref 80.0–100.0)
Platelets: 100 10*3/uL — ABNORMAL LOW (ref 150–400)
RBC: 2.58 MIL/uL — ABNORMAL LOW (ref 3.87–5.11)
RDW: 19.9 % — ABNORMAL HIGH (ref 11.5–15.5)
WBC: 5.2 10*3/uL (ref 4.0–10.5)
nRBC: 0 % (ref 0.0–0.2)

## 2022-12-02 LAB — COMPREHENSIVE METABOLIC PANEL
ALT: 36 U/L (ref 0–44)
AST: 50 U/L — ABNORMAL HIGH (ref 15–41)
Albumin: 2.1 g/dL — ABNORMAL LOW (ref 3.5–5.0)
Alkaline Phosphatase: 100 U/L (ref 38–126)
Anion gap: 14 (ref 5–15)
BUN: 69 mg/dL — ABNORMAL HIGH (ref 8–23)
CO2: 15 mmol/L — ABNORMAL LOW (ref 22–32)
Calcium: 7.8 mg/dL — ABNORMAL LOW (ref 8.9–10.3)
Chloride: 118 mmol/L — ABNORMAL HIGH (ref 98–111)
Creatinine, Ser: 1.96 mg/dL — ABNORMAL HIGH (ref 0.44–1.00)
GFR, Estimated: 25 mL/min — ABNORMAL LOW (ref >60)
Glucose, Bld: 76 mg/dL (ref 70–99)
Potassium: 4.8 mmol/L (ref 3.5–5.1)
Sodium: 147 mmol/L — ABNORMAL HIGH (ref 135–145)
Total Bilirubin: 3.3 mg/dL — ABNORMAL HIGH (ref 0.3–1.2)
Total Protein: 5.5 g/dL — ABNORMAL LOW (ref 6.5–8.1)

## 2022-12-02 LAB — PROCALCITONIN: Procalcitonin: 0.65 ng/mL

## 2022-12-02 LAB — BLOOD GAS, VENOUS
Acid-base deficit: 8.6 mmol/L — ABNORMAL HIGH (ref 0.0–2.0)
Bicarbonate: 14.2 mmol/L — ABNORMAL LOW (ref 20.0–28.0)
O2 Saturation: 94.3 %
Patient temperature: 37
pCO2, Ven: 23 mmHg — ABNORMAL LOW (ref 44–60)
pH, Ven: 7.4 (ref 7.25–7.43)
pO2, Ven: 73 mmHg — ABNORMAL HIGH (ref 32–45)

## 2022-12-02 LAB — CORTISOL-AM, BLOOD: Cortisol - AM: 36.5 ug/dL — ABNORMAL HIGH (ref 6.7–22.6)

## 2022-12-02 MED ORDER — QUETIAPINE FUMARATE 25 MG PO TABS
50.0000 mg | ORAL_TABLET | Freq: Every day | ORAL | Status: DC
  Filled 2022-12-02: qty 2

## 2022-12-02 MED ORDER — SODIUM CHLORIDE 0.9 % IV SOLN
2.0000 g | INTRAVENOUS | Status: DC
  Administered 2022-12-02 – 2022-12-05 (×4): 2 g via INTRAVENOUS
  Filled 2022-12-02: qty 2
  Filled 2022-12-02 (×3): qty 20

## 2022-12-02 MED ORDER — LORAZEPAM 2 MG/ML IJ SOLN
1.0000 mg | Freq: Four times a day (QID) | INTRAMUSCULAR | Status: DC | PRN
  Administered 2022-12-02 – 2022-12-05 (×4): 1 mg via INTRAVENOUS
  Filled 2022-12-02 (×4): qty 1

## 2022-12-02 NOTE — ED Notes (Signed)
Dr. Billie Ruddy notified that the pt is not waking up to take PO seroquel, pt is altered at this time.

## 2022-12-02 NOTE — ED Notes (Signed)
Per MD Billie Ruddy can discontinue ECG monitoring at this time.

## 2022-12-02 NOTE — ED Notes (Signed)
Patient family complains that patient has been anxious and restless all morning, requesting pain medication for her and also states they have been at bedside all morning and wondering when MD will complete rounds. This RN requested anxiety medication from MD and informed her of family being at bedside if MD knows when she will round. Family also informed patient can have PRN tylenol and that this RN will administer. Family verbalized understanding, MD notified.

## 2022-12-02 NOTE — ED Notes (Signed)
Pt is DNR and has skin tear to R arm from BP cuff last night. Will obtain another BP but at this time, allowing pt to rest. Family at bedside.

## 2022-12-02 NOTE — Progress Notes (Signed)
PHARMACY - PHYSICIAN COMMUNICATION CRITICAL VALUE ALERT - BLOOD CULTURE IDENTIFICATION (BCID)  Whitney [name] is an 81 y.o. female who presented to [facility] on 12/01/2022 with a chief complaint of sepsis.   Assessment:  E coli in 3 of 4 bottles, no resistance detected.  (include suspected source if known)  Name of physician (or Provider) Contacted: Prudy Feeler  Current antibiotics: Cefepime 1 gm IV Q24H   Changes to prescribed antibiotics recommended:  Ceftriaxone 2 gm IV Q24H started , d/c cefepime   Results for orders placed or performed during the hospital encounter of 12/01/22  Blood Culture ID Panel (Reflexed) (Collected: 12/01/2022  4:05 PM)  Result Value Ref Range   Enterococcus faecalis NOT DETECTED NOT DETECTED   Enterococcus Faecium NOT DETECTED NOT DETECTED   Listeria monocytogenes NOT DETECTED NOT DETECTED   Staphylococcus species NOT DETECTED NOT DETECTED   Staphylococcus aureus (BCID) NOT DETECTED NOT DETECTED   Staphylococcus epidermidis NOT DETECTED NOT DETECTED   Staphylococcus lugdunensis NOT DETECTED NOT DETECTED   Streptococcus species NOT DETECTED NOT DETECTED   Streptococcus agalactiae NOT DETECTED NOT DETECTED   Streptococcus pneumoniae NOT DETECTED NOT DETECTED   Streptococcus pyogenes NOT DETECTED NOT DETECTED   A.calcoaceticus-baumannii NOT DETECTED NOT DETECTED   Bacteroides fragilis NOT DETECTED NOT DETECTED   Enterobacterales DETECTED (A) NOT DETECTED   Enterobacter cloacae complex NOT DETECTED NOT DETECTED   Escherichia coli DETECTED (A) NOT DETECTED   Klebsiella aerogenes NOT DETECTED NOT DETECTED   Klebsiella oxytoca NOT DETECTED NOT DETECTED   Klebsiella pneumoniae NOT DETECTED NOT DETECTED   Proteus species NOT DETECTED NOT DETECTED   Salmonella species NOT DETECTED NOT DETECTED   Serratia marcescens NOT DETECTED NOT DETECTED   Haemophilus influenzae NOT DETECTED NOT DETECTED   Neisseria meningitidis NOT DETECTED NOT DETECTED   Pseudomonas  aeruginosa NOT DETECTED NOT DETECTED   Stenotrophomonas maltophilia NOT DETECTED NOT DETECTED   Candida albicans NOT DETECTED NOT DETECTED   Candida auris NOT DETECTED NOT DETECTED   Candida glabrata NOT DETECTED NOT DETECTED   Candida krusei NOT DETECTED NOT DETECTED   Candida parapsilosis NOT DETECTED NOT DETECTED   Candida tropicalis NOT DETECTED NOT DETECTED   Cryptococcus neoformans/gattii NOT DETECTED NOT DETECTED   CTX-M ESBL NOT DETECTED NOT DETECTED   Carbapenem resistance IMP NOT DETECTED NOT DETECTED   Carbapenem resistance KPC NOT DETECTED NOT DETECTED   Carbapenem resistance NDM NOT DETECTED NOT DETECTED   Carbapenem resist OXA 48 LIKE NOT DETECTED NOT DETECTED   Carbapenem resistance VIM NOT DETECTED NOT DETECTED    Jernie Schutt D 12/02/2022  6:08 AM

## 2022-12-02 NOTE — ED Notes (Signed)
Pt currently in bed resting and mumbling and incomprehensible family states normally it can be hard to understand but this is worse than her baseline normally. Family at bedside.

## 2022-12-02 NOTE — ED Notes (Signed)
Called lab to verify compatibility of ceftriaxone with sandostatin. They can be run together.

## 2022-12-02 NOTE — Consult Note (Signed)
Whitney [name] , MD [address] [place], [place], [place], [address] [address] [place], [place], [place], [address] Phone: [phone]  Fax: [phone]  Consultation  Referring Provider:    Dr Francine Graven Primary Care Physician:  Idelle Crouch, MD Primary Gastroenterologist:  Dr. Marius Ditch          Reason for Consultation:     Anemia   Date of Admission:  12/01/2022 Date of Consultation:  12/02/2022         HPI:   Whitney [name] is a 81 y.o. female follows with Dr. Marius Ditch as an outpatient has a history of iron deficiency anemia no overt blood loss, liver cirrhosis recent pulmonary embolism with right heart strain on Eliquis diagnosed in March 2023.  Follows with hematology last office visit was in October 2023.  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  No recent colonoscopy concern for a colon mass at last office visit.  Patient's family wanted to hold off colonoscopy pending cardiac evaluation to make final decision whether to proceed with it.  She was admitted on 12/01/2022 with altered mental status being treated for severe sepsis due to complicated UTI, CT abdomen showed persistent bladder wall thickening, there is concern for ongoing blood loss from hematuria   1 month back hemoglobin 11.3 g with an MCV of 94 on admission was 9.5 g and this morning is 8.1 g and a low platelet count of 100 , Creatinine elevation was 2.01 baseline was around 0.5.  No elevation in BUN/creatinine ratio.  She has metabolic acidosis compensated on arterial blood gas  CT renal stone protocol multiple densities in the liver with differentials of multifocal HCC metastatic disease or infectious diseases like fungal organisms further evaluation MRI suggested, constipation urine analysis shows multiple RBCs over 50 per high-power field.  When I went into the room to see her in the ER she was with her grand daughter and daughter.  Patient herself unable to give any history. Per daughter no history of vomiting blood or blood in her stool, she has had blood in her urine for months and has had recurrent UTI.  Past Medical History:  Diagnosis Date   Anemia    Arthritis    Broken shoulder    left humerus    COVID-19    Depression    Diabetes mellitus without complication ([place])    Goiter    Hyperlipidemia    IDA (iron deficiency anemia) 01/11/2022   Overactive bladder    Parkinson's disease    Pneumonia    Right arm weakness    limited movement   Unsteady gait     Past Surgical History:  Procedure Laterality Date   [place]   post hysterectomy   ESOPHAGOGASTRODUODENOSCOPY N/A 12/27/2021   Procedure: ESOPHAGOGASTRODUODENOSCOPY (EGD);  Surgeon: Toledo, Benay Pike, MD;  Location: [facility] ENDOSCOPY;  Service: Gastroenterology;  Laterality: N/A;   NECK SURGERY     anterior cervical disc fusion   THYROIDECTOMY Left 08/03/2020   Procedure: THYROIDECTOMY-HEMI;  Surgeon: Clyde Canterbury, MD;  Location: [facility] ORS;  Service: ENT;  Laterality: Left;    Prior to Admission medications   Medication Sig Start Date End Date Taking? Authorizing Provider  acetaminophen (TYLENOL) 500 MG tablet Take 500-1,000 mg by mouth every 6 (six) hours as needed (for pain.).   Yes [provider]  Amantadine HCl 100 MG tablet Take 100  mg by mouth 2 (two) times daily. 11/12/22  Yes [provider]  apixaban (ELIQUIS) 5 MG TABS tablet Take 1 tablet (5 mg total) by mouth 2 (two) times daily. 03/11/22  Yes Sharen Hones, MD  buPROPion (WELLBUTRIN XL) 150 MG 24 hr tablet Take 1 tablet by mouth every morning. 02/14/22  Yes [provider]  Carboxymethylcellulose Sodium (REFRESH TEARS OP) Place 1 drop into both eyes 2 (two) times daily.   Yes [provider]  cetirizine (ZYRTEC) 5 MG tablet Take 5 mg by mouth daily.   Yes [provider]  cholecalciferol (VITAMIN D3) 25 MCG  (1000 UNIT) tablet Take 1,000 Units by mouth daily.    Yes [provider]  estradiol (ESTRACE) 0.1 MG/GM vaginal cream Discard applicator Apply pea sized amount to tip of finger to urethra before bed. Wash hands well after application. Use Monday, Wednesday and Friday 08/02/22  Yes Hollice Espy, MD  ferrous sulfate 325 (65 FE) MG tablet Take 325 mg by mouth daily with breakfast.    Yes [provider]  lidocaine (LIDODERM) 5 % Place onto the skin. 07/23/22  Yes [provider]  metFORMIN (GLUCOPHAGE) 1000 MG tablet Take 1,000 mg by mouth 2 (two) times daily. 08/20/22  Yes [provider]  MYRBETRIQ 50 MG TB24 tablet TAKE 1 TABLET(50 MG) BY MOUTH DAILY 07/20/21  Yes Vaillancourt, Samantha, PA-C  pantoprazole (PROTONIX) 40 MG tablet Take 1 tablet by mouth daily. 07/23/22  Yes [provider]  senna-docusate (SENOKOT-S) 8.6-50 MG tablet Take 1 tablet by mouth 2 (two) times daily.   Yes [provider]  Trospium Chloride 60 MG CP24 TAKE 1 CAPSULE(60 MG) BY MOUTH DAILY 09/19/21  Yes Vaillancourt, Samantha, PA-C  vitamin B-12 (CYANOCOBALAMIN) 500 MCG tablet Take 250 mcg by mouth daily.   Yes [provider]  furosemide (LASIX) 40 MG tablet Take 40 mg by mouth daily as needed. Patient not taking: Reported on 12/01/2022 01/12/22   [provider]  nitrofurantoin, macrocrystal-monohydrate, (MACROBID) 100 MG capsule Take 100 mg by mouth 2 (two) times daily. 09/20/22   [provider]  polyethylene glycol (MIRALAX / GLYCOLAX) 17 g packet Take 34 g by mouth daily. Patient not taking: Reported on 12/01/2022 03/06/22   Sharen Hones, MD  triamcinolone cream (KENALOG) 0.1 % Apply topically 2 (two) times daily. Patient not taking: Reported on 12/01/2022 01/19/22   [provider]    Family History  Problem Relation Age of Onset   Stroke Mother    Hypertension Mother    Diabetes Father    Heart disease Father    Lung cancer  Father    Colon cancer Sister    Diabetes Sister    Kidney disease Daughter    Kidney cancer Daughter    Pulmonary embolism Sister    Diabetes Sister    Kidney disease Sister    Heart disease Sister    Atrial fibrillation Sister    Diabetes Brother    Heart disease Brother    AAA (abdominal aortic aneurysm) Brother    Cancer Brother        skin     Social History   Tobacco Use   Smoking status: Former    Packs/day: 2.00    Years: 40.00    Total pack years: 80.00    Types: Cigarettes    Quit date: 12/10/2018    Years since quitting: 3.9   Smokeless tobacco: Never  Vaping Use   Vaping Use: Never used  Substance Use Topics   Alcohol use: Not Currently   Drug use: Never    Allergies as of 12/01/2022   (No Known Allergies)    Review of Systems:    Patient unable to give any history    Physical Exam:  Vital signs in last 24 hours: Temp:  [97.7 F (36.5 C)-98.5 F (36.9 C)] 98.2 F (36.8 C) (12/24 0306) Pulse Rate:  [102-110] 108 (12/24 0830) Resp:  [16-43] 25 (12/24 0830) BP: (97-124)/(53-74) 111/59 (12/24 0500) SpO2:  [96 %-100 %] 99 % (12/24 0830) Weight:  [68 kg] 68 kg (12/23 1553)   General:   not communicatiny but awake  Head:  Normocephalic and atraumatic. Eyes:   No icterus.   Conjunctiva pink. PERRLA. Ears:  Normal auditory acuity. Neck:  Supple; no masses or thyroidomegaly Lungs: Respirations even and unlabored. Lungs clear to auscultation bilaterally.   No wheezes, crackles, or rhonchi.  Heart:  Regular rate and rhythm;  Without murmur, clicks, rubs or gallops Abdomen:  Soft, nondistended, nontender. Normal bowel sounds. No appreciable masses or hepatomegaly.  No rebound or guarding.  Neurologic:  Alert and oriented x0  Skin:  Intact without significant lesions or rashes. Psych:  awake but not communicating   LAB RESULTS: Recent Labs    12/01/22 1605 12/01/22 2138 12/02/22 0259  WBC 7.2  --  5.2  HGB 9.5* 8.3* 8.1*  HCT 29.9*  --  25.2*   PLT 139*  --  100*   BMET Recent Labs    12/01/22 1605 12/02/22 0259  NA 149* 147*  K 4.6 4.8  CL 118* 118*  CO2 11* 15*  GLUCOSE 71 76  BUN 69* 69*  CREATININE 2.01* 1.96*  CALCIUM 8.5* 7.8*   LFT Recent Labs    12/02/22 0259  PROT 5.5*  ALBUMIN 2.1*  AST 50*  ALT 36  ALKPHOS 100  BILITOT 3.3*   PT/INR Recent Labs    12/01/22 2002  LABPROT 41.0*  INR 4.3*    STUDIES: CT RENAL STONE STUDY  Result Date: 12/01/2022 CLINICAL DATA:  Hematuria, urinary tract infection EXAM: CT ABDOMEN AND PELVIS WITHOUT CONTRAST TECHNIQUE: Multidetector CT imaging of the abdomen and pelvis was performed following the standard protocol without IV contrast. Unenhanced CT was performed per clinician order. Lack of IV contrast limits sensitivity and specificity, especially for evaluation of abdominal/pelvic solid viscera. RADIATION DOSE REDUCTION: This exam was performed according to the departmental dose-optimization program which includes automated exposure control, adjustment of the mA and/or kV according to patient size and/or use of iterative reconstruction technique. COMPARISON:  07/27/2022 FINDINGS: Lower chest: No acute pleural or parenchymal lung disease. Hepatobiliary: Since the previous exam, innumerable subcentimeter hypodensities are seen throughout the liver parenchyma. These are superimposed upon background cirrhosis. No intrahepatic biliary duct dilation. The gallbladder is moderately distended, with gallbladder sludge layering dependently. No evidence of cholelithiasis or cholecystitis. Pancreas: Unremarkable unenhanced appearance. 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 Otherwise unremarkable unenhanced exam. Adrenals/Urinary Tract: No urinary tract calculi or obstructive uropathy within either kidney. Stable right renal cyst does not require specific imaging follow-up. The adrenals are unremarkable. There is persistent irregular bladder wall  thickening, which could be due to previous history of bladder repair. Superimposed cystitis would be difficult to exclude, and correlation with urinalysis is recommended. Stomach/Bowel: No bowel obstruction or ileus. Moderate retained stool throughout the colon. No bowel wall thickening or inflammatory change. Vascular/Lymphatic: Aortic atherosclerosis. No enlarged abdominal or pelvic lymph nodes. Reproductive: Status post  hysterectomy. No adnexal masses. Other: No free fluid or free intraperitoneal gas. No abdominal wall hernia. Musculoskeletal: No acute or destructive bony lesions. Chronic compression deformities within the superior endplates of L3 and L4. Reconstructed images demonstrate no additional findings. IMPRESSION: 1. Interval development of innumerable subcentimeter hypodensities throughout the entirety of the liver, superimposed upon chronic background cirrhosis. Differential diagnosis would include multifocal HCC, metastatic disease, atypical presentation of regenerative nodules in cirrhosis, or less likely infectious etiologies to include fungal organisms. Further evaluation with dedicated liver MRI may be useful when the patient is able to clinically tolerate breath hold instructions. 2. Persistent bladder wall thickening. This may be in part due to history of prior bladder repair, though superimposed cystitis cannot be completely excluded. Please correlate with urinalysis. 3. No urinary tract calculi or obstructive uropathy. 4. Moderate fecal retention consistent with constipation. No bowel obstruction or ileus. 5.  Aortic Atherosclerosis (ICD10-I70.0). Electronically Signed   By: Randa Ngo M.D.   On: 12/01/2022 21:12   DG Chest Port 1 View  Result Date: 12/01/2022 CLINICAL DATA:  Questionable sepsis EXAM: PORTABLE CHEST 1 VIEW COMPARISON:  Chest x-ray dated September 09, 2022 FINDINGS: Cardiac and mediastinal contours within normal limits. Electronic device overlying the upper mediastinum.  Lungs are clear. No evidence of pleural effusion or pneumothorax IMPRESSION: No active disease. Electronically Signed   By: Yetta Glassman M.D.   On: 12/01/2022 16:41      Impression / Plan:   Whitney [name] is a 81 y.o. y/o female with a history of cirrhosis follows with Dr. Marius Ditch, iron deficiency anemia, grade 2 varices portal hypertensive gastropathy admitted with severe sepsis secondary to UTI, there is blood seen in the urine, drop in hemoglobin from baseline of over 2 g, no gross overt blood loss witnessed since admission.  CT scan of renal cell protocol concerning for lesions in the liver which could be metastatic disease or HCC.Per daughter no rectal bleeding but has has blood in her urine for months .   Plan 1.  Monitor the color of the stool if having black-colored stools would indicate a GI bleed then need to perform endoscopy if not having black-colored stools look for other causes of blood loss such as hematuria while on blood thinners Eliquis for PE.The daughter clearly states that she has had lots of blood in her urine for months.   2.  When medically stable consider MRI of the liver to evaluate lesions further  3.  Any endoscopy evaluation if warranted she should be off Eliquis for at least 2 days, hemodynamically stable, presently has also has AKI.  4.  IV PPI monitor CBC and transfuse as needed   I will sign off.  Please call me if any further GI concerns or questions.  We would like to thank you for the opportunity to participate in the care of Whitney [name].   Thank you for involving me in the care of this patient.      LOS: 1 day   Whitney Bellows, MD  12/02/2022, 12:48 PM

## 2022-12-02 NOTE — Progress Notes (Signed)
PROGRESS NOTE    Whitney [name]  KHT:[id number] DOB: June 18, 1941 DOA: 12/01/2022 PCP: Idelle Crouch, MD  [id number]  LOS: 1 day   Brief hospital course:   Assessment & Plan: Whitney [name] is a 81 y.o. 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  According to reports given by EMS, patient has not been getting antibiotics as prescribed for unclear reason.    * Severe sepsis ([place]) Complicated urinary tract infection E coli bacteremia --Gross hematuria s/p cystoscopy 11/07/2022, with urine cytology negative.  Daughter reported no biopsy done. --History of Staphylococcus haemolyticus UTI 11/07/2022 --Severe sepsis criteria includes tachycardia and lactic acidosis, AKI, hyperbilirubinemia and altered mental status --s/p sepsis fluids Plan: --cont ceftriaxone  ABLA (acute blood loss anemia) 2/2 Gross hematuria Possible coffee-ground emesis  In the setting of Chronic anticoagulation and elevated INR Hemoglobin 9.5 on presentation, down from 11.3 a month prior.   --INR 4.3, s/p IV vit K --GI consulted Plan: --hold Eliquis --monitor Hgb and transfuse to keep Hgb >7  AKI (acute kidney injury) ([place]) Acute metabolic acidosis Creatinine 2.0 up from 0.40 a month ago with bicarb of 11 --cont MIVF@75   Acute metabolic encephalopathy Patient with confusion and lethargy Delirium precautions, fall and  aspiration precautions  Hyperbilirubinemia Liver lesions  Elevated bilirubin of 3.5 with AST 58 and ALT 40.  Bilirubin a month ago was 0.7 Secondary to severe sepsis however CT abdomen and pelvis showing concerning findings as outlined below:  "Interval development of innumerable subcentimeter hypodensities.Marland KitchenMarland KitchenDifferential diagnosis would include multifocal HCC, metastatic disease, atypical presentation of regenerative nodules in cirrhosis, or less likely infectious etiologies to include fungal organisms...."  Aggressive treatment of sepsis and continue to monitor Consider MRI as recommended by radiologist  Hypernatremia Hyperchloremia Sodium 149 with chloride 111 Likely secondary to dehydration and poor oral intake related to acute illness --cont MIVF@75   Thrombocytopenia (HCC) Stable.    Diabetes mellitus, type II ([place]) --A1c 6.2, well controlled --no need for BG checks  Chronic anticoagulation History of unprovoked PE 02/2022 Hold Eliquis due to concern for acute blood loss SCDs for DVT prophylaxis  Hepatic cirrhosis with portal hypertensive gastropathy and grade 2 esophageal varices seen during EGD 12/2021 [facility]) New liver lesions --GI consulted  Parkinson's disease On amantadine Delirium precautions  Depression Hold meds while n.p.o.   DVT prophylaxis: SCD/Compression stockings Code Status: DNR no intubation Family Communication: daughter updated at bedside today Level of care: Progressive Dispo:   The patient is from: home Anticipated d/c is to: undetermined Anticipated d/c date is: undetermined   Subjective and Interval History:  Pt was somnolent, not awake or oriented to answer questions.  Talked to daughter at bedside.  Per RN, intermittently agitated.   Objective: Vitals:   12/02/22 0739 12/02/22 0830 12/02/22 1256 12/02/22 1600  BP:   (!) 108/56 (!) 110/57  Pulse: (!) 110 (!) 108 (!) 101 (!) 111  Resp: 20 (!) 25 (!) 26 (!) 23  Temp:   98.6 F (37  C)   TempSrc:   Oral  SpO2: 99% 99% 98% 98%  Weight:      Height:        Intake/Output Summary (Last 24 hours) at 12/02/2022 1650 Last data filed at 12/02/2022 1108 Gross per 24 hour  Intake 100 ml  Output --  Net 100 ml   Filed Weights   12/01/22 1553  Weight: 68 kg    Examination:   Constitutional: NAD, somnolent HEENT: coffee ground material in and around mouth CV: No cyanosis.   RESP: normal respiratory effort, on RA SKIN: warm, dry   Data Reviewed: I have personally reviewed labs and imaging studies  Time spent: 50 minutes  Enzo Bi, MD [facility] If 7PM-7AM, please contact night-coverage 12/02/2022, 4:50 PM

## 2022-12-03 DIAGNOSIS — R6520 Severe sepsis without septic shock: Secondary | ICD-10-CM | POA: Diagnosis not present

## 2022-12-03 DIAGNOSIS — A419 Sepsis, unspecified organism: Secondary | ICD-10-CM | POA: Diagnosis not present

## 2022-12-03 LAB — BASIC METABOLIC PANEL
Anion gap: 12 (ref 5–15)
BUN: 60 mg/dL — ABNORMAL HIGH (ref 8–23)
CO2: 17 mmol/L — ABNORMAL LOW (ref 22–32)
Calcium: 7.9 mg/dL — ABNORMAL LOW (ref 8.9–10.3)
Chloride: 125 mmol/L — ABNORMAL HIGH (ref 98–111)
Creatinine, Ser: 1.62 mg/dL — ABNORMAL HIGH (ref 0.44–1.00)
GFR, Estimated: 32 mL/min — ABNORMAL LOW (ref >60)
Glucose, Bld: 82 mg/dL (ref 70–99)
Potassium: 4.3 mmol/L (ref 3.5–5.1)
Sodium: 154 mmol/L — ABNORMAL HIGH (ref 135–145)

## 2022-12-03 LAB — CBC
HCT: 27 % — ABNORMAL LOW (ref 36.0–46.0)
Hemoglobin: 8.7 g/dL — ABNORMAL LOW (ref 12.0–15.0)
MCH: 31.5 pg (ref 26.0–34.0)
MCHC: 32.2 g/dL (ref 30.0–36.0)
MCV: 97.8 fL (ref 80.0–100.0)
Platelets: 90 10*3/uL — ABNORMAL LOW (ref 150–400)
RBC: 2.76 MIL/uL — ABNORMAL LOW (ref 3.87–5.11)
RDW: 20.4 % — ABNORMAL HIGH (ref 11.5–15.5)
WBC: 6.7 10*3/uL (ref 4.0–10.5)
nRBC: 0 % (ref 0.0–0.2)

## 2022-12-03 LAB — MAGNESIUM: Magnesium: 1.8 mg/dL (ref 1.7–2.4)

## 2022-12-03 MED ORDER — SODIUM BICARBONATE 8.4 % IV SOLN
INTRAVENOUS | Status: DC
  Filled 2022-12-03: qty 1000
  Filled 2022-12-03 (×3): qty 150

## 2022-12-03 MED ORDER — DEXTROSE 5 % IV SOLN: INTRAVENOUS | Status: DC

## 2022-12-03 MED ORDER — ORAL CARE MOUTH RINSE
15.0000 mL | OROMUCOSAL | Status: DC
  Administered 2022-12-03 – 2022-12-06 (×10): 15 mL via OROMUCOSAL

## 2022-12-03 NOTE — Progress Notes (Signed)
PROGRESS NOTE    Whitney [name]  [id number]:[id number] DOB: May 07, 1941 DOA: 12/01/2022 PCP: Idelle Crouch, MD  260A/260A-AA  LOS: 2 days   Brief hospital course:   Assessment & Plan: Whitney [name] is a 81 y.o. 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  According to reports given by EMS, patient has not been getting antibiotics as prescribed for unclear reason.    * Severe sepsis ([place]) Complicated urinary tract infection E coli bacteremia --Gross hematuria s/p cystoscopy 11/07/2022, with urine cytology negative.  Daughter reported no biopsy done. --History of Staphylococcus haemolyticus UTI 11/07/2022 --Severe sepsis criteria includes tachycardia and lactic acidosis, AKI, hyperbilirubinemia and altered mental status --s/p sepsis fluids Plan: --cont ceftriaxone  ABLA (acute blood loss anemia) 2/2 Gross hematuria Possible coffee-ground emesis  In the setting of Chronic anticoagulation and elevated INR Hemoglobin 9.5 on presentation, down from 11.3 a month prior.   --INR 4.3, s/p IV vit K --GI consulted, signed off Plan: --hold Eliquis --monitor Hgb and transfuse to keep Hgb >7 --d/c octreotide gtt --cont IV PPI  AKI (acute kidney injury) ([place]) Acute metabolic acidosis Creatinine 2.0 up from 0.40 a month ago with bicarb of 11 --cont MIVF as NaBicarb@75   Acute metabolic encephalopathy Patient  with confusion and lethargy Delirium precautions, fall and aspiration precautions  Hyperbilirubinemia Liver lesions  Elevated bilirubin of 3.5 with AST 58 and ALT 40.  Bilirubin a month ago was 0.7 Secondary to severe sepsis however CT abdomen and pelvis showing concerning findings as outlined below:  "Interval development of innumerable subcentimeter hypodensities.Marland KitchenMarland KitchenDifferential diagnosis would include multifocal HCC, metastatic disease, atypical presentation of regenerative nodules in cirrhosis, or less likely infectious etiologies to include fungal organisms...." --Consider MRI as recommended by radiologist  Hypernatremia, worsening Hyperchloremia Sodium 149 with chloride 111 Likely secondary to dehydration and poor oral intake related to acute illness --cont MIVF as NaBicarb@75   Thrombocytopenia (HCC) Stable.    Diabetes mellitus, type II ([place]) --A1c 6.2, well controlled --no need for BG checks  Chronic anticoagulation History of unprovoked PE 02/2022 Hold Eliquis due to concern for acute blood loss  Hepatic cirrhosis with portal hypertensive gastropathy and grade 2 esophageal varices seen during EGD 12/2021 [facility]) New liver lesions --GI consulted --Consider MRI as recommended by radiologist  Parkinson's disease On amantadine Delirium precautions  Depression Hold meds while n.p.o.   DVT prophylaxis: SCD/Compression stockings Code Status: DNR no intubation Family Communication:  Level of care: Progressive Dispo:   The patient is from: home Anticipated d/c is to: undetermined Anticipated d/c date is: undetermined   Subjective and Interval History:  Pt was again somnolent during rounds, did not wake up to answer questions.   Objective: Vitals:   12/03/22 0426 12/03/22 0755 12/03/22 0800 12/03/22 1239  BP: (!) 112/58 97/83  121/63  Pulse: (!) 106 (!) 112 (!) 105 (!) 105  Resp: 20 16  16   Temp: 98.1 F (36.7 C) 98.6 F (37 C)  97.9 F (36.6 C)  TempSrc:  Oral Oral    SpO2: 96%  93%  96%  Weight:      Height:        Intake/Output Summary (Last 24 hours) at 12/03/2022 1800 Last data filed at 12/03/2022 0200 Gross per 24 hour  Intake 0 ml  Output 1 ml  Net -1 ml   Filed Weights   12/01/22 1553 12/03/22 0200  Weight: 68 kg 56.8 kg    Examination:   Constitutional: NAD, somonolent HEENT: oral mucosa dry with crusts and coffee grounds  CV: No cyanosis.   RESP: normal respiratory effort, on RA SKIN: warm, dry   Data Reviewed: I have personally reviewed labs and imaging studies  Time spent: 35 minutes  Enzo Bi, MD [facility] If 7PM-7AM, please contact night-coverage 12/03/2022, 6:00 PM

## 2022-12-03 NOTE — Plan of Care (Signed)
  Problem: Education: Goal: Knowledge of General Education information will improve Description: Including pain rating scale, medication(s)/side effects and non-pharmacologic comfort measures Outcome: Not Met (add Reason) Note: lethargic   Problem: Safety: Goal: Ability to remain free from injury will improve Outcome: Completed/Met   Problem: Safety: Goal: Ability to remain free from injury will improve Outcome: Completed/Met

## 2022-12-04 DIAGNOSIS — R6520 Severe sepsis without septic shock: Secondary | ICD-10-CM | POA: Diagnosis not present

## 2022-12-04 DIAGNOSIS — A419 Sepsis, unspecified organism: Secondary | ICD-10-CM | POA: Diagnosis not present

## 2022-12-04 LAB — CBC
HCT: 26.4 % — ABNORMAL LOW (ref 36.0–46.0)
Hemoglobin: 8.6 g/dL — ABNORMAL LOW (ref 12.0–15.0)
MCH: 31.6 pg (ref 26.0–34.0)
MCHC: 32.6 g/dL (ref 30.0–36.0)
MCV: 97.1 fL (ref 80.0–100.0)
Platelets: 76 10*3/uL — ABNORMAL LOW (ref 150–400)
RBC: 2.72 MIL/uL — ABNORMAL LOW (ref 3.87–5.11)
RDW: 21.2 % — ABNORMAL HIGH (ref 11.5–15.5)
WBC: 6.7 10*3/uL (ref 4.0–10.5)
nRBC: 0 % (ref 0.0–0.2)

## 2022-12-04 LAB — BASIC METABOLIC PANEL
Anion gap: 11 (ref 5–15)
BUN: 47 mg/dL — ABNORMAL HIGH (ref 8–23)
CO2: 23 mmol/L (ref 22–32)
Calcium: 7.9 mg/dL — ABNORMAL LOW (ref 8.9–10.3)
Chloride: 122 mmol/L — ABNORMAL HIGH (ref 98–111)
Creatinine, Ser: 1.27 mg/dL — ABNORMAL HIGH (ref 0.44–1.00)
GFR, Estimated: 42 mL/min — ABNORMAL LOW (ref >60)
Glucose, Bld: 160 mg/dL — ABNORMAL HIGH (ref 70–99)
Potassium: 3.6 mmol/L (ref 3.5–5.1)
Sodium: 156 mmol/L — ABNORMAL HIGH (ref 135–145)

## 2022-12-04 LAB — CULTURE, BLOOD (ROUTINE X 2): Special Requests: ADEQUATE

## 2022-12-04 LAB — URINE CULTURE: Culture: >=100000 — AB

## 2022-12-04 LAB — MAGNESIUM: Magnesium: 1.9 mg/dL (ref 1.7–2.4)

## 2022-12-04 NOTE — Progress Notes (Addendum)
PROGRESS NOTE    Whitney [name]  YSA:[id number] DOB: 06/01/1941 DOA: 12/01/2022 PCP: Idelle Crouch, MD  260A/260A-AA  LOS: 3 days   Brief hospital course:   Assessment & Plan: Whitney [name] is a 81 y.o. 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     * Severe sepsis ([place]) Complicated urinary tract infection E coli bacteremia --Gross hematuria s/p cystoscopy 11/07/2022, with urine cytology negative.  Daughter reported no biopsy done. --History of Staphylococcus haemolyticus UTI 11/07/2022 --Severe sepsis criteria includes tachycardia and lactic acidosis, AKI, hyperbilirubinemia and altered mental status --s/p sepsis fluids Plan: --cont ceftriaxone  ABLA (acute blood loss anemia) 2/2 Gross hematuria Possible coffee-ground emesis  In the setting of Chronic anticoagulation and elevated INR Hemoglobin 9.5 on presentation, down from 11.3 a month prior.   --INR 4.3, s/p IV vit K --GI consulted, signed off Plan: --hold Eliquis --monitor Hgb and transfuse to keep Hgb >7 --cont IV PPI  AKI (acute kidney injury) ([place]) Acute metabolic acidosis Creatinine 2.0 up from 0.40 a month ago with elevated BUN.  Likely dehydration.  bicarb of 11.   Plan: --cont MIVF as Na Bicarb@75   Hypernatremia --Na 149 on presentation and worsening, likely due to free water deficit from no oral hydration. --cont MIVF as Na Bicarb@75   Acute metabolic encephalopathy Patient with confusion and lethargy Delirium precautions, fall and  aspiration precautions  Hyperbilirubinemia Liver lesions  Elevated bilirubin of 3.5 with AST 58 and ALT 40.  Bilirubin a month ago was 0.7 Secondary to severe sepsis however CT abdomen and pelvis showing concerning findings as outlined below:  "Interval development of innumerable subcentimeter hypodensities.Marland KitchenMarland KitchenDifferential diagnosis would include multifocal HCC, metastatic disease, atypical presentation of regenerative nodules in cirrhosis, or less likely infectious etiologies to include fungal organisms...." --Consider MRI as recommended by radiologist  Thrombocytopenia ([place]) Stable.    Diabetes mellitus, type II ([place]) --A1c 6.2, well controlled --no need for BG checks  Chronic anticoagulation History of unprovoked PE 02/2022 Hold Eliquis due to concern for acute blood loss  Hepatic cirrhosis with portal hypertensive gastropathy and grade 2 esophageal varices seen during EGD 12/2021 [facility]) New liver lesions --GI consulted --Consider MRI as recommended by radiologist  Parkinson's disease On amantadine Delirium precautions  Depression Hold meds while n.p.o.   DVT prophylaxis: SCD/Compression stockings Code Status: DNR no intubation, no feeding tube.   Discussed goals of care with daughter today 12/26.  Will treat medically for a few more days, and if pt's mental status doesn't improve and isn't alert enough to have oral intake, will consider comfort care and hospice facility discharge.  No need for palliative consult.  Hospice liaison contacted.    Family Communication:  Level of care: Progressive Dispo:   The patient is from: home Anticipated d/c is to: undetermined Anticipated d/c date is: undetermined   Subjective and Interval History:  Pt had been sleeping most of the time.  Occasionally woke up and then would be agitated.     Objective: Vitals:   12/04/22 0337 12/04/22 0815 12/04/22 1316 12/04/22 1704  BP: 110/63 111/60 111/61 116/65  Pulse: (!) 105 (!) 104 Marland Kitchen)  106 96  Resp: 16 (!) 21 (!) 24 (!) 24  Temp: 99.1 F (37.3 C) (!) 97.5 F (36.4 C) 99.1 F (37.3 C) 99.5 F (37.5 C)  TempSrc: Oral Oral Oral Oral  SpO2: 97% 93% 95% 97%  Weight:      Height:        Intake/Output Summary (Last 24 hours) at 12/04/2022 2004 Last data filed at 12/04/2022 1558 Gross per 24 hour  Intake 2678.97 ml  Output 500 ml  Net 2178.97 ml   Filed Weights   12/01/22 1553 12/03/22 0200  Weight: 68 kg 56.8 kg    Examination:   Constitutional: NAD, sleeping HEENT: dry oral mucosa CV: No cyanosis.   RESP: normal respiratory effort, on RA SKIN: warm, dry   Data Reviewed: I have personally reviewed labs and imaging studies  Time spent: 50 minutes  Enzo Bi, MD [facility] If 7PM-7AM, please contact night-coverage 12/04/2022, 8:04 PM

## 2022-12-04 NOTE — Plan of Care (Signed)
  Problem: Fluid Volume: Goal: Hemodynamic stability will improve Outcome: Progressing

## 2022-12-04 NOTE — Progress Notes (Signed)
   12/04/22 0337  Assess: MEWS Score  Temp 99.1 F (37.3 C)  BP 110/63  MAP (mmHg) 76  Pulse Rate (!) 105  Resp 16  SpO2 97 %  O2 Device Room Air  Assess: MEWS Score  MEWS Temp 0  MEWS Systolic 0  MEWS Pulse 1  MEWS RR 0  MEWS LOC 1  MEWS Score 2  MEWS Score Color Yellow  Assess: if the MEWS score is Yellow or Red  Were vital signs taken at a resting state? Yes  Focused Assessment No change from prior assessment  Does the patient meet 2 or more of the SIRS criteria? No  MEWS guidelines implemented *See Row Information* No, altered LOC is baseline  Treat  Pain Scale PAINAD  Breathing 0  Negative Vocalization 1  Facial Expression 0  Body Language 0  Consolability 0  PAINAD Score 1  Escalate  MEWS: Escalate  (No previously yellow)  Notify: Charge Nurse/RN  Name of Charge Nurse/RN Notified Alex, RN  Date Charge Nurse/RN Notified 12/04/22  Time Charge Nurse/RN Notified 0404  Document  Patient Outcome Other (Comment) (Patient is altered mental status)  Assess: SIRS CRITERIA  SIRS Temperature  0  SIRS Pulse 1  SIRS Respirations  0  SIRS WBC 0  SIRS Score Sum  1

## 2022-12-04 NOTE — Progress Notes (Signed)
[facility]) Hospital Liaison Note   Received request from daughter, [name], to contact her to explain services. Lengthy discussion providing information for the Hospice Home. [name] voiced understanding.  Family requested additional time to see how patient responds to abx. MSW voiced understanding. All questions answered and no concerns voiced.  At this time, patient is not under review for [facility] services as family continues to have ongoing [place] discussions.   [facility] information and contact numbers given to family & above information shared with TOC.   Please call with any questions/concerns.    Thank you for the opportunity to participate in this patient's care.   Daphene Calamity, MSW [facility] Liaison  [phone]

## 2022-12-05 DIAGNOSIS — A419 Sepsis, unspecified organism: Secondary | ICD-10-CM | POA: Diagnosis not present

## 2022-12-05 DIAGNOSIS — R6520 Severe sepsis without septic shock: Secondary | ICD-10-CM | POA: Diagnosis not present

## 2022-12-05 DIAGNOSIS — I493 Ventricular premature depolarization: Secondary | ICD-10-CM

## 2022-12-05 LAB — PHOSPHORUS: Phosphorus: 1.6 mg/dL — ABNORMAL LOW (ref 2.5–4.6)

## 2022-12-05 LAB — BASIC METABOLIC PANEL
Anion gap: 6 (ref 5–15)
BUN: 36 mg/dL — ABNORMAL HIGH (ref 8–23)
CO2: 30 mmol/L (ref 22–32)
Calcium: 7.5 mg/dL — ABNORMAL LOW (ref 8.9–10.3)
Chloride: 122 mmol/L — ABNORMAL HIGH (ref 98–111)
Creatinine, Ser: 0.91 mg/dL (ref 0.44–1.00)
GFR, Estimated: >60 mL/min (ref >60)
Glucose, Bld: 186 mg/dL — ABNORMAL HIGH (ref 70–99)
Potassium: 3.4 mmol/L — ABNORMAL LOW (ref 3.5–5.1)
Sodium: 158 mmol/L — ABNORMAL HIGH (ref 135–145)

## 2022-12-05 LAB — CBC
HCT: 27.6 % — ABNORMAL LOW (ref 36.0–46.0)
Hemoglobin: 8.8 g/dL — ABNORMAL LOW (ref 12.0–15.0)
MCH: 31.9 pg (ref 26.0–34.0)
MCHC: 31.9 g/dL (ref 30.0–36.0)
MCV: 100 fL (ref 80.0–100.0)
Platelets: 51 10*3/uL — ABNORMAL LOW (ref 150–400)
RBC: 2.76 MIL/uL — ABNORMAL LOW (ref 3.87–5.11)
RDW: 21.5 % — ABNORMAL HIGH (ref 11.5–15.5)
WBC: 5.4 10*3/uL (ref 4.0–10.5)
nRBC: 0 % (ref 0.0–0.2)

## 2022-12-05 LAB — SODIUM
Sodium: 158 mmol/L — ABNORMAL HIGH (ref 135–145)
Sodium: 156 mmol/L — ABNORMAL HIGH (ref 135–145)
Sodium: 159 mmol/L — ABNORMAL HIGH (ref 135–145)

## 2022-12-05 LAB — MAGNESIUM: Magnesium: 1.8 mg/dL (ref 1.7–2.4)

## 2022-12-05 MED ORDER — DEXTROSE 5 % IV SOLN: INTRAVENOUS | Status: DC

## 2022-12-05 MED ORDER — MORPHINE 100MG IN NS 100ML (1MG/ML) PREMIX INFUSION
1.0000 mg/h | INTRAVENOUS | Status: DC
  Administered 2022-12-05: 1 mg/h via INTRAVENOUS
  Filled 2022-12-05: qty 100

## 2022-12-05 MED ORDER — CEFAZOLIN SODIUM-DEXTROSE 2-4 GM/100ML-% IV SOLN
2.0000 g | Freq: Three times a day (TID) | INTRAVENOUS | Status: DC
  Filled 2022-12-05: qty 100

## 2022-12-05 MED ORDER — POTASSIUM CL IN DEXTROSE 5% 20 MEQ/L IV SOLN
20.0000 meq | INTRAVENOUS | Status: DC
  Administered 2022-12-05: 20 meq via INTRAVENOUS
  Filled 2022-12-05: qty 1000

## 2022-12-05 NOTE — Progress Notes (Addendum)
Progress Note   Patient: Whitney [name] YKZ:[id number] DOB: [birth year] DOA: 12/01/2022     4 DOS: the patient was seen and examined on 12/05/2022   Brief hospital course: Zamoria Boss is a 81 y.o. 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  She was admitted and treated for severe sepsis.  Clinical course complicated by altered mental status secondary to worsening hypernatremia.  Family have opted to pursue comfort measures as they did not wish to put patient through any heroic measures at this time.  Assessment and Plan:  Comfort measures only: Family do not wish to pursue any heroic measures at this time.  Severe sepsis ([place]): secondary to complicated urinary tract infection E coli bacteremia isolated.  She presented with Gross hematuria s/p cystoscopy 11/07/2022, with urine cytology negative.History of Staphylococcus haemolyticus UTI 11/07/2022. Severe sepsis criteria includes tachycardia and lactic acidosis, AKI, hyperbilirubinemia and altered mental status.  Altered mental status still persists most likely secondary to hypernatremia.  Family however has decided against any further heroic measures.  They wish for patient to be comfort measures.  Palliative care has been involved with complex decision making.  Antibiotics and fluids will be discontinued per family's wishes.    ABLA (acute blood loss anemia) 2/2 Gross hematuria Hemoglobin 9.5>>8.8.  No further intervention at this time. INR 4.3, s/p IV vit K.  Eliquis was  discontinued. GI consulted, signed off.  PPI was ordered.    AKI (acute kidney injury) (HCC)Serum creatinine back at baseline.  Acute kidney injury likely prerenal etiology secondary to dehydration.    Acute metabolic acidosis: Resolved with bicarb replacement treatment.   Hypernatremia Na 149 on presentation and worsening, likely due to free water deficit from no oral hydration.  IV fluids were changed to D5 water.  Family however wishes for all heroic measures to seize due to comfort measures only.  Will DC IV fluids.  Acute metabolic encephalopathy secondary to hypernatremia Patient with confusion and lethargy.Delirium precautions, fall and aspiration precautions   Hepatic cirrhosis with portal hypertensive gastropathy and grade 2 esophageal /Hyperbilirubinemia. Liver lesions  Elevated bilirubin of 3.5 with elevated AST 58.most likely secondary to severe sepsis however CT abdomen and pelvis showing concerning findings as outlined below: Interval development of subcentimeter hypodensities with concern for hepatocellular carcinoma/metastatic disease.  Based on the constellation of comorbidities, family has opted not to pursue any further interventions and pursue comfort measures only protocol.    Thrombocytopenia (HCC)    Diabetes mellitus, type II (HCC) A1c 6.2, well controlled.No need for BG checks   Chronic anticoagulation History of unprovoked PE 02/2022 Hold Eliquis -no further heroic measures.  Parkinson's disease Hold all medications at this time  Depression Hold meds while n.p.o.   Protein energy malnutrition  Subjective:  Unable to obtain appropriate history from patient.  Patient's family at bedside, discussed plan of care and per their mother's wishes, recommended patient be placed on comfort care.  Palliative care team has been involved with transition of care to comfort.  No further heroic measures as per family.  Physical Exam: Vitals:   12/05/22 0143 12/05/22 0442  12/05/22 0855 12/05/22  1242  BP: (!) 99/56 104/61 (!) 106/56 (!) 91/55  Pulse: 92 86 91 91  Resp: 16 (!) 22 20 20   Temp: 98.8 F (37.1 C) 98.5 F (36.9 C) 97.8 F (36.6 C) 98.3 F (36.8 C)  TempSrc:  Oral  Oral  SpO2: 97% 100% 98% 99%  Weight:      Height:        Data Reviewed: {Serum sodium is 158, potassium 3.4, chloride 122, glucose 186, BUN 36, creatinine 0.91.  WBC 5.4, hemoglobin 8.8, hematocrit 27.6, platelet count 51.    Family Communication: Discuss plan of care in detail with patient's daughter Whitney [name] and son Whitney [name].  Their wishes per their mothers.  No further heroic measures.    Status is: Inpatient Remains inpatient appropriate because: Comfort measures pending hospice placement  Planned Discharge Destination:  Patient is currently on comfort measures.  Time spent: 38 minutes  Author: Artist Beach, MD 12/05/2022 2:53 PM  For on call review [email or web address].

## 2022-12-05 NOTE — Care Plan (Signed)
Patients family opted for comfort measures. PRN opiates were offered but have opted to go with morphine drip.Family discussed with Hospice team. Plan is to initiate morphine drip for comfort ,measures only. Comfort measures protocol with morphine has been initiated per hospice team recommendations.

## 2022-12-05 NOTE — Progress Notes (Signed)
[facility]) Hospice hospital liaison note  Received request from [facility] for family interest in hospice home.   Chart reviewed and hospice home eligibility is confirmed at this time. Have talked with daughter by phone to acknowledge referral and explain services.   Unfortunately hospice home does not have a bed to offer today. TOC is aware hospital liaison will follow up tomorrow or sooner if room becomes available.   Please do not hesitate to call with any hospice related questions or concerns.   Thank you for the opportunity to participate in this patient's care.  Jhonnie Garner, Therapist, sports, [facility] Liaison  [phone]

## 2022-12-06 DIAGNOSIS — A419 Sepsis, unspecified organism: Secondary | ICD-10-CM | POA: Diagnosis not present

## 2022-12-06 DIAGNOSIS — G9341 Metabolic encephalopathy: Secondary | ICD-10-CM | POA: Diagnosis not present

## 2022-12-06 DIAGNOSIS — R6520 Severe sepsis without septic shock: Secondary | ICD-10-CM | POA: Diagnosis not present

## 2022-12-06 LAB — SODIUM: Sodium: 158 mmol/L — ABNORMAL HIGH (ref 135–145)

## 2022-12-06 MED ORDER — MORPHINE 100MG IN NS 100ML (1MG/ML) PREMIX INFUSION: 1.0000 mg/h | INTRAVENOUS | 0 refills | Status: AC

## 2022-12-06 NOTE — Progress Notes (Addendum)
[facility]) Hospital Liaison Note   Patient has been approved for the Hospice Home.    Unfortunately, Hospice Home is not able to offer a room today. Family and [facility] Manager aware hospital liaison will follow up tomorrow or sooner if a room becomes available.    Addendum: 4:05 pm Bed offered to family & accepted. Consents to be completed @ 5 pm.  EMS notified of patient D/C and transport arranged for 6 pm. TOC/Darrian and Attending Physician/Dr. Karleen Hampshire also notified of transport arrangement.    Please send signed DNR form with patient and RN call report to [phone].    Daphene Calamity, MSW [facility] Liaison [phone]

## 2022-12-06 NOTE — Discharge Summary (Signed)
Physician Discharge Summary   Patient: Whitney [name] MRN: [id number] DOB: May 17, 1941  Admit date:     12/01/2022  Discharge date: 12/06/22  Discharge Physician: Hosie Poisson   PCP: Idelle Crouch, MD   Recommendations at discharge:  Please follow up with hospice MD as recommended.   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  She was admitted and treated for severe sepsis.  Clinical course complicated by altered mental status secondary to worsening hypernatremia.  Family have opted to pursue comfort measures as they did not wish to put patient through any heroic measures at this time.    Assessment and Plan:    Comfort measures only: Family do not wish to pursue any heroic measures at this  time.   Severe sepsis ([place]): secondary to complicated urinary tract infection E coli bacteremia isolated.  She presented with Gross hematuria s/p cystoscopy 11/07/2022, with urine cytology negative.History of Staphylococcus haemolyticus UTI 11/07/2022. Severe sepsis criteria includes tachycardia and lactic acidosis, AKI, hyperbilirubinemia and altered mental status.  Altered mental status still persists most likely secondary to hypernatremia.  Family however has decided against any further heroic measures.  They wish for patient to be comfort measures.  Palliative care has been involved with complex decision making.  Antibiotics and fluids will be discontinued per family's wishes.     ABLA (acute blood loss anemia) 2/2 Gross hematuria Hemoglobin 9.5>>8.8.  No further intervention at this time. INR 4.3, s/p IV vit K.  Eliquis was discontinued. GI consulted, signed off.  PPI was ordered.     AKI (acute kidney injury) (HCC)Serum creatinine back at baseline.  Acute kidney injury likely prerenal etiology secondary to dehydration.     Acute metabolic acidosis: Resolved with bicarb replacement treatment.   Hypernatremia Na 149 on presentation and worsening, likely due to free water deficit from no oral hydration.  IV fluids were changed to D5 water.  Family however wishes for all heroic measures to seize due to comfort measures only.  Will DC IV fluids.   Acute metabolic encephalopathy secondary to hypernatremia Patient with confusion and lethargy.Delirium precautions, fall and aspiration precautions   Hepatic cirrhosis with portal hypertensive gastropathy and grade 2 esophageal /Hyperbilirubinemia. Liver lesions  Elevated bilirubin of 3.5 with elevated AST 58.most likely secondary to severe sepsis however CT abdomen and pelvis showing concerning findings as outlined below:  Interval development of subcentimeter hypodensities with concern for hepatocellular carcinoma/metastatic disease.  Based on the  constellation of comorbidities, family has opted not to pursue any further interventions and pursue comfort measures only protocol.    Thrombocytopenia (HCC)    Diabetes mellitus, type II (HCC) A1c 6.2, well controlled.No need for BG checks   Chronic anticoagulation History of unprovoked PE 02/2022 Hold Eliquis -no further heroic measures.   Parkinson's disease Hold all medications at this time   Depression Hold meds while n.p.o.   Protein energy malnutrition      Consultants: GI Procedures performed: none.   Disposition: residential hospice.  Diet recommendation:  Discharge Diet Orders (From admission, onward)     Start     Ordered   12/06/22 0000  Diet - low sodium heart healthy        12/06/22 1350           Comfort feeds.  DISCHARGE MEDICATION: Allergies as of 12/06/2022   No Known Allergies      Medication List     STOP taking these medications    Amantadine HCl 100 MG tablet   apixaban 5 MG Tabs tablet Commonly known as: ELIQUIS   buPROPion 150 MG 24 hr tablet Commonly known as: WELLBUTRIN XL   cetirizine 5 MG tablet Commonly known as: ZYRTEC   cholecalciferol 25 MCG (1000 UNIT) tablet Commonly known as: VITAMIN D3   cyanocobalamin 500 MCG tablet Commonly known as: VITAMIN B12   estradiol 0.1 MG/GM vaginal cream Commonly known as: ESTRACE   ferrous sulfate 325 (65 FE) MG tablet   furosemide 40 MG tablet Commonly known as: LASIX   lidocaine 5 % Commonly known as: LIDODERM   metFORMIN 1000 MG tablet Commonly known as: GLUCOPHAGE   Myrbetriq 50 MG Tb24 tablet Generic drug: mirabegron ER   nitrofurantoin (macrocrystal-monohydrate) 100 MG capsule Commonly known as: MACROBID   pantoprazole 40 MG tablet Commonly known as: PROTONIX   polyethylene glycol 17 g packet Commonly known as: MIRALAX / GLYCOLAX   REFRESH TEARS OP   senna-docusate 8.6-50 MG tablet Commonly known as: Senokot-S   triamcinolone cream 0.1 % Commonly  known as: KENALOG   Trospium Chloride 60 MG Cp24       TAKE these medications    acetaminophen 500 MG tablet Commonly known as: TYLENOL Take 500-1,000 mg by mouth every 6 (six) hours as needed (for pain.).   morphine 1 mg/mL Soln infusion Inject 1 mg/hr into the vein continuous.               Discharge Care Instructions  (From admission, onward)           Start     Ordered   12/06/22 0000  Discharge wound care:       Comments: Wound care as per the instructions.   12/06/22 1350            Discharge Exam: Filed Weights   12/01/22 1553 12/03/22 0200  Weight: 68 kg 56.8 kg   Ill appearing lady , unresponsive. Appears comfortable.   Condition at discharge: fair  The results of significant diagnostics from this hospitalization (including imaging, microbiology, ancillary and laboratory) are listed below for reference.   Imaging Studies: CT RENAL STONE STUDY  Result Date: 12/01/2022 CLINICAL DATA:  Hematuria, urinary tract infection EXAM: CT ABDOMEN AND PELVIS WITHOUT CONTRAST TECHNIQUE: Multidetector CT imaging of the abdomen and pelvis was performed following the standard protocol without IV contrast. Unenhanced CT was performed per clinician  order. Lack of IV contrast limits sensitivity and specificity, especially for evaluation of abdominal/pelvic solid viscera. RADIATION DOSE REDUCTION: This exam was performed according to the departmental dose-optimization program which includes automated exposure control, adjustment of the mA and/or kV according to patient size and/or use of iterative reconstruction technique. COMPARISON:  07/27/2022 FINDINGS: Lower chest: No acute pleural or parenchymal lung disease. Hepatobiliary: Since the previous exam, innumerable subcentimeter hypodensities are seen throughout the liver parenchyma. These are superimposed upon background cirrhosis. No intrahepatic biliary duct dilation. The gallbladder is moderately distended, with  gallbladder sludge layering dependently. No evidence of cholelithiasis or cholecystitis. Pancreas: Unremarkable unenhanced appearance. 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 Otherwise unremarkable unenhanced exam. Adrenals/Urinary Tract: No urinary tract calculi or obstructive uropathy within either kidney. Stable right renal cyst does not require specific imaging follow-up. The adrenals are unremarkable. There is persistent irregular bladder wall thickening, which could be due to previous history of bladder repair. Superimposed cystitis would be difficult to exclude, and correlation with urinalysis is recommended. Stomach/Bowel: No bowel obstruction or ileus. Moderate retained stool throughout the colon. No bowel wall thickening or inflammatory change. Vascular/Lymphatic: Aortic atherosclerosis. No enlarged abdominal or pelvic lymph nodes. Reproductive: Status post hysterectomy. No adnexal masses. Other: No free fluid or free intraperitoneal gas. No abdominal wall hernia. Musculoskeletal: No acute or destructive bony lesions. Chronic compression deformities within the superior endplates of L3 and L4. Reconstructed images demonstrate no additional findings. IMPRESSION: 1. Interval development of innumerable subcentimeter hypodensities throughout the entirety of the liver, superimposed upon chronic background cirrhosis. Differential diagnosis would include multifocal HCC, metastatic disease, atypical presentation of regenerative nodules in cirrhosis, or less likely infectious etiologies to include fungal organisms. Further evaluation with dedicated liver MRI may be useful when the patient is able to clinically tolerate breath hold instructions. 2. Persistent bladder wall thickening. This may be in part due to history of prior bladder repair, though superimposed cystitis cannot be completely excluded. Please correlate with urinalysis. 3. No urinary tract calculi or obstructive  uropathy. 4. Moderate fecal retention consistent with constipation. No bowel obstruction or ileus. 5.  Aortic Atherosclerosis (ICD10-I70.0). Electronically Signed   By: Randa Ngo M.D.   On: 12/01/2022 21:12   DG Chest Port 1 View  Result Date: 12/01/2022 CLINICAL DATA:  Questionable sepsis EXAM: PORTABLE CHEST 1 VIEW COMPARISON:  Chest x-ray dated September 09, 2022 FINDINGS: Cardiac and mediastinal contours within normal limits. Electronic device overlying the upper mediastinum. Lungs are clear. No evidence of pleural effusion or pneumothorax IMPRESSION: No active disease. Electronically Signed   By: Yetta Glassman M.D.   On: 12/01/2022 16:41    Microbiology: Results for orders placed or performed during the hospital encounter of 12/01/22  Blood Culture (routine x 2)     Status: Abnormal   Collection Time: 12/01/22  4:05 PM   Specimen: BLOOD LEFT ARM  Result Value Ref Range Status   Specimen Description   Final    BLOOD LEFT ARM Performed at [facility], [address], [place], [place] [address]    Special Requests   Final    BOTTLES DRAWN AEROBIC AND ANAEROBIC Blood Culture adequate volume Performed at [facility], [address] [place], [place] [address]    Culture  Setup Time   Final    GRAM NEGATIVE RODS ANAEROBIC BOTTLE ONLY CRITICAL RESULT CALLED TO, READ BACK BY AND VERIFIED WITH: JASON ROBBINS AT 7026 12/02/22.PMF Performed at [facility], [place]  [address] [place], [place] [address]    Culture ESCHERICHIA COLI (A)  Final   Report Status 12/04/2022 FINAL  Final   Organism ID, Bacteria ESCHERICHIA COLI  Final      Susceptibility   Escherichia coli - MIC*    AMPICILLIN >=32 RESISTANT Resistant     CEFAZOLIN <=4 SENSITIVE Sensitive     CEFEPIME <=0.12 SENSITIVE Sensitive     CEFTAZIDIME <=1 SENSITIVE Sensitive     CEFTRIAXONE <=0.25 SENSITIVE Sensitive     CIPROFLOXACIN <=0.25 SENSITIVE Sensitive     GENTAMICIN >=16 RESISTANT Resistant      IMIPENEM <=0.25 SENSITIVE Sensitive     TRIMETH/SULFA >=320 RESISTANT Resistant     AMPICILLIN/SULBACTAM 16 INTERMEDIATE Intermediate     PIP/TAZO <=4 SENSITIVE Sensitive     * ESCHERICHIA COLI  Blood Culture ID Panel (Reflexed)     Status: Abnormal   Collection Time: 12/01/22  4:05 PM  Result Value Ref Range Status   Enterococcus faecalis NOT DETECTED NOT DETECTED Final   Enterococcus Faecium NOT DETECTED NOT DETECTED Final   Listeria monocytogenes NOT DETECTED NOT DETECTED Final   Staphylococcus species NOT DETECTED NOT DETECTED Final   Staphylococcus aureus (BCID) NOT DETECTED NOT DETECTED Final   Staphylococcus epidermidis NOT DETECTED NOT DETECTED Final   Staphylococcus lugdunensis NOT DETECTED NOT DETECTED Final   Streptococcus species NOT DETECTED NOT DETECTED Final   Streptococcus agalactiae NOT DETECTED NOT DETECTED Final   Streptococcus pneumoniae NOT DETECTED NOT DETECTED Final   Streptococcus pyogenes NOT DETECTED NOT DETECTED Final   A.calcoaceticus-baumannii NOT DETECTED NOT DETECTED Final   Bacteroides fragilis NOT DETECTED NOT DETECTED Final   Enterobacterales DETECTED (A) NOT DETECTED Final    Comment: Enterobacterales represent a large order of gram negative bacteria, not a single organism. CRITICAL RESULT CALLED TO, READ BACK BY AND VERIFIED WITH: JASON ROBBINS AT 0737 12/02/22.PMF    Enterobacter cloacae complex NOT DETECTED NOT DETECTED Final   Escherichia coli DETECTED (A) NOT DETECTED Final    Comment: CRITICAL RESULT CALLED TO, READ BACK BY AND VERIFIED WITH: JASON ROBBINS AT 1062 12/02/22.PMF    Klebsiella aerogenes NOT DETECTED NOT DETECTED Final   Klebsiella oxytoca NOT DETECTED NOT DETECTED Final   Klebsiella pneumoniae NOT DETECTED NOT DETECTED Final   Proteus species NOT DETECTED NOT DETECTED Final   Salmonella species NOT DETECTED NOT DETECTED Final   Serratia marcescens NOT DETECTED NOT DETECTED Final   Haemophilus influenzae NOT DETECTED NOT  DETECTED Final   Neisseria meningitidis NOT DETECTED NOT DETECTED Final   Pseudomonas aeruginosa NOT DETECTED NOT DETECTED Final   Stenotrophomonas maltophilia NOT DETECTED NOT DETECTED Final   Candida albicans NOT DETECTED NOT DETECTED Final   Candida auris NOT DETECTED NOT DETECTED Final   Candida glabrata NOT DETECTED NOT DETECTED Final   Candida krusei NOT DETECTED NOT DETECTED Final   Candida parapsilosis NOT DETECTED NOT DETECTED Final   Candida tropicalis NOT DETECTED NOT DETECTED Final   Cryptococcus neoformans/gattii NOT DETECTED NOT DETECTED Final   CTX-M ESBL NOT DETECTED NOT DETECTED Final   Carbapenem resistance IMP NOT DETECTED NOT DETECTED Final   Carbapenem resistance KPC NOT DETECTED NOT DETECTED Final   Carbapenem resistance NDM NOT DETECTED NOT DETECTED Final   Carbapenem resist OXA 48 LIKE NOT DETECTED NOT DETECTED Final   Carbapenem resistance VIM NOT DETECTED NOT DETECTED Final    Comment: Performed at [facility], [address], [place], [place] [address]  Urine Culture     Status: Abnormal  Collection Time: 12/01/22  4:21 PM   Specimen: In/Out Cath Urine  Result Value Ref Range Status   Specimen Description   Final    IN/OUT CATH URINE Performed at [facility], [address] [place], [place] [address]    Special Requests   Final    NONE Performed at [facility], [address] [place], [place] [address]    Culture >=100,000 COLONIES/mL ESCHERICHIA COLI (A)  Final   Report Status 12/04/2022 FINAL  Final   Organism ID, Bacteria ESCHERICHIA COLI (A)  Final      Susceptibility   Escherichia coli - MIC*    AMPICILLIN >=32 RESISTANT Resistant     CEFAZOLIN <=4 SENSITIVE Sensitive     CEFEPIME <=0.12 SENSITIVE Sensitive     CEFTRIAXONE <=0.25 SENSITIVE Sensitive     CIPROFLOXACIN <=0.25 SENSITIVE Sensitive     GENTAMICIN >=16 RESISTANT Resistant     IMIPENEM <=0.25 SENSITIVE Sensitive     NITROFURANTOIN <=16 SENSITIVE  Sensitive     TRIMETH/SULFA >=320 RESISTANT Resistant     AMPICILLIN/SULBACTAM 16 INTERMEDIATE Intermediate     PIP/TAZO <=4 SENSITIVE Sensitive     * >=100,000 COLONIES/mL ESCHERICHIA COLI  Resp panel by RT-PCR (RSV, Flu A&B, Covid) Anterior Nasal Swab     Status: None   Collection Time: 12/01/22  4:21 PM   Specimen: Anterior Nasal Swab  Result Value Ref Range Status   SARS Coronavirus 2 by RT PCR NEGATIVE NEGATIVE Final    Comment: (NOTE) SARS-CoV-2 target nucleic acids are NOT DETECTED.  The SARS-CoV-2 RNA is generally detectable in upper respiratory specimens during the acute phase of infection. The lowest concentration of SARS-CoV-2 viral copies this assay can detect is 138 copies/mL. A negative result does not preclude SARS-Cov-2 infection and should not be used as the sole basis for treatment or other patient management decisions. A negative result may occur with  improper specimen collection/handling, submission of specimen other than nasopharyngeal swab, presence of viral mutation(s) within the areas targeted by this assay, and inadequate number of viral copies(<138 copies/mL). A negative result must be combined with clinical observations, patient history, and epidemiological information. The expected result is Negative.  Fact Sheet for Patients:  EntrepreneurPulse.com.au  Fact Sheet for Healthcare Providers:  IncredibleEmployment.be  This test is no t yet approved or cleared by the [place] FDA and  has been authorized for detection and/or diagnosis of SARS-CoV-2 by FDA under an Emergency Use Authorization (EUA). This EUA will remain  in effect (meaning this test can be used) for the duration of the COVID-19 declaration under Section 564(b)(1) of the Act, 21 U.S.C.section 360bbb-3(b)(1), unless the authorization is terminated  or revoked sooner.       Influenza A by PCR NEGATIVE NEGATIVE Final   Influenza B by PCR NEGATIVE  NEGATIVE Final    Comment: (NOTE) The Xpert Xpress SARS-CoV-2/FLU/RSV plus assay is intended as an aid in the diagnosis of influenza from Nasopharyngeal swab specimens and should not be used as a sole basis for treatment. Nasal washings and aspirates are unacceptable for Xpert Xpress SARS-CoV-2/FLU/RSV testing.  Fact Sheet for Patients: EntrepreneurPulse.com.au  Fact Sheet for Healthcare Providers: IncredibleEmployment.be  This test is not yet approved or cleared by the [place] FDA and has been authorized for detection and/or diagnosis of SARS-CoV-2 by FDA under an Emergency Use Authorization (EUA). This EUA will remain in effect (meaning this test can be used) for the duration of the COVID-19 declaration under Section 564(b)(1) of  the Act, 21 U.S.C. section 360bbb-3(b)(1), unless the authorization is terminated or revoked.     Resp Syncytial Virus by PCR NEGATIVE NEGATIVE Final    Comment: (NOTE) Fact Sheet for Patients: EntrepreneurPulse.com.au  Fact Sheet for Healthcare Providers: IncredibleEmployment.be  This test is not yet approved or cleared by the [place] FDA and has been authorized for detection and/or diagnosis of SARS-CoV-2 by FDA under an Emergency Use Authorization (EUA). This EUA will remain in effect (meaning this test can be used) for the duration of the COVID-19 declaration under Section 564(b)(1) of the Act, 21 U.S.C. section 360bbb-3(b)(1), unless the authorization is terminated or revoked.  Performed at [facility], [address] [place], [place] [address]   Blood Culture (routine x 2)     Status: Abnormal   Collection Time: 12/01/22  5:44 PM   Specimen: BLOOD RIGHT ARM  Result Value Ref Range Status   Specimen Description   Final    BLOOD RIGHT ARM Performed at [facility], [place] [address] [place], [place] [address]    Special Requests   Final     BOTTLES DRAWN AEROBIC AND ANAEROBIC Blood Culture results may not be optimal due to an excessive volume of blood received in culture bottles Performed at [facility], [address] [place], [place] [address]    Culture  Setup Time   Final    GRAM NEGATIVE RODS IN BOTH AEROBIC AND ANAEROBIC BOTTLES CRITICAL RESULT CALLED TO, READ BACK BY AND VERIFIED WITH: JASON ROBBINS AT 8502 12/02/22.PMF Performed at [facility], [address] [place], [place] [address]    Culture (A)  Final    ESCHERICHIA COLI SUSCEPTIBILITIES PERFORMED ON PREVIOUS CULTURE WITHIN THE LAST 5 DAYS. Performed at [facility], [place] [address], [place], [place] [address]    Report Status 12/04/2022 FINAL  Final    Labs: CBC: Recent Labs  Lab 12/01/22 1605 12/01/22 2138 12/02/22 0259 12/03/22 0447 12/04/22 0626 12/05/22 0525  WBC 7.2  --  5.2 6.7 6.7 5.4  NEUTROABS 5.5  --   --   --   --   --   HGB 9.5* 8.3* 8.1* 8.7* 8.6* 8.8*  HCT 29.9*  --  25.2* 27.0* 26.4* 27.6*  MCV 98.0  --  97.7 97.8 97.1 100.0  PLT 139*  --  100* 90* 76* 51*   Basic Metabolic Panel: Recent Labs  Lab 12/01/22 1605 12/02/22 0259 12/03/22 0447 12/04/22 0626 12/05/22 0525 12/05/22 0850 12/05/22 1419 12/05/22 2056 12/06/22 0218  NA 149* 147* 154* 156* 158* 158* 156* 159* 158*  K 4.6 4.8 4.3 3.6 3.4*  --   --   --   --   CL 118* 118* 125* 122* 122*  --   --   --   --   CO2 11* 15* 17* 23 30  --   --   --   --   GLUCOSE 71 76 82 160* 186*  --   --   --   --   BUN 69* 69* 60* 47* 36*  --   --   --   --   CREATININE 2.01* 1.96* 1.62* 1.27* 0.91  --   --   --   --   CALCIUM 8.5* 7.8* 7.9* 7.9* 7.5*  --   --   --   --   MG  --   --  1.8 1.9 1.8  --   --   --   --  PHOS  --   --   --   --  1.6*  --   --   --   --    Liver Function Tests: Recent Labs  Lab 12/01/22 1605 12/02/22 0259  AST 58* 50*  ALT 40 36  ALKPHOS 124 100  BILITOT 3.5* 3.3*  PROT 6.4* 5.5*  ALBUMIN 2.6* 2.1*   CBG: No  results for input(s): "GLUCAP" in the last 168 hours.  Discharge time spent: 36 minutes.   Signed: Hosie Poisson, MD [facility] 12/06/2022

## 2022-12-06 NOTE — TOC Transition Note (Signed)
Transition of Care [facility]) - CM/SW Discharge Note   Patient Details  Name: Whitney [name] MRN: [id number] Date of Birth: 25-Dec-1940  [facility] of [facility] CM/SW Contact:  Gerilyn [place], LCSW Phone Number: 12/06/2022, 3:27 PM   Clinical Narrative:  Pt accepted at [facility] [place]. RN notified to get DNR signed, med necessity printed to unit. RN given number to call report. [facility] hospice to arrange transport. DC orders in.     Final next level of care: [place] Barriers to Discharge: Barriers Resolved   Patient Goals and CMS Choice   Choice offered to / list presented to : Patient, [facility] POA / Guardian  Discharge Placement                Patient chooses bed at:  ([facility] hospice)        Discharge Plan and Services Additional resources added to the After Visit Summary for                                       Social Determinants of Health (SDOH) Interventions SDOH Screenings   Food Insecurity: No Food Insecurity (05/26/2020)  Housing: Low Risk  (05/26/2020)  Transportation Needs: No Transportation Needs (05/26/2020)  Alcohol Screen: Low Risk  (11/15/2020)  Depression (PHQ2-9): Low Risk  (11/15/2020)  Financial Resource Strain: Low Risk  (05/26/2020)  Physical Activity: Inactive (05/26/2020)  Social Connections: Socially Isolated (05/26/2020)  Stress: No Stress Concern Present (05/26/2020)  Tobacco Use: Medium Risk (12/01/2022)     Readmission Risk Interventions    03/02/2022    1:31 PM  Readmission Risk Prevention Plan  Transportation Screening Complete  PCP or Specialist Appt within 3-5 Days Complete  HRI or [place] Complete  Social Work Consult for [place] Planning/Counseling Complete  Palliative Care Screening Not Applicable  Medication Review Press photographer) Complete

## 2022-12-06 NOTE — Care Management Important Message (Signed)
Important Message  Patient Details  Name: Whitney [name] MRN: [id number] Date of Birth: 02-26-1941   Medicare Important Message Given:  Other (see comment)  Disposition to discharge to hospice home pending bed availability.  Medicare IM withheld at this time out of respect for patient and family.    Dannette Barbara 12/06/2022, 9:10 AM

## 2022-12-06 NOTE — Progress Notes (Signed)
Nutrition Brief Note  Chart reviewed. Pt now transitioning to comfort care.  No further nutrition interventions planned at this time.  Please re-consult as needed.   Ileigh Mettler W, RD, LDN, CDCES Registered Dietitian II Certified Diabetes Care and Education Specialist Please refer to AMION for RD and/or RD on-call/weekend/after hours pager   

## 2022-12-10 DEATH — deceased

## 2022-12-17 NOTE — Telephone Encounter (Signed)
Called to schedule surgery. Patient is deceased as of 2022/12/26 per [name], also see [facility] note from 12/11/2022. I have removed from surgery scheduling inbasket. Marked as deceased in computer.

## 2022-12-17 NOTE — Progress Notes (Signed)
Patient is deceased as of 13-Dec-2022. Removed from Surgery Coordinator basket and cancelled future appts.

## 2023-01-30 ENCOUNTER — Other Ambulatory Visit: Payer: Medicare PPO | Admitting: Urology

## 2023-04-17 ENCOUNTER — Other Ambulatory Visit: Payer: Medicare PPO

## 2023-04-18 ENCOUNTER — Ambulatory Visit: Payer: Medicare PPO | Admitting: Oncology

## 2023-04-18 ENCOUNTER — Ambulatory Visit: Payer: Medicare PPO

## 2023-04-22 ENCOUNTER — Ambulatory Visit: Payer: Medicare PPO | Admitting: Oncology

## 2023-04-22 ENCOUNTER — Ambulatory Visit: Payer: Medicare PPO

## 2023-05-02 IMAGING — CT CT T SPINE W/O CM
3 of 4 series · 12 of 35 positions shown, 14 images · non-contrast
Comparison: CT cervical spine 07/20/2021

CLINICAL DATA: Back pain status post fall.

EXAM:
CT THORACIC SPINE WITHOUT CONTRAST
TECHNIQUE: Multidetector CT images of the thoracic were obtained using the
standard protocol without intravenous contrast.
RADIATION DOSE REDUCTION: This exam was performed according to the
departmental dose-optimization program which includes automated
exposure control, adjustment of the mA and/or kV according to
patient size and/or use of iterative reconstruction technique.

[Series 5: coronal bone · coronal · 0.61mm/px · 3 of 155 slices shown]
[im 31/155  bone]
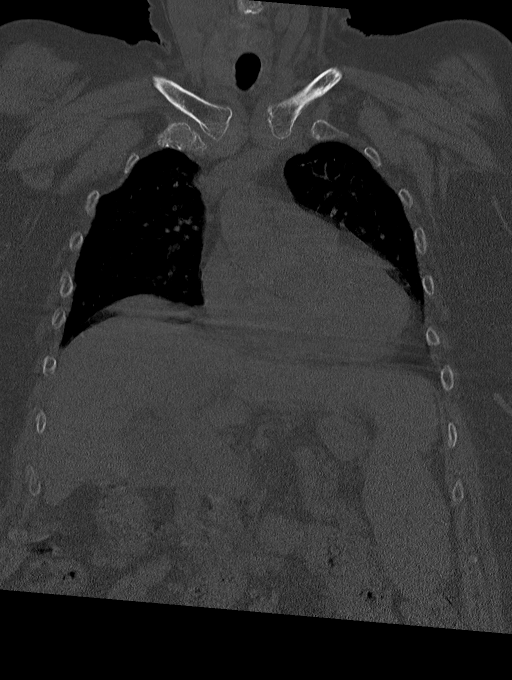
[im 62/155  bone]
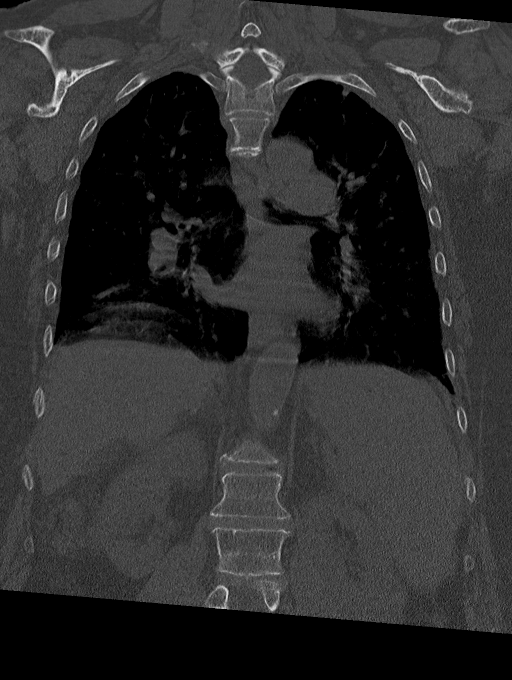
[im 93/155  bone]
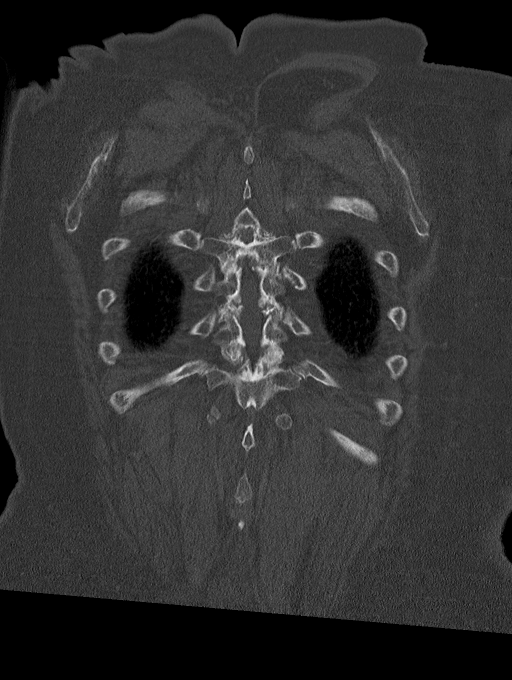

[Series 6: sagittal bone · sagittal · 0.44mm/px · 5 of 257 slices shown, 6 images]
[im 86/257  bone]
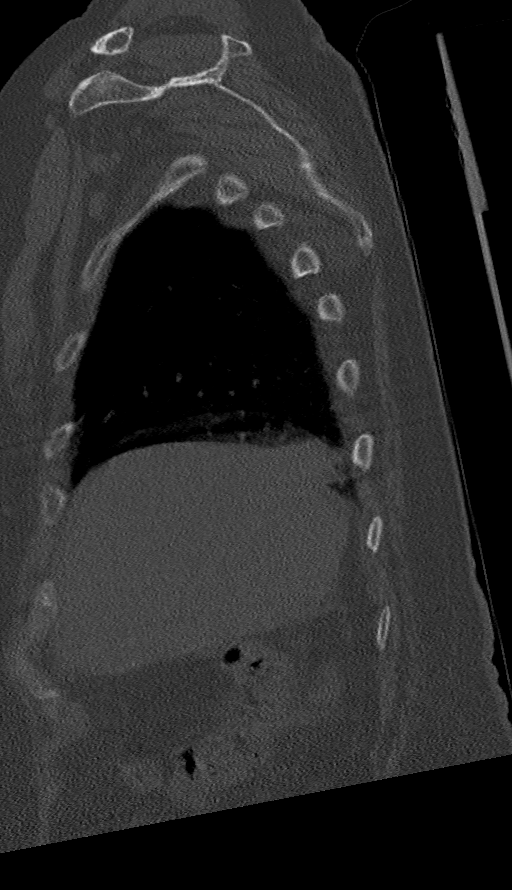
[im 107/257  bone]
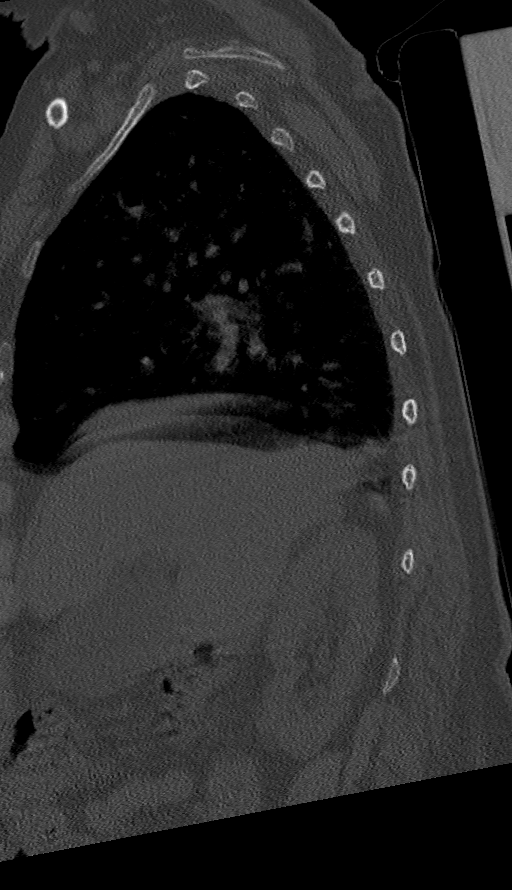
[im 129/257  soft-tissue]
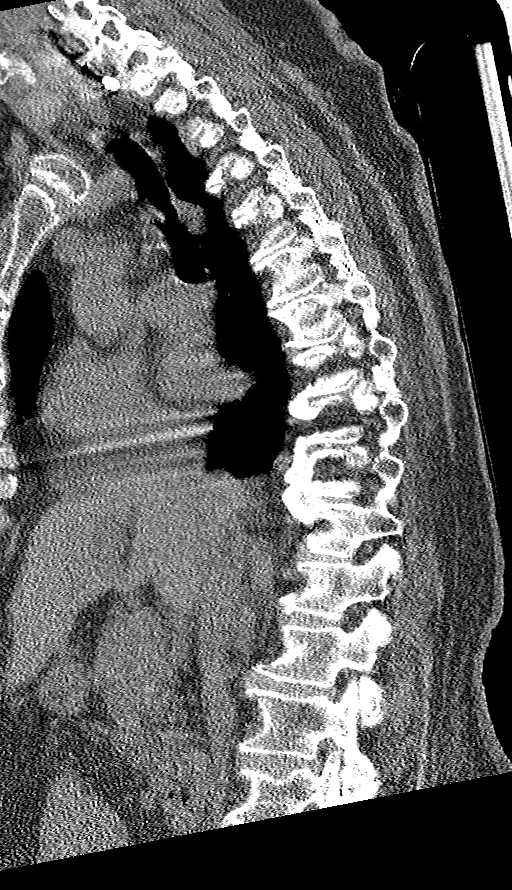
[im 129/257  bone]
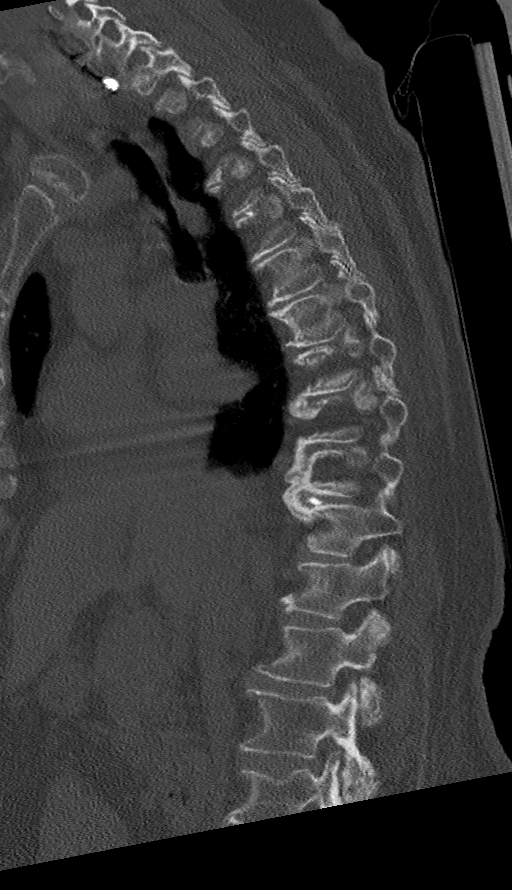
[im 150/257  bone]
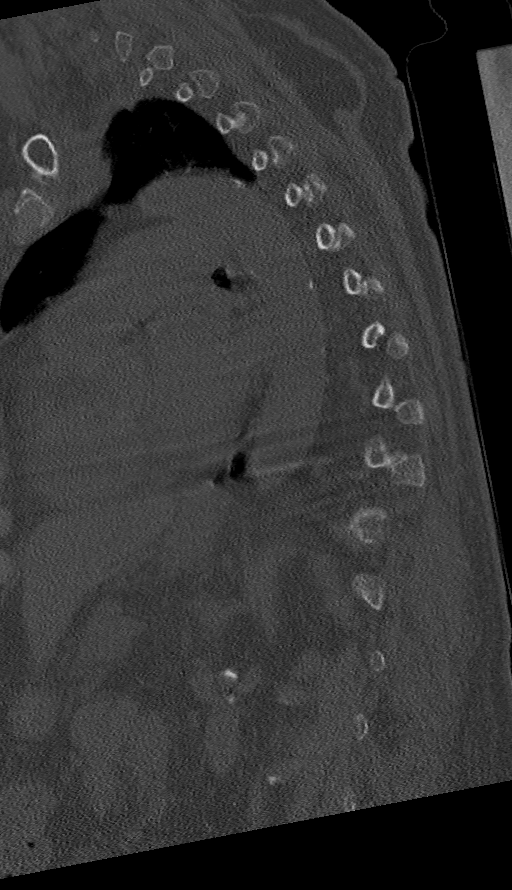
[im 171/257  bone]
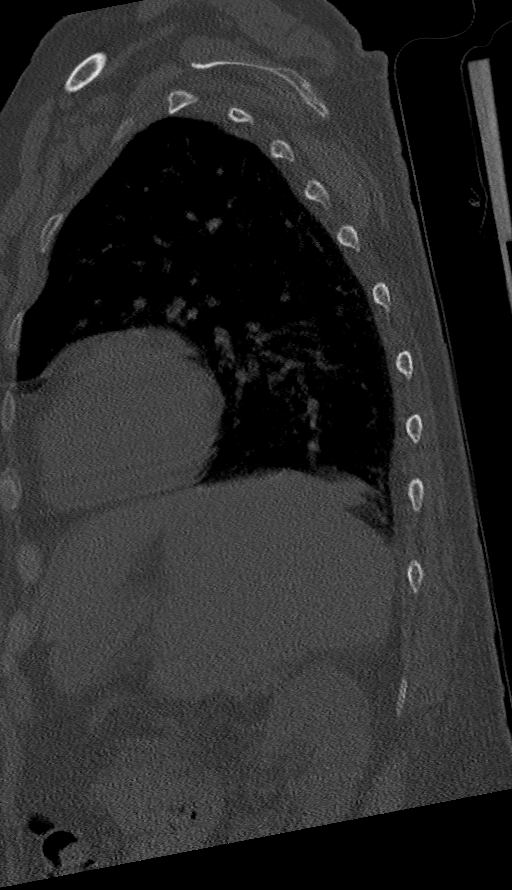

[Series 7: multidisc · axial · 0.21mm/px · z∈[+437,+656]mm · 4 of 198 slices shown, 5 images]
[im 33/198  soft-tissue]
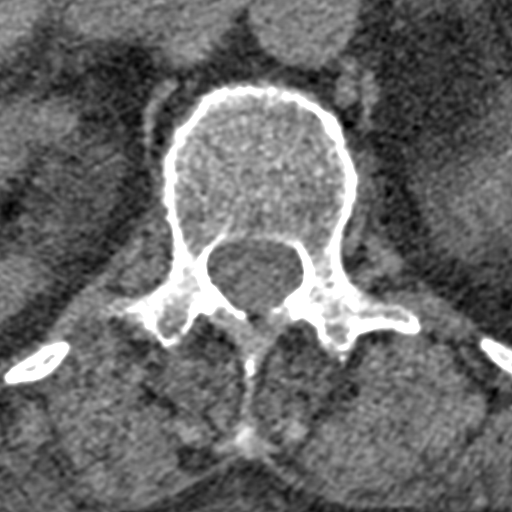
[im 33/198  bone]
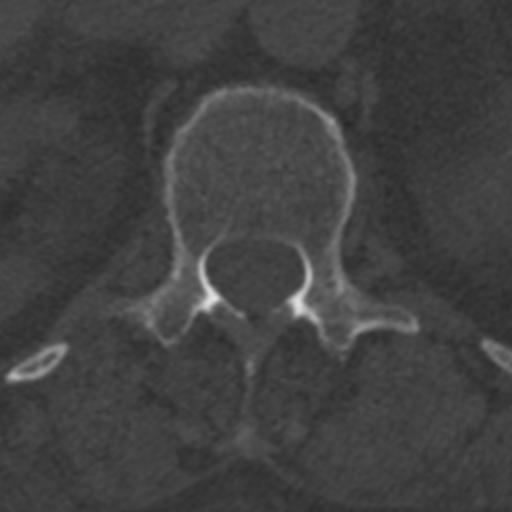
[im 66/198  bone]
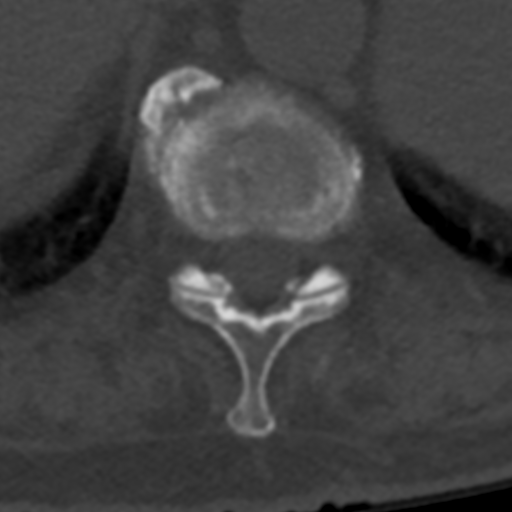
[im 132/198  bone]
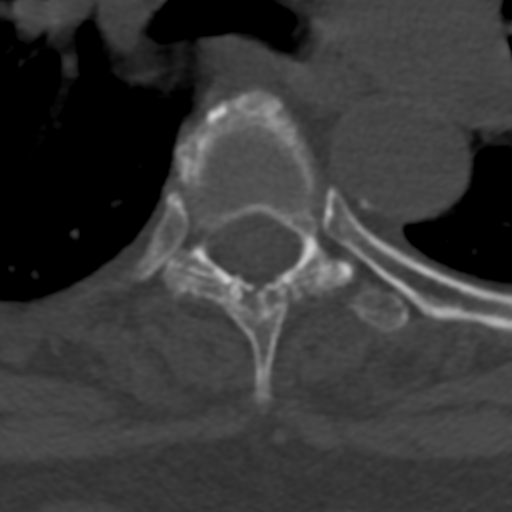
[im 165/198  bone]
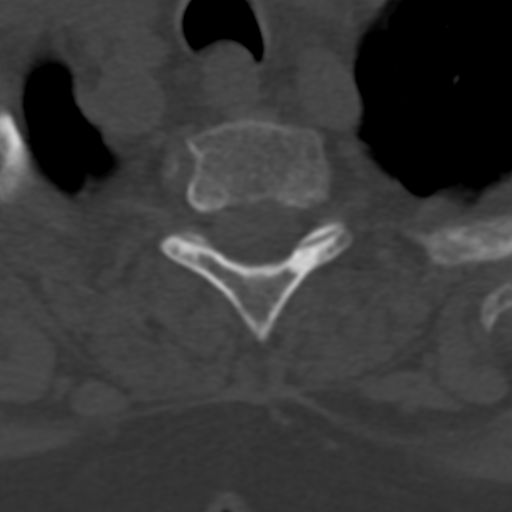

[12 of 35 positions shown; findings below may reference images not displayed]

FINDINGS: Alignment: Normal.

Vertebrae: Acute L3 vertebral body compression fracture with
approximately 10% height loss. Mild chronic T1 vertebral body
compression fracture. Remainder the vertebral body heights are
maintained. No other acute fracture. No discitis or osteomyelitis.
No aggressive osseous lesion.

Paraspinal and other soft tissues: No acute paraspinal abnormality.
Visualized right lung is clear. Stable cardiomegaly partially
visualized. Thoracic aortic atherosclerosis.

Disc levels: Degenerative disease with disc height loss at T6-7,
T7-8, T8-9, T9-10, T10-11 and T11-12. No foraminal or central canal
stenosis. Anterior cervical fusion from C5 through C7.
IMPRESSION: 1. Acute L3 vertebral body compression fracture with approximately
10% height loss.
2. Mild chronic T1 vertebral body compression fracture.
3. Aortic Atherosclerosis ([id number]).

## 2023-05-02 IMAGING — DX DG HUMERUS 2V *L*
2 series · 2 of 2 positions shown · non-contrast
Comparison: Humerus radiographs 11/21/2018

CLINICAL DATA: Pain, fall

EXAM:
LEFT HUMERUS - 2+ VIEW

[humerus ap]
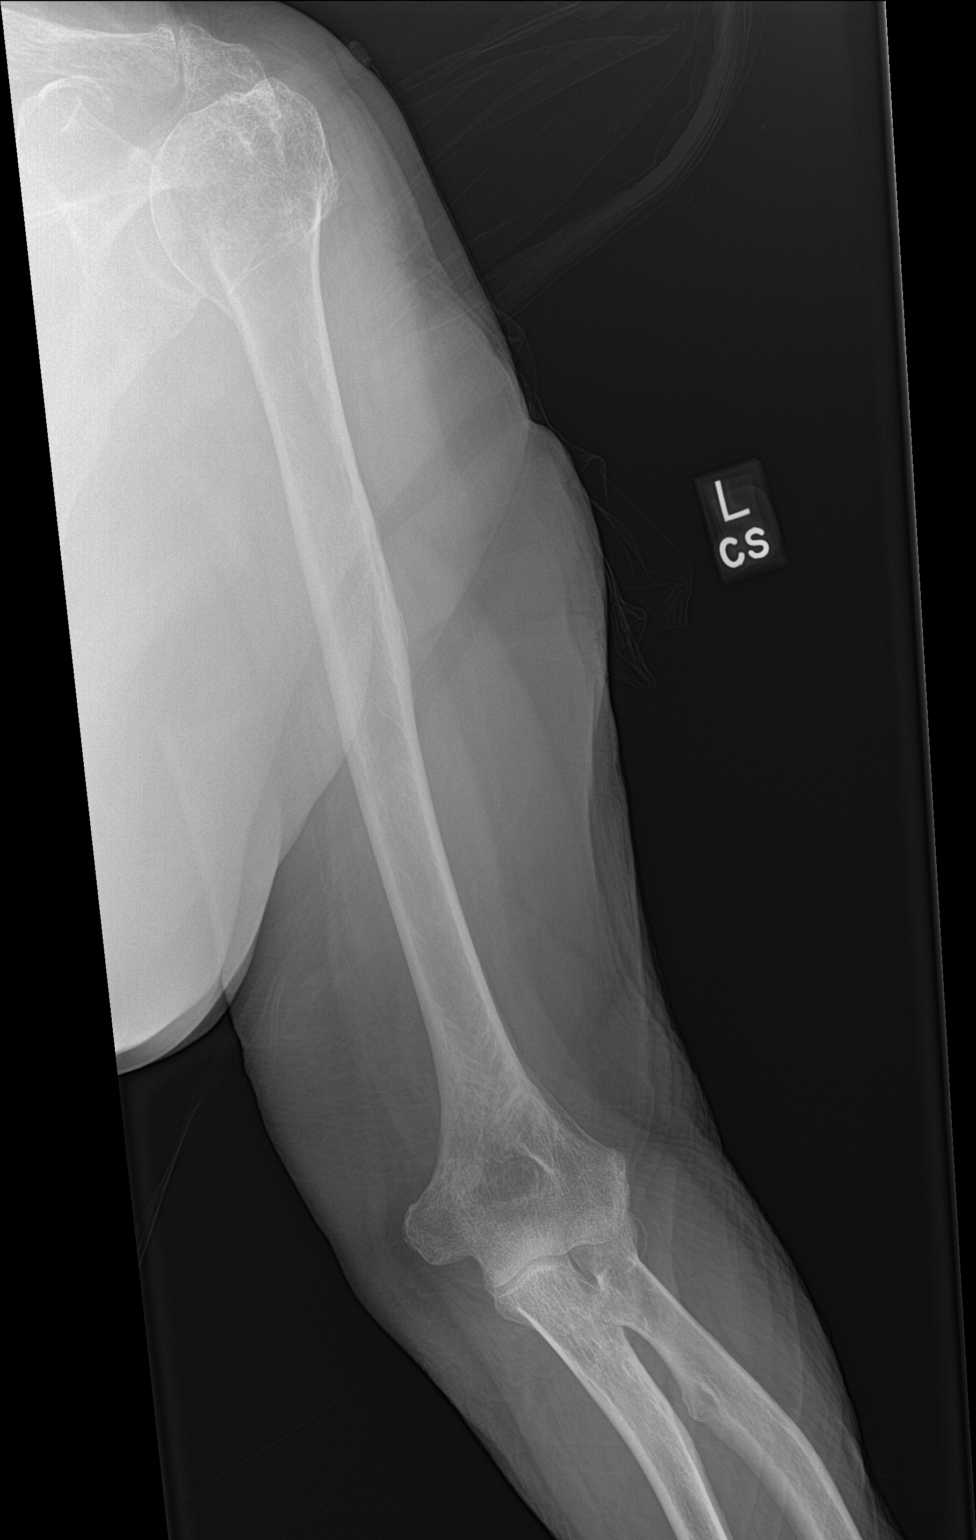

[humerus lat]
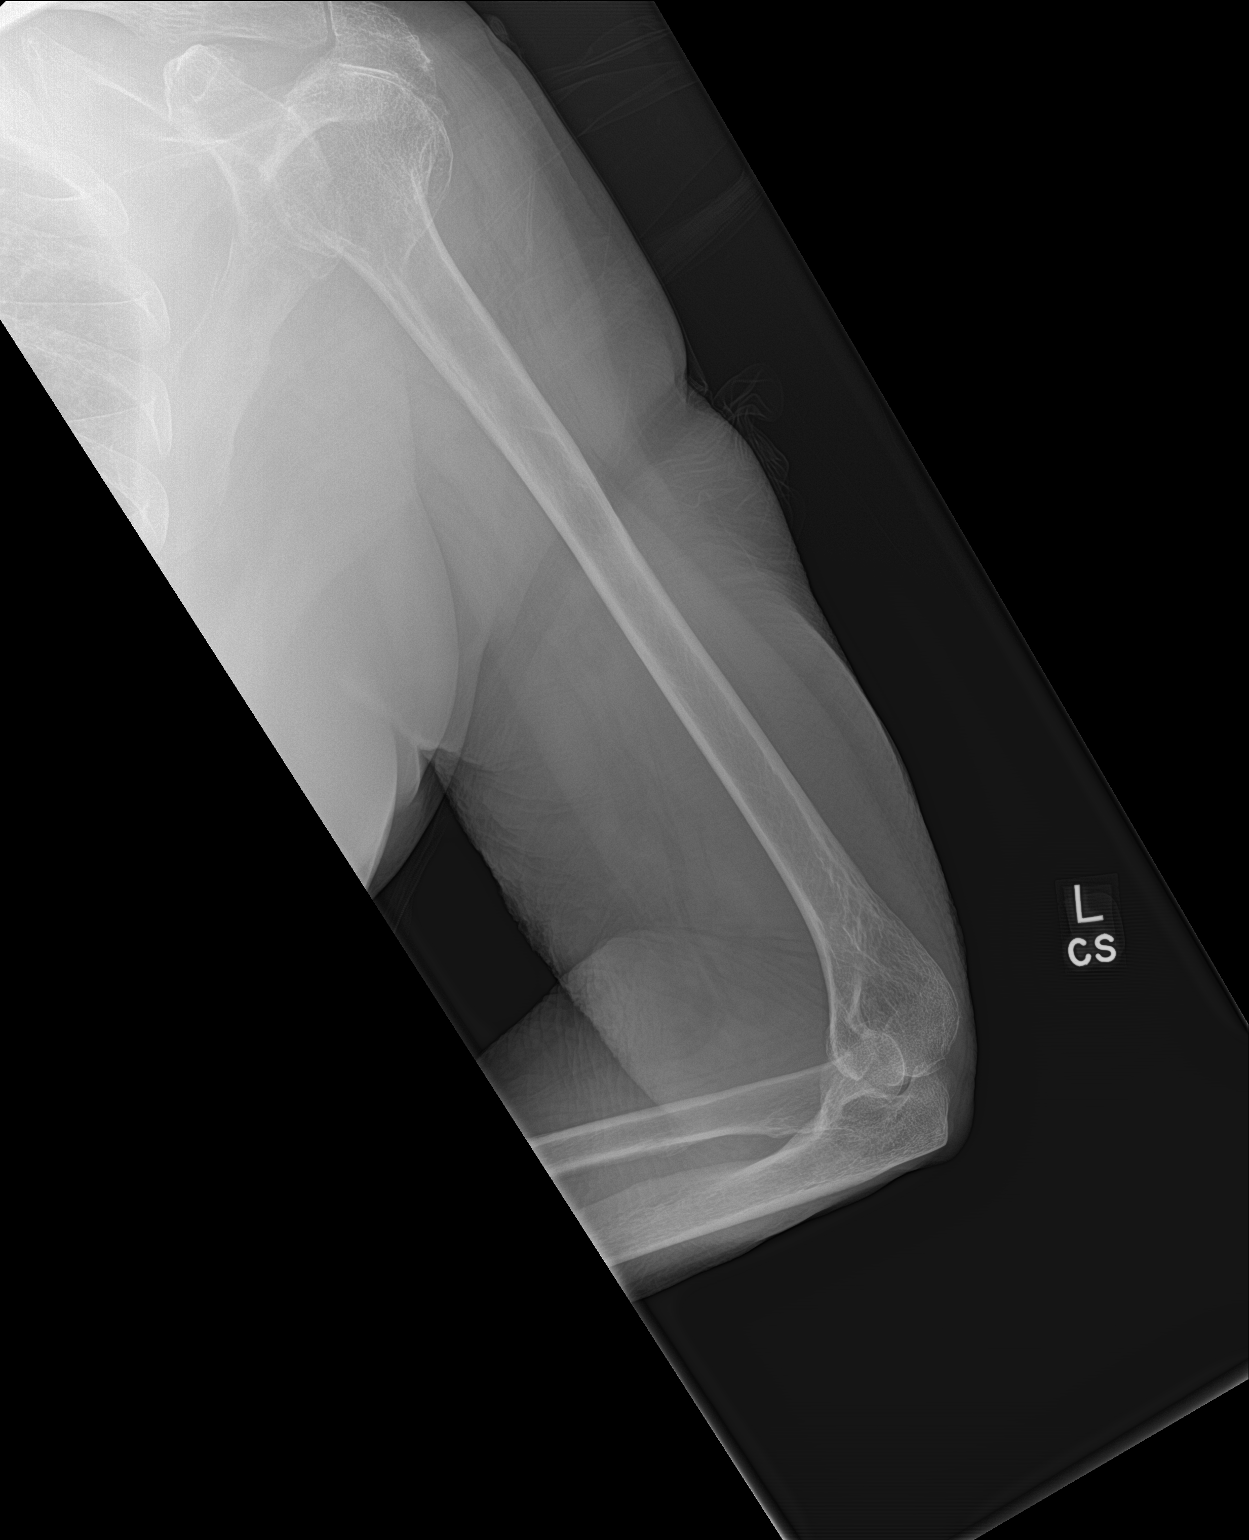

[2 of 2 positions shown; findings below may reference images not displayed]

FINDINGS: There is deformity of the proximal left humerus which is similar in
appearance to the shoulder radiographs from 11/21/2018, consistent
with prior fracture. There is no evidence of acute humeral fracture.
Shoulder and elbow alignment appear maintained. The soft tissues are
unremarkable.
IMPRESSION: Deformity of the proximal left humerus is similar in appearance to
the prior shoulder radiographs from [id number], consistent with prior
fracture. No evidence of acute humeral fracture.
